# Patient Record
Sex: Female | Born: 1970 | Race: Black or African American | Hispanic: No | Marital: Single | Smoking: Never smoker
Health system: Southern US, Community
[De-identification: ages and names within clinical notes are randomized; demographics above are authoritative.]

## PROBLEM LIST (undated history)

## (undated) DIAGNOSIS — E559 Vitamin D deficiency, unspecified: Secondary | ICD-10-CM

## (undated) DIAGNOSIS — R0600 Dyspnea, unspecified: Secondary | ICD-10-CM

## (undated) DIAGNOSIS — M1990 Unspecified osteoarthritis, unspecified site: Secondary | ICD-10-CM

## (undated) DIAGNOSIS — R7303 Prediabetes: Secondary | ICD-10-CM

## (undated) DIAGNOSIS — M2550 Pain in unspecified joint: Secondary | ICD-10-CM

## (undated) DIAGNOSIS — E538 Deficiency of other specified B group vitamins: Secondary | ICD-10-CM

## (undated) DIAGNOSIS — M797 Fibromyalgia: Secondary | ICD-10-CM

## (undated) DIAGNOSIS — T7840XA Allergy, unspecified, initial encounter: Secondary | ICD-10-CM

## (undated) DIAGNOSIS — E669 Obesity, unspecified: Secondary | ICD-10-CM

## (undated) DIAGNOSIS — D509 Iron deficiency anemia, unspecified: Secondary | ICD-10-CM

## (undated) DIAGNOSIS — G5710 Meralgia paresthetica, unspecified lower limb: Secondary | ICD-10-CM

## (undated) DIAGNOSIS — D259 Leiomyoma of uterus, unspecified: Secondary | ICD-10-CM

## (undated) DIAGNOSIS — D649 Anemia, unspecified: Secondary | ICD-10-CM

## (undated) DIAGNOSIS — M549 Dorsalgia, unspecified: Secondary | ICD-10-CM

## (undated) DIAGNOSIS — F419 Anxiety disorder, unspecified: Secondary | ICD-10-CM

## (undated) DIAGNOSIS — K529 Noninfective gastroenteritis and colitis, unspecified: Secondary | ICD-10-CM

## (undated) DIAGNOSIS — K259 Gastric ulcer, unspecified as acute or chronic, without hemorrhage or perforation: Secondary | ICD-10-CM

## (undated) DIAGNOSIS — R110 Nausea: Secondary | ICD-10-CM

## (undated) DIAGNOSIS — E739 Lactose intolerance, unspecified: Secondary | ICD-10-CM

## (undated) DIAGNOSIS — K429 Umbilical hernia without obstruction or gangrene: Secondary | ICD-10-CM

## (undated) DIAGNOSIS — I10 Essential (primary) hypertension: Secondary | ICD-10-CM

## (undated) HISTORY — DX: Gastric ulcer, unspecified as acute or chronic, without hemorrhage or perforation: K25.9

## (undated) HISTORY — DX: Deficiency of other specified B group vitamins: E53.8

## (undated) HISTORY — DX: Leiomyoma of uterus, unspecified: D25.9

## (undated) HISTORY — DX: Umbilical hernia without obstruction or gangrene: K42.9

## (undated) HISTORY — DX: Obesity, unspecified: E66.9

## (undated) HISTORY — DX: Vitamin D deficiency, unspecified: E55.9

## (undated) HISTORY — DX: Lactose intolerance, unspecified: E73.9

## (undated) HISTORY — DX: Anxiety disorder, unspecified: F41.9

## (undated) HISTORY — DX: Essential (primary) hypertension: I10

## (undated) HISTORY — DX: Pain in unspecified joint: M25.50

## (undated) HISTORY — DX: Iron deficiency anemia, unspecified: D50.9

## (undated) HISTORY — DX: Noninfective gastroenteritis and colitis, unspecified: K52.9

## (undated) HISTORY — DX: Fibromyalgia: M79.7

## (undated) HISTORY — DX: Anemia, unspecified: D64.9

## (undated) HISTORY — DX: Nausea: R11.0

## (undated) HISTORY — PX: ESOPHAGOGASTRODUODENOSCOPY ENDOSCOPY: SHX5814

## (undated) HISTORY — DX: Allergy, unspecified, initial encounter: T78.40XA

## (undated) HISTORY — DX: Dorsalgia, unspecified: M54.9

## (undated) HISTORY — PX: BREAST REDUCTION SURGERY: SHX8

## (undated) HISTORY — DX: Meralgia paresthetica, unspecified lower limb: G57.10

## (undated) HISTORY — PX: COLONOSCOPY: SHX174

---

## 2002-08-04 HISTORY — PX: MYOMECTOMY: SHX85

## 2005-11-15 ENCOUNTER — Emergency Department (HOSPITAL_COMMUNITY): Admission: EM | Admit: 2005-11-15 | Discharge: 2005-11-15 | Payer: Self-pay | Admitting: Family Medicine

## 2006-01-13 ENCOUNTER — Observation Stay (HOSPITAL_COMMUNITY): Admission: EM | Admit: 2006-01-13 | Discharge: 2006-01-14 | Payer: Self-pay | Admitting: Emergency Medicine

## 2006-02-02 ENCOUNTER — Encounter: Payer: Self-pay | Admitting: Internal Medicine

## 2006-04-17 ENCOUNTER — Ambulatory Visit: Payer: Self-pay | Admitting: Internal Medicine

## 2006-05-29 ENCOUNTER — Ambulatory Visit: Payer: Self-pay | Admitting: Internal Medicine

## 2006-07-15 ENCOUNTER — Ambulatory Visit: Payer: Self-pay | Admitting: Internal Medicine

## 2006-08-05 ENCOUNTER — Ambulatory Visit: Payer: Self-pay | Admitting: Internal Medicine

## 2006-09-16 ENCOUNTER — Ambulatory Visit: Payer: Self-pay | Admitting: Internal Medicine

## 2006-11-03 ENCOUNTER — Ambulatory Visit: Payer: Self-pay | Admitting: Internal Medicine

## 2006-11-03 LAB — CONVERTED CEMR LAB
BUN: 4 mg/dL — ABNORMAL LOW (ref 6–23)
Basophils Relative: 0.8 % (ref 0.0–1.0)
Chloride: 105 meq/L (ref 96–112)
Eosinophils Relative: 2.7 % (ref 0.0–5.0)
GFR calc Af Amer: 180 mL/min
HCT: 20.6 % — CL (ref 36.0–46.0)
MCHC: 31.5 g/dL (ref 30.0–36.0)
MCV: 64.4 fL — ABNORMAL LOW (ref 78.0–100.0)
Monocytes Relative: 9.5 % (ref 3.0–11.0)
Neutrophils Relative %: 56.6 % (ref 43.0–77.0)
Platelets: 288 10*3/uL (ref 150–400)
RBC: 3.2 M/uL — ABNORMAL LOW (ref 3.87–5.11)
RDW: 19.5 % — ABNORMAL HIGH (ref 11.5–14.6)
Saturation Ratios: 2.5 % — ABNORMAL LOW (ref 20.0–50.0)
Transferrin: 319.8 mg/dL (ref >=212.0)

## 2006-11-06 ENCOUNTER — Observation Stay (HOSPITAL_COMMUNITY): Admission: EM | Admit: 2006-11-06 | Discharge: 2006-11-07 | Payer: Self-pay | Admitting: Internal Medicine

## 2006-11-20 ENCOUNTER — Ambulatory Visit: Payer: Self-pay | Admitting: Internal Medicine

## 2006-11-20 LAB — CONVERTED CEMR LAB
Eosinophils Absolute: 0.1 10*3/uL (ref 0.0–0.6)
HCT: 31.3 % — ABNORMAL LOW (ref 36.0–46.0)
Hemoglobin: 10 g/dL — ABNORMAL LOW (ref 12.0–15.0)
Iron: 31 ug/dL — ABNORMAL LOW (ref 42–145)
Lymphocytes Relative: 29.7 % (ref 12.0–46.0)
Monocytes Relative: 7.5 % (ref 3.0–11.0)
Neutro Abs: 3.5 10*3/uL (ref 1.4–7.7)
Platelets: 226 10*3/uL (ref 150–400)
RBC: 4.55 M/uL (ref 3.87–5.11)
RDW: 26.7 % — ABNORMAL HIGH (ref 11.5–14.6)
Vit D, 1,25-Dihydroxy: 13 — ABNORMAL LOW (ref 20–57)

## 2006-12-18 ENCOUNTER — Ambulatory Visit: Payer: Self-pay | Admitting: Internal Medicine

## 2006-12-18 LAB — CONVERTED CEMR LAB
Basophils Absolute: 0.1 10*3/uL (ref 0.0–0.1)
Basophils Relative: 0.9 % (ref 0.0–1.0)
Crystals: NEGATIVE
Eosinophils Absolute: 0.2 10*3/uL (ref 0.0–0.6)
Eosinophils Relative: 2.6 % (ref 0.0–5.0)
Hemoglobin: 9.6 g/dL — ABNORMAL LOW (ref 12.0–15.0)
Iron: 47 ug/dL (ref 42–145)
Ketones, ur: 40 mg/dL — AB
Lymphocytes Relative: 22 % (ref 12.0–46.0)
MCHC: 32 g/dL (ref 30.0–36.0)
MCV: 68.4 fL — ABNORMAL LOW (ref 78.0–100.0)
Monocytes Relative: 7.2 % (ref 3.0–11.0)
Neutro Abs: 4.3 10*3/uL (ref 1.4–7.7)
Platelets: 160 10*3/uL (ref 150–400)
RDW: 27.6 % — ABNORMAL HIGH (ref 11.5–14.6)
Transferrin: 305.3 mg/dL (ref >=212.0)
WBC: 6.6 10*3/uL (ref 4.5–10.5)
pH: 6.5 (ref 5.0–8.0)

## 2007-01-19 ENCOUNTER — Ambulatory Visit: Payer: Self-pay | Admitting: Internal Medicine

## 2007-01-19 LAB — CONVERTED CEMR LAB
BUN: 7 mg/dL (ref 6–23)
Basophils Absolute: 0 10*3/uL (ref 0.0–0.1)
Basophils Relative: 0.5 % (ref 0.0–1.0)
Calcium: 8.9 mg/dL (ref 8.4–10.5)
Chloride: 106 meq/L (ref 96–112)
Creatinine, Ser: 0.5 mg/dL (ref 0.4–1.2)
Eosinophils Relative: 1.3 % (ref 0.0–5.0)
GFR calc Af Amer: 180 mL/min
Hemoglobin: 6.9 g/dL — CL (ref 12.0–15.0)
Iron: 23 ug/dL — ABNORMAL LOW (ref 42–145)
MCV: 68.4 fL — ABNORMAL LOW (ref 78.0–100.0)
Monocytes Absolute: 0.6 10*3/uL (ref 0.2–0.7)
Monocytes Relative: 8.9 % (ref 3.0–11.0)
Neutro Abs: 4.1 10*3/uL (ref 1.4–7.7)
Neutrophils Relative %: 65.2 % (ref 43.0–77.0)
RBC: 3.12 M/uL — ABNORMAL LOW (ref 3.87–5.11)
Transferrin: 298.2 mg/dL (ref >=212.0)
Vitamin B-12: 466 pg/mL (ref 211–911)
WBC: 6.4 10*3/uL (ref 4.5–10.5)

## 2007-01-22 ENCOUNTER — Ambulatory Visit: Payer: Self-pay | Admitting: Internal Medicine

## 2007-01-22 ENCOUNTER — Observation Stay (HOSPITAL_COMMUNITY): Admission: EM | Admit: 2007-01-22 | Discharge: 2007-01-23 | Payer: Self-pay | Admitting: Internal Medicine

## 2007-01-28 ENCOUNTER — Ambulatory Visit: Payer: Self-pay | Admitting: Internal Medicine

## 2007-03-10 ENCOUNTER — Emergency Department (HOSPITAL_COMMUNITY): Admission: EM | Admit: 2007-03-10 | Discharge: 2007-03-11 | Payer: Self-pay | Admitting: Emergency Medicine

## 2007-03-13 ENCOUNTER — Encounter: Payer: Self-pay | Admitting: Internal Medicine

## 2007-03-13 DIAGNOSIS — D649 Anemia, unspecified: Secondary | ICD-10-CM | POA: Insufficient documentation

## 2007-03-13 DIAGNOSIS — D259 Leiomyoma of uterus, unspecified: Secondary | ICD-10-CM | POA: Insufficient documentation

## 2007-03-15 ENCOUNTER — Ambulatory Visit: Payer: Self-pay | Admitting: Internal Medicine

## 2007-03-15 LAB — CONVERTED CEMR LAB
MCHC: 32.9 g/dL (ref 30.0–36.0)
Monocytes Relative: 8.9 % (ref 3.0–11.0)
Prothrombin Time: 11.8 s (ref 10.0–14.0)
WBC: 6.6 10*3/uL (ref 4.5–10.5)

## 2007-03-29 ENCOUNTER — Ambulatory Visit: Payer: Self-pay | Admitting: Internal Medicine

## 2007-03-29 LAB — CONVERTED CEMR LAB
Basophils Absolute: 0 10*3/uL (ref 0.0–0.1)
Eosinophils Absolute: 0.1 10*3/uL (ref 0.0–0.6)
Hemoglobin: 9.9 g/dL — ABNORMAL LOW (ref 12.0–15.0)
Iron: 36 ug/dL — ABNORMAL LOW (ref 42–145)
MCHC: 32.6 g/dL (ref 30.0–36.0)
Monocytes Absolute: 0.3 10*3/uL (ref 0.2–0.7)
Monocytes Relative: 8.1 % (ref 3.0–11.0)
RBC: 4.15 M/uL (ref 3.87–5.11)
RDW: 19.4 % — ABNORMAL HIGH (ref 11.5–14.6)
Vitamin B-12: 414 pg/mL (ref 211–911)
WBC: 4.3 10*3/uL — ABNORMAL LOW (ref 4.5–10.5)

## 2007-06-11 ENCOUNTER — Ambulatory Visit: Payer: Self-pay | Admitting: Internal Medicine

## 2007-06-11 DIAGNOSIS — D509 Iron deficiency anemia, unspecified: Secondary | ICD-10-CM | POA: Insufficient documentation

## 2007-06-11 DIAGNOSIS — E538 Deficiency of other specified B group vitamins: Secondary | ICD-10-CM | POA: Insufficient documentation

## 2007-06-11 LAB — CONVERTED CEMR LAB
BUN: 4 mg/dL — ABNORMAL LOW (ref 6–23)
Basophils Relative: 1.2 % — ABNORMAL HIGH (ref 0.0–1.0)
Chloride: 106 meq/L (ref 96–112)
Creatinine, Ser: 0.5 mg/dL (ref 0.4–1.2)
Eosinophils Relative: 4 % (ref 0.0–5.0)
GFR calc Af Amer: 179 mL/min
Glucose, Bld: 79 mg/dL (ref 70–99)
HCT: 32 % — ABNORMAL LOW (ref 36.0–46.0)
Lymphocytes Relative: 40.1 % (ref 12.0–46.0)
MCHC: 31.7 g/dL (ref 30.0–36.0)
MCV: 70.2 fL — ABNORMAL LOW (ref 78.0–100.0)
Monocytes Absolute: 0.6 10*3/uL (ref 0.2–0.7)
Monocytes Relative: 10.7 % (ref 3.0–11.0)
RBC: 4.59 M/uL (ref 3.87–5.11)
RDW: 19.5 % — ABNORMAL HIGH (ref 11.5–14.6)
WBC: 5.2 10*3/uL (ref 4.5–10.5)

## 2007-06-15 ENCOUNTER — Telehealth: Payer: Self-pay | Admitting: Internal Medicine

## 2007-06-30 ENCOUNTER — Ambulatory Visit: Payer: Self-pay | Admitting: Internal Medicine

## 2007-06-30 DIAGNOSIS — E559 Vitamin D deficiency, unspecified: Secondary | ICD-10-CM | POA: Insufficient documentation

## 2007-06-30 DIAGNOSIS — R110 Nausea: Secondary | ICD-10-CM | POA: Insufficient documentation

## 2007-07-02 LAB — CONVERTED CEMR LAB
ALT: 13 units/L (ref 0–35)
Basophils Relative: 0.4 % (ref 0.0–1.0)
Eosinophils Relative: 2.2 % (ref 0.0–5.0)
HCT: 33.7 % — ABNORMAL LOW (ref 36.0–46.0)
Hemoglobin: 11.1 g/dL — ABNORMAL LOW (ref 12.0–15.0)
MCHC: 32.9 g/dL (ref 30.0–36.0)
Monocytes Relative: 8.2 % (ref 3.0–11.0)
Platelets: 177 10*3/uL (ref 150–400)
Total Bilirubin: 0.4 mg/dL (ref 0.3–1.2)
hCG, Beta Chain, Quant, S: <0.5 milliintl units/mL

## 2007-07-06 ENCOUNTER — Ambulatory Visit: Payer: Self-pay | Admitting: Pulmonary Disease

## 2007-07-07 ENCOUNTER — Encounter: Payer: Self-pay | Admitting: *Deleted

## 2007-07-12 ENCOUNTER — Ambulatory Visit: Payer: Self-pay | Admitting: Internal Medicine

## 2007-07-12 DIAGNOSIS — R0602 Shortness of breath: Secondary | ICD-10-CM | POA: Insufficient documentation

## 2007-07-12 DIAGNOSIS — R0600 Dyspnea, unspecified: Secondary | ICD-10-CM | POA: Insufficient documentation

## 2007-07-13 ENCOUNTER — Observation Stay (HOSPITAL_COMMUNITY): Admission: EM | Admit: 2007-07-13 | Discharge: 2007-07-14 | Payer: Self-pay | Admitting: Internal Medicine

## 2007-07-13 ENCOUNTER — Telehealth: Payer: Self-pay | Admitting: Internal Medicine

## 2007-07-13 ENCOUNTER — Encounter: Payer: Self-pay | Admitting: Internal Medicine

## 2007-07-13 LAB — CONVERTED CEMR LAB
Basophils Absolute: 0 10*3/uL (ref 0.0–0.1)
Eosinophils Absolute: 0.2 10*3/uL (ref 0.0–0.6)
Eosinophils Relative: 2.7 % (ref 0.0–5.0)
Hemoglobin: 6.9 g/dL — CL (ref 12.0–15.0)
Lymphocytes Relative: 35.7 % (ref 12.0–46.0)
MCHC: 31.9 g/dL (ref 30.0–36.0)
Neutro Abs: 3.3 10*3/uL (ref 1.4–7.7)
Platelets: 234 10*3/uL (ref 150–400)
RBC: 3.12 M/uL — ABNORMAL LOW (ref 3.87–5.11)
RDW: 22.5 % — ABNORMAL HIGH (ref 11.5–14.6)
WBC: 6.5 10*3/uL (ref 4.5–10.5)

## 2007-07-14 ENCOUNTER — Ambulatory Visit: Payer: Self-pay | Admitting: Internal Medicine

## 2007-07-15 ENCOUNTER — Encounter: Payer: Self-pay | Admitting: Internal Medicine

## 2007-07-15 ENCOUNTER — Telehealth (INDEPENDENT_AMBULATORY_CARE_PROVIDER_SITE_OTHER): Payer: Self-pay | Admitting: *Deleted

## 2007-07-15 ENCOUNTER — Telehealth: Payer: Self-pay | Admitting: Internal Medicine

## 2007-07-21 ENCOUNTER — Encounter: Payer: Self-pay | Admitting: Internal Medicine

## 2007-08-05 HISTORY — PX: OTHER SURGICAL HISTORY: SHX169

## 2007-09-19 ENCOUNTER — Encounter: Payer: Self-pay | Admitting: Internal Medicine

## 2007-11-01 ENCOUNTER — Ambulatory Visit: Payer: Self-pay | Admitting: Internal Medicine

## 2007-11-03 LAB — CONVERTED CEMR LAB
Basophils Relative: 1.3 % — ABNORMAL HIGH (ref 0.0–1.0)
CO2: 30 meq/L (ref 19–32)
Calcium: 9.1 mg/dL (ref 8.4–10.5)
Chloride: 105 meq/L (ref 96–112)
Creatinine, Ser: 0.5 mg/dL (ref 0.4–1.2)
GFR calc non Af Amer: 148 mL/min
HCT: 33.9 % — ABNORMAL LOW (ref 36.0–46.0)
Iron: 189 ug/dL — ABNORMAL HIGH (ref 42–145)
MCHC: 32 g/dL (ref 30.0–36.0)
Monocytes Relative: 7.1 % (ref 3.0–12.0)
Neutrophils Relative %: 66.3 % (ref 43.0–77.0)
Platelets: 221 10*3/uL (ref 150–400)
Potassium: 3.6 meq/L (ref 3.5–5.1)
RBC: 4.54 M/uL (ref 3.87–5.11)
Sodium: 140 meq/L (ref 135–145)
TSH: 0.12 microintl units/mL — ABNORMAL LOW (ref 0.35–5.50)
Vitamin B-12: 431 pg/mL (ref 211–911)

## 2007-11-04 ENCOUNTER — Encounter: Payer: Self-pay | Admitting: Internal Medicine

## 2007-11-04 LAB — CONVERTED CEMR LAB: Vit D, 1,25-Dihydroxy: 6 — ABNORMAL LOW (ref 30–89)

## 2007-12-03 ENCOUNTER — Encounter: Payer: Self-pay | Admitting: Internal Medicine

## 2007-12-15 ENCOUNTER — Ambulatory Visit: Payer: Self-pay | Admitting: Internal Medicine

## 2007-12-15 LAB — CONVERTED CEMR LAB
Basophils Relative: 0.6 % (ref 0.0–1.0)
Hemoglobin: 12.1 g/dL (ref 12.0–15.0)
Lymphocytes Relative: 36.7 % (ref 12.0–46.0)
MCHC: 32.6 g/dL (ref 30.0–36.0)
MCV: 74.8 fL — ABNORMAL LOW (ref 78.0–100.0)
Monocytes Absolute: 0.3 10*3/uL (ref 0.1–1.0)
Monocytes Relative: 5.7 % (ref 3.0–12.0)
Neutrophils Relative %: 52.2 % (ref 43.0–77.0)
Platelets: 181 10*3/uL (ref 150–400)
RBC: 4.96 M/uL (ref 3.87–5.11)
RDW: 20.2 % — ABNORMAL HIGH (ref 11.5–14.6)
TSH: 0.54 microintl units/mL (ref 0.35–5.50)
WBC: 5.8 10*3/uL (ref 4.5–10.5)

## 2007-12-17 ENCOUNTER — Ambulatory Visit: Payer: Self-pay | Admitting: Internal Medicine

## 2007-12-17 DIAGNOSIS — J309 Allergic rhinitis, unspecified: Secondary | ICD-10-CM | POA: Insufficient documentation

## 2007-12-17 DIAGNOSIS — I10 Essential (primary) hypertension: Secondary | ICD-10-CM | POA: Insufficient documentation

## 2008-01-14 ENCOUNTER — Ambulatory Visit: Payer: Self-pay | Admitting: Internal Medicine

## 2008-01-14 DIAGNOSIS — R209 Unspecified disturbances of skin sensation: Secondary | ICD-10-CM | POA: Insufficient documentation

## 2008-01-14 DIAGNOSIS — M79609 Pain in unspecified limb: Secondary | ICD-10-CM | POA: Insufficient documentation

## 2008-01-28 ENCOUNTER — Ambulatory Visit: Payer: Self-pay

## 2008-04-17 ENCOUNTER — Ambulatory Visit: Payer: Self-pay | Admitting: Internal Medicine

## 2008-04-17 DIAGNOSIS — L980 Pyogenic granuloma: Secondary | ICD-10-CM | POA: Insufficient documentation

## 2008-04-28 ENCOUNTER — Encounter: Payer: Self-pay | Admitting: Internal Medicine

## 2008-04-28 ENCOUNTER — Ambulatory Visit: Payer: Self-pay | Admitting: Internal Medicine

## 2008-06-05 ENCOUNTER — Ambulatory Visit: Payer: Self-pay | Admitting: Internal Medicine

## 2008-06-05 DIAGNOSIS — J209 Acute bronchitis, unspecified: Secondary | ICD-10-CM

## 2008-06-05 DIAGNOSIS — J45909 Unspecified asthma, uncomplicated: Secondary | ICD-10-CM | POA: Insufficient documentation

## 2008-06-23 ENCOUNTER — Ambulatory Visit: Payer: Self-pay | Admitting: Internal Medicine

## 2008-06-23 LAB — CONVERTED CEMR LAB
CO2: 29 meq/L (ref 19–32)
Calcium: 9.2 mg/dL (ref 8.4–10.5)
Glucose, Bld: 98 mg/dL (ref 70–99)
Monocytes Relative: 7.3 % (ref 3.0–12.0)
Neutrophils Relative %: 54.7 % (ref 43.0–77.0)
Platelets: 151 10*3/uL (ref 150–400)
RBC: 4.6 M/uL (ref 3.87–5.11)
RDW: 14.2 % (ref 11.5–14.6)
Saturation Ratios: 10.6 % — ABNORMAL LOW (ref 20.0–50.0)
TSH: 0.47 microintl units/mL (ref 0.35–5.50)
Transferrin: 256.6 mg/dL (ref >=212.0)

## 2008-08-04 DIAGNOSIS — A6000 Herpesviral infection of urogenital system, unspecified: Secondary | ICD-10-CM | POA: Insufficient documentation

## 2008-11-10 ENCOUNTER — Ambulatory Visit: Payer: Self-pay | Admitting: Internal Medicine

## 2008-11-13 LAB — CONVERTED CEMR LAB
ALT: 12 units/L (ref 0–35)
AST: 16 units/L (ref 0–37)
Albumin: 3.6 g/dL (ref 3.5–5.2)
Bilirubin, Direct: 0.1 mg/dL (ref 0.0–0.3)
Calcium: 8.9 mg/dL (ref 8.4–10.5)
Chloride: 106 meq/L (ref 96–112)
Creatinine, Ser: 0.5 mg/dL (ref 0.4–1.2)
Eosinophils Relative: 4.1 % (ref 0.0–5.0)
Folate: 10.2 ng/mL
GFR calc non Af Amer: 176.87 mL/min (ref >60)
Ketones, ur: NEGATIVE mg/dL
Lymphocytes Relative: 34.7 % (ref 12.0–46.0)
Neutrophils Relative %: 51.7 % (ref 43.0–77.0)
RBC: 4.38 M/uL (ref 3.87–5.11)
RDW: 14.5 % (ref 11.5–14.6)
Sodium: 142 meq/L (ref 135–145)
Specific Gravity, Urine: 1.02 (ref 1.000–1.030)
TSH: 0.52 microintl units/mL (ref 0.35–5.50)
Total Bilirubin: 0.6 mg/dL (ref 0.3–1.2)
Total Protein, Urine: NEGATIVE mg/dL
Urine Glucose: NEGATIVE mg/dL
WBC: 4.9 10*3/uL (ref 4.5–10.5)

## 2008-12-29 ENCOUNTER — Ambulatory Visit: Payer: Self-pay | Admitting: Internal Medicine

## 2009-04-06 ENCOUNTER — Ambulatory Visit: Payer: Self-pay | Admitting: Internal Medicine

## 2009-04-06 DIAGNOSIS — L258 Unspecified contact dermatitis due to other agents: Secondary | ICD-10-CM | POA: Insufficient documentation

## 2009-06-15 ENCOUNTER — Ambulatory Visit: Payer: Self-pay | Admitting: Internal Medicine

## 2009-06-15 DIAGNOSIS — N3000 Acute cystitis without hematuria: Secondary | ICD-10-CM | POA: Insufficient documentation

## 2009-06-18 ENCOUNTER — Telehealth: Payer: Self-pay | Admitting: Internal Medicine

## 2009-06-18 LAB — CONVERTED CEMR LAB
Ketones, ur: NEGATIVE mg/dL
Nitrite: NEGATIVE
Urine Glucose: NEGATIVE mg/dL
Urobilinogen, UA: 0.2 (ref 0.0–1.0)
pH: 6 (ref 5.0–8.0)

## 2009-10-05 ENCOUNTER — Ambulatory Visit: Payer: Self-pay | Admitting: Internal Medicine

## 2009-10-05 DIAGNOSIS — L659 Nonscarring hair loss, unspecified: Secondary | ICD-10-CM | POA: Insufficient documentation

## 2009-10-08 ENCOUNTER — Telehealth: Payer: Self-pay | Admitting: Internal Medicine

## 2009-10-08 LAB — CONVERTED CEMR LAB
ALT: 13 units/L (ref 0–35)
AST: 21 units/L (ref 0–37)
Alkaline Phosphatase: 48 units/L (ref 39–117)
Basophils Relative: 0.7 % (ref 0.0–3.0)
Bilirubin, Direct: 0.1 mg/dL (ref 0.0–0.3)
CO2: 31 meq/L (ref 19–32)
Calcium: 9.3 mg/dL (ref 8.4–10.5)
Chloride: 105 meq/L (ref 96–112)
Eosinophils Relative: 3 % (ref 0.0–5.0)
Folate: 16.1 ng/mL
Glucose, Bld: 114 mg/dL — ABNORMAL HIGH (ref 70–99)
Hemoglobin: 12.9 g/dL (ref 12.0–15.0)
Leukocytes, UA: NEGATIVE
Lymphs Abs: 1.5 10*3/uL (ref 0.7–4.0)
MCHC: 31.8 g/dL (ref 30.0–36.0)
Neutro Abs: 3 10*3/uL (ref 1.4–7.7)
Neutrophils Relative %: 58.8 % (ref 43.0–77.0)
Nitrite: NEGATIVE
Platelets: 108 10*3/uL — ABNORMAL LOW (ref 150.0–400.0)
RBC: 4.81 M/uL (ref 3.87–5.11)
Sodium: 141 meq/L (ref 135–145)
Specific Gravity, Urine: >=1.03 (ref 1.000–1.030)
Total Bilirubin: 0.3 mg/dL (ref 0.3–1.2)
Total Protein: 7.1 g/dL (ref 6.0–8.3)
WBC: 4.8 10*3/uL (ref 4.5–10.5)
pH: 6 (ref 5.0–8.0)

## 2009-10-09 ENCOUNTER — Encounter: Payer: Self-pay | Admitting: Internal Medicine

## 2009-10-11 ENCOUNTER — Encounter: Payer: Self-pay | Admitting: Internal Medicine

## 2009-10-11 ENCOUNTER — Telehealth: Payer: Self-pay | Admitting: Internal Medicine

## 2009-10-14 ENCOUNTER — Encounter: Payer: Self-pay | Admitting: Internal Medicine

## 2009-10-29 ENCOUNTER — Encounter: Payer: Self-pay | Admitting: Internal Medicine

## 2009-12-02 ENCOUNTER — Encounter: Payer: Self-pay | Admitting: Internal Medicine

## 2009-12-02 LAB — CONVERTED CEMR LAB: Pap Smear: NORMAL

## 2009-12-18 ENCOUNTER — Ambulatory Visit: Payer: Self-pay | Admitting: Internal Medicine

## 2009-12-18 DIAGNOSIS — G5710 Meralgia paresthetica, unspecified lower limb: Secondary | ICD-10-CM | POA: Insufficient documentation

## 2010-01-02 ENCOUNTER — Telehealth: Payer: Self-pay | Admitting: Internal Medicine

## 2010-01-02 LAB — HM MAMMOGRAPHY: HM Mammogram: NORMAL

## 2010-01-18 ENCOUNTER — Ambulatory Visit: Payer: Self-pay | Admitting: Internal Medicine

## 2010-01-18 DIAGNOSIS — M545 Low back pain, unspecified: Secondary | ICD-10-CM | POA: Insufficient documentation

## 2010-01-18 DIAGNOSIS — M2550 Pain in unspecified joint: Secondary | ICD-10-CM | POA: Insufficient documentation

## 2010-01-18 LAB — CONVERTED CEMR LAB
Ketones, ur: NEGATIVE mg/dL
Nitrite: NEGATIVE
Specific Gravity, Urine: 1.025 (ref 1.000–1.030)
Total Protein, Urine: NEGATIVE mg/dL
Urine Glucose: NEGATIVE mg/dL

## 2010-02-12 ENCOUNTER — Telehealth: Payer: Self-pay | Admitting: Internal Medicine

## 2010-03-04 ENCOUNTER — Ambulatory Visit: Payer: Self-pay | Admitting: Internal Medicine

## 2010-03-04 DIAGNOSIS — R7309 Other abnormal glucose: Secondary | ICD-10-CM | POA: Insufficient documentation

## 2010-03-04 LAB — CONVERTED CEMR LAB
ALT: 12 units/L (ref 0–35)
ANA Titer 1: NEGATIVE
AST: 18 units/L (ref 0–37)
Alkaline Phosphatase: 34 units/L — ABNORMAL LOW (ref 39–117)
BUN: 15 mg/dL (ref 6–23)
Basophils Absolute: 0 10*3/uL (ref 0.0–0.1)
Bilirubin Urine: NEGATIVE
Bilirubin, Direct: 0.1 mg/dL (ref 0.0–0.3)
Calcium: 9.4 mg/dL (ref 8.4–10.5)
Chloride: 103 meq/L (ref 96–112)
Eosinophils Absolute: 0.1 10*3/uL (ref 0.0–0.7)
Eosinophils Relative: 1.4 % (ref 0.0–5.0)
Glucose, Bld: 99 mg/dL (ref 70–99)
Hemoglobin, Urine: NEGATIVE
Hemoglobin: 13.1 g/dL (ref 12.0–15.0)
Ketones, ur: NEGATIVE mg/dL
Leukocytes, UA: NEGATIVE
Lymphocytes Relative: 26 % (ref 12.0–46.0)
Monocytes Absolute: 0.3 10*3/uL (ref 0.1–1.0)
Platelets: 143 10*3/uL — ABNORMAL LOW (ref 150.0–400.0)
Potassium: 4 meq/L (ref 3.5–5.1)
RBC: 4.71 M/uL (ref 3.87–5.11)
RDW: 14.6 % (ref 11.5–14.6)
Rhuematoid fact SerPl-aCnc: <20 intl units/mL (ref 0–20)
Sed Rate: 71 mm/hr — ABNORMAL HIGH (ref 0–22)
Sodium: 141 meq/L (ref 135–145)
Specific Gravity, Urine: 1.02 (ref 1.000–1.030)
Total Bilirubin: 0.6 mg/dL (ref 0.3–1.2)
Total Protein, Urine: NEGATIVE mg/dL
Total Protein: 7.5 g/dL (ref 6.0–8.3)

## 2010-03-07 ENCOUNTER — Ambulatory Visit: Payer: Self-pay | Admitting: Internal Medicine

## 2010-03-07 ENCOUNTER — Ambulatory Visit (HOSPITAL_COMMUNITY): Admission: RE | Admit: 2010-03-07 | Discharge: 2010-03-07 | Payer: Self-pay | Admitting: Internal Medicine

## 2010-03-18 ENCOUNTER — Ambulatory Visit: Payer: Self-pay | Admitting: Internal Medicine

## 2010-04-01 ENCOUNTER — Encounter: Payer: Self-pay | Admitting: Internal Medicine

## 2010-09-03 NOTE — Progress Notes (Signed)
Summary: REQ FOR RX  Phone Note Call from Patient Call back at [facility] Phone [phone]   Summary of Call: Pt continues to c/o leg pain, achy & burning, especially at night. She is req rx to [facility] to [facility].  Initial call taken by: Lamar Sprinkles, CMA,  January 02, 2010 10:00 AM  Follow-up for Phone Call        OK Gabapentin to try - warn pls - can make you sleepy Follow-up by: Tresa Garter MD,  January 02, 2010 5:51 PM  Additional Follow-up for Phone Call Additional follow up Details #1::        Left vm on hm # Additional Follow-up by: Lamar Sprinkles, CMA,  January 02, 2010 6:06 PM    New/Updated Medications: GABAPENTIN 100 MG CAPS (GABAPENTIN) 1-2 by mouth at bedtime as needed numbness Prescriptions: GABAPENTIN 100 MG CAPS (GABAPENTIN) 1-2 by mouth at bedtime as needed numbness  #60 x 2   Entered and Authorized by:   Tresa Garter MD   Signed by:   Lamar Sprinkles, CMA on 01/02/2010   Method used:   Electronically to        [facility] # [phone]* (retail)       [address] [place], [place]  [address]       Ph: [phone]       Fax: [phone]   RxID:   [id number]

## 2010-09-03 NOTE — Letter (Signed)
Summary: Generic Letter  [facility]  [address] [place], [place] [address]   Phone: [phone]  Fax: [phone]    03/18/2010  RE: [name] [address] [place], [place]  [address]  Ms. Nims can go back to work on 8/16. She needs to work 6 hr shift x 2 weeks then full time. She is not able to work overtime due to medical reasons.  Thank you!           Sincerely,   Jacinta Shoe MD

## 2010-09-03 NOTE — Progress Notes (Signed)
Summary: nuvigil denied  Phone Note From Pharmacy   Summary of Call: [name] has been denied, the patient does not have diagnosis of Narcolepsy, obstructive sleep apnea, nor shift work sleep disorder. If we would like to submit an appeal it must be done in writing. Please advise. Initial call taken by: Lucious Groves,  October 11, 2009 2:37 PM  Follow-up for Phone Call        Done. Thx Follow-up by: Tresa Garter MD,  October 14, 2009 10:42 PM  Additional Follow-up for Phone Call Additional follow up Details #1::        mailed. Additional Follow-up by: Lucious Groves,  October 15, 2009 4:48 PM     Appended Document: nuvigil denied Now approved until 2012.

## 2010-09-03 NOTE — Medication Information (Signed)
Summary: Nuvigil DENIED/[facility]  Nuvigil DENIED/[facility]   Imported By: Sherian Rein 10/15/2009 09:30:32  _____________________________________________________________________  External Attachment:    Type:   Image     Comment:   External Document

## 2010-09-03 NOTE — Assessment & Plan Note (Signed)
Summary: FRONT OF BOTH LEGS NUMBNESS--STC   Vital Signs:  Patient profile:   40 year old female Height:      63 inches Weight:      209 pounds BMI:     37.16 O2 Sat:      98 % on Room air Temp:     97.6 degrees F oral Pulse rate:   86 / minute BP sitting:   120 / 64  (left arm) Cuff size:   regular  Vitals Entered By: Lucious Groves (Dec 18, 2009 4:13 PM)  O2 Flow:  Room air CC: C/O bilateral thigh numbness since Thursday./kb Is Patient Diabetic? No Pain Assessment Patient in pain? no        CC:  C/O bilateral thigh numbness since Thursday./kb.  History of Present Illness: C/o numbness B thighs x long time, much worse x 1 wk. She is very worried. No injuries. No other neurol symptoms    Current Medications (verified): 1)  Ferrex 150 150 Mg  Caps (Polysaccharide Iron Complex) .... Three Times A Day 2)  Vitamin B-12 1000 Mcg  Tabs (Cyanocobalamin) .Marland Kitchen.. 1 Qd 3)  Vitamin D3 1000 Unit  Tabs (Cholecalciferol) .Marland Kitchen.. 1 Qd 4)  Vitamin C 200mg  .... Two Times A Day 5)  Folic Acid 400 Mcs Daily 6)  Loratadine 10 Mg  Tabs (Loratadine) .... Once Daily As Needed Allergies 7)  Triamcinolone Acetonide 0.5 % Crea (Triamcinolone Acetonide) .... Apply Qid-Bid To Affected Area 8)  Lotrisone 1-0.05 % Crea (Clotrimazole-Betamethasone) .... Use Bid 9)  Tribenzor 40-10-25 Mg Tabs (Olmesartan-Amlodipine-Hctz) .Marland Kitchen.. 1 By Mouth Once Daily For Blood Pressure 10)  Nuvigil 150 Mg Tabs (Armodafinil) .Marland Kitchen.. 1 By Mouth Qam or 1 H Prior To Shift 11)  Prenatal Plus/iron 27-1 Mg Tabs (Prenatal Vit-Fe Fumarate-Fa) .Marland Kitchen.. 1 By Mouth Qd  Allergies (verified): No Known Drug Allergies  Past History:  Past Surgical History: Last updated: 11/01/2007 myomectomy Fibrioidectomy 2009  Family History: Last updated: 06/30/2007 Family History Hypertension  Social History: Last updated: 10/05/2009 Occupation: office; shift work Single Never Smoked  Past Medical History: ANEMIA-IRON DEFICIENCY  (ICD-280.9) FIBROIDS, UTERUS (ICD-218.9) VITAMIN D DEFICIENCY (ICD-268.9) NAUSEA ALONE (ICD-787.02) UNSPECIFIED IRON DEFICIENCY ANEMIA (ICD-280.9) B12 DEFICIENCY (ICD-266.2) ANEMIA-NOS (ICD-285.9) FIBROIDS, UTERUS (ICD-218.9) Allergic rhinitis Hypertension Obesity Asthma as a child M. paresthetica  Review of Systems  The patient denies fever, weight loss, weight gain, chest pain, prolonged cough, abdominal pain, muscle weakness, difficulty walking, and depression.    Physical Exam  General:  overweight-appearing.   Eyes:  No corneal or conjunctival inflammation noted. EOMI. Perrla. Nose:  External nasal examination shows no deformity or inflammation. Nasal mucosa are pink and moist without lesions or exudates. Mouth:  Oral mucosa and oropharynx without lesions or exudates.  Teeth in good repair. Lungs:  Normal respiratory effort, chest expands symmetrically. Lungs are clear to auscultation, no crackles or wheezes. Heart:  Normal rate and regular rhythm. S1 and S2 normal without gallop, murmur, click, rub or other extra sounds. Abdomen:  Bowel sounds positive,abdomen soft and non-tender without masses, organomegaly or hernias noted. Msk:  No deformity or scoliosis noted of thoracic or lumbar spine.   Neurologic:  Large B thighs anterolat. oval shaped areas w/decreased sensationcranial nerves II-XII intact, gait normal, DTRs symmetrical and normal, and Romberg negative.   Skin:  Intact without suspicious lesions or rashes Cervical Nodes:  No lymphadenopathy noted Psych:  Cognition and judgment appear intact. Alert and cooperative with normal attention span and concentration. 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 Three times a day 2)  Vitamin B-12 1000 Mcg Tabs (Cyanocobalamin) .Marland Kitchen.. 1 qd 3)  Vitamin D3 1000 Unit Tabs (Cholecalciferol) .Marland Kitchen.. 1 qd 4)  Vitamin C 200mg   .... Two times a day 5)  Folic Acid 400 Mcs Daily  6)  Loratadine 10 Mg Tabs (Loratadine) .... Once daily as needed allergies 7)  Triamcinolone Acetonide 0.5 % Crea (Triamcinolone acetonide) .... Apply qid-bid to affected area 8)  Lotrisone 1-0.05 % Crea (Clotrimazole-betamethasone) .... Use bid 9)  Tribenzor 40-10-25 Mg Tabs (Olmesartan-amlodipine-hctz) .Marland Kitchen.. 1 by mouth once daily for blood pressure 10)  Nuvigil 150 Mg Tabs (Armodafinil) .Marland Kitchen.. 1 by mouth qam or 1 h prior to shift 11)  Prenatal Plus/iron 27-1 Mg Tabs (Prenatal vit-fe fumarate-fa) .Marland Kitchen.. 1 by mouth qd  Patient Instructions: 1)  Please schedule a follow-up appointment in 2 months. 2)  Call if you are not better in a reasonable amount of time or if worse

## 2010-09-03 NOTE — Letter (Signed)
Summary: Out of Work  [facility]  [address] [place], [place] [address]   Phone: [phone]  Fax: [phone]    March 04, 2010   Employee:  ANALYSSA DOWNS    To Whom It May Concern:   For Medical reasons, please excuse the above named employee from work for the following dates:  Start:   03/04/10  End:   03/18/10  If you need additional information, please feel free to contact our office.         Sincerely,    Tresa Garter MD

## 2010-09-03 NOTE — Progress Notes (Signed)
SummaryRonne Binning PA  Phone Note From Pharmacy   Caller: [facility] [phone] Call For: ID:  [id number]  Summary of Call: PA request--Nuvigil. Rep could not provide preferred alternatives, but states that he would fax form. Initial call taken by: Lucious Groves,  October 08, 2009 4:08 PM  Follow-up for Phone Call        form was signed and faxed. Follow-up by: Daphane Shepherd,  October 10, 2009 8:40 AM

## 2010-09-03 NOTE — Medication Information (Signed)
Summary: Prior Auth for Nuvigil/[employer]  Prior Auth for Nuvigil/[employer]   Imported By: Sherian Rein 10/11/2009 10:01:08  _____________________________________________________________________  External Attachment:    Type:   Image     Comment:   External Document

## 2010-09-03 NOTE — Assessment & Plan Note (Signed)
Summary: FATIGUE  STC   Vital Signs:  Patient profile:   40 year old female Weight:      212 pounds BMI:     37.69 Temp:     98.5 degrees F oral Pulse rate:   70 / minute BP sitting:   152 / 96  (left arm)  Vitals Entered By: Tora Perches (October 05, 2009 2:55 PM) CC: fatigue Is Patient Diabetic? No   CC:  fatigue.  History of Present Illness: The patient presents for a follow up of fatigue, anxiety, depression and anemia. C/o hair loss x 2-3 months   Preventive Screening-Counseling & Management  Alcohol-Tobacco     Smoking Status: never  Current Medications (verified): 1)  Ferrex 150 150 Mg  Caps (Polysaccharide Iron Complex) .... Three Times A Day 2)  Vitamin B-12 1000 Mcg  Tabs (Cyanocobalamin) .Marland Kitchen.. 1 Qd 3)  Vitamin D3 1000 Unit  Tabs (Cholecalciferol) .Marland Kitchen.. 1 Qd 4)  Vitamin C 200mg  .... Two Times A Day 5)  Folic Acid 400 Mcs Daily 6)  Loratadine 10 Mg  Tabs (Loratadine) .... Once Daily As Needed Allergies 7)  Fluticasone Propionate 50 Mcg/act  Susp (Fluticasone Propionate) .... 2 Sprays Each Nostril Once Daily 8)  Optivar 0.05 % Soln (Azelastine Hcl) .Marland Kitchen.. 1 Gtt Each Eye Two Times A Day Prn 9)  Voltaren 1 %  Gel (Diclofenac Sodium) .... Use Tid 10)  Labetalol Hcl 300 Mg  Tabs (Labetalol Hcl) .... Take 1 Tab By Mouth Tid 11)  Nifedipine 30 Mg Xr24h-Tab (Nifedipine) .Marland Kitchen.. 1 By Mouth Qd 12)  Triamcinolone Acetonide 0.5 % Crea (Triamcinolone Acetonide) .... Apply Qid-Bid To Affected Area 13)  Lotrisone 1-0.05 % Crea (Clotrimazole-Betamethasone) .... 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         Lost wt on diet  Physical Exam  General:  overweight-appearing.   Nose:  External nasal examination shows no deformity or inflammation. Nasal mucosa are pink and moist without lesions or exudates. Mouth:  Oral mucosa and oropharynx without lesions or exudates.  Teeth in good repair. Lungs:  Normal respiratory effort, chest expands symmetrically. Lungs are clear to auscultation, no crackles or wheezes. Heart:  Normal rate and regular rhythm. S1 and S2 normal without gallop, murmur, click, rub or other extra sounds. Abdomen:  Bowel sounds positive,abdomen soft and non-tender without masses, organomegaly or hernias noted. Msk:  No deformity or scoliosis noted of thoracic or lumbar spine.   Skin:  Intact without suspicious lesions or rashes Psych:  Cognition and judgment appear intact. Alert and cooperative with normal attention span and concentration. No apparent delusions, illusions, hallucinations   Impression & Recommendations:  Problem # 1:  ALOPECIA (ICD-704.00) poss due to BP meds Assessment New  Will change Rx  Orders: TLB-B12 + Folate Pnl ([address]_60[address]-U98/JXB) TLB-BMP (Basic Metabolic Panel-BMET) (80048-METABOL) TLB-Hepatic/Liver Function Pnl (80076-HEPATIC) TLB-Ferritin (82728-FER) TLB-CBC Platelet - w/Differential (85025-CBCD) TLB-TSH (Thyroid Stimulating Hormone) (84443-TSH) TLB-Udip ONLY (81003-UDIP)  Problem # 2:  HYPERTENSION (ICD-401.9) Assessment: Unchanged Go back on old meds if trying to get pregnant The following medications were removed from the medication list:    Labetalol Hcl 300 Mg Tabs (Labetalol  hcl) .Marland Kitchen... Take 1 tab by mouth tid    Nifedipine 30 Mg Xr24h-tab (Nifedipine) .Marland Kitchen... 1 by mouth qd Her updated medication list for this problem includes:    Tribenzor 40-10-25 Mg Tabs (Olmesartan-amlodipine-hctz) .Marland Kitchen... 1 by mouth once daily for blood pressure  Orders: TLB-B12 + Folate Pnl (82746_82607-B12/FOL) TLB-BMP (Basic Metabolic Panel-BMET) (80048-METABOL) TLB-Hepatic/Liver Function Pnl (80076-HEPATIC) TLB-Ferritin (82728-FER) TLB-CBC Platelet - w/Differential (85025-CBCD) TLB-TSH (Thyroid Stimulating Hormone) (84443-TSH) TLB-Udip ONLY (81003-UDIP)  Problem # 3:  FATIGUE (ICD-780.79) - shift work Assessment: Deteriorated  Nuvigyl to try  Orders: TLB-B12 + Folate Pnl (82746_82607-B12/FOL) TLB-BMP (Basic Metabolic Panel-BMET) (80048-METABOL) TLB-Hepatic/Liver Function Pnl (80076-HEPATIC) TLB-Ferritin (82728-FER) TLB-CBC Platelet - w/Differential (85025-CBCD) TLB-TSH (Thyroid Stimulating Hormone) (84443-TSH) TLB-Udip ONLY (81003-UDIP)  Problem # 4:  ANEMIA-IRON DEFICIENCY (ICD-280.9) Assessment: Improved  Her updated medication list for this problem includes:    Ferrex 150 150 Mg Caps (Polysaccharide iron complex) .Marland Kitchen... Three times a day    Vitamin B-12 1000 Mcg Tabs (Cyanocobalamin) .Marland Kitchen... 1 qd  Complete Medication List: 1)  Ferrex 150 150 Mg Caps (Polysaccharide iron complex) .... Three times a day 2)  Vitamin B-12 1000 Mcg Tabs (Cyanocobalamin) .Marland Kitchen.. 1 qd 3)  Vitamin D3 1000 Unit Tabs (Cholecalciferol) .Marland Kitchen.. 1 qd 4)  Vitamin C 200mg   .... Two times a day 5)  Folic Acid 400 Mcs Daily  6)  Loratadine 10 Mg Tabs (Loratadine) .... Once daily as needed allergies 7)  Fluticasone Propionate 50 Mcg/act Susp (Fluticasone propionate) .... 2 sprays each nostril once daily 8)  Optivar 0.05 % Soln (Azelastine hcl) .Marland Kitchen.. 1 gtt each eye two times a day prn 9)  Voltaren 1 % Gel (Diclofenac sodium) .... Use tid 10)  Triamcinolone Acetonide 0.5 % Crea (Triamcinolone acetonide) .... Apply  qid-bid to affected area 11)  Lotrisone 1-0.05 % Crea (Clotrimazole-betamethasone) .... Use bid 12)  Tribenzor 40-10-25 Mg Tabs (Olmesartan-amlodipine-hctz) .Marland Kitchen.. 1 by mouth once daily for blood pressure 13)  Nuvigil 150 Mg Tabs (Armodafinil) .Marland Kitchen.. 1 by mouth qam or 1 h prior to shift 14)  Prenatal Plus/iron 27-1 Mg Tabs (Prenatal vit-fe fumarate-fa) .Marland Kitchen.. 1 by mouth qd  Patient Instructions: 1)  Please schedule a follow-up appointment in 3 months. Prescriptions: PRENATAL PLUS/IRON 27-1 MG TABS (PRENATAL VIT-FE FUMARATE-FA) 1 by mouth qd  #90 x 3   Entered and Authorized by:   Tresa Garter MD   Signed by:   Tresa Garter MD on 10/05/2009   Method used:   Print then Mail to Patient   RxID:   [id number] NUVIGIL 150 MG TABS (ARMODAFINIL) 1 by mouth qam or 1 h prior to shift  #30 x 6   Entered and Authorized by:   Tresa Garter MD   Signed by:   Tresa Garter MD on 10/05/2009   Method used:   Print then Mail to Patient   RxID:   [id number] TRIBENZOR 40-10-25 MG TABS (OLMESARTAN-AMLODIPINE-HCTZ) 1 by mouth once daily for blood pressure  #90 x 3   Entered and Authorized by:   Tresa Garter MD   Signed by:   Tresa Garter MD on 10/05/2009   Method used:   Print then Mail to Patient   RxID:   [id number]

## 2010-09-03 NOTE — Letter (Signed)
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 Please approve Nuvigil.           Sincerely,   Jacinta Shoe MD

## 2010-09-03 NOTE — Assessment & Plan Note (Signed)
Summary: 6 WK ROV /NWS   Vital Signs:  Patient profile:   40 year old female Height:      63 inches Weight:      201 pounds BMI:     35.73 O2 Sat:      99 % on Room air Temp:     98.0 degrees F oral Pulse rate:   79 / minute Pulse rhythm:   regular Resp:     16 per minute BP sitting:   108 / 80  (left arm) Cuff size:   regular  Vitals Entered By: Lanier Prude, CMA(AAMA) (March 04, 2010 11:13 AM)  O2 Flow:  Room air CC: 6 wk f/u  Is Patient Diabetic? No Comments c/o off/on body aches and chills    CC:  6 wk f/u .  History of Present Illness: C/o alternating rare good and mostly  bad days of severe pain in the body, stiffness in hands, LBP is 9/10 and is irradiating down to R leg; cont to have numbness in B thighs. She has been working 48h/wk and 60h/wk alternating  Current Medications (verified): 1)  Ferrex 150 150 Mg  Caps (Polysaccharide Iron Complex) .... Three Times A Day 2)  Vitamin B-12 1000 Mcg  Tabs (Cyanocobalamin) .Marland Kitchen.. 1 Qd 3)  Vitamin D3 1000 Unit  Tabs (Cholecalciferol) .Marland Kitchen.. 1 Qd 4)  Vitamin C 200mg  .... Two Times A Day 5)  Folic Acid 400 Mcs Daily 6)  Loratadine 10 Mg  Tabs (Loratadine) .... Once Daily As Needed Allergies 7)  Triamcinolone Acetonide 0.5 % Crea (Triamcinolone Acetonide) .... Apply Qid-Bid To Affected Area 8)  Lotrisone 1-0.05 % Crea (Clotrimazole-Betamethasone) .... Use Bid 9)  Tribenzor 40-10-25 Mg Tabs (Olmesartan-Amlodipine-Hctz) .Marland Kitchen.. 1 By Mouth Once Daily For Blood Pressure 10)  Nuvigil 150 Mg Tabs (Armodafinil) .Marland Kitchen.. 1 By Mouth Qam or 1 H Prior To Shift 11)  Prenatal Plus/iron 27-1 Mg Tabs (Prenatal Vit-Fe Fumarate-Fa) .Marland Kitchen.. 1 By Mouth Qd 12)  Naproxen 500 Mg Tabs (Naproxen) .Marland Kitchen.. 1 By Mouth Two Times A Day Pc For Pain/arthritis 13)  Hydrocodone-Acetaminophen 5-325 Mg Tabs (Hydrocodone-Acetaminophen) .Marland Kitchen.. 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  The patient denies anorexia, weight loss, weight gain, chest pain, dyspnea on exertion, and abdominal pain.         LBP Lost wt on diet  Physical Exam  General:  overweight-appearing.   Head:  Normocephalic and atraumatic without obvious abnormalities. No apparent alopecia or balding. Eyes:  No corneal or conjunctival inflammation noted. EOMI. Perrla. Funduscopic exam benign, without hemorrhages, exudates or papilledema. Vision grossly normal. Ears:  External ear exam shows no significant lesions or deformities.  Otoscopic examination reveals clear canals, tympanic membranes are intact bilaterally without bulging, retraction, inflammation or discharge. Hearing is grossly normal bilaterally. Nose:  External nasal examination shows no deformity or inflammation. Nasal mucosa are pink and moist without lesions or exudates.  Mouth:  Oral mucosa and oropharynx without lesions or exudates.  Teeth in good repair. Neck:  No deformities, masses, or tenderness noted. Lungs:  Normal respiratory effort, chest expands symmetrically. Lungs are clear to auscultation, no crackles or wheezes. Heart:  Normal rate and regular rhythm. S1 and S2 normal without gallop, murmur, click, rub or other extra sounds. Abdomen:  Bowel sounds positive,abdomen  soft and non-tender without masses, organomegaly or hernias noted. Msk:  No deformity or scoliosis noted of thoracic or lumbar spine.   Lumbar-sacral spine is tender to palpation over paraspinal muscles and painfull with the ROM B hips NT w/ROM [place]rait leg elev +/- B  Pulses:  R and L carotid,radial,femoral,dorsalis pedis and posterior tibial pulses are full and equal bilaterally Extremities:  No edema B Neurologic:  Large B thighs anterolat. oval shaped areas w/decreased sensationcranial nerves II-XII intact, gait normal, DTRs symmetrical and normal, and Romberg negative.   Skin:  Intact without suspicious lesions or rashes Psych:  Cognition and judgment appear intact. Alert and cooperative with normal attention span and concentration. No apparent delusions, illusions, hallucinations   Impression & Recommendations:  Problem # 1:  ARTHRALGIA (ICD-719.40) - possible CTD Assessment Deteriorated  Orders: EKG w/ Interpretation (93000) T-Vitamin D (25-Hydroxy) (54098-11914) T-Antinuclear Antib (ANA) [phone]) T- * Misc. Laboratory test [phone]) TLB-A1C / Hgb A1C (Glycohemoglobin) (83036-A1C) TLB-BMP (Basic Metabolic Panel-BMET) (80048-METABOL) TLB-CBC Platelet - w/Differential (85025-CBCD) TLB-Hepatic/Liver Function Pnl (80076-HEPATIC) TLB-IBC Pnl (Iron/FE;Transferrin) (83550-IBC) TLB-Sedimentation Rate (ESR) (85652-ESR) TLB-TSH (Thyroid Stimulating Hormone) (84443-TSH) TLB-Udip ONLY (81003-UDIP)  Problem # 2:  LOW BACK PAIN, ACUTE (ICD-724.2) Assessment: Deteriorated Off work 2 wks Her updated medication list for this problem includes:    Naproxen 500 Mg Tabs (Naproxen) .Marland Kitchen... 1 by mouth two times a day pc for pain/arthritis    Hydrocodone-acetaminophen 5-325 Mg Tabs (Hydrocodone-acetaminophen) .Marland Kitchen... 1-2 by mouth two times a day as needed pain  Orders: EKG w/ Interpretation (93000) T-Vitamin D (25-Hydroxy) (46962-95284) T-Antinuclear Antib (ANA) ([id number]) TLB-A1C / Hgb A1C  (Glycohemoglobin) (83036-A1C) TLB-BMP (Basic Metabolic Panel-BMET) (80048-METABOL) TLB-CBC Platelet - w/Differential (85025-CBCD) TLB-Hepatic/Liver Function Pnl (80076-HEPATIC) TLB-IBC Pnl (Iron/FE;Transferrin) (83550-IBC) TLB-Sedimentation Rate (ESR) (85652-ESR) TLB-TSH (Thyroid Stimulating Hormone) (84443-TSH) TLB-Udip ONLY (81003-UDIP) Radiology Referral (Radiology)  Problem # 3:  FATIGUE (ICD-780.79) related to #1 Assessment: Deteriorated  Orders: EKG w/ Interpretation (93000) T-Vitamin D (25-Hydroxy) (25366-44034) T-Antinuclear Antib (ANA) [phone]) T- * Misc. 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 1 by mouth once daily for blood pressure  Problem # 7:  VITAMIN D DEFICIENCY (ICD-268.9) Assessment: Comment Only  Complete Medication List: 1)  Ferrex 150 150 Mg Caps (Polysaccharide iron complex) .... Three times a day 2)  Vitamin B-12 1000 Mcg Tabs (Cyanocobalamin) .Marland Kitchen.. 1 qd 3)  Vitamin D3 1000 Unit Tabs (Cholecalciferol) .Marland Kitchen.. 1 qd 4)  Vitamin C 200mg   .... Two times a day 5)  Folic Acid 400 Mcs Daily  6)  Loratadine 10 Mg Tabs (Loratadine) .... Once daily as needed allergies 7)  Triamcinolone Acetonide 0.5 % Crea (Triamcinolone  acetonide) .... Apply qid-bid to affected area 8)   Lotrisone 1-0.05 % Crea (Clotrimazole-betamethasone) .... Use bid 9)  Tribenzor 40-10-25 Mg Tabs (Olmesartan-amlodipine-hctz) .Marland Kitchen.. 1 by mouth once daily for blood pressure 10)  Nuvigil 150 Mg Tabs (Armodafinil) .Marland Kitchen.. 1 by mouth qam or 1 h prior to shift 11)  Prenatal Plus/iron 27-1 Mg Tabs (Prenatal vit-fe fumarate-fa) .Marland Kitchen.. 1 by mouth qd 12)  Naproxen 500 Mg Tabs (Naproxen) .Marland Kitchen.. 1 by mouth two times a day pc for pain/arthritis 13)  Hydrocodone-acetaminophen 5-325 Mg Tabs (Hydrocodone-acetaminophen) .Marland Kitchen.. 1-2 by mouth two times a day as needed pain 14)  Prednisone 10 Mg Tabs (Prednisone) .... Take 40mg  qd for 3 days, then 20 mg qd for 3 days, then 10mg  qd for 6 days, then stop. take pc.  Patient Instructions: 1)  Please schedule a follow-up appointment in 2 weeks. 2)  Use stretching and balance exercises that I have provided (15 min. or longer every day) Prescriptions: PREDNISONE 10 MG TABS (PREDNISONE) Take 40mg  qd for 3 days, then 20 mg qd for 3 days, then 10mg  qd for 6 days, then stop. Take pc.  #24 x 1   Entered and Authorized by:   Tresa Garter MD   Signed by:   Tresa Garter MD on 03/04/2010   Method used:   Print then Give to Patient   RxID:   [id number] HYDROCODONE-ACETAMINOPHEN 5-325 MG TABS (HYDROCODONE-ACETAMINOPHEN) 1-2 by mouth two times a day as needed pain  #60 x 0   Entered and Authorized by:   Tresa Garter MD   Signed by:   Tresa Garter MD on 03/04/2010   Method used:   Print then Give to Patient   RxID:   [id number]

## 2010-09-03 NOTE — Medication Information (Signed)
Summary: Prior Auth for Nuvigil/[employer]  Prior Auth for Nuvigil/[employer]   Imported By: Sherian Rein 10/31/2009 11:46:44  _____________________________________________________________________  External Attachment:    Type:   Image     Comment:   External Document

## 2010-09-03 NOTE — Assessment & Plan Note (Signed)
Summary: 2 WK ROV /NWS  #   Vital Signs:  Patient profile:   40 year old female Height:      63 inches Weight:      207 pounds BMI:     36.80 O2 Sat:      97 % on Room air Temp:     98.7 degrees F oral Pulse rate:   85 / minute Pulse rhythm:   regular Resp:     16 per minute BP sitting:   112 / 80  (left arm) Cuff size:   regular  Vitals Entered By: Lanier Prude, CMA(AAMA) (March 18, 2010 10:08 AM)  O2 Flow:  Room air CC: 2 wk f/u Is Patient Diabetic? No   CC:  2 wk f/u.  History of Present Illness: The patient presents for a follow up of back pain, leg pain - feeling better. To discuss MRI report and labs.  Current Medications (verified): 1)  Ferrex 150 150 Mg  Caps (Polysaccharide Iron Complex) .... Three Times A Day 2)  Vitamin B-12 1000 Mcg  Tabs (Cyanocobalamin) .Marland Kitchen.. 1 Qd 3)  Vitamin D3 1000 Unit  Tabs (Cholecalciferol) .Marland Kitchen.. 1 Qd 4)  Vitamin C 200mg  .... Two Times A Day 5)  Folic Acid 400 Mcs Daily 6)  Loratadine 10 Mg  Tabs (Loratadine) .... Once Daily As Needed Allergies 7)  Triamcinolone Acetonide 0.5 % Crea (Triamcinolone Acetonide) .... Apply Qid-Bid To Affected Area 8)  Lotrisone 1-0.05 % Crea (Clotrimazole-Betamethasone) .... Use Bid 9)  Tribenzor 40-10-25 Mg Tabs (Olmesartan-Amlodipine-Hctz) .Marland Kitchen.. 1 By Mouth Once Daily For Blood Pressure 10)  Nuvigil 150 Mg Tabs (Armodafinil) .Marland Kitchen.. 1 By Mouth Qam or 1 H Prior To Shift 11)  Prenatal Plus/iron 27-1 Mg Tabs (Prenatal Vit-Fe Fumarate-Fa) .Marland Kitchen.. 1 By Mouth Qd 12)  Naproxen 500 Mg Tabs (Naproxen) .Marland Kitchen.. 1 By Mouth Two Times A Day Pc For Pain/arthritis 13)  Hydrocodone-Acetaminophen 5-325 Mg Tabs (Hydrocodone-Acetaminophen) .Marland Kitchen.. 1-2 By Mouth Two Times A Day As Needed Pain 14)  Prednisone 10 Mg Tabs (Prednisone) .... Take 40mg  Qd For 3 Days, Then 20 Mg Qd For 3 Days, Then 10mg  Qd For 6 Days, Then Stop. Take Pc.  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    Physical Exam  General:  overweight-appearing.   Head:  Normocephalic and atraumatic without obvious abnormalities. No apparent alopecia or balding. Eyes:  No corneal or conjunctival inflammation noted. EOMI. Perrla. Funduscopic exam benign, without hemorrhages, exudates or papilledema. Vision grossly normal. Ears:  External ear exam shows no significant lesions or deformities.  Otoscopic examination reveals clear canals, tympanic membranes are intact bilaterally without bulging, retraction, inflammation or discharge. Hearing is grossly normal bilaterally. Mouth:  Oral mucosa and oropharynx without lesions or exudates.  Teeth in good repair. Neck:  No deformities, masses, or tenderness noted. Lungs:  Normal respiratory effort, chest expands symmetrically. Lungs are clear to auscultation, no crackles or wheezes. Heart:  Normal rate and regular rhythm. S1 and S2 normal without gallop, murmur, click, rub or other extra sounds. Abdomen:  Bowel sounds positive,abdomen soft and non-tender without masses, organomegaly or hernias noted. Msk:  No deformity or scoliosis noted of thoracic or lumbar spine.   Lumbar-sacral spine is tender to palpation over paraspinal muscles and  painfull with the ROM B hips NT w/ROM [place] leg elev +/- B  B IT bands are tender Pulses:  R and L carotid,radial,femoral,dorsalis pedis and posterior tibial pulses are full and equal bilaterally Extremities:  No edema B Neurologic:  Large B thighs anterolat. oval shaped areas w/decreased sensationcranial nerves II-XII intact, gait normal, DTRs symmetrical and normal, and Romberg negative.   Skin:  Intact without suspicious lesions or rashes Psych:  Cognition and judgment appear intact. Alert and cooperative with normal attention span and concentration. No apparent delusions, illusions, hallucinations   Impression & Recommendations:  Problem # 1:  LOW BACK PAIN Assessment Improved Steroids helped MRI reviewed with pt   Orders: Rheumatology Referral (Rheumatology)  Problem # 2:  ARTHRALGIA (ICD-719.40)  She has elev ESR and (?) pos ANA. Will recheck labs.  Orders: Rheumatology Referral (Rheumatology) Dr Kellie Simmering to r/o CTD  Problem # 3:  LEG PAIN (ICD-729.5) B IT band and troch bursa pain See "Patient Instructions".   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 Three times a day 2)  Vitamin B-12 1000 Mcg Tabs (Cyanocobalamin) .Marland Kitchen.. 1 qd 3)  Vitamin D3 1000 Unit Tabs (Cholecalciferol) .Marland Kitchen.. 1 qd 4)  Vitamin C 200mg   .... Two times a day 5)  Folic Acid 400 Mcs Daily  6)  Loratadine 10 Mg Tabs (Loratadine) .... Once daily as needed allergies 7)  Triamcinolone Acetonide 0.5 % Crea (Triamcinolone acetonide) .... Apply qid-bid to affected area 8)  Lotrisone 1-0.05 % Crea (Clotrimazole-betamethasone) .... Use bid 9)  Tribenzor 40-10-25 Mg Tabs (Olmesartan-amlodipine-hctz) .Marland Kitchen.. 1 by mouth once daily for blood pressure 10)  Nuvigil 150 Mg Tabs (Armodafinil) .Marland Kitchen.. 1  by mouth qam or 1 h prior to shift 11)  Prenatal Plus/iron 27-1 Mg Tabs (Prenatal vit-fe fumarate-fa) .Marland Kitchen.. 1 by mouth qd 12)  Naproxen 500 Mg Tabs (Naproxen) .Marland Kitchen.. 1 by mouth two times a day pc for pain/arthritis 13)  Hydrocodone-acetaminophen 5-325 Mg Tabs (Hydrocodone-acetaminophen) .Marland Kitchen.. 1-2 by mouth two times a day as needed pain  Patient Instructions: 1)  Please schedule a follow-up appointment in 2 months. 2)  Go on Youtube (www.youtube.com) and look up  "IT band stretch" and a "glute stretch". See the anatomy and learn the symptoms.  Do the stretches - it may help!  3)  Use stretching and balance exercises that I have provided (15 min. or longer every day)

## 2010-09-03 NOTE — Progress Notes (Signed)
Summary: REQ FOR Rx  Phone Note Call from Patient Call back at [facility] Phone [phone]   Summary of Call: Pt c/o continued back pain and is getting little relief from tramadol and naproxyn. Pain is causing her to have trouble sleeping. She is req alternate rx for pain. Initial call taken by: Lamar Sprinkles, CMA,  February 12, 2010 5:34 PM  Follow-up for Phone Call        rec 6 day pred pak (10mg ) - may send e-rx if pt agrees - needs ROV with AVP next week if cont pain symptoms  Follow-up by: Newt Lukes MD,  February 15, 2010 8:29 AM  Additional Follow-up for Phone Call Additional follow up Details #1::        Noted. Agree. Thx Additional Follow-up by: Tresa Garter MD,  February 15, 2010 8:51 AM    Additional Follow-up for Phone Call Additional follow up Details #2::    left mess to call office back................Marland KitchenLamar Sprinkles, CMA  February 15, 2010 9:31 AM  left mess to call office back.....................Marland KitchenLamar Sprinkles, CMA  February 18, 2010 6:14 PM   Pt does not want steriods, she has concerns about side effects - per pt causes irritablilty & weight gain. Pt wants a pain med to "take the egde off" until she can get further workup on scheduled apt 03/04/10. Wants labs (RA, etc..) and further testing to find out cause. I tried to explain that the steriods would help w/inflammation which may be part of cause and therefore could reduce pain. She is going out of town Friday am - 3 hour flight and will be away x 8 days.............Marland KitchenLamar Sprinkles, CMA  February 19, 2010 9:15 AM    Additional Follow-up for Phone Call Additional follow up Details #3:: Details for Additional Follow-up Action Taken: Try Vicodin Keep return office visit   left mess to call office back, need to confirm pharmacy................Marland KitchenLamar Sprinkles, CMA  February 20, 2010 10:49 AM   Pt informed .............Marland KitchenLamar Sprinkles, CMA  February 20, 2010 10:57 AM   Additional Follow-up by: Tresa Garter MD,  February 20, 2010 7:59  AM  New/Updated Medications: HYDROCODONE-ACETAMINOPHEN 5-325 MG TABS (HYDROCODONE-ACETAMINOPHEN) 1-2 by mouth two times a day as needed pain Prescriptions: HYDROCODONE-ACETAMINOPHEN 5-325 MG TABS (HYDROCODONE-ACETAMINOPHEN) 1-2 by mouth two times a day as needed pain  #30 x 0   Entered by:   Lamar Sprinkles, CMA   Authorized by:   Tresa Garter MD   Signed by:   Lamar Sprinkles, CMA on 02/20/2010   Method used:   Telephoned to ...       [facility] [address] (retail)       [address] [place], [place]  [address]       Ph: [phone]       Fax: [phone]   RxID:   [id number]

## 2010-09-03 NOTE — Consult Note (Signed)
Summary: Stacey Drain MD  Stacey Drain MD   Imported By: Lennie Odor 04/19/2010 11:41:08  _____________________________________________________________________  External Attachment:    Type:   Image     Comment:   External Document

## 2010-09-03 NOTE — Assessment & Plan Note (Signed)
Summary: LELG PAIN--STC   Vital Signs:  Patient profile:   40 year old female Height:      63 inches Weight:      207.25 pounds BMI:     36.85 O2 Sat:      99 % on Room air Temp:     98.0 degrees F oral Pulse rate:   82 / minute BP sitting:   100 / 72  (left arm) Cuff size:   large  Vitals Entered By: Lucious Groves (January 18, 2010 11:15 AM)  O2 Flow:  Room air CC: C/O leg and back pain./kb Is Patient Diabetic? No Pain Assessment Patient in pain? yes     Location: legs/back Intensity: 7 Type: aching Onset of pain  couple of weeks Comments Patient notes that Gabapentin has not helped./kb   CC:  C/O leg and back pain./kb.  History of Present Illness: C/o LBP x 1 wk and leg pain from working too much 56 h/wk x 2 wks: a lot of pulling and lifting at work. LBP is 7/10. C/o fatigue.  Current Medications (verified): 1)  Ferrex 150 150 Mg  Caps (Polysaccharide Iron Complex) .... Three Times A Day 2)  Vitamin B-12 1000 Mcg  Tabs (Cyanocobalamin) .Marland Kitchen.. 1 Qd 3)  Vitamin D3 1000 Unit  Tabs (Cholecalciferol) .Marland Kitchen.. 1 Qd 4)  Vitamin C 200mg  .... Two Times A Day 5)  Folic Acid 400 Mcs Daily 6)  Loratadine 10 Mg  Tabs (Loratadine) .... Once Daily As Needed Allergies 7)  Triamcinolone Acetonide 0.5 % Crea (Triamcinolone Acetonide) .... Apply Qid-Bid To Affected Area 8)  Lotrisone 1-0.05 % Crea (Clotrimazole-Betamethasone) .... Use Bid 9)  Tribenzor 40-10-25 Mg Tabs (Olmesartan-Amlodipine-Hctz) .Marland Kitchen.. 1 By Mouth Once Daily For Blood Pressure 10)  Nuvigil 150 Mg Tabs (Armodafinil) .Marland Kitchen.. 1 By Mouth Qam or 1 H Prior To Shift 11)  Prenatal Plus/iron 27-1 Mg Tabs (Prenatal Vit-Fe Fumarate-Fa) .Marland Kitchen.. 1 By Mouth Qd 12)  Gabapentin 100 Mg Caps (Gabapentin) .Marland Kitchen.. 1-2 By Mouth At Bedtime As Needed Numbness  Allergies (verified): No Known Drug Allergies  Past History:  Past Medical History: Last updated: 12/18/2009 ANEMIA-IRON DEFICIENCY (ICD-280.9) FIBROIDS, UTERUS (ICD-218.9) VITAMIN D DEFICIENCY  (ICD-268.9) NAUSEA ALONE (ICD-787.02) UNSPECIFIED IRON DEFICIENCY ANEMIA (ICD-280.9) B12 DEFICIENCY (ICD-266.2) ANEMIA-NOS (ICD-285.9) FIBROIDS, UTERUS (ICD-218.9) Allergic rhinitis Hypertension Obesity Asthma as a child M. 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    Physical Exam  General:  overweight-appearing.   Nose:  External nasal examination shows no deformity or inflammation. Nasal mucosa are pink and moist without lesions or exudates. Mouth:  Oral mucosa and oropharynx without lesions or exudates.  Teeth in good repair. Lungs:  Normal respiratory effort, chest expands symmetrically. Lungs are clear to auscultation, no crackles or wheezes. Heart:  Normal rate and regular rhythm. S1 and S2 normal without gallop, murmur, click, rub or other extra sounds. Abdomen:  Bowel sounds positive,abdomen soft and non-tender without masses, organomegaly or hernias noted. Msk:  No deformity or scoliosis noted of thoracic or lumbar spine.   Lumbar-sacral spine is tender to palpation over paraspinal muscles and painfull with the ROM  Neurologic:  Large B thighs anterolat. oval shaped areas w/decreased sensationcranial nerves II-XII intact, gait normal, DTRs symmetrical and normal, and Romberg negative.   Skin:  Intact without suspicious lesions or rashes Psych:  Cognition and judgment appear intact. Alert and cooperative with  normal attention span and concentration. No apparent delusions, illusions, hallucinations   Impression & Recommendations:  Problem # 1:  LOW BACK PAIN, ACUTE (ICD-724.2) MSK strain Assessment New  Orders: TLB-Udip ONLY (81003-UDIP)  Her updated medication list for this problem includes:    Naproxen 500 Mg Tabs (Naproxen) .Marland Kitchen... 1 by mouth two times a day pc for pain/arthritis    Tramadol Hcl 50 Mg Tabs  (Tramadol hcl) .Marland Kitchen... 1-2 tabs by mouth two times a day as needed pain  Problem # 2:  MERALGIA PARESTHETICA (ICD-355.1) Assessment: Improved  Problem # 3:  FATIGUE (ICD-780.79) Assessment: Deteriorated  Problem # 4:  ARTHRALGIA (ICD-719.40) due to working too much Assessment: Deteriorated  Complete Medication List: 1)  Ferrex 150 150 Mg Caps (Polysaccharide iron complex) .... Three times a day 2)  Vitamin B-12 1000 Mcg Tabs (Cyanocobalamin) .Marland Kitchen.. 1 qd 3)  Vitamin D3 1000 Unit Tabs (Cholecalciferol) .Marland Kitchen.. 1 qd 4)  Vitamin C 200mg   .... Two times a day 5)  Folic Acid 400 Mcs Daily  6)  Loratadine 10 Mg Tabs (Loratadine) .... Once daily as needed allergies 7)  Triamcinolone Acetonide 0.5 % Crea (Triamcinolone acetonide) .... Apply qid-bid to affected area 8)  Lotrisone 1-0.05 % Crea (Clotrimazole-betamethasone) .... Use bid 9)  Tribenzor 40-10-25 Mg Tabs (Olmesartan-amlodipine-hctz) .Marland Kitchen.. 1 by mouth once daily for blood pressure 10)  Nuvigil 150 Mg Tabs (Armodafinil) .Marland Kitchen.. 1 by mouth qam or 1 h prior to shift 11)  Prenatal Plus/iron 27-1 Mg Tabs (Prenatal vit-fe fumarate-fa) .Marland Kitchen.. 1 by mouth qd 12)  Naproxen 500 Mg Tabs (Naproxen) .Marland Kitchen.. 1 by mouth two times a day pc for pain/arthritis 13)  Tramadol Hcl 50 Mg Tabs (Tramadol hcl) .Marland Kitchen.. 1-2 tabs by mouth two times a day as needed pain  Patient Instructions: 1)  Use stretching and balance exercises that I have provided (15 min. or longer every day) bPrescriptions: TRAMADOL HCL 50 MG TABS (TRAMADOL HCL) 1-2 tabs by mouth two times a day as needed pain  #60 x 3   Entered and Authorized by:   Tresa Garter MD   Signed by:   Lamar Sprinkles, CMA on 01/18/2010   Method used:   Electronically to        [facility] # [id number][facility] (retail)       [address] [place], [place]  [address]       Ph: [phone]       Fax: [phone]   RxID:   [id number] NAPROXEN 500 MG TABS (NAPROXEN) 1 by mouth two times a day pc for pain/arthritis   #60 x 2   Entered and Authorized by:   Tresa Garter MD   Signed by:   Lamar Sprinkles, CMA on 01/18/2010   Method used:   Electronically to        [facility] # [id number]* (retail)       [address] [place], [place]  [address]       Ph: [phone]       Fax: [phone]   RxID:   [id number]

## 2010-09-03 NOTE — Medication Information (Signed)
Summary: Nuvigil Approved/[facility]  Nuvigil Approved/[facility]   Imported By: Sherian Rein 11/01/2009 08:02:31  _____________________________________________________________________  External Attachment:    Type:   Image     Comment:   External Document

## 2010-09-23 ENCOUNTER — Telehealth: Payer: Self-pay | Admitting: Internal Medicine

## 2010-10-01 ENCOUNTER — Ambulatory Visit (INDEPENDENT_AMBULATORY_CARE_PROVIDER_SITE_OTHER): Payer: Managed Care, Other (non HMO) | Admitting: Internal Medicine

## 2010-10-01 ENCOUNTER — Other Ambulatory Visit: Payer: Self-pay | Admitting: Internal Medicine

## 2010-10-01 ENCOUNTER — Encounter: Payer: Self-pay | Admitting: Internal Medicine

## 2010-10-01 ENCOUNTER — Other Ambulatory Visit: Payer: Managed Care, Other (non HMO)

## 2010-10-01 DIAGNOSIS — R000 Tachycardia, unspecified: Secondary | ICD-10-CM

## 2010-10-01 DIAGNOSIS — E538 Deficiency of other specified B group vitamins: Secondary | ICD-10-CM

## 2010-10-01 DIAGNOSIS — M2550 Pain in unspecified joint: Secondary | ICD-10-CM

## 2010-10-01 DIAGNOSIS — R7309 Other abnormal glucose: Secondary | ICD-10-CM

## 2010-10-01 DIAGNOSIS — D649 Anemia, unspecified: Secondary | ICD-10-CM

## 2010-10-01 LAB — TSH: TSH: 0.87 u[IU]/mL (ref 0.35–5.50)

## 2010-10-01 LAB — URINALYSIS
Leukocytes, UA: NEGATIVE
Nitrite: NEGATIVE
Specific Gravity, Urine: 1.01 (ref 1.000–1.030)
Total Protein, Urine: NEGATIVE
Urine Glucose: NEGATIVE
pH: 6 (ref 5.0–8.0)

## 2010-10-01 LAB — CBC WITH DIFFERENTIAL/PLATELET
Basophils Absolute: 0 10*3/uL (ref 0.0–0.1)
Basophils Relative: 0.3 % (ref 0.0–3.0)
Eosinophils Absolute: 0.1 10*3/uL (ref 0.0–0.7)
HCT: 35.6 % — ABNORMAL LOW (ref 36.0–46.0)
Lymphocytes Relative: 24.2 % (ref 12.0–46.0)
MCHC: 33.2 g/dL (ref 30.0–36.0)
MCV: 83.7 fl (ref 78.0–100.0)
Monocytes Absolute: 0.4 10*3/uL (ref 0.1–1.0)
Neutro Abs: 3.9 10*3/uL (ref 1.4–7.7)
RBC: 4.26 Mil/uL (ref 3.87–5.11)
RDW: 15.1 % — ABNORMAL HIGH (ref 11.5–14.6)
WBC: 5.8 10*3/uL (ref 4.5–10.5)

## 2010-10-01 LAB — BASIC METABOLIC PANEL
Calcium: 9.6 mg/dL (ref 8.4–10.5)
Glucose, Bld: 82 mg/dL (ref 70–99)
Potassium: 3.9 mEq/L (ref 3.5–5.1)
Sodium: 137 mEq/L (ref 135–145)

## 2010-10-01 LAB — HEPATIC FUNCTION PANEL
AST: 20 U/L (ref 0–37)
Total Protein: 7.2 g/dL (ref 6.0–8.3)

## 2010-10-01 NOTE — Progress Notes (Signed)
Summary: Elevated HR?   Phone Note Call from Patient   Summary of Call: Pt c/o rapid hr of 94 and fatigue since friday. She has an apt next tuesday which was first avail. Wants a call back from MD or MD's advisement on what he suggests.  Initial call taken by: Lamar Sprinkles, CMA,  September 23, 2010 11:57 AM  Follow-up for Phone Call        PER MD - office visit w/any avail md if sick otherwise rest. Spoke w/pt. She has just come off of night shift and is normally fatigued.Currently also taking CNA classes - Worried about pulse of 94. No pain, sob, palpatations etc. May have drank more coffee than normal that day.  Advised her to rest and continue to monitor how she feels closely - Go to ER w/severe symptoms. BP today 106/68 p84. She will call with any further concerns or questions.  Follow-up by: Lamar Sprinkles, CMA,  September 23, 2010 1:42 PM  Additional Follow-up for Phone Call Additional follow up Details #1::        ok Thank you!  Additional Follow-up by: Tresa Garter MD,  September 23, 2010 9:32 PM

## 2010-10-15 NOTE — Assessment & Plan Note (Signed)
Summary: FAST PULSE FOR SEVERAL DAYS/ 94 BEATS/ REFUSED APPT WITH ANOT...   Vital Signs:  Patient profile:   40 year old female Height:      63 inches Weight:      208 pounds O2 Sat:      98 % on Room air Temp:     97.8 degrees F oral Pulse rate:   75 / minute Resp:     16 per minute BP sitting:   120 / 78  (left arm) Cuff size:   regular  Vitals Entered By: Burnard Leigh CMA(AAMA) (October 01, 2010 8:22 AM)  O2 Flow:  Room air CC: Pt c/o of abnormally high pulse rate that has been sporadic x1 week/sls,cma Is Patient Diabetic? No Comments Pt states she needs Rx refills: Tribenzor, Nuvigil, Prenatal lus/Iron Pt would like to know if we can authorize Rx refills for meds prescribed by Dr. Kellie Simmering: Cyclobenzaprine & Alprazolam   CC:  Pt c/o of abnormally high pulse rate that has been sporadic x1 week/sls and cma.  History of Present Illness: C/o HR 90s off and on x 1 wk  F/u LBP and FMS, OA - Dr Kellie Simmering gave her Flexeril and Alprazolam  to help her  rest better...it helped.  Current Medications (verified): 1)  Ferrex 150 150 Mg  Caps (Polysaccharide Iron Complex) .... Three Times A Day 2)  Vitamin B-12 1000 Mcg  Tabs (Cyanocobalamin) .Marland Kitchen.. 1 Qd 3)  Vitamin D3 1000 Unit  Tabs (Cholecalciferol) .Marland Kitchen.. 1 Qd 4)  Vitamin C 200mg  .... Two Times A Day 5)  Folic Acid 400 Mcs Daily 6)  Loratadine 10 Mg  Tabs (Loratadine) .... Once Daily As Needed Allergies 7)  Triamcinolone Acetonide 0.5 % Crea (Triamcinolone Acetonide) .... Apply Qid-Bid To Affected Area 8)  Lotrisone 1-0.05 % Crea (Clotrimazole-Betamethasone) .... Use Bid 9)  Tribenzor 40-10-25 Mg Tabs (Olmesartan-Amlodipine-Hctz) .Marland Kitchen.. 1 By Mouth Once Daily For Blood Pressure 10)  Nuvigil 150 Mg Tabs (Armodafinil) .Marland Kitchen.. 1 By Mouth Qam or 1 H Prior To Shift 11)  Prenatal Plus/iron 27-1 Mg Tabs (Prenatal Vit-Fe Fumarate-Fa) .Marland Kitchen.. 1 By Mouth Qd 12)  Naproxen 500 Mg Tabs (Naproxen) .Marland Kitchen.. 1 By Mouth Two Times A Day Pc For Pain/arthritis 13)   Hydrocodone-Acetaminophen 5-325 Mg Tabs (Hydrocodone-Acetaminophen) .Marland Kitchen.. 1-2 By Mouth Two Times A Day As Needed Pain 14)  Cyclobenzaprine Hcl 10 Mg Tabs (Cyclobenzaprine Hcl) .... Take 1-2 By Mouth At Bedtime 15)  Alprazolam 0.5 Mg Tabs (Alprazolam) .... Take 1 By Mouth Two Times A Day As Needed  Allergies (verified): No Known Drug Allergies  Past History:  Past Medical History: Last updated: 12/18/2009 ANEMIA-IRON DEFICIENCY (ICD-280.9) FIBROIDS, UTERUS (ICD-218.9) VITAMIN D DEFICIENCY (ICD-268.9) NAUSEA ALONE (ICD-787.02) UNSPECIFIED IRON DEFICIENCY ANEMIA (ICD-280.9) B12 DEFICIENCY (ICD-266.2) ANEMIA-NOS (ICD-285.9) FIBROIDS, UTERUS (ICD-218.9) Allergic rhinitis Hypertension Obesity Asthma as a child M. 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         No LOC  Physical Exam  General:  overweight-appearing.   Head:  Normocephalic and atraumatic without obvious abnormalities. No apparent alopecia or balding. Nose:  External nasal examination shows no deformity or inflammation. Nasal mucosa are pink and moist without lesions or exudates. Mouth:  Oral mucosa and oropharynx without lesions or exudates.  Teeth in good repair. Neck:  No deformities, masses, or tenderness noted. Lungs:  Normal respiratory effort, chest expands symmetrically. Lungs are clear to auscultation, no crackles or wheezes. Heart:  Normal rate and regular rhythm. S1 and S2 normal without gallop, murmur, click, rub or other extra sounds. Abdomen:  Bowel sounds positive,abdomen soft and non-tender without masses, organomegaly or hernias noted. Msk:  No deformity or scoliosis noted of thoracic or lumbar spine.   Lumbar-sacral spine is tender to palpation over paraspinal muscles and painfull  with the ROM B hips NT w/ROM [place] leg elev +/- B  B IT bands are tender Neurologic:  Large B thighs anterolat. oval shaped areas w/decreased sensationcranial nerves II-XII intact, gait normal, DTRs symmetrical and normal, and Romberg negative.   Skin:  Intact without suspicious lesions or rashes Psych:  Cognition and judgment appear intact. Alert and cooperative with normal attention span and concentration. No apparent delusions, illusions, hallucinations   Impression & Recommendations:  Problem # 1:  TACHYCARDIA (ICD-785.0) poss SVT vs S tachy Assessment New Avoid caffeine Orders: EKG w/ Interpretation (93000) NSR  HR 75 TLB-B12, Serum-Total ONLY (63875-I43) TLB-BMP (Basic Metabolic Panel-BMET) (80048-METABOL) TLB-CBC Platelet - w/Differential (85025-CBCD) TLB-Hepatic/Liver Function Pnl (80076-HEPATIC) TLB-TSH (Thyroid Stimulating Hormone) (84443-TSH) TLB-Udip ONLY (81003-UDIP) TLB-Sedimentation Rate (ESR) (85652-ESR)  Problem # 2:  ARTHRALGIA (ICD-719.40) ?FMS Assessment: Improved  Alpraz and Flexeril helped  Orders: TLB-B12, Serum-Total ONLY (32951-O84) TLB-BMP (Basic Metabolic Panel-BMET) (80048-METABOL) TLB-CBC Platelet - w/Differential (85025-CBCD) TLB-Hepatic/Liver Function Pnl (80076-HEPATIC) TLB-TSH (Thyroid Stimulating Hormone) (84443-TSH) TLB-Udip ONLY (81003-UDIP) TLB-Sedimentation Rate (ESR) (85652-ESR)  Problem # 3:  HYPERGLYCEMIA (ICD-790.29) Assessment: Comment Only  Orders: TLB-B12, Serum-Total ONLY (16606-T01) TLB-BMP (Basic Metabolic Panel-BMET) (80048-METABOL) TLB-CBC Platelet - w/Differential (85025-CBCD) TLB-Hepatic/Liver Function Pnl (80076-HEPATIC) TLB-TSH (Thyroid Stimulating Hormone) (84443-TSH) TLB-Udip ONLY (81003-UDIP) TLB-Sedimentation Rate (ESR) (85652-ESR)  Problem # 4:  ANEMIA-IRON DEFICIENCY (ICD-280.9) Assessment: Comment Only  Her updated medication list for this problem includes:    Ferrex 150 150 Mg Caps (Polysaccharide  iron complex) .Marland Kitchen... Three times a day    Vitamin B-12 1000 Mcg Tabs (Cyanocobalamin) .Marland Kitchen... 1 qd  Complete Medication List: 1)  Ferrex 150 150 Mg Caps (Polysaccharide iron complex) .... Three times a day 2)  Vitamin B-12 1000 Mcg Tabs (Cyanocobalamin) .Marland Kitchen.. 1 qd 3)  Vitamin D3 1000 Unit Tabs (Cholecalciferol) .Marland Kitchen.. 1 qd 4)  Vitamin C 200mg   .... Two times a day 5)  Folic Acid 400 Mcs Daily  6)  Loratadine 10 Mg Tabs (Loratadine) .... Once daily as needed allergies 7)  Triamcinolone Acetonide 0.5 % Crea (Triamcinolone acetonide) .... Apply qid-bid to affected area 8)  Lotrisone 1-0.05 % Crea (Clotrimazole-betamethasone) .... Use bid 9)  Tribenzor 40-10-25 Mg Tabs (Olmesartan-amlodipine-hctz) .Marland Kitchen.. 1 by mouth once daily for blood pressure 10)  Nuvigil 150 Mg Tabs (Armodafinil) .Marland Kitchen.. 1 by mouth qam or 1 h prior to shift 11)  Prenatal Plus/iron 27-1 Mg Tabs (Prenatal vit-fe fumarate-fa) .Marland Kitchen.. 1 by mouth qd 12)  Naproxen 500 Mg Tabs (Naproxen) .Marland Kitchen.. 1 by mouth two times a day pc for pain/arthritis 13)  Hydrocodone-acetaminophen 5-325 Mg Tabs (Hydrocodone-acetaminophen) .Marland Kitchen.. 1-2 by mouth two times a day as needed pain 14)  Cyclobenzaprine Hcl 10 Mg Tabs (Cyclobenzaprine hcl) .... Take 1-2 by mouth at bedtime 15)  Alprazolam 0.5 Mg Tabs (Alprazolam) .... Take 1 by mouth two times a day as needed  Patient Instructions: 1)  Please schedule a follow-up appointment in 3 months. 2)  Call if you are not better in a reasonable amount of time or if worse. Go to ER if feeling really bad!  3)  Cut back on caffeine Prescriptions: PRENATAL PLUS/IRON 27-1 MG TABS (PRENATAL VIT-FE FUMARATE-FA) 1 by mouth qd  #90 x 3   Entered and Authorized by:   Tresa Garter MD   Signed by:   Tresa Garter MD on 10/01/2010   Method used:   Print then Give to Patient   RxID:   [id number] NUVIGIL 150 MG TABS (ARMODAFINIL) 1 by mouth qam or 1 h prior to shift  #30 x 11   Entered and Authorized by:   Tresa Garter MD   Signed by:   Tresa Garter MD on 10/01/2010   Method used:   Print then Give to Patient   RxID:   [id number] YIRSWNIOE 40-10-25 MG TABS (OLMESARTAN-AMLODIPINE-HCTZ) 1 by mouth once daily for blood pressure  #30 x 11   Entered and Authorized by:   Tresa Garter MD   Signed by:   Tresa Garter MD on 10/01/2010   Method used:   Print then Give to Patient   RxID:   [id number] CYCLOBENZAPRINE HCL 10 MG TABS (CYCLOBENZAPRINE HCL) take 1-2 by mouth at bedtime  #60 x 6   Entered and Authorized by:   Tresa Garter MD   Signed by:   Tresa Garter MD on 10/01/2010   Method used:   Print then Give to Patient   RxID:   [id number] ALPRAZOLAM 0.5 MG TABS (ALPRAZOLAM) take 1 by mouth two times a day as needed  #60 x 6   Entered and Authorized by:   Tresa Garter MD   Signed by:   Tresa Garter MD on 10/01/2010   Method used:   Print then Give to Patient   RxID:   [id number]    Orders Added: 1)  EKG w/ Interpretation [93000] 2)  Est. Patient Level IV [44315] 3)  TLB-B12, Serum-Total ONLY [82607-B12] 4)  TLB-BMP (Basic Metabolic Panel-BMET) [80048-METABOL] 5)  TLB-CBC Platelet - w/Differential [85025-CBCD] 6)  TLB-Hepatic/Liver Function Pnl [80076-HEPATIC] 7)  TLB-TSH (Thyroid Stimulating Hormone) [84443-TSH] 8)  TLB-Udip ONLY [81003-UDIP] 9)  TLB-Sedimentation Rate (ESR) [85652-ESR]     Preventive Care Screening  Mammogram:    Date:  01/02/2010    Results:  normal   Pap Smear:    Date:  12/02/2009    Results:  normal

## 2010-12-17 NOTE — Assessment & Plan Note (Signed)
[facility]                             PULMONARY OFFICE NOTE   [name], [name]                     MRN:          [id number]  DATE:07/06/2007                            DOB:          [birth year]    HISTORY OF PRESENT ILLNESS:  Patient is a very pleasant 40 year old  African-American female patient of Dr. Loren Racer who has a known  history of hypertension, anemia, that presents today complaining of a  three day history of nasal congestion, postnasal drip, and hoarseness.  Patient also complains that over the last few days, she has had some mid  posterior back pain that is worsened with arm movement and bending.  Patient denies any chest pain, palpitations, purulent sputum, fever,  orthopnea, PND, or leg swelling.  Patient does have difficulties with  uterine fibroids with excessive menstrual bleeding.  She is currently on  medications for that.  She has been having difficulty with breakthrough  bleeding and intermittent abdominal cramping.   PAST MEDICAL HISTORY:  Reviewed.   CURRENT MEDICATIONS:  Reviewed.   PHYSICAL EXAMINATION:  Patient is a pleasant female in no acute  distress.  She is afebrile.  Blood pressure recheck is 150/90.  HEENT:  Nasal mucosa is with some mild erythema.  Nontender sinuses.  Posterior pharynx is clear.  TM's are normal.  NECK:  Supple without cervical adenopathy.  No JVD.  LUNG SOUNDS:  Clear.  CARDIAC:  Regular rate.  ABDOMEN:  Morbidly obese and soft.  Patient does have some generalized  tenderness.  No guarding or rebound is noted.  EXTREMITIES:  Warm without any calf tenderness, clubbing, cyanosis or  edema.  Along the mid posterior subscapular region is some tenderness.  No ecchymosis, deformity, or erythema is noted.  Normal upper extremity  range of motion and normal cervical range of motion.   IMPRESSION/PLAN:  1. Acute rhinitis flare:  Patient is to begin Mucinex twice daily,      saline and Afrin  nasal spray x5 days, Xyzal 5 mg at bedtime x5      days.  Patient may begin Nasonex 2 puffs twice daily.  Patient is      to return back with Dr. Posey Rea as scheduled or sooner if needed.  2. Upper back strain:  Patient is recommended to use Motrin 400-600 mg      twice daily with food x5 days.  Warm heat p.r.n.  Patient is to      follow back up if no improvement or worsens.  3. Dysfunctional menstrual bleeding secondary to fibroids.  Patient is      to continue to follow up with her gynecologist.  4. Hypertension:  Patient's blood pressure is slightly elevated today.      Patient is to continue      with her present regimen and follow back up with Dr. Posey Rea as      scheduled in the next four weeks.      Rubye Oaks, NP  Electronically Signed      Charlaine Dalton. Sherene Sires, MD, [facility]  Electronically Signed   TP/MedQ  DD: 07/06/2007  DT: 07/06/2007  Job #: [id number]

## 2010-12-17 NOTE — H&P (Signed)
[name], [name]            ACCOUNT NO.:  [id number]   MEDICAL RECORD NO.:  [id number]          PATIENT TYPE:  INP   LOCATION:  1336                         FACILITY[facility]  [facility]   PHYSICIAN:  Georgina Quint. Plotnikov, MDDATE OF BIRTH:  1971-02-17   DATE OF ADMISSION:  01/22/2007  DATE OF DISCHARGE:                              HISTORY & PHYSICAL   CHIEF COMPLAINT:  Weakness, severe.   HISTORY OF PRESENT ILLNESS:  The patient is a 40 year old female with  recurrent severe anemia due to recurrent heavy blood loss with menses.  Saw me a couple of days ago for heavy periods with weakness. The  hemoglobin came back at 6.9. She was advised blood transfusion. We are  admitting her today for blood transfusion and in fact, she is doing even  worse.   PAST MEDICAL HISTORY:  Uterine fibroids, history of heavy vaginal  bleeding, history of severe anemia with iron deficiency.  1. Vitamin D deficiency.  2. Vitamin B12 deficiency.   ALLERGIES:  NO KNOWN DRUG ALLERGIES.   SOCIAL HISTORY:  She has a boyfriend. No children.   FAMILY HISTORY:  Positive for hypertension.   REVIEW OF SYSTEMS:  Weakness and vertigo. No syncope. The rest is as  above.   PHYSICAL EXAMINATION:  VITAL SIGNS:  Blood pressure 143/81, pulse 76,  temperature 98.5.  GENERAL:  She appears pale.  HEENT:  Moist mucosa.  LUNGS:  Clear. No wheezes.  HEART:  S1 and S2.  ABDOMEN:  Soft, nontender.  EXTREMITIES:  Lower extremities without edema.  NEUROLOGIC:  Alert, oriented, and cooperative.   LABORATORY DATA:  Hemoglobin 6.9 2 days ago, as I recall. Otherwise,  unremarkable.   ASSESSMENT/PLAN:  1. Severe anemia. Will transfuse with 4 units of blood. Check CBC      following this infusion. Continue with iron at home. Can be      discharged tonight, if feeling well or wait until tomorrow. Will      pre-medicate with Benadryl and      Tylenol.  2. Weakness, vertigo, secondary to severe anemia. Plan as above.  3. Other  problems stable.      Georgina Quint. Plotnikov, MD  Electronically Signed     AVP/MEDQ  D:  01/22/2007  T:  01/23/2007  Job:  [id number]

## 2010-12-17 NOTE — Discharge Summary (Signed)
[name], [name]            ACCOUNT NO.:  [id number]   MEDICAL RECORD NO.:  [id number]          PATIENT TYPE:  OBV   LOCATION:  1336                         FACILITY:  [facility]   PHYSICIAN:  Georgina Quint. Plotnikov, MDDATE OF BIRTH:  09-24-1970   DATE OF ADMISSION:  01/22/2007  DATE OF DISCHARGE:  01/23/2007                               DISCHARGE SUMMARY   FINAL DIAGNOSES:  1. Severe anemia with hemoglobin 6.9.  2. Vaginal bleeding, recurrent.  3. Uterine fibrosis.  4. Vitamin D deficiency.  5. Vitamin B12 deficiency.   PROCEDURE:  Transfusion of 4 units red blood cells.   HISTORY:  The patient is a 40 year old patient who presents with  recurrent anemia.  For the details, please address my history and  physical.   HOSPITAL COURSE:  During the course of hospitalization, she was  transfused with 4 units of red blood cells.  She was premedicated with  Benadryl and Tylenol.  On the day of discharge, she was feeling better.  Temperature 98.3, blood pressure 149/87, respirations 20, heart rate 82.  Looks better.  HEENT moist.  Lungs clear.  Neck supple.  Heart regular.  Abdomen soft and nontender.  Lower extremities without edema.  She is  alert, oriented and cooperative.   FOLLOWUP PLANS:  1. Follow up with Dr. Cherly Hensen.  2. Follow up with Dr. Posey Rea with a CBC checked.   DISCHARGE MEDICATIONS:  Continue with previous.      Georgina Quint. Plotnikov, MD  Electronically Signed     AVP/MEDQ  D:  02/17/2007  T:  02/18/2007  Job:  [id number]   cc:   Maxie Better, M.D.  Fax: [phone]

## 2010-12-20 NOTE — Assessment & Plan Note (Signed)
[facility]                             PRIMARY CARE OFFICE NOTE   [name], [name]                     MRN:          [id number]  DATE:04/17/2006                            DOB:          12/22/1970    The patient is a 40 year old female for a new patient present to establish  primary.  She has a history of anemia and fibroid tumors.   PAST MEDICAL HISTORY:  1. Anemia.  2. Fibroid tumors status post myomectomy.   ALLERGIES:  None.   CURRENT MEDICATIONS:  1. Lo/Ovral 28 birth control pills.  2. __________ 60 mg 3 times a day.   SOCIAL HISTORY:  She is single.  No children.  Works swing shift now.  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   FAMILY HISTORY:  Father died in an accident.  Mother is living and healthy.   REVIEW OF SYSTEMS:  Has been feeling tired, sleeping poorly due to shift  work.  No chest pain or shortness of breath.  No bleeding lately.  The rest  is negative.   PHYSICAL:  Blood pressure 163/99, pulse 86, temperature 98.7, weight 210  pounds.  She is in no acute distress.  HEENT:  Moist mucosa.  No pallor.  NECK:  Supple.  No thyromegaly.  No bruit.  LUNGS:  Clear.  No wheeze or rubs.  CARDIAC:  S1, S2.  No murmur.  No gallop.  ABDOMEN:  Soft.  Nontender.  No organomegaly.  No mass.  Lower extremities  without edema.  She is alert and appropriate.  Denies being depressed.  SKIN:  Clear.   ASSESSMENT AND PLAN:  1. Anemia.  Will check CBC and iron.  Continue with iron pills.  2. Fatigue, likely due to the above and insomnia.  3. Fibroid tumor status post myomectomy.  Follow-up with Dr. Cherly Hensen.  4. Elevated blood pressure.  Will follow up in 2-3 months.  She was asked      to check her blood pressure at home and bring me records.                                   Georgina Quint. Plotnikov, MD   AVP/MedQ  DD:  04/21/2006  DT:  04/21/2006  Job #:  [id number]   cc:   Maxie Better, M.D.

## 2010-12-20 NOTE — H&P (Signed)
NAMENEREA, BORDENAVE            ACCOUNT NO.:  [id number]   MEDICAL RECORD NO.:  [id number]          PATIENT TYPE:  INP   LOCATION:  0104                         FACILITY:  [facility]   PHYSICIAN:  Sherin Quarry, MD      DATE OF BIRTH:  [birth year]   DATE OF ADMISSION:  01/12/2006  DATE OF DISCHARGE:                                HISTORY & PHYSICAL   [name] is a 40 year old lady who states that about a month ago she  presented to Dr. Mathews Robinsons office with complaints of weakness, dizziness and  palpitation.  Dr. Renae Gloss did laboratory studies which showed that her  hemoglobin was 7.2.  Further workup apparently showed that she had evidence  of iron deficiency anemia.  Dr. Renae Gloss placed her on Therax 150 1 tablet  daily and advised her to return in 1 month for follow-up hemoglobin.  Of  note is that the patient has been followed by Dr. Kendra Opitz who is a  gynecologist in [place], [place].  She has had a past history of iron  deficiency anemia attributed to menorrhagia about 4 years ago.  Dr. Kendra Opitz  today uterine myomectomy.  Dr. Kendra Opitz has also recommended that she be on  birth control pills to regulate her periods and she is taking Lo-Ogestrel 28  1 tablet daily.  Unfortunately, the patient has continued to experience  weakness, dizziness, palpitations and shortness of breath on walking.  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  She  presented to the [facility] and was  seen there by a physician probably Dr. Blossom Hoops.  Dr. Blossom Hoops did  laboratory studies and told her that her hemoglobin was 6.7.  We therefore  arranged for her to be transported to the [facility] Emergency Room to  receive blood transfusion.  I was asked to see the patient in the emergency  room by the emergency room nurse.  Mrs. Lightcap indicates that she  has had  no hematemesis or melena.  Her stools have been dark since she began taking  the iron.  She has been having some epigastric discomfort for which she  takes Tums.  She has not been taking unusual amount of Aleve or other  nonsteroidal anti-inflammatory drugs.  She is admitted at this time for  evaluation of severe anemia.   CURRENT MEDICATIONS:  The above-mentioned Therax 150 with 1 tablets daily.  She has no known drug allergies.   OPERATIONS:  The only operation she has had in the past was the uterine  myomectomy.   MEDICAL ILLNESSES:  Otherwise none.   FAMILY HISTORY:  She is an only child.  Her father died of complications of  hypertension.  Her mother is said to be in good health.   SOCIAL HISTORY:  As previously mentioned, she works doing loading at [employer].  She does not have any children.  She does not smoke.  She does  not abuse alcohol or drugs   REVIEW OF SYSTEMS:  HEAD:  She has a dull headache.  EARS/NOSE/THROAT:  Denies earache, sinus pain or sore throat.  CHEST:  Denies coughing or  wheezing.  As previously mentioned, she is somewhat short of breath with  exertion.  CARDIOVASCULAR:  She noticed a feeling of palpitations or  fluttering in her chest with exertion.  GI:  See above.  GU:  Denies dysuria  or urinary frequency.  GYN:  She says that her menstrual periods occur 28  days apart but are very heavy with passage of large clots.  The birth  control pills have caused her periods to become more regular but they are  still quite heavy.  NEURO:  No history of seizure or stroke.  ENDO:  Denies  excessive thirst, urinary frequency or nocturia.   PHYSICAL EXAM:  Markedly hampered because the patient was seen in the hall  of the emergency room on a stretcher.  Blood pressure was 170/80, pulse was  90, respirations 16, O2 saturation 98%. HEENT exam the thyroid felt somewhat  irregular but I could not feel any definite nodules.  Tympanic membranes  were  clear.  Nares were patent.  Pharynx without erythema or exudate.  Chest  was clear to auscultation.  Cardiovascular exam showed normal S1-S2 without  rubs, murmurs or gallops.  The abdomen was benign to palpation.  There was  no guarding or rebound.  Neurologic testing was within normal limits.  Examination of the extremities was within normal limits.   IMPRESSION:  1.  History of severe symptomatic iron deficiency anemia.  2.  Chronic gastroesophageal reflux by history.  3.  Chronic birth control pill usage.   PLAN:  1.  Will confirm the hemoglobin result.  2.  Will transfuse blood as this will provide symptomatic benefit.  3.  Will check iron TIBC, folate, B12, TSH.  4.  Get GYN evaluation.  The patient's gynecologist apparently is in      [place], [place].  It is a Dr. Kendra Opitz there but would appear that she      needs to have a gynecologist here in [place].  She will need close      follow-up of her blood count.           ______________________________  Sherin Quarry, MD     SY/MEDQ  D:  01/13/2006  T:  01/13/2006  Job:  [id number]   cc:   Leanne Chang, M.D.  Fax: [phone]

## 2011-01-06 ENCOUNTER — Encounter: Payer: Self-pay | Admitting: Internal Medicine

## 2011-01-07 ENCOUNTER — Encounter: Payer: Managed Care, Other (non HMO) | Admitting: Internal Medicine

## 2011-02-19 ENCOUNTER — Other Ambulatory Visit: Payer: Self-pay | Admitting: Obstetrics and Gynecology

## 2011-04-14 ENCOUNTER — Telehealth: Payer: Self-pay | Admitting: *Deleted

## 2011-04-14 NOTE — Telephone Encounter (Signed)
Pharm faxed RF request -  XANAX 0.5 mg bid prn # 60

## 2011-04-14 NOTE — Telephone Encounter (Signed)
Pt calling also, requesting Rf for alprazolam. Please advise

## 2011-04-15 MED ORDER — ALPRAZOLAM 0.5 MG PO TABS: 0.5000 mg | ORAL_TABLET | Freq: Two times a day (BID) | ORAL | Status: DC | PRN

## 2011-04-15 NOTE — Telephone Encounter (Signed)
OK to fill this prescription with additional refills x0 Thank you!  

## 2011-04-15 NOTE — Telephone Encounter (Signed)
done

## 2011-05-12 LAB — CBC
HCT: 35.7 — ABNORMAL LOW
Hemoglobin: 11.8 — ABNORMAL LOW
MCHC: 33.1
Platelets: 261
RDW: 24.5 — ABNORMAL HIGH

## 2011-05-12 LAB — CROSSMATCH: ABO/RH(D): AB POS

## 2011-05-19 LAB — URINALYSIS, ROUTINE W REFLEX MICROSCOPIC
Glucose, UA: NEGATIVE
Ketones, ur: NEGATIVE
Leukocytes, UA: NEGATIVE
pH: 6

## 2011-05-19 LAB — CBC
HCT: 36.3
Platelets: 162
RBC: 4.87
WBC: 9.4

## 2011-05-19 LAB — COMPREHENSIVE METABOLIC PANEL
AST: 31
Albumin: 3.4 — ABNORMAL LOW
Alkaline Phosphatase: 39
Chloride: 103
GFR calc Af Amer: >60
Potassium: 4.9
Total Bilirubin: 1

## 2011-05-19 LAB — DIFFERENTIAL
Basophils Absolute: 0.1
Basophils Relative: 1
Eosinophils Absolute: 0.1
Eosinophils Relative: 1
Monocytes Absolute: 0.6

## 2011-05-19 LAB — URINE MICROSCOPIC-ADD ON

## 2011-05-21 LAB — CROSSMATCH

## 2011-05-21 LAB — CBC
HCT: 36
Hemoglobin: 11.6 — ABNORMAL LOW
RDW: 23.6 — ABNORMAL HIGH

## 2011-05-29 ENCOUNTER — Telehealth: Payer: Self-pay | Admitting: *Deleted

## 2011-05-29 NOTE — Telephone Encounter (Signed)
Pt left VM - c/o back pain, wants OV. Called pt, no answer, left detailed Vm - Call office and schedule apt w/PLOT or any other avail MD. Make apt asap if symptoms are severe or not relieved by current treatment.

## 2011-09-02 ENCOUNTER — Encounter: Payer: Self-pay | Admitting: Internal Medicine

## 2011-09-02 ENCOUNTER — Other Ambulatory Visit (INDEPENDENT_AMBULATORY_CARE_PROVIDER_SITE_OTHER): Payer: Managed Care, Other (non HMO)

## 2011-09-02 ENCOUNTER — Ambulatory Visit (INDEPENDENT_AMBULATORY_CARE_PROVIDER_SITE_OTHER): Payer: Managed Care, Other (non HMO) | Admitting: Internal Medicine

## 2011-09-02 VITALS — BP 120/80 | HR 84 | Temp 97.5°F | Resp 16 | Wt 211.0 lb

## 2011-09-02 DIAGNOSIS — E559 Vitamin D deficiency, unspecified: Secondary | ICD-10-CM

## 2011-09-02 DIAGNOSIS — M2550 Pain in unspecified joint: Secondary | ICD-10-CM

## 2011-09-02 DIAGNOSIS — D509 Iron deficiency anemia, unspecified: Secondary | ICD-10-CM

## 2011-09-02 DIAGNOSIS — E538 Deficiency of other specified B group vitamins: Secondary | ICD-10-CM

## 2011-09-02 DIAGNOSIS — R5381 Other malaise: Secondary | ICD-10-CM

## 2011-09-02 DIAGNOSIS — D259 Leiomyoma of uterus, unspecified: Secondary | ICD-10-CM

## 2011-09-02 DIAGNOSIS — E785 Hyperlipidemia, unspecified: Secondary | ICD-10-CM

## 2011-09-02 LAB — HEPATIC FUNCTION PANEL
AST: 29 U/L (ref 0–37)
Albumin: 4 g/dL (ref 3.5–5.2)
Alkaline Phosphatase: 49 U/L (ref 39–117)
Total Protein: 7.7 g/dL (ref 6.0–8.3)

## 2011-09-02 LAB — CBC WITH DIFFERENTIAL/PLATELET
Basophils Relative: 0.5 % (ref 0.0–3.0)
Eosinophils Relative: 1.6 % (ref 0.0–5.0)
HCT: 37.9 % (ref 36.0–46.0)
Hemoglobin: 12.8 g/dL (ref 12.0–15.0)
Lymphs Abs: 1.4 10*3/uL (ref 0.7–4.0)
Monocytes Relative: 7.8 % (ref 3.0–12.0)
Neutro Abs: 2.7 10*3/uL (ref 1.4–7.7)
RDW: 14.5 % (ref 11.5–14.6)

## 2011-09-02 LAB — BASIC METABOLIC PANEL
CO2: 30 mEq/L (ref 19–32)
Chloride: 103 mEq/L (ref 96–112)
Glucose, Bld: 97 mg/dL (ref 70–99)
Potassium: 4 mEq/L (ref 3.5–5.1)
Sodium: 140 mEq/L (ref 135–145)

## 2011-09-02 LAB — TSH: TSH: 1.28 u[IU]/mL (ref 0.35–5.50)

## 2011-09-02 MED ORDER — LORATADINE 10 MG PO TABS: 10.0000 mg | ORAL_TABLET | Freq: Every day | ORAL | Status: DC

## 2011-09-02 MED ORDER — FOLIC ACID 400 MCG PO TABS: 400.0000 ug | ORAL_TABLET | Freq: Every day | ORAL | Status: DC

## 2011-09-02 MED ORDER — POLYSACCHARIDE IRON COMPLEX 150 MG PO CAPS: 150.0000 mg | ORAL_CAPSULE | Freq: Three times a day (TID) | ORAL | Status: DC

## 2011-09-02 MED ORDER — OLMESARTAN-AMLODIPINE-HCTZ 40-10-25 MG PO TABS: 1.0000 | ORAL_TABLET | Freq: Every day | ORAL | Status: DC

## 2011-09-02 NOTE — Progress Notes (Signed)
  Subjective:    Patient ID: [name], female    DOB: 11-19-1970, 41 y.o.   MRN: [id number]  HPI  The patient presents for a follow-up of  chronic hypertension, chronic dyslipidemia, type 2 pre-diabetes, vit def, anemia     Review of Systems  Constitutional: Positive for fatigue. Negative for chills, activity change, appetite change and unexpected weight change.  HENT: Negative for congestion, mouth sores and sinus pressure.   Eyes: Negative for visual disturbance.  Respiratory: Negative for cough and chest tightness.   Gastrointestinal: Negative for nausea and abdominal pain.  Genitourinary: Negative for frequency, difficulty urinating and vaginal pain.  Musculoskeletal: Positive for arthralgias. Negative for back pain and gait problem.  Skin: Negative for pallor and rash.  Neurological: Negative for dizziness, tremors, weakness, numbness and headaches.  Psychiatric/Behavioral: Negative for confusion and sleep disturbance.   Wt Readings from Last 3 Encounters:  09/02/11 211 lb (95.709 kg)  10/01/10 208 lb (94.348 kg)  03/18/10 207 lb (93.895 kg)        Objective:   Physical Exam  Constitutional: She appears well-developed. No distress.       Obese   HENT:  Head: Normocephalic.  Right Ear: External ear normal.  Left Ear: External ear normal.  Nose: Nose normal.  Mouth/Throat: Oropharynx is clear and moist.  Eyes: Conjunctivae are normal. Pupils are equal, round, and reactive to light. Right eye exhibits no discharge. Left eye exhibits no discharge.  Neck: Normal range of motion. Neck supple. No JVD present. No tracheal deviation present. Thyromegaly present.  Cardiovascular: Normal rate, regular rhythm and normal heart sounds.   Pulmonary/Chest: No stridor. No respiratory distress. She has no wheezes.  Abdominal: Soft. Bowel sounds are normal. She exhibits no distension and no mass. There is no tenderness. There is no rebound and no guarding.  Musculoskeletal: She  exhibits no edema and no tenderness.  Lymphadenopathy:    She has no cervical adenopathy.  Neurological: She displays normal reflexes. No cranial nerve deficit. She exhibits normal muscle tone. Coordination normal.  Skin: No rash noted. No erythema.  Psychiatric: She has a normal mood and affect. Her behavior is normal. Judgment and thought content normal.          Assessment & Plan:

## 2011-09-02 NOTE — Assessment & Plan Note (Signed)
Per GYN 

## 2011-09-02 NOTE — Assessment & Plan Note (Signed)
Watching labs 

## 2011-09-02 NOTE — Assessment & Plan Note (Signed)
Check Vit D 

## 2011-09-02 NOTE — Assessment & Plan Note (Signed)
Get labs

## 2011-09-02 NOTE — Assessment & Plan Note (Signed)
Continue with current prescription therapy as reflected on the Med list.  

## 2011-09-02 NOTE — Assessment & Plan Note (Signed)
Check labs 

## 2011-09-04 ENCOUNTER — Telehealth: Payer: Self-pay | Admitting: *Deleted

## 2011-09-04 NOTE — Telephone Encounter (Signed)
Message copied by Merrilyn Puma on Thu Sep 04, 2011  8:08 AM ------      Message from: Janeal Holmes      Created: Wed Sep 03, 2011  5:16 PM       Misty Stanley, please, inform patient that all labs are OK      Thank you!

## 2011-09-04 NOTE — Telephone Encounter (Signed)
Left detailed mess informing pt of below.  

## 2011-09-04 NOTE — Telephone Encounter (Signed)
Left mess for patient to call back.  

## 2011-09-15 ENCOUNTER — Telehealth: Payer: Self-pay | Admitting: *Deleted

## 2011-09-15 NOTE — Telephone Encounter (Signed)
Rf req for Nuvigil 150 mg 1 1 po qam 1 hour prior to shift. Ok to Rf?

## 2011-09-15 NOTE — Telephone Encounter (Signed)
OK to fill this prescription with additional refills x5 Thank you!  

## 2011-09-16 MED ORDER — ARMODAFINIL 150 MG PO TABS: 150.0000 mg | ORAL_TABLET | Freq: Every day | ORAL | Status: DC

## 2011-10-31 ENCOUNTER — Other Ambulatory Visit: Payer: Self-pay | Admitting: Internal Medicine

## 2011-11-24 ENCOUNTER — Other Ambulatory Visit: Payer: Self-pay | Admitting: Internal Medicine

## 2011-11-27 ENCOUNTER — Encounter: Payer: Self-pay | Admitting: Internal Medicine

## 2011-11-27 ENCOUNTER — Ambulatory Visit (INDEPENDENT_AMBULATORY_CARE_PROVIDER_SITE_OTHER): Payer: Managed Care, Other (non HMO) | Admitting: Internal Medicine

## 2011-11-27 VITALS — BP 112/80 | HR 80 | Temp 97.4°F | Resp 16 | Wt 205.0 lb

## 2011-11-27 DIAGNOSIS — E538 Deficiency of other specified B group vitamins: Secondary | ICD-10-CM

## 2011-11-27 DIAGNOSIS — R5383 Other fatigue: Secondary | ICD-10-CM

## 2011-11-27 DIAGNOSIS — I10 Essential (primary) hypertension: Secondary | ICD-10-CM

## 2011-11-27 DIAGNOSIS — J309 Allergic rhinitis, unspecified: Secondary | ICD-10-CM

## 2011-11-27 DIAGNOSIS — E559 Vitamin D deficiency, unspecified: Secondary | ICD-10-CM

## 2011-11-27 DIAGNOSIS — Z23 Encounter for immunization: Secondary | ICD-10-CM

## 2011-11-27 DIAGNOSIS — R5381 Other malaise: Secondary | ICD-10-CM

## 2011-11-27 NOTE — Assessment & Plan Note (Signed)
Continue with current prescription therapy as reflected on the Med list.  

## 2011-11-27 NOTE — Progress Notes (Signed)
Patient ID: [name], female   DOB: Dec 26, 1970, 41 y.o.   MRN: [id number]  Subjective:    Patient ID: [name], female    DOB: 07-Jun-1971, 41 y.o.   MRN: [id number]  HPI  The patient presents for a follow-up of  chronic hypertension, chronic dyslipidemia, type 2 pre-diabetes, vit def, anemia   Wt Readings from Last 3 Encounters:  11/27/11 205 lb (92.987 kg)  09/02/11 211 lb (95.709 kg)  10/01/10 208 lb (94.348 kg)     Review of Systems  Constitutional: Positive for fatigue. Negative for chills, activity change, appetite change and unexpected weight change.  HENT: Negative for congestion, mouth sores and sinus pressure.   Eyes: Negative for visual disturbance.  Respiratory: Negative for cough and chest tightness.   Gastrointestinal: Negative for nausea and abdominal pain.  Genitourinary: Negative for frequency, difficulty urinating and vaginal pain.  Musculoskeletal: Positive for arthralgias. Negative for back pain and gait problem.  Skin: Negative for pallor and rash.  Neurological: Negative for dizziness, tremors, weakness, numbness and headaches.  Psychiatric/Behavioral: Negative for confusion and sleep disturbance.   Wt Readings from Last 3 Encounters:  11/27/11 205 lb (92.987 kg)  09/02/11 211 lb (95.709 kg)  10/01/10 208 lb (94.348 kg)        Objective:   Physical Exam  Constitutional: She appears well-developed. No distress.       Obese   HENT:  Head: Normocephalic.  Right Ear: External ear normal.  Left Ear: External ear normal.  Nose: Nose normal.  Mouth/Throat: Oropharynx is clear and moist.  Eyes: Conjunctivae are normal. Pupils are equal, round, and reactive to light. Right eye exhibits no discharge. Left eye exhibits no discharge.  Neck: Normal range of motion. Neck supple. No JVD present. No tracheal deviation present. Thyromegaly present.  Cardiovascular: Normal rate, regular rhythm and normal heart sounds.   Pulmonary/Chest: No stridor. No  respiratory distress. She has no wheezes.  Abdominal: Soft. Bowel sounds are normal. She exhibits no distension and no mass. There is no tenderness. There is no rebound and no guarding.  Musculoskeletal: She exhibits no edema and no tenderness.  Lymphadenopathy:    She has no cervical adenopathy.  Neurological: She displays normal reflexes. No cranial nerve deficit. She exhibits normal muscle tone. Coordination normal.  Skin: No rash noted. No erythema.  Psychiatric: She has a normal mood and affect. Her behavior is normal. Judgment and thought content normal.   Lab Results  Component Value Date   WBC 4.5 09/02/2011   HGB 12.8 09/02/2011   HCT 37.9 09/02/2011   PLT 133.0* 09/02/2011   GLUCOSE 97 09/02/2011   ALT 23 09/02/2011   AST 29 09/02/2011   NA 140 09/02/2011   K 4.0 09/02/2011   CL 103 09/02/2011   CREATININE 0.6 09/02/2011   BUN 13 09/02/2011   CO2 30 09/02/2011   TSH 1.28 09/02/2011   INR 0.9 RATIO 03/15/2007   HGBA1C 5.6 03/04/2010          Assessment & Plan:

## 2011-11-27 NOTE — Patient Instructions (Signed)
BP Readings from Last 3 Encounters:  11/27/11 112/80  09/02/11 120/80  10/01/10 120/78   Wt Readings from Last 3 Encounters:  11/27/11 205 lb (92.987 kg)  09/02/11 211 lb (95.709 kg)  10/01/10 208 lb (94.348 kg)

## 2011-11-27 NOTE — Assessment & Plan Note (Signed)
Chronic. 

## 2011-11-27 NOTE — Assessment & Plan Note (Signed)
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

## 2011-12-19 ENCOUNTER — Telehealth: Payer: Self-pay | Admitting: Internal Medicine

## 2011-12-19 MED ORDER — MONTELUKAST SODIUM 10 MG PO TABS: 10.0000 mg | ORAL_TABLET | Freq: Every day | ORAL | Status: DC

## 2011-12-19 NOTE — Telephone Encounter (Signed)
Add singulair 1 a day  Thx

## 2011-12-19 NOTE — Telephone Encounter (Signed)
The pt called and is hoping to get something stronger for her allergies called in.  She has been taking Claritin, but not getting relief.  Her callback number is [phone]

## 2011-12-19 NOTE — Telephone Encounter (Signed)
Pt informed

## 2012-03-01 ENCOUNTER — Encounter: Payer: Self-pay | Admitting: Internal Medicine

## 2012-03-01 ENCOUNTER — Ambulatory Visit (INDEPENDENT_AMBULATORY_CARE_PROVIDER_SITE_OTHER): Payer: Managed Care, Other (non HMO) | Admitting: Internal Medicine

## 2012-03-01 VITALS — BP 112/84 | HR 88 | Temp 98.2°F | Resp 16 | Wt 211.0 lb

## 2012-03-01 DIAGNOSIS — I10 Essential (primary) hypertension: Secondary | ICD-10-CM

## 2012-03-01 DIAGNOSIS — E538 Deficiency of other specified B group vitamins: Secondary | ICD-10-CM

## 2012-03-01 DIAGNOSIS — E559 Vitamin D deficiency, unspecified: Secondary | ICD-10-CM

## 2012-03-01 DIAGNOSIS — J40 Bronchitis, not specified as acute or chronic: Secondary | ICD-10-CM

## 2012-03-01 DIAGNOSIS — J209 Acute bronchitis, unspecified: Secondary | ICD-10-CM

## 2012-03-01 MED ORDER — PROMETHAZINE-CODEINE 6.25-10 MG/5ML PO SYRP: 5.0000 mL | ORAL_SOLUTION | ORAL | Status: AC | PRN

## 2012-03-01 MED ORDER — DOXYCYCLINE HYCLATE 100 MG PO TABS: 100.0000 mg | ORAL_TABLET | Freq: Two times a day (BID) | ORAL | Status: AC

## 2012-03-01 NOTE — Assessment & Plan Note (Signed)
Start abx Prom/cod

## 2012-03-01 NOTE — Assessment & Plan Note (Signed)
Continue with current prescription therapy as reflected on the Med list.  

## 2012-03-01 NOTE — Progress Notes (Signed)
Patient ID: [name], female   DOB: 07-Jan-1971, 41 y.o.   MRN: [id number] Patient ID: [name], female   DOB: 02-21-1971, 41 y.o.   MRN: [id number]  Subjective:    Patient ID: [name], female    DOB: 03-30-1971, 41 y.o.   MRN: [id number]  Cough This is a new problem. The current episode started in the past 7 days. The problem has been gradually worsening. The problem occurs constantly. The cough is productive of brown sputum. Pertinent negatives include no chills, headaches or rash. Nothing aggravates the symptoms.    The patient presents for a follow-up of  chronic hypertension, chronic dyslipidemia, type 2 pre-diabetes, vit def, anemia   Wt Readings from Last 3 Encounters:  03/01/12 211 lb (95.709 kg)  11/27/11 205 lb (92.987 kg)  09/02/11 211 lb (95.709 kg)     Review of Systems  Constitutional: Positive for fatigue. Negative for chills, activity change, appetite change and unexpected weight change.  HENT: Negative for congestion, mouth sores and sinus pressure.   Eyes: Negative for visual disturbance.  Respiratory: Positive for cough. Negative for chest tightness.   Gastrointestinal: Negative for nausea and abdominal pain.  Genitourinary: Negative for frequency, difficulty urinating and vaginal pain.  Musculoskeletal: Positive for arthralgias. Negative for back pain and gait problem.  Skin: Negative for pallor and rash.  Neurological: Negative for dizziness, tremors, weakness, numbness and headaches.  Psychiatric/Behavioral: Negative for confusion and disturbed wake/sleep cycle.   Wt Readings from Last 3 Encounters:  03/01/12 211 lb (95.709 kg)  11/27/11 205 lb (92.987 kg)  09/02/11 211 lb (95.709 kg)        Objective:   Physical Exam  Constitutional: She appears well-developed. No distress.       Obese   HENT:  Head: Normocephalic.  Right Ear: External ear normal.  Left Ear: External ear normal.  Nose: Nose normal.  Mouth/Throat: Oropharynx is  clear and moist.  Eyes: Conjunctivae are normal. Pupils are equal, round, and reactive to light. Right eye exhibits no discharge. Left eye exhibits no discharge.  Neck: Normal range of motion. Neck supple. No JVD present. No tracheal deviation present. Thyromegaly present.  Cardiovascular: Normal rate, regular rhythm and normal heart sounds.   Pulmonary/Chest: No stridor. No respiratory distress. She has no wheezes.       B ronchi  Abdominal: Soft. Bowel sounds are normal. She exhibits no distension and no mass. There is no tenderness. There is no rebound and no guarding.  Musculoskeletal: She exhibits no edema and no tenderness.  Lymphadenopathy:    She has no cervical adenopathy.  Neurological: She displays normal reflexes. No cranial nerve deficit. She exhibits normal muscle tone. Coordination normal.  Skin: No rash noted. No erythema.  Psychiatric: She has a normal mood and affect. Her behavior is normal. Judgment and thought content normal.   Lab Results  Component Value Date   WBC 4.5 09/02/2011   HGB 12.8 09/02/2011   HCT 37.9 09/02/2011   PLT 133.0* 09/02/2011   GLUCOSE 97 09/02/2011   ALT 23 09/02/2011   AST 29 09/02/2011   NA 140 09/02/2011   K 4.0 09/02/2011   CL 103 09/02/2011   CREATININE 0.6 09/02/2011   BUN 13 09/02/2011   CO2 30 09/02/2011   TSH 1.28 09/02/2011   INR 0.9 RATIO 03/15/2007   HGBA1C 5.6 03/04/2010          Assessment & Plan:

## 2012-03-01 NOTE — Patient Instructions (Addendum)
Use over-the-counter  "cold" medicines  such as"Afrin" nasal spray for nasal congestion as directed instead. Use" Delsym" or" Robitussin" cough syrup varietis for cough.  You can use plain "Tylenol" or "Advil' for fever, chills and achyness.   

## 2012-03-08 ENCOUNTER — Other Ambulatory Visit: Payer: Self-pay | Admitting: *Deleted

## 2012-03-08 DIAGNOSIS — Z0000 Encounter for general adult medical examination without abnormal findings: Secondary | ICD-10-CM

## 2012-03-29 ENCOUNTER — Other Ambulatory Visit (INDEPENDENT_AMBULATORY_CARE_PROVIDER_SITE_OTHER): Payer: Managed Care, Other (non HMO)

## 2012-03-29 DIAGNOSIS — Z23 Encounter for immunization: Secondary | ICD-10-CM

## 2012-03-29 DIAGNOSIS — J309 Allergic rhinitis, unspecified: Secondary | ICD-10-CM

## 2012-03-29 DIAGNOSIS — R5383 Other fatigue: Secondary | ICD-10-CM

## 2012-03-29 DIAGNOSIS — R5381 Other malaise: Secondary | ICD-10-CM

## 2012-03-29 DIAGNOSIS — Z0000 Encounter for general adult medical examination without abnormal findings: Secondary | ICD-10-CM

## 2012-03-29 DIAGNOSIS — E538 Deficiency of other specified B group vitamins: Secondary | ICD-10-CM

## 2012-03-29 DIAGNOSIS — I10 Essential (primary) hypertension: Secondary | ICD-10-CM

## 2012-03-29 DIAGNOSIS — E559 Vitamin D deficiency, unspecified: Secondary | ICD-10-CM

## 2012-03-29 LAB — URINALYSIS, ROUTINE W REFLEX MICROSCOPIC
Ketones, ur: NEGATIVE
Specific Gravity, Urine: 1.025 (ref 1.000–1.030)
Total Protein, Urine: NEGATIVE
Urine Glucose: NEGATIVE
Urobilinogen, UA: 0.2 (ref 0.0–1.0)
pH: 6 (ref 5.0–8.0)

## 2012-03-29 LAB — IBC PANEL: Iron: 39 ug/dL — ABNORMAL LOW (ref 42–145)

## 2012-03-29 LAB — VITAMIN B12: Vitamin B-12: >1500 pg/mL — ABNORMAL HIGH (ref 211–911)

## 2012-03-29 LAB — TSH: TSH: 1.46 u[IU]/mL (ref 0.35–5.50)

## 2012-03-29 LAB — CBC WITH DIFFERENTIAL/PLATELET
Eosinophils Absolute: 0.1 10*3/uL (ref 0.0–0.7)
MCHC: 32.1 g/dL (ref 30.0–36.0)
MCV: 81.9 fl (ref 78.0–100.0)
Monocytes Absolute: 0.4 10*3/uL (ref 0.1–1.0)
Neutrophils Relative %: 48.7 % (ref 43.0–77.0)
Platelets: 132 10*3/uL — ABNORMAL LOW (ref 150.0–400.0)

## 2012-03-29 LAB — BASIC METABOLIC PANEL
GFR: 135.67 mL/min (ref >60.00)
Potassium: 3.7 mEq/L (ref 3.5–5.1)
Sodium: 139 mEq/L (ref 135–145)

## 2012-03-29 LAB — HEPATIC FUNCTION PANEL
Alkaline Phosphatase: 47 U/L (ref 39–117)
Bilirubin, Direct: 0.1 mg/dL (ref 0.0–0.3)
Total Bilirubin: 0.2 mg/dL — ABNORMAL LOW (ref 0.3–1.2)
Total Protein: 7.3 g/dL (ref 6.0–8.3)

## 2012-03-29 LAB — LIPID PANEL
HDL: 61.5 mg/dL (ref >39.00)
Triglycerides: 36 mg/dL (ref 0.0–149.0)

## 2012-03-31 ENCOUNTER — Ambulatory Visit (INDEPENDENT_AMBULATORY_CARE_PROVIDER_SITE_OTHER): Payer: Managed Care, Other (non HMO) | Admitting: Internal Medicine

## 2012-03-31 ENCOUNTER — Encounter: Payer: Self-pay | Admitting: Internal Medicine

## 2012-03-31 VITALS — BP 110/72 | HR 80 | Temp 98.3°F | Resp 16 | Ht 63.0 in | Wt 209.0 lb

## 2012-03-31 DIAGNOSIS — E538 Deficiency of other specified B group vitamins: Secondary | ICD-10-CM

## 2012-03-31 DIAGNOSIS — M2550 Pain in unspecified joint: Secondary | ICD-10-CM

## 2012-03-31 DIAGNOSIS — I10 Essential (primary) hypertension: Secondary | ICD-10-CM

## 2012-03-31 DIAGNOSIS — D649 Anemia, unspecified: Secondary | ICD-10-CM

## 2012-03-31 DIAGNOSIS — Z0000 Encounter for general adult medical examination without abnormal findings: Secondary | ICD-10-CM | POA: Insufficient documentation

## 2012-03-31 DIAGNOSIS — E559 Vitamin D deficiency, unspecified: Secondary | ICD-10-CM

## 2012-03-31 DIAGNOSIS — D259 Leiomyoma of uterus, unspecified: Secondary | ICD-10-CM

## 2012-03-31 MED ORDER — ARMODAFINIL 150 MG PO TABS: 150.0000 mg | ORAL_TABLET | Freq: Every day | ORAL | Status: DC

## 2012-03-31 MED ORDER — ALIGN 4 MG PO CAPS: 1.0000 | ORAL_CAPSULE | Freq: Every day | ORAL | Status: DC

## 2012-03-31 MED ORDER — POLYSACCHARIDE IRON COMPLEX 150 MG PO CAPS: 150.0000 mg | ORAL_CAPSULE | Freq: Two times a day (BID) | ORAL | Status: DC

## 2012-03-31 NOTE — Assessment & Plan Note (Signed)
Continue with current prescription therapy as reflected on the Med list.  

## 2012-03-31 NOTE — Assessment & Plan Note (Signed)
Overall better 

## 2012-03-31 NOTE — Assessment & Plan Note (Signed)
Restart Niferex

## 2012-03-31 NOTE — Assessment & Plan Note (Signed)
We discussed age appropriate health related issues, including available/recomended screening tests and vaccinations. We discussed a need for adhering to healthy diet and exercise. Labs/EKG were reviewed/ordered. All questions were answered. She had a PAP

## 2012-03-31 NOTE — Progress Notes (Signed)
  Subjective:    Patient ID: [name], female    DOB: 1970-08-17, 41 y.o.   MRN: [id number]  HPI The patient is here for a wellness exam. The patient has been doing well overall without major physical or psychological issues going on lately, except for the root canal - on abx now... The patient presents for a follow-up of  chronic hypertension, chronic dyslipidemia, type 2 pre-diabetes, vit def, anemia   Wt Readings from Last 3 Encounters:  03/31/12 209 lb (94.802 kg)  03/01/12 211 lb (95.709 kg)  11/27/11 205 lb (92.987 kg)   BP Readings from Last 3 Encounters:  03/31/12 110/72  03/01/12 112/84  11/27/11 112/80     Review of Systems  Constitutional: Positive for fatigue. Negative for chills, activity change, appetite change and unexpected weight change.  HENT: Negative for congestion, mouth sores and sinus pressure.   Eyes: Negative for visual disturbance.  Respiratory: Negative for cough and chest tightness.   Gastrointestinal: Negative for nausea and abdominal pain.  Genitourinary: Negative for frequency, difficulty urinating and vaginal pain.  Musculoskeletal: Positive for arthralgias. Negative for back pain and gait problem.  Skin: Negative for pallor and rash.  Neurological: Negative for dizziness, tremors, weakness, numbness and headaches.  Psychiatric/Behavioral: Negative for confusion and disturbed wake/sleep cycle.         Objective:   Physical Exam  Constitutional: She appears well-developed. No distress.       Obese   HENT:  Head: Normocephalic.  Right Ear: External ear normal.  Left Ear: External ear normal.  Nose: Nose normal.  Mouth/Throat: Oropharynx is clear and moist.  Eyes: Conjunctivae are normal. Pupils are equal, round, and reactive to light. Right eye exhibits no discharge. Left eye exhibits no discharge.  Neck: Normal range of motion. Neck supple. No JVD present. No tracheal deviation present. Thyromegaly present.  Cardiovascular: Normal  rate, regular rhythm and normal heart sounds.   Pulmonary/Chest: No stridor. No respiratory distress. She has no wheezes.  Abdominal: Soft. Bowel sounds are normal. She exhibits no distension and no mass. There is no tenderness. There is no rebound and no guarding.  Musculoskeletal: She exhibits no edema and no tenderness.  Lymphadenopathy:    She has no cervical adenopathy.  Neurological: She displays normal reflexes. No cranial nerve deficit. She exhibits normal muscle tone. Coordination normal.  Skin: No rash noted. No erythema.  Psychiatric: She has a normal mood and affect. Her behavior is normal. Judgment and thought content normal.   Lab Results  Component Value Date   WBC 4.2* 03/29/2012   HGB 11.8* 03/29/2012   HCT 36.6 03/29/2012   PLT 132.0* 03/29/2012   GLUCOSE 89 03/29/2012   CHOL 164 03/29/2012   TRIG 36.0 03/29/2012   HDL 61.50 03/29/2012   LDLCALC 95 03/29/2012   ALT 21 03/29/2012   AST 26 03/29/2012   NA 139 03/29/2012   K 3.7 03/29/2012   CL 102 03/29/2012   CREATININE 0.6 03/29/2012   BUN 12 03/29/2012   CO2 27 03/29/2012   TSH 1.46 03/29/2012   INR 0.9 RATIO 03/15/2007   HGBA1C 5.6 03/29/2012          Assessment & Plan:

## 2012-03-31 NOTE — Assessment & Plan Note (Signed)
Discussed.

## 2012-03-31 NOTE — Assessment & Plan Note (Signed)
Continue with current prescription therapy as reflected on the Med list. Take B12 qod

## 2012-06-06 ENCOUNTER — Other Ambulatory Visit: Payer: Self-pay | Admitting: Internal Medicine

## 2012-06-07 ENCOUNTER — Other Ambulatory Visit: Payer: Self-pay | Admitting: Internal Medicine

## 2012-06-07 MED ORDER — LORATADINE 10 MG PO TABS: 10.0000 mg | ORAL_TABLET | Freq: Every day | ORAL | Status: DC

## 2012-07-06 ENCOUNTER — Other Ambulatory Visit (INDEPENDENT_AMBULATORY_CARE_PROVIDER_SITE_OTHER): Payer: Managed Care, Other (non HMO)

## 2012-07-06 DIAGNOSIS — E785 Hyperlipidemia, unspecified: Secondary | ICD-10-CM

## 2012-07-06 DIAGNOSIS — D649 Anemia, unspecified: Secondary | ICD-10-CM

## 2012-07-06 LAB — CBC WITH DIFFERENTIAL/PLATELET
Eosinophils Absolute: 0 10*3/uL (ref 0.0–0.7)
Eosinophils Relative: 0.4 % (ref 0.0–5.0)
Lymphocytes Relative: 21.4 % (ref 12.0–46.0)
MCHC: 32.4 g/dL (ref 30.0–36.0)
MCV: 82.1 fl (ref 78.0–100.0)
Monocytes Absolute: 0.4 10*3/uL (ref 0.1–1.0)
Neutrophils Relative %: 71.7 % (ref 43.0–77.0)
Platelets: 159 10*3/uL (ref 150.0–400.0)
WBC: 6.4 10*3/uL (ref 4.5–10.5)

## 2012-07-06 LAB — LIPID PANEL
HDL: 72.5 mg/dL (ref >39.00)
Total CHOL/HDL Ratio: 2

## 2012-07-06 LAB — IBC PANEL
Iron: 77 ug/dL (ref 42–145)
Transferrin: 277.6 mg/dL (ref 212.0–360.0)

## 2012-07-08 ENCOUNTER — Encounter: Payer: Self-pay | Admitting: Internal Medicine

## 2012-07-08 ENCOUNTER — Telehealth: Payer: Self-pay | Admitting: Internal Medicine

## 2012-07-08 ENCOUNTER — Ambulatory Visit (INDEPENDENT_AMBULATORY_CARE_PROVIDER_SITE_OTHER): Payer: Managed Care, Other (non HMO) | Admitting: Internal Medicine

## 2012-07-08 VITALS — BP 104/72 | HR 76 | Temp 98.0°F | Resp 16 | Wt 217.0 lb

## 2012-07-08 DIAGNOSIS — I10 Essential (primary) hypertension: Secondary | ICD-10-CM

## 2012-07-08 DIAGNOSIS — R7309 Other abnormal glucose: Secondary | ICD-10-CM

## 2012-07-08 DIAGNOSIS — E538 Deficiency of other specified B group vitamins: Secondary | ICD-10-CM

## 2012-07-08 DIAGNOSIS — R5383 Other fatigue: Secondary | ICD-10-CM | POA: Insufficient documentation

## 2012-07-08 DIAGNOSIS — R5381 Other malaise: Secondary | ICD-10-CM

## 2012-07-08 DIAGNOSIS — M2550 Pain in unspecified joint: Secondary | ICD-10-CM

## 2012-07-08 MED ORDER — VITAMIN B-12 1000 MCG PO TABS: 1000.0000 ug | ORAL_TABLET | ORAL | Status: DC

## 2012-07-08 NOTE — Telephone Encounter (Signed)
Patient Information:  Caller Name: [name]  Phone: [phone]  Patient: [name], [name]  Gender: Female  DOB: Mar 26, 1971  Age: 41 Years  PCP: Plotnikov, Alex (Adults only)  Pregnant: No   Symptoms  Reason For Call & Symptoms: Patient states she was in the office today 07/08/12 with Dr.Plotnikov for her physical.  She states her stomach felt funny in office. After leaving had pain in Left upper abdomen, Emesis x1 . Loose stool x1, chills and body aches.  Reviewed Health History In EMR: Yes  Reviewed Medications In EMR: Yes  Reviewed Allergies In EMR: Yes  Reviewed Surgeries / Procedures: No  Date of Onset of Symptoms: 07/08/2012  Treatments Tried: Drinking Gatoraide , crackers. emetrol  Treatments Tried Worked: No OB:  LMP: 06/21/2012  Guideline(s) Used:  Vomiting  Disposition Per Guideline:   Home Care  Reason For Disposition Reached:   Vomiting with diarrhea  Advice Given:  Reassurance:  Vomiting can be caused by many types of illnesses. It can be caused by a stomach flu virus. It can be caused by eating or drinking something that disagreed with your stomach.  Adults with vomiting need to stay hydrated. This is the most important thing. If you don't drink and replace lost fluids, you may get dehydrated.  You can treat vomiting, even if there is mild dehydration, at home.  Here is some care advice that should help.  Clear Liquids:  Sip water or a rehydration drink (e.g., Gatorade or Powerade).  Other options: 1/2 strength flat lemon-lime soda or ginger ale.  After 4 hours without vomiting, increase the amount.  For Non-stop Vomiting, Try Sleeping:  Try to go to sleep (Reason: sleep often empties the stomach and relieves the need to vomit).  When you awaken, resume drinking liquids. Water works best initially.  Expected Course:  Vomiting from viral gastritis usually stops in 12 to 48 hours.  If diarrhea is present, it usually lasts for several days.  People with mild  dehydration can usually treat themselves at home, by drinking more liquids.  People with moderate to severe dehydration may need medical care. signs of this include very dry mouth, dizziness, weakness, and decreased urination.  Call Back If:  Vomiting lasts for more than 2 days (48 hours)  Signs of dehydration occur  You become worse.  Office Follow Up:  Does the office need to follow up with this patient?: No  Instructions For The Office: N/A

## 2012-07-08 NOTE — Assessment & Plan Note (Signed)
Wt Readings from Last 3 Encounters:  07/08/12 217 lb (98.431 kg)  03/31/12 209 lb (94.802 kg)  03/01/12 211 lb (95.709 kg)

## 2012-07-08 NOTE — Assessment & Plan Note (Addendum)
Restart B12 - she stopped

## 2012-07-08 NOTE — Assessment & Plan Note (Signed)
Continue with current prescription therapy as reflected on the Med list.  

## 2012-07-08 NOTE — Assessment & Plan Note (Signed)
Watching  

## 2012-07-08 NOTE — Progress Notes (Signed)
   Subjective:    Patient ID: [name], female    DOB: [birth year], 41 y.o.   MRN: [id number]  HPI   The patient presents for a follow-up of  chronic hypertension, FMS, chronic dyslipidemia, type 2 pre-diabetes, vit def, anemia   Wt Readings from Last 3 Encounters:  07/08/12 217 lb (98.431 kg)  03/31/12 209 lb (94.802 kg)  03/01/12 211 lb (95.709 kg)   BP Readings from Last 3 Encounters:  07/08/12 104/72  03/31/12 110/72  03/01/12 112/84     Review of Systems  Constitutional: Positive for fatigue. Negative for chills, activity change, appetite change and unexpected weight change.  HENT: Negative for congestion, mouth sores and sinus pressure.   Eyes: Negative for visual disturbance.  Respiratory: Negative for cough and chest tightness.   Gastrointestinal: Negative for nausea and abdominal pain.  Genitourinary: Negative for frequency, difficulty urinating and vaginal pain.  Musculoskeletal: Positive for arthralgias. Negative for back pain and gait problem.  Skin: Negative for pallor and rash.  Neurological: Negative for dizziness, tremors, weakness, numbness and headaches.  Psychiatric/Behavioral: Negative for confusion and sleep disturbance.         Objective:   Physical Exam  Constitutional: She appears well-developed. No distress.       Obese   HENT:  Head: Normocephalic.  Right Ear: External ear normal.  Left Ear: External ear normal.  Nose: Nose normal.  Mouth/Throat: Oropharynx is clear and moist.  Eyes: Conjunctivae normal are normal. Pupils are equal, round, and reactive to light. Right eye exhibits no discharge. Left eye exhibits no discharge.  Neck: Normal range of motion. Neck supple. No JVD present. No tracheal deviation present. Thyromegaly present.  Cardiovascular: Normal rate, regular rhythm and normal heart sounds.   Pulmonary/Chest: No stridor. No respiratory distress. She has no wheezes.  Abdominal: Soft. Bowel sounds are normal. She exhibits  no distension and no mass. There is no tenderness. There is no rebound and no guarding.  Musculoskeletal: She exhibits no edema and no tenderness.  Lymphadenopathy:    She has no cervical adenopathy.  Neurological: She displays normal reflexes. No cranial nerve deficit. She exhibits normal muscle tone. Coordination normal.  Skin: No rash noted. No erythema.  Psychiatric: She has a normal mood and affect. Her behavior is normal. Judgment and thought content normal.   Lab Results  Component Value Date   WBC 6.4 07/06/2012   HGB 12.3 07/06/2012   HCT 38.0 07/06/2012   PLT 159.0 07/06/2012   GLUCOSE 89 03/29/2012   CHOL 180 07/06/2012   TRIG 76.0 07/06/2012   HDL 72.50 07/06/2012   LDLCALC 92 07/06/2012   ALT 21 03/29/2012   AST 26 03/29/2012   NA 139 03/29/2012   K 3.7 03/29/2012   CL 102 03/29/2012   CREATININE 0.6 03/29/2012   BUN 12 03/29/2012   CO2 27 03/29/2012   TSH 1.46 03/29/2012   INR 0.9 RATIO 03/15/2007   HGBA1C 5.6 03/29/2012          Assessment & Plan:

## 2012-07-08 NOTE — Assessment & Plan Note (Signed)
Home seep study was suggested

## 2012-07-09 NOTE — Telephone Encounter (Signed)
Noted Agree w/rec Thx

## 2012-07-18 ENCOUNTER — Other Ambulatory Visit: Payer: Self-pay | Admitting: Internal Medicine

## 2012-09-20 ENCOUNTER — Other Ambulatory Visit (INDEPENDENT_AMBULATORY_CARE_PROVIDER_SITE_OTHER): Payer: Managed Care, Other (non HMO)

## 2012-09-20 ENCOUNTER — Encounter: Payer: Self-pay | Admitting: Internal Medicine

## 2012-09-20 ENCOUNTER — Ambulatory Visit (INDEPENDENT_AMBULATORY_CARE_PROVIDER_SITE_OTHER): Payer: Managed Care, Other (non HMO) | Admitting: Internal Medicine

## 2012-09-20 VITALS — BP 110/76 | HR 72 | Temp 97.1°F | Resp 16 | Wt 215.0 lb

## 2012-09-20 DIAGNOSIS — R5381 Other malaise: Secondary | ICD-10-CM

## 2012-09-20 DIAGNOSIS — E559 Vitamin D deficiency, unspecified: Secondary | ICD-10-CM

## 2012-09-20 DIAGNOSIS — I10 Essential (primary) hypertension: Secondary | ICD-10-CM

## 2012-09-20 DIAGNOSIS — R5383 Other fatigue: Secondary | ICD-10-CM

## 2012-09-20 DIAGNOSIS — J309 Allergic rhinitis, unspecified: Secondary | ICD-10-CM

## 2012-09-20 DIAGNOSIS — E538 Deficiency of other specified B group vitamins: Secondary | ICD-10-CM

## 2012-09-20 DIAGNOSIS — D509 Iron deficiency anemia, unspecified: Secondary | ICD-10-CM

## 2012-09-20 LAB — BASIC METABOLIC PANEL
BUN: 12 mg/dL (ref 6–23)
Calcium: 9.1 mg/dL (ref 8.4–10.5)
Creatinine, Ser: 0.6 mg/dL (ref 0.4–1.2)
GFR: 132.88 mL/min (ref >60.00)

## 2012-09-20 LAB — CBC WITH DIFFERENTIAL/PLATELET
Basophils Absolute: 0 10*3/uL (ref 0.0–0.1)
HCT: 36.4 % (ref 36.0–46.0)
Lymphocytes Relative: 33.2 % (ref 12.0–46.0)
Lymphs Abs: 1.2 10*3/uL (ref 0.7–4.0)
Monocytes Relative: 8.6 % (ref 3.0–12.0)
Neutrophils Relative %: 54.5 % (ref 43.0–77.0)
Platelets: 138 10*3/uL — ABNORMAL LOW (ref 150.0–400.0)
RDW: 14.9 % — ABNORMAL HIGH (ref 11.5–14.6)

## 2012-09-20 LAB — HEPATIC FUNCTION PANEL
ALT: 21 U/L (ref 0–35)
AST: 26 U/L (ref 0–37)
Alkaline Phosphatase: 41 U/L (ref 39–117)
Bilirubin, Direct: 0.1 mg/dL (ref 0.0–0.3)
Total Protein: 7.1 g/dL (ref 6.0–8.3)

## 2012-09-20 LAB — HEMOGLOBIN A1C: Hgb A1c MFr Bld: 5.8 % (ref 4.6–6.5)

## 2012-09-20 MED ORDER — PHENTERMINE HCL 37.5 MG PO TABS: 37.5000 mg | ORAL_TABLET | Freq: Every day | ORAL | Status: DC

## 2012-09-20 NOTE — Progress Notes (Signed)
   Subjective:    HPI The patient has been doing well overall without major physical or psychological issues going on lately, except for fatigue - worse The patient presents for a follow-up of  chronic hypertension, chronic dyslipidemia, type 2 pre-diabetes, vit def, anemia. C/o obesity  Wt Readings from Last 3 Encounters:  09/20/12 215 lb (97.523 kg)  07/08/12 217 lb (98.431 kg)  03/31/12 209 lb (94.802 kg)   BP Readings from Last 3 Encounters:  09/20/12 110/76  07/08/12 104/72  03/31/12 110/72     Review of Systems  Constitutional: Positive for fatigue. Negative for chills, activity change, appetite change and unexpected weight change.  HENT: Negative for congestion, mouth sores and sinus pressure.   Eyes: Negative for visual disturbance.  Respiratory: Negative for cough and chest tightness.   Gastrointestinal: Negative for nausea and abdominal pain.  Genitourinary: Negative for frequency, difficulty urinating and vaginal pain.  Musculoskeletal: Positive for arthralgias. Negative for back pain and gait problem.  Skin: Negative for pallor and rash.  Neurological: Negative for dizziness, tremors, weakness, numbness and headaches.  Psychiatric/Behavioral: Negative for confusion and sleep disturbance.         Objective:   Physical Exam  Constitutional: She appears well-developed. No distress.  Obese   HENT:  Head: Normocephalic.  Right Ear: External ear normal.  Left Ear: External ear normal.  Nose: Nose normal.  Mouth/Throat: Oropharynx is clear and moist.  Eyes: Conjunctivae are normal. Pupils are equal, round, and reactive to light. Right eye exhibits no discharge. Left eye exhibits no discharge.  Neck: Normal range of motion. Neck supple. No JVD present. No tracheal deviation present. Thyromegaly present.  Cardiovascular: Normal rate, regular rhythm and normal heart sounds.   Pulmonary/Chest: No stridor. No respiratory distress. She has no wheezes.  Abdominal:  Soft. Bowel sounds are normal. She exhibits no distension and no mass. There is no tenderness. There is no rebound and no guarding.  Musculoskeletal: She exhibits no edema and no tenderness.  Lymphadenopathy:    She has no cervical adenopathy.  Neurological: She displays normal reflexes. No cranial nerve deficit. She exhibits normal muscle tone. Coordination normal.  Skin: No rash noted. No erythema.  Psychiatric: She has a normal mood and affect. Her behavior is normal. Judgment and thought content normal.   Lab Results  Component Value Date   WBC 6.4 07/06/2012   HGB 12.3 07/06/2012   HCT 38.0 07/06/2012   PLT 159.0 07/06/2012   GLUCOSE 89 03/29/2012   CHOL 180 07/06/2012   TRIG 76.0 07/06/2012   HDL 72.50 07/06/2012   LDLCALC 92 07/06/2012   ALT 21 03/29/2012   AST 26 03/29/2012   NA 139 03/29/2012   K 3.7 03/29/2012   CL 102 03/29/2012   CREATININE 0.6 03/29/2012   BUN 12 03/29/2012   CO2 27 03/29/2012   TSH 1.46 03/29/2012   INR 0.9 RATIO 03/15/2007   HGBA1C 5.6 03/29/2012          Assessment & Plan:

## 2012-09-20 NOTE — Assessment & Plan Note (Signed)
Labs

## 2012-09-20 NOTE — Assessment & Plan Note (Signed)
CBC

## 2012-09-20 NOTE — Assessment & Plan Note (Signed)
Labs Multifactorial (shift work etc) 2/14 - worse Sleeping 5 h a day. 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 He had a nl sleep test a while ago

## 2012-09-20 NOTE — Assessment & Plan Note (Signed)
Continue with current prescription therapy as reflected on the Med list.  

## 2012-09-20 NOTE — Assessment & Plan Note (Addendum)
Wt Readings from Last 3 Encounters:  09/20/12 215 lb (97.523 kg)  07/08/12 217 lb (98.431 kg)  03/31/12 209 lb (94.802 kg)   Will try Adipex per pt's request

## 2012-10-16 ENCOUNTER — Other Ambulatory Visit: Payer: Self-pay | Admitting: Internal Medicine

## 2012-10-20 ENCOUNTER — Telehealth: Payer: Self-pay | Admitting: Internal Medicine

## 2012-10-20 NOTE — Telephone Encounter (Signed)
Patient Information:  Caller Name: Monette  Phone: [phone]  Patient: [name], [name]  Gender: Female  DOB: [birth year]  Age: 42 Years  PCP: Plotnikov, Alex (Adults only)  Pregnant: No  Office Follow Up:  Does the office need to follow up with this patient?: No  Instructions For The Office: N/A   Symptoms  Reason For Call & Symptoms: Pt states she has fever and body aches.  Reviewed Health History In EMR: Yes  Reviewed Medications In EMR: Yes  Reviewed Allergies In EMR: Yes  Reviewed Surgeries / Procedures: Yes  Date of Onset of Symptoms: 10/19/2012  Treatments Tried: Advil  Treatments Tried Worked: Yes  Any Fever: Yes  Fever Taken: Oral  Fever Time Of Reading: 00:00:00  Fever Last Reading: 101.5 OB / GYN:  LMP: 10/12/2012  Guideline(s) Used:  Sinus Pain and Congestion  Disposition Per Guideline:   See Today in Office  Reason For Disposition Reached:   Sinus pain (not just congestion) and fever  Advice Given:  Reassurance:   Sinus congestion is a normal part of a cold.  Usually home treatment with nasal washes can prevent an actual bacterial sinus infection.  Here is some care advice that should help.  Pain and Fever Medicines:  For pain or fever relief, take either acetaminophen or ibuprofen.  Treat fevers above 101 F (38.3 C). The goal of fever therapy is to bring the fever down to a comfortable level. Remember that fever medicine usually lowers fever 2 degrees F (1 - 1 1/2 degrees C).  Ibuprofen (e.g., Motrin, Advil):  Take 400 mg (two 200 mg pills) by mouth every 6 hours.  Hydration:  Drink plenty of liquids (6-8 glasses of water daily). If the air in your home is dry, use a cool mist humidifier  Call Back If:   Severe pain lasts longer than 2 hours after pain medicine  You become worse.  Patient Will Follow Care Advice:  YES  Appointment Scheduled:  10/21/2012 16:00:00 Appointment Scheduled Provider:  Sonda Primes (Adults only)

## 2012-10-21 ENCOUNTER — Encounter (HOSPITAL_COMMUNITY): Payer: Self-pay

## 2012-10-21 ENCOUNTER — Encounter: Payer: Self-pay | Admitting: Internal Medicine

## 2012-10-21 ENCOUNTER — Inpatient Hospital Stay (HOSPITAL_COMMUNITY)
Admission: EM | Admit: 2012-10-21 | Discharge: 2012-10-23 | DRG: 392 | Disposition: A | Discharge status: Home / Self Care | Payer: Managed Care, Other (non HMO) | Attending: Family Medicine | Admitting: Family Medicine

## 2012-10-21 ENCOUNTER — Emergency Department (HOSPITAL_COMMUNITY): Payer: Managed Care, Other (non HMO)

## 2012-10-21 ENCOUNTER — Ambulatory Visit (INDEPENDENT_AMBULATORY_CARE_PROVIDER_SITE_OTHER): Payer: Managed Care, Other (non HMO) | Admitting: Internal Medicine

## 2012-10-21 VITALS — BP 102/60 | HR 105 | Temp 100.6°F | Wt 213.0 lb

## 2012-10-21 DIAGNOSIS — D259 Leiomyoma of uterus, unspecified: Secondary | ICD-10-CM

## 2012-10-21 DIAGNOSIS — R1031 Right lower quadrant pain: Secondary | ICD-10-CM | POA: Insufficient documentation

## 2012-10-21 DIAGNOSIS — R0602 Shortness of breath: Secondary | ICD-10-CM

## 2012-10-21 DIAGNOSIS — R7309 Other abnormal glucose: Secondary | ICD-10-CM

## 2012-10-21 DIAGNOSIS — E278 Other specified disorders of adrenal gland: Secondary | ICD-10-CM | POA: Diagnosis present

## 2012-10-21 DIAGNOSIS — R110 Nausea: Secondary | ICD-10-CM

## 2012-10-21 DIAGNOSIS — J309 Allergic rhinitis, unspecified: Secondary | ICD-10-CM

## 2012-10-21 DIAGNOSIS — D509 Iron deficiency anemia, unspecified: Secondary | ICD-10-CM

## 2012-10-21 DIAGNOSIS — N3000 Acute cystitis without hematuria: Secondary | ICD-10-CM

## 2012-10-21 DIAGNOSIS — N83201 Unspecified ovarian cyst, right side: Secondary | ICD-10-CM | POA: Diagnosis present

## 2012-10-21 DIAGNOSIS — R000 Tachycardia, unspecified: Secondary | ICD-10-CM

## 2012-10-21 DIAGNOSIS — K529 Noninfective gastroenteritis and colitis, unspecified: Secondary | ICD-10-CM | POA: Insufficient documentation

## 2012-10-21 DIAGNOSIS — E876 Hypokalemia: Secondary | ICD-10-CM

## 2012-10-21 DIAGNOSIS — E538 Deficiency of other specified B group vitamins: Secondary | ICD-10-CM

## 2012-10-21 DIAGNOSIS — J209 Acute bronchitis, unspecified: Secondary | ICD-10-CM

## 2012-10-21 DIAGNOSIS — M545 Low back pain, unspecified: Secondary | ICD-10-CM

## 2012-10-21 DIAGNOSIS — K5289 Other specified noninfective gastroenteritis and colitis: Principal | ICD-10-CM

## 2012-10-21 DIAGNOSIS — I10 Essential (primary) hypertension: Secondary | ICD-10-CM

## 2012-10-21 DIAGNOSIS — R509 Fever, unspecified: Secondary | ICD-10-CM

## 2012-10-21 DIAGNOSIS — M2550 Pain in unspecified joint: Secondary | ICD-10-CM

## 2012-10-21 DIAGNOSIS — E279 Disorder of adrenal gland, unspecified: Secondary | ICD-10-CM

## 2012-10-21 DIAGNOSIS — R209 Unspecified disturbances of skin sensation: Secondary | ICD-10-CM

## 2012-10-21 DIAGNOSIS — R5383 Other fatigue: Secondary | ICD-10-CM

## 2012-10-21 DIAGNOSIS — M79609 Pain in unspecified limb: Secondary | ICD-10-CM

## 2012-10-21 DIAGNOSIS — L258 Unspecified contact dermatitis due to other agents: Secondary | ICD-10-CM

## 2012-10-21 DIAGNOSIS — N83209 Unspecified ovarian cyst, unspecified side: Secondary | ICD-10-CM | POA: Diagnosis present

## 2012-10-21 DIAGNOSIS — L980 Pyogenic granuloma: Secondary | ICD-10-CM

## 2012-10-21 DIAGNOSIS — E669 Obesity, unspecified: Secondary | ICD-10-CM | POA: Diagnosis present

## 2012-10-21 DIAGNOSIS — D649 Anemia, unspecified: Secondary | ICD-10-CM

## 2012-10-21 DIAGNOSIS — J45909 Unspecified asthma, uncomplicated: Secondary | ICD-10-CM | POA: Diagnosis present

## 2012-10-21 DIAGNOSIS — E559 Vitamin D deficiency, unspecified: Secondary | ICD-10-CM

## 2012-10-21 DIAGNOSIS — Z79899 Other long term (current) drug therapy: Secondary | ICD-10-CM

## 2012-10-21 DIAGNOSIS — Z0000 Encounter for general adult medical examination without abnormal findings: Secondary | ICD-10-CM

## 2012-10-21 DIAGNOSIS — L659 Nonscarring hair loss, unspecified: Secondary | ICD-10-CM

## 2012-10-21 LAB — CBC WITH DIFFERENTIAL/PLATELET
Basophils Absolute: 0 10*3/uL (ref 0.0–0.1)
Basophils Relative: 0 % (ref 0–1)
Eosinophils Relative: 0 % (ref 0–5)
HCT: 34.4 % — ABNORMAL LOW (ref 36.0–46.0)
Hemoglobin: 11.3 g/dL — ABNORMAL LOW (ref 12.0–15.0)
Lymphocytes Relative: 14 % (ref 12–46)
MCHC: 32.8 g/dL (ref 30.0–36.0)
MCV: 80.6 fL (ref 78.0–100.0)
Monocytes Absolute: 0.6 10*3/uL (ref 0.1–1.0)
Monocytes Relative: 11 % (ref 3–12)
Neutro Abs: 4 10*3/uL (ref 1.7–7.7)
RDW: 14 % (ref 11.5–15.5)

## 2012-10-21 LAB — URINALYSIS, ROUTINE W REFLEX MICROSCOPIC
Bilirubin Urine: NEGATIVE
Ketones, ur: NEGATIVE mg/dL
Nitrite: NEGATIVE
Specific Gravity, Urine: 1.022 (ref 1.005–1.030)
pH: 6.5 (ref 5.0–8.0)

## 2012-10-21 LAB — POCT I-STAT, CHEM 8
Calcium, Ion: 1.09 mmol/L — ABNORMAL LOW (ref 1.12–1.23)
Chloride: 99 mEq/L (ref 96–112)
Creatinine, Ser: 1 mg/dL (ref 0.50–1.10)
Glucose, Bld: 104 mg/dL — ABNORMAL HIGH (ref 70–99)
Potassium: 3 mEq/L — ABNORMAL LOW (ref 3.5–5.1)

## 2012-10-21 LAB — HEPATIC FUNCTION PANEL
ALT: 14 U/L (ref 0–35)
AST: 22 U/L (ref 0–37)
Albumin: 2.9 g/dL — ABNORMAL LOW (ref 3.5–5.2)
Total Protein: 7.4 g/dL (ref 6.0–8.3)

## 2012-10-21 LAB — URINE MICROSCOPIC-ADD ON

## 2012-10-21 MED ORDER — KETOROLAC TROMETHAMINE 60 MG/2ML IM SOLN
60.0000 mg | Freq: Once | INTRAMUSCULAR | Status: AC
  Administered 2012-10-21: 60 mg via INTRAMUSCULAR

## 2012-10-21 MED ORDER — ONDANSETRON HCL 4 MG/2ML IJ SOLN
4.0000 mg | Freq: Once | INTRAMUSCULAR | Status: AC
  Administered 2012-10-21: 4 mg via INTRAVENOUS
  Filled 2012-10-21: qty 2

## 2012-10-21 MED ORDER — ACETAMINOPHEN 325 MG PO TABS
650.0000 mg | ORAL_TABLET | Freq: Once | ORAL | Status: AC
Start: 2012-10-21 — End: 2012-10-21
  Administered 2012-10-21: 650 mg via ORAL
  Filled 2012-10-21: qty 2

## 2012-10-21 MED ORDER — MORPHINE SULFATE 4 MG/ML IJ SOLN
4.0000 mg | INTRAMUSCULAR | Status: DC | PRN
  Administered 2012-10-21: 4 mg via INTRAVENOUS
  Filled 2012-10-21 (×2): qty 1

## 2012-10-21 MED ORDER — IOHEXOL 300 MG/ML  SOLN
100.0000 mL | Freq: Once | INTRAMUSCULAR | Status: AC | PRN
  Administered 2012-10-21: 100 mL via INTRAVENOUS

## 2012-10-21 MED ORDER — POTASSIUM CHLORIDE CRYS ER 20 MEQ PO TBCR
40.0000 meq | EXTENDED_RELEASE_TABLET | Freq: Once | ORAL | Status: AC
  Administered 2012-10-21: 40 meq via ORAL
  Filled 2012-10-21: qty 2

## 2012-10-21 MED ORDER — IOHEXOL 300 MG/ML  SOLN
50.0000 mL | Freq: Once | INTRAMUSCULAR | Status: AC | PRN
  Administered 2012-10-21: 50 mL via ORAL

## 2012-10-21 MED ORDER — METRONIDAZOLE 500 MG PO TABS
500.0000 mg | ORAL_TABLET | Freq: Once | ORAL | Status: AC
  Administered 2012-10-21: 500 mg via ORAL
  Filled 2012-10-21: qty 1

## 2012-10-21 MED ORDER — CIPROFLOXACIN IN D5W 400 MG/200ML IV SOLN
400.0000 mg | Freq: Two times a day (BID) | INTRAVENOUS | Status: DC
  Administered 2012-10-21: 400 mg via INTRAVENOUS
  Filled 2012-10-21 (×2): qty 200

## 2012-10-21 NOTE — Progress Notes (Signed)
Subjective:    Fever  This is a new problem. The current episode started yesterday. The problem occurs constantly. The problem has been gradually worsening. The maximum temperature noted was 100 to 100.9 F. Associated symptoms include abdominal pain and diarrhea (on Mon-Tue). Pertinent negatives include no congestion, coughing, headaches, nausea, rash or vomiting. Associated symptoms comments: Gas d/c is foul smelling x 2 d. She has tried acetaminophen for the symptoms. The treatment provided no relief.  Abdominal Pain This is a new problem. The current episode started yesterday. The onset quality is gradual. The problem occurs constantly. The problem has been gradually worsening. The pain is located in the RLQ, periumbilical region and suprapubic region. The pain is at a severity of 8/10. The pain is severe. The quality of the pain is colicky and burning. The abdominal pain does not radiate. Associated symptoms include anorexia, arthralgias, diarrhea (on Mon-Tue), a fever and myalgias. Pertinent negatives include no frequency, headaches, hematochezia, hematuria, melena, nausea or vomiting. The pain is aggravated by certain positions. The pain is relieved by nothing. She has tried acetaminophen for the symptoms. The treatment provided no relief. There is no history of ulcerative colitis.   The patient has been doing well overall without major physical or psychological issues going on lately, except for fatigue - worse The patient presents for a follow-up of  chronic hypertension, chronic dyslipidemia, type 2 pre-diabetes, vit def, anemia. C/o obesity  Wt Readings from Last 3 Encounters:  10/21/12 213 lb (96.616 kg)  09/20/12 215 lb (97.523 kg)  07/08/12 217 lb (98.431 kg)   BP Readings from Last 3 Encounters:  10/21/12 102/60  09/20/12 110/76  07/08/12 104/72     Review of Systems  Constitutional: Positive for fever and fatigue. Negative for chills, activity change, appetite change and  unexpected weight change.  HENT: Negative for congestion, mouth sores and sinus pressure.   Eyes: Negative for visual disturbance.  Respiratory: Negative for cough and chest tightness.   Gastrointestinal: Positive for abdominal pain, diarrhea (on Mon-Tue) and anorexia. Negative for nausea, vomiting, melena and hematochezia.  Genitourinary: Negative for frequency, hematuria, difficulty urinating and vaginal pain.  Musculoskeletal: Positive for myalgias and arthralgias. Negative for back pain and gait problem.  Skin: Negative for pallor and rash.  Neurological: Negative for dizziness, tremors, weakness, numbness and headaches.  Psychiatric/Behavioral: Negative for confusion and sleep disturbance.         Objective:   Physical Exam  Constitutional: She appears well-developed. No distress.  Obese Looks tired   HENT:  Head: Normocephalic.  Right Ear: External ear normal.  Left Ear: External ear normal.  Nose: Nose normal.  Mouth/Throat: Oropharynx is clear and moist.  Coated tongue  Eyes: Conjunctivae are normal. Pupils are equal, round, and reactive to light. Right eye exhibits no discharge. Left eye exhibits no discharge.  Neck: Normal range of motion. Neck supple. No JVD present. No tracheal deviation present. Thyromegaly present.  Cardiovascular: Normal rate, regular rhythm and normal heart sounds.   tachy  Pulmonary/Chest: No stridor. No respiratory distress. She has no wheezes.  Abdominal: Soft. Bowel sounds are normal. She exhibits no distension and no mass. There is tenderness (RLQ). There is rebound and guarding.  Musculoskeletal: She exhibits no edema and no tenderness.  Lymphadenopathy:    She has no cervical adenopathy.  Neurological: She displays normal reflexes. No cranial nerve deficit. She exhibits normal muscle tone. Coordination normal.  Skin: No rash noted. No erythema.  Psychiatric: She has a normal  mood and affect. Her behavior is normal. Judgment and thought  content normal.   Lab Results  Component Value Date   WBC 3.6* 09/20/2012   HGB 12.1 09/20/2012   HCT 36.4 09/20/2012   PLT 138.0* 09/20/2012   GLUCOSE 94 09/20/2012   CHOL 180 07/06/2012   TRIG 76.0 07/06/2012   HDL 72.50 07/06/2012   LDLCALC 92 07/06/2012   ALT 21 09/20/2012   AST 26 09/20/2012   NA 139 09/20/2012   K 3.8 09/20/2012   CL 103 09/20/2012   CREATININE 0.6 09/20/2012   BUN 12 09/20/2012   CO2 29 09/20/2012   TSH 0.67 09/20/2012   INR 0.9 RATIO 03/15/2007   HGBA1C 5.8 09/20/2012          Assessment & Plan:

## 2012-10-21 NOTE — Assessment & Plan Note (Signed)
3/14 R/o appendicitis vs other To [facility] ER --- discussed

## 2012-10-21 NOTE — H&P (Signed)
[facility] History and Physical  Deshonda Cryderman QMV:[id number] DOB: 08-24-1970 DOA: 10/21/2012  Referring physician: Dr. Lynelle Doctor PCP: Sonda Primes, MD  Specialists:   Chief Complaint: Right-sided abdominal pain  HPI: [name] is a 42 y.o. female with past medical history as listed below who presents with c/o of right-sided abdominal pain x2 days and fevers. She states that she also had diarrhea for 2 days-3-4 episodes a day but now resolved. She denies nausea ,vomiting. She denies dysuria, cough melena no hematochezia. Patient denies any recent antibiotic use. She was seen in the ED and a CT scan of her abdomen and pelvis revealed extensive edema and inflammation around the right colon suggestive of of colitis. Also a possible right ovarian cyst 3.4 cm- complex versus hemorrhagic was noted, and right adrenal nodules with largest 2.7 cm.   Review of Systems: The patient denies anorexia, fever, weight loss,, vision loss, decreased hearing, hoarseness, chest pain, syncope, dyspnea on exertion, peripheral edema, balance deficits, hemoptysis, melena, hematochezia, severe indigestion/heartburn, hematuria, incontinence, genital sores, muscle weakness, suspicious skin lesions, transient blindness, difficulty walking, depression, unusual weight change, abnormal bleeding,   Past Medical History  Diagnosis Date  . Anemia     iron deficiency  . Leiomyoma of uterus, unspecified   . Unspecified vitamin D deficiency   . Nausea alone   . Iron deficiency anemia, unspecified   . Other B-complex deficiencies   . Anemia, unspecified   . Leiomyoma of uterus, unspecified   . Allergy     rhinitis  . Hypertension   . Obesity   . Asthma     asthma  . Meralgia paresthetica    Past Surgical History  Procedure Laterality Date  . Myomectomy    . Fibriodidectomy  2009   Social History:  reports that she has never smoked. She does not have any smokeless tobacco history on file. She reports  that she does not drink alcohol or use illicit drugs.  where does patient live--home, Can patient participate in ADLs-yes  No Known Allergies  Family History  Problem Relation Age of Onset  . Hypertension Other     Prior to Admission medications   Medication Sig Start Date End Date Taking? Authorizing Provider  ALPRAZolam Prudy Feeler) 0.5 MG tablet Take 0.5 mg by mouth at bedtime. 04/15/11  Yes Georgina Quint Plotnikov, MD  Ascorbic Acid (VITAMIN C) 100 MG tablet Take 200 mg by mouth 2 (two) times daily.     Yes Historical Provider, MD  Cholecalciferol (EQL VITAMIN D3) 1000 UNITS tablet Take 1,000 Units by mouth daily.     Yes Historical Provider, MD  cyclobenzaprine (FLEXERIL) 10 MG tablet Take 10-20 mg by mouth at bedtime. Take 1-2 qhs   Yes Historical Provider, MD  ferrous sulfate 325 (65 FE) MG tablet Take 325 mg by mouth daily with breakfast.   Yes Historical Provider, MD  folic acid (FOLVITE) 400 MCG tablet Take 1 tablet (400 mcg total) by mouth daily. 09/02/11  Yes Georgina Quint Plotnikov, MD  naproxen (NAPROSYN) 500 MG tablet Take 500 mg by mouth 2 (two) times daily with a meal.     Yes Historical Provider, MD  Olmesartan-Amlodipine-HCTZ 40-10-25 MG TABS Take 1 tablet by mouth daily.   Yes Historical Provider, MD  phentermine (ADIPEX-P) 37.5 MG tablet Take 1 tablet (37.5 mg total) by mouth daily before breakfast. 09/20/12  Yes Tresa Garter, MD  Prenatal MV-Min-Fe Fum-FA-DHA (PRENATAL 1 PO) Take 1 capsule by mouth daily.    Yes Historical  Provider, MD  vitamin B-12 (CYANOCOBALAMIN) 1000 MCG tablet Take 1 tablet (1,000 mcg total) by mouth every other day. 07/08/12  Yes Tresa Garter, MD   Physical Exam: Filed Vitals:   10/21/12 1742 10/21/12 2022  BP: 114/58 106/64  Pulse: 93   Temp: 101.6 F (38.7 C)   TempSrc: Oral   Resp: 20 18  SpO2: 95%     Constitutional: Vital signs reviewed.  Patient is a well-developed and well-nourished  in no acute distress and cooperative with exam.  Alert and oriented x3.  Head: Normocephalic and atraumatic Ear: TM normal bilaterally Mouth: no erythema or exudates, MMM Eyes: PERRL, EOMI, conjunctivae normal, No scleral icterus.  Neck: Supple, Trachea midline normal ROM, No JVD, mass, thyromegaly, or carotid bruit present.  Cardiovascular: RRR, S1 normal, S2 normal, no MRG, pulses symmetric and intact bilaterally Pulmonary/Chest: CTAB, no wheezes, rales, or rhonchi Abdominal: Soft. Non-tender, right mid abdominal tenderness present, no rebound non-distended, bowel sounds are normal, no masses, organomegaly, or guarding present.  GU: no CVA tenderness Extremities: No cyanosis and no edema  Neurological: A&O x3, Strength is normal and symmetric bilaterally, cranial nerve II-XII are grossly intact, no focal motor deficit, sensory intact to light touch bilaterally.  Skin: Warm, dry and intact. No rash.  Psychiatric: Normal mood and affect. speech and behavior is normal. Judgment and thought content normal. Cognition and memory are normal.     Labs on Admission:  Basic Metabolic Panel:  Recent Labs Lab 10/21/12 1806  NA 137  K 3.0*  CL 99  GLUCOSE 104*  BUN 6  CREATININE 1.00   Liver Function Tests:  Recent Labs Lab 10/21/12 2000  AST 22  ALT 14  ALKPHOS 51  BILITOT 0.2*  PROT 7.4  ALBUMIN 2.9*    Recent Labs Lab 10/21/12 2000  LIPASE 44   No results found for this basename: AMMONIA,  in the last 168 hours CBC:  Recent Labs Lab 10/21/12 1756 10/21/12 1806  WBC 5.4  --   NEUTROABS 4.0  --   HGB 11.3* 12.2  HCT 34.4* 36.0  MCV 80.6  --   PLT 147*  --    Cardiac Enzymes: No results found for this basename: CKTOTAL, CKMB, CKMBINDEX, TROPONINI,  in the last 168 hours  BNP (last 3 results) No results found for this basename: PROBNP,  in the last 8760 hours CBG: No results found for this basename: GLUCAP,  in the last 168 hours  Radiological Exams on Admission: Ct Abdomen Pelvis W Contrast  10/21/2012   *RADIOLOGY REPORT*  Clinical Data: Abdominal pain and rule out appendicitis.  CT ABDOMEN AND PELVIS WITH CONTRAST  Technique:  Multidetector CT imaging of the abdomen and pelvis was performed following the standard protocol during bolus administration of intravenous contrast.  Contrast: OMNIPAQUE IOHEXOL 300 MG/ML  SOLN, 50mL OMNIPAQUE IOHEXOL 300 MG/ML  SOLN  Comparison: None.  Findings: The lung bases are clear bilaterally.  No evidence for free intraperitoneal air.  Normal appearance of the liver, portal venous system and gallbladder.  Normal appearance of the spleen, pancreas and left adrenal gland. There are two nodules involving the right adrenal gland and the largest measures 2.7 cm.  These nodules are nonspecific on this postcontrast examination.  Small low density area in the left kidney is suggestive for a cyst.  Otherwise, normal appearance of both kidneys.  There is a large amount of edema around the hepatic flexure of the colon.  There is concern for diffuse  wall thickening of the right colon.  There are prominent lymph nodes in the right ileocolic mesentery.  The appendix has a normal appearance.  No gross abnormality to the terminal ileum.  There appears to be wall thickening of the right and mid transverse colon.  The uterus is nodular and heterogeneous.  Calcification within the uterus.  Findings are suggestive for fibroids.  There is a slightly dense structure near the right ovary that roughly measures 3.4 cm. This could represent a complex or hemorrhagic cyst. There may be a small amount of fluid in the right lower quadrant near small bowel loops.  The patient has an umbilical hernia containing loops of small bowel.  No evidence to suggest a small bowel obstruction.  No acute bony abnormality.  IMPRESSION: There is extensive edema and inflammation around the right colon. Findings are suggestive for colitis.  There are also prominent lymph nodes in the right ileocecal mesentery.  Recommend  follow-up CT or colonoscopy to ensure resolution.  The right ovary and adnexal structures are poorly characterized but cannot exclude a 3.4 cm complex cyst or hemorrhagic right cyst. Consider a follow-up pelvic ultrasound in 6-8 weeks.  Nodular appearance of the uterus is suggestive for fibroids.  Umbilical hernia containing small bowel.  No evidence for a small bowel obstruction.  Right adrenal nodules, the largest measures 2.7 cm.  These nodules are indeterminate on this postcontrast study.  This could be further characterized with a noncontrast abdominal CT or MRI.   Original Report Authenticated By: Richarda Overlie, M.D.    Assessment/Plan Principal Problem:   Colitis, acute -As discussed above, will obtain stool studies -Started on empiric antibiotics with Cipro and Flagyl -Pain management/supportive care -Follow up CT or colonoscopy recommended to ensure resolution Active Problems:   HYPERTENSION -Continue outpatient medications   Hypokalemia -Secondary to GI losses, replace potassium   Right ovarian cyst -Follow up outpatient with GYN for followup ultrasound in 6-8 weeks is recommended   Adrenal nodules, largest 2.7 cm-indeterminate Follow up eval with noncontrast abdominal CT or MRI recommended     Code Status: full Family Communication: mother at bedsisde Disposition Plan: admit to med floor  Time spent: >25mins  Kela Millin [facility] Pager [phone]  If 7PM-7AM, please contact night-coverage [email or web address] Password [email or web address] 10/21/2012, 11:01 PM

## 2012-10-21 NOTE — ED Provider Notes (Signed)
History     CSN: [id number]  Arrival date & time 10/21/12  1632   First MD Initiated Contact with Patient 10/21/12 1941      Chief Complaint  Patient presents with  . Abdominal Pain    RLQ pain r/t appendicitis     Patient is a 42 y.o. female presenting with abdominal pain.  Abdominal Pain Pain location:  Epigastric Pain quality: sharp   Pain radiates to:  RLQ Pain severity:  Moderate Duration:  3 days Progression:  Waxing and waning Associated symptoms: anorexia, diarrhea, fever and nausea   Associated symptoms: no dysuria and no vomiting   Pt has noticed foul smelling flatus.  The pain at times is intense and then will decrease but not resolve completely. Pt saw her PCP today and was sent to the ED to rule out appendicitis.  During the exam when he pressed on the lower abdomen it increased the pain.  Past Medical History  Diagnosis Date  . Anemia     iron deficiency  . Leiomyoma of uterus, unspecified   . Unspecified vitamin D deficiency   . Nausea alone   . Iron deficiency anemia, unspecified   . Other B-complex deficiencies   . Anemia, unspecified   . Leiomyoma of uterus, unspecified   . Allergy     rhinitis  . Hypertension   . Obesity   . Asthma     asthma  . Meralgia paresthetica     Past Surgical History  Procedure Laterality Date  . Myomectomy    . Fibriodidectomy  2009    Family History  Problem Relation Age of Onset  . Hypertension Other     History  Substance Use Topics  . Smoking status: Never Smoker   . Smokeless tobacco: Not on file  . Alcohol Use: No    OB History   Grav Para Term Preterm Abortions TAB SAB Ect Mult Living                  Review of Systems  Constitutional: Positive for fever.  Gastrointestinal: Positive for nausea, abdominal pain, diarrhea and anorexia. Negative for vomiting.  Genitourinary: Negative for dysuria.  All other systems reviewed and are negative.    Allergies  Review of patient's allergies  indicates no known allergies.  Home Medications   Current Outpatient Rx  Name  Route  Sig  Dispense  Refill  . ALPRAZolam (XANAX) 0.5 MG tablet   Oral   Take 0.5 mg by mouth at bedtime.         . Ascorbic Acid (VITAMIN C) 100 MG tablet   Oral   Take 200 mg by mouth 2 (two) times daily.           . Cholecalciferol (EQL VITAMIN D3) 1000 UNITS tablet   Oral   Take 1,000 Units by mouth daily.           . cyclobenzaprine (FLEXERIL) 10 MG tablet   Oral   Take 10-20 mg by mouth at bedtime. Take 1-2 qhs         . ferrous sulfate 325 (65 FE) MG tablet   Oral   Take 325 mg by mouth daily with breakfast.         . folic acid (FOLVITE) 400 MCG tablet   Oral   Take 1 tablet (400 mcg total) by mouth daily.   90 tablet   3   . naproxen (NAPROSYN) 500 MG tablet   Oral   Take  500 mg by mouth 2 (two) times daily with a meal.           . Prenatal MV-Min-Fe Fum-FA-DHA (PRENATAL 1 PO)   Oral   Take 1 capsule by mouth daily.          . vitamin B-12 (CYANOCOBALAMIN) 1000 MCG tablet   Oral   Take 1 tablet (1,000 mcg total) by mouth every other day.   100 tablet   3   . ciprofloxacin (CIPRO) 500 MG tablet   Oral   Take 1 tablet (500 mg total) by mouth 2 (two) times daily.   20 tablet   0   . metroNIDAZOLE (FLAGYL) 500 MG tablet   Oral   Take 1 tablet (500 mg total) by mouth 3 (three) times daily.   30 tablet   0   . ondansetron (ZOFRAN ODT) 4 MG disintegrating tablet   Oral   Take 1 tablet (4 mg total) by mouth every 8 (eight) hours as needed for nausea.   20 tablet   0   . oxyCODONE (OXY IR/ROXICODONE) 5 MG immediate release tablet   Oral   Take 1 tablet (5 mg total) by mouth every 6 (six) hours as needed.   15 tablet   0     BP 98/63  Pulse 74  Temp(Src) 97.2 F (36.2 C) (Oral)  Resp 16  Ht 5\' 3"  (1.6 m)  Wt 212 lb 15.4 oz (96.6 kg)  BMI 37.73 kg/m2  SpO2 100%  LMP 10/12/2012  Physical Exam  Nursing note and vitals reviewed. Constitutional:  She appears well-developed and well-nourished. No distress.  HENT:  Head: Normocephalic and atraumatic.  Right Ear: External ear normal.  Left Ear: External ear normal.  Eyes: Conjunctivae are normal. Right eye exhibits no discharge. Left eye exhibits no discharge. No scleral icterus.  Neck: Neck supple. No tracheal deviation present.  Cardiovascular: Normal rate, regular rhythm and intact distal pulses.   Pulmonary/Chest: Effort normal and breath sounds normal. No stridor. No respiratory distress. She has no wheezes. She has no rales.  Abdominal: Soft. Bowel sounds are normal. She exhibits no distension and no mass. There is tenderness in the right lower quadrant. There is guarding. There is no rigidity and no rebound. No hernia.    Musculoskeletal: She exhibits no edema and no tenderness.  Neurological: She is alert. She has normal strength. No sensory deficit. Cranial nerve deficit:  no gross defecits noted. She exhibits normal muscle tone. She displays no seizure activity. Coordination normal.  Skin: Skin is warm and dry. No rash noted.  Psychiatric: She has a normal mood and affect.    ED Course  Procedures (including critical care time)  Labs Reviewed  CBC WITH DIFFERENTIAL - Abnormal; Notable for the following:    Hemoglobin 11.3 (*)    HCT 34.4 (*)    Platelets 147 (*)    All other components within normal limits  URINALYSIS, ROUTINE W REFLEX MICROSCOPIC - Abnormal; Notable for the following:    APPearance CLOUDY (*)    Hgb urine dipstick LARGE (*)    Protein, ur 30 (*)    All other components within normal limits  URINE MICROSCOPIC-ADD ON - Abnormal; Notable for the following:    Squamous Epithelial / LPF FEW (*)    Bacteria, UA FEW (*)    All other components within normal limits  HEPATIC FUNCTION PANEL - Abnormal; Notable for the following:    Albumin 2.9 (*)    Total Bilirubin  0.2 (*)    Indirect Bilirubin 0.1 (*)    All other components within normal limits    BASIC METABOLIC PANEL - Abnormal; Notable for the following:    Potassium 2.8 (*)    Glucose, Bld 108 (*)    GFR calc non Af Amer 87 (*)    All other components within normal limits  CBC - Abnormal; Notable for the following:    RBC 3.79 (*)    Hemoglobin 10.1 (*)    HCT 30.4 (*)    Platelets 119 (*)    All other components within normal limits  POCT I-STAT, CHEM 8 - Abnormal; Notable for the following:    Potassium 3.0 (*)    Glucose, Bld 104 (*)    Calcium, Ion 1.09 (*)    All other components within normal limits  CLOSTRIDIUM DIFFICILE BY PCR  STOOL CULTURE  LIPASE, BLOOD  MAGNESIUM  BASIC METABOLIC PANEL   Procedure changed from CT Abd Limited W/O Cm       Final result by Rad Results In Interface (10/22/12 15:46:06)    Narrative:   *RADIOLOGY REPORT*  Clinical Data: Adrenal gland abnormality.  CT ABDOMEN WITHOUT CONTRAST  Technique: Multidetector CT imaging of the abdomen was performed following the standard protocol without IV contrast.  Comparison: 10/21/2012  Findings: Dependent subsegmental atelectasis observed in the lower lobes.  Anterior right adrenal nodule 2.3 x 1.3 cm, internal density 27 HU. Posterior adrenal nodule 1.3 x 1.2 cm, internal density 23 HU.  When I calculate the relative washout ratio is of these lesions from yesterday's study, the more anterior lesion has a washout ratio 49% (favoring adenoma) and the posterior lesion has a washout ratio of 30% (indeterminate).  No left renal lesion. Wall thickening and pericolic stranding observed in the right colon and to a lesser extent in the transverse colon. Normal appendix.  IMPRESSION:  1. Both adrenal nodules are indeterminate by today's noncontrast CT scan. Based on the washout ratio as on yesterday's exam, the anterior nodule is most consistent with an adenoma, whereas the posterior nodule remains indeterminate for lipid poor adenoma or another type of mass. Adrenal protocol MRI  with without contrast could be utilized for further workup. Or, these could be observed. 2. Prominent proximal colitis.   Original Report Authenticated By: Gaylyn Rong, M.D.      1. Colitis   2. Hypertension   3. Colitis, acute       MDM  CT scan showed acute colitis.  Pt non toxic but still having pain.  Likely infectious etiology.  Admitted for IV abx, pain management, further treatment.        [name], MD 10/25/12 [phone]

## 2012-10-21 NOTE — Assessment & Plan Note (Addendum)
To [facility] ER now - r/o appendicitis vs other Toradol 60 mg IM was given

## 2012-10-21 NOTE — ED Notes (Signed)
Attempted to call floor for report, receiving Nurse on med pass, will call back in few minutes, message left to Unit Sec.

## 2012-10-21 NOTE — ED Notes (Signed)
Patient transported to CT 

## 2012-10-21 NOTE — ED Notes (Signed)
Pt sent here from PCP to r/o appendicitis , pt c/o RLQ pain with fever and strong odor to gas release.

## 2012-10-22 ENCOUNTER — Ambulatory Visit (HOSPITAL_COMMUNITY): Payer: Managed Care, Other (non HMO)

## 2012-10-22 DIAGNOSIS — R1031 Right lower quadrant pain: Secondary | ICD-10-CM

## 2012-10-22 LAB — CBC
MCV: 80.2 fL (ref 78.0–100.0)
Platelets: 119 10*3/uL — ABNORMAL LOW (ref 150–400)
RBC: 3.79 MIL/uL — ABNORMAL LOW (ref 3.87–5.11)
WBC: 4.5 10*3/uL (ref 4.0–10.5)

## 2012-10-22 LAB — BASIC METABOLIC PANEL
CO2: 30 mEq/L (ref 19–32)
Calcium: 8.6 mg/dL (ref 8.4–10.5)
GFR calc non Af Amer: 87 mL/min — ABNORMAL LOW (ref >90)
Potassium: 2.8 mEq/L — ABNORMAL LOW (ref 3.5–5.1)
Sodium: 135 mEq/L (ref 135–145)

## 2012-10-22 LAB — CLOSTRIDIUM DIFFICILE BY PCR: Toxigenic C. Difficile by PCR: NEGATIVE

## 2012-10-22 MED ORDER — OLMESARTAN-AMLODIPINE-HCTZ 40-10-25 MG PO TABS: 1.0000 | ORAL_TABLET | Freq: Every day | ORAL | Status: DC

## 2012-10-22 MED ORDER — HYDROCHLOROTHIAZIDE 25 MG PO TABS
25.0000 mg | ORAL_TABLET | Freq: Every day | ORAL | Status: DC
  Administered 2012-10-22: 25 mg via ORAL
  Filled 2012-10-22: qty 1

## 2012-10-22 MED ORDER — PANTOPRAZOLE SODIUM 40 MG IV SOLR
40.0000 mg | Freq: Every day | INTRAVENOUS | Status: DC
  Administered 2012-10-22 (×2): 40 mg via INTRAVENOUS
  Filled 2012-10-22 (×3): qty 40

## 2012-10-22 MED ORDER — FERROUS SULFATE 325 (65 FE) MG PO TABS
325.0000 mg | ORAL_TABLET | Freq: Every day | ORAL | Status: DC
  Administered 2012-10-22 – 2012-10-23 (×2): 325 mg via ORAL
  Filled 2012-10-22 (×3): qty 1

## 2012-10-22 MED ORDER — POTASSIUM CHLORIDE 10 MEQ/100ML IV SOLN
10.0000 meq | INTRAVENOUS | Status: DC
  Administered 2012-10-22: 10 meq via INTRAVENOUS
  Filled 2012-10-22 (×4): qty 100

## 2012-10-22 MED ORDER — AMLODIPINE BESYLATE 10 MG PO TABS
10.0000 mg | ORAL_TABLET | Freq: Every day | ORAL | Status: DC
  Administered 2012-10-22: 10 mg via ORAL
  Filled 2012-10-22: qty 1

## 2012-10-22 MED ORDER — ACETAMINOPHEN 325 MG PO TABS: 650.0000 mg | ORAL_TABLET | Freq: Four times a day (QID) | ORAL | Status: DC | PRN

## 2012-10-22 MED ORDER — VITAMIN B-12 1000 MCG PO TABS
1000.0000 ug | ORAL_TABLET | ORAL | Status: DC
  Administered 2012-10-22: 1000 ug via ORAL
  Filled 2012-10-22: qty 1

## 2012-10-22 MED ORDER — MORPHINE SULFATE 2 MG/ML IJ SOLN
1.0000 mg | INTRAMUSCULAR | Status: DC | PRN
  Administered 2012-10-22 (×2): 1 mg via INTRAVENOUS
  Filled 2012-10-22 (×2): qty 1

## 2012-10-22 MED ORDER — AMLODIPINE BESYLATE 5 MG PO TABS
5.0000 mg | ORAL_TABLET | Freq: Every day | ORAL | Status: DC
  Administered 2012-10-23: 5 mg via ORAL
  Filled 2012-10-22: qty 1

## 2012-10-22 MED ORDER — ENOXAPARIN SODIUM 40 MG/0.4ML ~~LOC~~ SOLN
40.0000 mg | SUBCUTANEOUS | Status: DC
  Administered 2012-10-22: 40 mg via SUBCUTANEOUS
  Filled 2012-10-22 (×2): qty 0.4

## 2012-10-22 MED ORDER — SODIUM CHLORIDE 0.9 % IV SOLN
INTRAVENOUS | Status: DC
  Administered 2012-10-22 – 2012-10-23 (×2): via INTRAVENOUS

## 2012-10-22 MED ORDER — METRONIDAZOLE IN NACL 5-0.79 MG/ML-% IV SOLN
500.0000 mg | Freq: Three times a day (TID) | INTRAVENOUS | Status: DC
  Administered 2012-10-22 – 2012-10-23 (×4): 500 mg via INTRAVENOUS
  Filled 2012-10-22 (×6): qty 100

## 2012-10-22 MED ORDER — ONDANSETRON HCL 4 MG/2ML IJ SOLN: 4.0000 mg | Freq: Four times a day (QID) | INTRAMUSCULAR | Status: DC | PRN

## 2012-10-22 MED ORDER — ONDANSETRON HCL 4 MG PO TABS: 4.0000 mg | ORAL_TABLET | Freq: Four times a day (QID) | ORAL | Status: DC | PRN

## 2012-10-22 MED ORDER — ALPRAZOLAM 0.5 MG PO TABS
0.5000 mg | ORAL_TABLET | Freq: Every day | ORAL | Status: DC
  Administered 2012-10-22 (×2): 0.5 mg via ORAL
  Filled 2012-10-22 (×2): qty 1

## 2012-10-22 MED ORDER — CYCLOBENZAPRINE HCL 10 MG PO TABS
10.0000 mg | ORAL_TABLET | Freq: Every day | ORAL | Status: DC
  Administered 2012-10-22 (×2): 10 mg via ORAL
  Filled 2012-10-22 (×3): qty 2

## 2012-10-22 MED ORDER — ACETAMINOPHEN 650 MG RE SUPP: 650.0000 mg | Freq: Four times a day (QID) | RECTAL | Status: DC | PRN

## 2012-10-22 MED ORDER — VITAMIN D 1000 UNITS PO TABS
1000.0000 [IU] | ORAL_TABLET | Freq: Every day | ORAL | Status: DC
  Administered 2012-10-22 – 2012-10-23 (×2): 1000 [IU] via ORAL
  Filled 2012-10-22 (×2): qty 1

## 2012-10-22 MED ORDER — POTASSIUM CHLORIDE 10 MEQ/100ML IV SOLN: 10.0000 meq | INTRAVENOUS | Status: DC

## 2012-10-22 MED ORDER — FOLIC ACID 1 MG PO TABS
1000.0000 ug | ORAL_TABLET | Freq: Every day | ORAL | Status: DC
  Administered 2012-10-22 – 2012-10-23 (×2): 1 mg via ORAL
  Filled 2012-10-22 (×2): qty 1

## 2012-10-22 MED ORDER — POTASSIUM CHLORIDE CRYS ER 20 MEQ PO TBCR
40.0000 meq | EXTENDED_RELEASE_TABLET | Freq: Two times a day (BID) | ORAL | Status: DC
  Filled 2012-10-22: qty 2

## 2012-10-22 MED ORDER — POTASSIUM CHLORIDE 10 MEQ/100ML IV SOLN
10.0000 meq | INTRAVENOUS | Status: AC
  Administered 2012-10-22 (×3): 10 meq via INTRAVENOUS

## 2012-10-22 MED ORDER — POTASSIUM CHLORIDE CRYS ER 20 MEQ PO TBCR
40.0000 meq | EXTENDED_RELEASE_TABLET | ORAL | Status: DC
  Administered 2012-10-22 (×2): 40 meq via ORAL
  Filled 2012-10-22 (×3): qty 2

## 2012-10-22 MED ORDER — OXYCODONE HCL 5 MG PO TABS
5.0000 mg | ORAL_TABLET | Freq: Four times a day (QID) | ORAL | Status: DC | PRN
  Administered 2012-10-22 (×3): 5 mg via ORAL
  Filled 2012-10-22 (×3): qty 1

## 2012-10-22 MED ORDER — IOHEXOL 300 MG/ML  SOLN
25.0000 mL | INTRAMUSCULAR | Status: AC
  Administered 2012-10-22 (×2): 25 mL via ORAL

## 2012-10-22 MED ORDER — CIPROFLOXACIN IN D5W 400 MG/200ML IV SOLN
400.0000 mg | Freq: Two times a day (BID) | INTRAVENOUS | Status: DC
  Administered 2012-10-22 – 2012-10-23 (×3): 400 mg via INTRAVENOUS
  Filled 2012-10-22 (×4): qty 200

## 2012-10-22 MED ORDER — IRBESARTAN 300 MG PO TABS
300.0000 mg | ORAL_TABLET | Freq: Every day | ORAL | Status: DC
Start: 2012-10-22 — End: 2012-10-22
  Administered 2012-10-22: 300 mg via ORAL
  Filled 2012-10-22: qty 1

## 2012-10-22 NOTE — Progress Notes (Signed)
[facility] PROGRESS NOTE  [name] WUJ:[id number] DOB: [birth year] DOA: 10/21/2012 PCP: Sonda Primes, MD  Assessment/Plan: Colitis, acute  -As discussed above, will obtain stool studies  -Will continue Cipro and Flagyl  -Pain management/supportive care  -Follow up CT or colonoscopy recommended to ensure resolution   Active Problems:   HYPERTENSION  -Given soft blood pressures will decrease amlodipine dose - hold ARB and HCTZ  Hypokalemia  -Secondary to GI losses - Patient has received several runs of potassium IV as well as oral replacement with kdur 40 meq. - Magnesium within normal limits at 1.9 - Recheck level next am.  Right ovarian cyst  -Follow up outpatient with GYN for followup ultrasound in 6-8 weeks is recommended by radiologist   Adrenal nodules, largest 2.7 cm-indeterminate  - Order placed for non contrast CT to better characterized as recommended by radiologist.  Code Status: full Family Communication: no family at bedside  Disposition Plan: pending improvement in condition and further work up   Consultants:  none  Procedures:  Ct of abdomen  Antibiotics:  Cipro and Metro  HPI/Subjective: No new complaints.  Patient is inquiring about her adrenal gland nodule found in CT scan.  No acute issues overnight and patient reports improvement in pain. Also denies any diarrhea  Objective: Filed Vitals:   10/21/12 2022 10/22/12 0105 10/22/12 0638 10/22/12 1006  BP: 106/64 96/56 93/56  103/60  Pulse:  88 85   Temp:  98.2 F (36.8 C) 98.1 F (36.7 C)   TempSrc:  Oral Oral   Resp: 18 20 20    Height:  5\' 3"  (1.6 m)    Weight:  96.6 kg (212 lb 15.4 oz)    SpO2:  100% 98%     Intake/Output Summary (Last 24 hours) at 10/22/12 1227 Last data filed at 10/22/12 0900  Gross per 24 hour  Intake 1191.67 ml  Output      0 ml  Net 1191.67 ml   Filed Weights   10/22/12 0105  Weight: 96.6 kg (212 lb 15.4 oz)    Exam:   General:  Pt in  NAD, Alert and Awake  Cardiovascular: RRR, No MRG  Respiratory: CTA BL, no wheezes  Abdomen: RLQ tenderness on palpation, no rebound tenderness, no guarding  Musculoskeletal: no cyanosis or clubbing.   Data Reviewed: Basic Metabolic Panel:  Recent Labs Lab 10/21/12 1806 10/22/12 0403  NA 137 135  K 3.0* 2.8*  CL 99 98  CO2  --  30  GLUCOSE 104* 108*  BUN 6 9  CREATININE 1.00 0.82  CALCIUM  --  8.6  MG  --  1.9   Liver Function Tests:  Recent Labs Lab 10/21/12 2000  AST 22  ALT 14  ALKPHOS 51  BILITOT 0.2*  PROT 7.4  ALBUMIN 2.9*    Recent Labs Lab 10/21/12 2000  LIPASE 44   No results found for this basename: AMMONIA,  in the last 168 hours CBC:  Recent Labs Lab 10/21/12 1756 10/21/12 1806 10/22/12 0403  WBC 5.4  --  4.5  NEUTROABS 4.0  --   --   HGB 11.3* 12.2 10.1*  HCT 34.4* 36.0 30.4*  MCV 80.6  --  80.2  PLT 147*  --  119*   Cardiac Enzymes: No results found for this basename: CKTOTAL, CKMB, CKMBINDEX, TROPONINI,  in the last 168 hours BNP (last 3 results) No results found for this basename: PROBNP,  in the last 8760 hours CBG: No results found for this basename:  GLUCAP,  in the last 168 hours  No results found for this or any previous visit (from the past 240 hour(s)).   Studies: Ct Abdomen Pelvis W Contrast  10/21/2012  *RADIOLOGY REPORT*  Clinical Data: Abdominal pain and rule out appendicitis.  CT ABDOMEN AND PELVIS WITH CONTRAST  Technique:  Multidetector CT imaging of the abdomen and pelvis was performed following the standard protocol during bolus administration of intravenous contrast.  Contrast: OMNIPAQUE IOHEXOL 300 MG/ML  SOLN, 50mL OMNIPAQUE IOHEXOL 300 MG/ML  SOLN  Comparison: None.  Findings: The lung bases are clear bilaterally.  No evidence for free intraperitoneal air.  Normal appearance of the liver, portal venous system and gallbladder.  Normal appearance of the spleen, pancreas and left adrenal gland. There are two  nodules involving the right adrenal gland and the largest measures 2.7 cm.  These nodules are nonspecific on this postcontrast examination.  Small low density area in the left kidney is suggestive for a cyst.  Otherwise, normal appearance of both kidneys.  There is a large amount of edema around the hepatic flexure of the colon.  There is concern for diffuse wall thickening of the right colon.  There are prominent lymph nodes in the right ileocolic mesentery.  The appendix has a normal appearance.  No gross abnormality to the terminal ileum.  There appears to be wall thickening of the right and mid transverse colon.  The uterus is nodular and heterogeneous.  Calcification within the uterus.  Findings are suggestive for fibroids.  There is a slightly dense structure near the right ovary that roughly measures 3.4 cm. This could represent a complex or hemorrhagic cyst. There may be a small amount of fluid in the right lower quadrant near small bowel loops.  The patient has an umbilical hernia containing loops of small bowel.  No evidence to suggest a small bowel obstruction.  No acute bony abnormality.  IMPRESSION: There is extensive edema and inflammation around the right colon. Findings are suggestive for colitis.  There are also prominent lymph nodes in the right ileocecal mesentery.  Recommend follow-up CT or colonoscopy to ensure resolution.  The right ovary and adnexal structures are poorly characterized but cannot exclude a 3.4 cm complex cyst or hemorrhagic right cyst. Consider a follow-up pelvic ultrasound in 6-8 weeks.  Nodular appearance of the uterus is suggestive for fibroids.  Umbilical hernia containing small bowel.  No evidence for a small bowel obstruction.  Right adrenal nodules, the largest measures 2.7 cm.  These nodules are indeterminate on this postcontrast study.  This could be further characterized with a noncontrast abdominal CT or MRI.   Original Report Authenticated By: Richarda Overlie, M.D.      Scheduled Meds: . ALPRAZolam  0.5 mg Oral QHS  . amLODipine  10 mg Oral Daily  . cholecalciferol  1,000 Units Oral Daily  . ciprofloxacin  400 mg Intravenous BID  . cyclobenzaprine  10-20 mg Oral QHS  . enoxaparin (LOVENOX) injection  40 mg Subcutaneous Q24H  . ferrous sulfate  325 mg Oral Q breakfast  . folic acid  1,000 mcg Oral Daily  . hydrochlorothiazide  25 mg Oral Daily  . irbesartan  300 mg Oral Daily  . metronidazole  500 mg Intravenous Q8H  . pantoprazole (PROTONIX) IV  40 mg Intravenous QHS  . potassium chloride  10 mEq Intravenous Q2H  . potassium chloride  40 mEq Oral BID  . vitamin B-12  1,000 mcg Oral QODAY   Continuous Infusions: .  sodium chloride 100 mL/hr at 10/22/12 0135    Principal Problem:   Colitis, acute Active Problems:   HYPERTENSION   Hypokalemia   Right ovarian cyst   Adrenal nodule    Time spent: > 35 minutes    Penny Pia  [facility] Pager [phone]. If 7PM-7AM, please contact night-coverage at [email or web address], password [facility] 10/22/2012, 12:27 PM  LOS: 1 day

## 2012-10-22 NOTE — Progress Notes (Signed)
CRITICAL VALUE ALERT  Critical value received:  K - 2.8  Date of notification: 10/22/2012  Time of notification:  0525 (not notified... Seen on review of AM labs)  Critical value read back: N/A  Nurse who received alert:    MD notified (1st page):  L. [place], PA  Time of first page:  0526  MD notified (2nd page):  Time of second page:  Responding MD:  Wyn Forster, PA  Time MD responded:  0529  4 runs of IV KCl ordered.

## 2012-10-22 NOTE — Progress Notes (Signed)
   CARE MANAGEMENT NOTE 10/22/2012  Patient:  [name], [name]   Account Number:  [id number]  Date Initiated:  10/22/2012  Documentation initiated by:  Jiles Crocker  Subjective/Objective Assessment:   ADMITTED WITH COLITIS     Action/Plan:   PCP: Sonda Primes, MD  LIVES AT HOME WITH FAMILY MEMBERS; CM WILL CONTINUE TO FOLLOW FOR DCP   Anticipated DC Date:  10/27/2012   Anticipated DC Plan:  HOME/SELF CARE     Status of service:  In process, will continue to follow Medicare Important Message given?  NA - LOS <3 / Initial given by admissions (If response is "NO", the following Medicare IM given date fields will be blank)  Per UR Regulation:  Reviewed for med. necessity/level of care/duration of stay Comments:  10/22/2012- B America Sandall RN,BSN,MHA

## 2012-10-23 LAB — BASIC METABOLIC PANEL
BUN: 9 mg/dL (ref 6–23)
Calcium: 8.6 mg/dL (ref 8.4–10.5)
GFR calc non Af Amer: >90 mL/min (ref >90)
Glucose, Bld: 97 mg/dL (ref 70–99)

## 2012-10-23 MED ORDER — CIPROFLOXACIN HCL 500 MG PO TABS: 500.0000 mg | ORAL_TABLET | Freq: Two times a day (BID) | ORAL | Status: DC

## 2012-10-23 MED ORDER — ONDANSETRON 4 MG PO TBDP: 4.0000 mg | ORAL_TABLET | Freq: Three times a day (TID) | ORAL | Status: DC | PRN

## 2012-10-23 MED ORDER — METRONIDAZOLE 500 MG PO TABS: 500.0000 mg | ORAL_TABLET | Freq: Three times a day (TID) | ORAL | Status: DC

## 2012-10-23 MED ORDER — OXYCODONE HCL 5 MG PO TABS: 5.0000 mg | ORAL_TABLET | Freq: Four times a day (QID) | ORAL | Status: DC | PRN

## 2012-10-23 NOTE — Discharge Summary (Signed)
Physician Discharge Summary  [name] NWG:[id number] DOB: 1970/11/27 DOA: 10/21/2012  PCP: Sonda Primes, MD  Admit date: 10/21/2012 Discharge date: 10/23/2012  Time spent: > 40 minutes  Recommendations for Outpatient Follow-up:  1. Please make sure to follow up on Adrenal protocol MRI results. I have provided patient script to be able to obtain imaging study. 2. F/u on clinical progress regarding colitis of which patient will obtain antiemetics and antibiotics 3. Also patient need GYN follow up for ultrasound evaluation of ovarian cyst in 6-8 weeks.  Discharge Diagnoses:  Principal Problem:   Colitis, acute Active Problems:   HYPERTENSION   Hypokalemia   Right ovarian cyst   Adrenal nodule   Discharge Condition: stable  Diet recommendation: low sodium/bland diet  Filed Weights   10/22/12 0105  Weight: 96.6 kg (212 lb 15.4 oz)    History of present illness:  42 y/o presenting with abdominal discomfort to ED.  Found to have colitis on CT scan.    - Please refer to EMR and original H and P for more details on 10/21/12  Hospital Course:  Colitis, acute  -Order for stool studies placed but patient without BM while in house. -Will continue Cipro and Flagyl for 10 more days as out pateint. -Pain management/supportive care  -Consider follow up CT or colonoscopy recommended to ensure resolution  Active Problems:  HYPERTENSION  -Given soft blood pressures most likely due to poor oral solute intake will hold blood pressure medications on discharge.  Hypokalemia  -Secondary to GI losses  - Patient has received several runs of potassium IV as well as oral replacement with kdur 40 meq.  - Magnesium within normal limits at 1.9  - Resolved after replacement with last potassium 3.6  Right ovarian cyst  -Follow up outpatient with GYN for followup ultrasound in 6-8 weeks is recommended by radiologist   Adrenal nodules, largest 2.7 cm-indeterminate  - Patient had two CT  imaging studies.  Please refer to study impressions below.  - Patient will require adrenal protocol MRI per radiologist recommendations for further evaluation. I have discussed with patient and she verbalizes understanding and agreement.  Script provided so patient can get MRI studies as outpatient.    Procedures:  CT scans  Consultations:  none  Discharge Exam: Filed Vitals:   10/22/12 1006 10/22/12 1355 10/22/12 2146 10/23/12 0633  BP: 103/60 97/60 94/65  98/63  Pulse:  86 86 74  Temp:  97.8 F (36.6 C) 97.8 F (36.6 C) 97.2 F (36.2 C)  TempSrc:  Oral Oral Oral  Resp:  18 18 16   Height:      Weight:      SpO2:  100% 100% 100%    General: Pt in NAD, Alert and Awake Cardiovascular: RRR, No MRG Respiratory: CTA BL, no wheezes Abdomen: ND, + bowel sounds, soft, no guarding or rebound tenderness.  Discharge Instructions  Discharge Orders   Future Appointments Provider Department Dept Phone   11/10/2012 8:00 AM Tresa Garter, MD [facility] [phone]   11/18/2012 7:45 AM Tresa Garter, MD [facility] [phone]   Future Orders Complete By Expires     Call MD for:  difficulty breathing, headache or visual disturbances  As directed     Call MD for:  persistant nausea and vomiting  As directed     Call MD for:  severe uncontrolled pain  As directed     Call MD for:  temperature >100.4  As directed  Diet - low sodium heart healthy  As directed     Discharge instructions  As directed     Comments:      Please be sure to follow up with your PCP in 1-2 weeks or sooner should any new concerns arise.  Also I recommend that you take antibiotics as indicated.    Increase activity slowly  As directed         Medication List    STOP taking these medications       Olmesartan-Amlodipine-HCTZ 40-10-25 MG Tabs     phentermine 37.5 MG tablet  Commonly known as:  ADIPEX-P      TAKE these medications        ALPRAZolam 0.5 MG tablet  Commonly known as:  XANAX  Take 0.5 mg by mouth at bedtime.     ciprofloxacin 500 MG tablet  Commonly known as:  CIPRO  Take 1 tablet (500 mg total) by mouth 2 (two) times daily.     cyclobenzaprine 10 MG tablet  Commonly known as:  FLEXERIL  Take 10-20 mg by mouth at bedtime. Take 1-2 qhs     EQL VITAMIN D3 1000 UNITS tablet  Generic drug:  Cholecalciferol  Take 1,000 Units by mouth daily.     ferrous sulfate 325 (65 FE) MG tablet  Take 325 mg by mouth daily with breakfast.     folic acid 400 MCG tablet  Commonly known as:  FOLVITE  Take 1 tablet (400 mcg total) by mouth daily.     metroNIDAZOLE 500 MG tablet  Commonly known as:  FLAGYL  Take 1 tablet (500 mg total) by mouth 3 (three) times daily.     naproxen 500 MG tablet  Commonly known as:  NAPROSYN  Take 500 mg by mouth 2 (two) times daily with a meal.     ondansetron 4 MG disintegrating tablet  Commonly known as:  ZOFRAN ODT  Take 1 tablet (4 mg total) by mouth every 8 (eight) hours as needed for nausea.     oxyCODONE 5 MG immediate release tablet  Commonly known as:  Oxy IR/ROXICODONE  Take 1 tablet (5 mg total) by mouth every 6 (six) hours as needed.     PRENATAL 1 PO  Take 1 capsule by mouth daily.     vitamin B-12 1000 MCG tablet  Commonly known as:  CYANOCOBALAMIN  Take 1 tablet (1,000 mcg total) by mouth every other day.     vitamin C 100 MG tablet  Take 200 mg by mouth 2 (two) times daily.          The results of significant diagnostics from this hospitalization (including imaging, microbiology, ancillary and laboratory) are listed below for reference.    Significant Diagnostic Studies: Ct Abdomen Wo Contrast  Oct 30, 2012  *RADIOLOGY REPORT*  Clinical Data: Adrenal gland abnormality.  CT ABDOMEN WITHOUT CONTRAST  Technique:  Multidetector CT imaging of the abdomen was performed following the standard protocol without IV contrast.  Comparison: 10/21/2012  Findings:  Dependent subsegmental atelectasis observed in the lower lobes.  Anterior right adrenal nodule 2.3 x 1.3 cm, internal density 27 HU. Posterior adrenal nodule 1.3 x 1.2 cm, internal density 23 HU.  When I calculate the relative washout ratio is of these lesions from yesterday's study, the more anterior lesion has a washout ratio 49% (favoring adenoma) and the posterior lesion has a washout ratio of 30% (indeterminate).  No left renal lesion.  Wall thickening and pericolic stranding observed in the right  colon and to a lesser extent in the transverse colon.  Normal appendix.  IMPRESSION:  1.  Both adrenal nodules are indeterminate by today's noncontrast CT scan.  Based on the washout ratio as on yesterday's exam, the anterior nodule is most consistent with an adenoma, whereas the posterior nodule remains indeterminate for lipid poor adenoma or another type of mass.  Adrenal protocol MRI with without contrast could be utilized for further workup.  Or, these could be observed. 2.  Prominent proximal colitis.   Original Report Authenticated By: Gaylyn Rong, M.D.    Ct Abdomen Pelvis W Contrast  10/21/2012  *RADIOLOGY REPORT*  Clinical Data: Abdominal pain and rule out appendicitis.  CT ABDOMEN AND PELVIS WITH CONTRAST  Technique:  Multidetector CT imaging of the abdomen and pelvis was performed following the standard protocol during bolus administration of intravenous contrast.  Contrast: OMNIPAQUE IOHEXOL 300 MG/ML  SOLN, 50mL OMNIPAQUE IOHEXOL 300 MG/ML  SOLN  Comparison: None.  Findings: The lung bases are clear bilaterally.  No evidence for free intraperitoneal air.  Normal appearance of the liver, portal venous system and gallbladder.  Normal appearance of the spleen, pancreas and left adrenal gland. There are two nodules involving the right adrenal gland and the largest measures 2.7 cm.  These nodules are nonspecific on this postcontrast examination.  Small low density area in the left kidney is  suggestive for a cyst.  Otherwise, normal appearance of both kidneys.  There is a large amount of edema around the hepatic flexure of the colon.  There is concern for diffuse wall thickening of the right colon.  There are prominent lymph nodes in the right ileocolic mesentery.  The appendix has a normal appearance.  No gross abnormality to the terminal ileum.  There appears to be wall thickening of the right and mid transverse colon.  The uterus is nodular and heterogeneous.  Calcification within the uterus.  Findings are suggestive for fibroids.  There is a slightly dense structure near the right ovary that roughly measures 3.4 cm. This could represent a complex or hemorrhagic cyst. There may be a small amount of fluid in the right lower quadrant near small bowel loops.  The patient has an umbilical hernia containing loops of small bowel.  No evidence to suggest a small bowel obstruction.  No acute bony abnormality.  IMPRESSION: There is extensive edema and inflammation around the right colon. Findings are suggestive for colitis.  There are also prominent lymph nodes in the right ileocecal mesentery.  Recommend follow-up CT or colonoscopy to ensure resolution.  The right ovary and adnexal structures are poorly characterized but cannot exclude a 3.4 cm complex cyst or hemorrhagic right cyst. Consider a follow-up pelvic ultrasound in 6-8 weeks.  Nodular appearance of the uterus is suggestive for fibroids.  Umbilical hernia containing small bowel.  No evidence for a small bowel obstruction.  Right adrenal nodules, the largest measures 2.7 cm.  These nodules are indeterminate on this postcontrast study.  This could be further characterized with a noncontrast abdominal CT or MRI.   Original Report Authenticated By: Richarda Overlie, M.D.     Microbiology: Recent Results (from the past 240 hour(s))  CLOSTRIDIUM DIFFICILE BY PCR     Status: None   Collection Time    10/22/12  1:30 PM      Result Value Range Status   C  difficile by pcr NEGATIVE  NEGATIVE Final     Labs: Basic Metabolic Panel:  Recent Labs Lab 10/21/12  1806 10/22/12 0403 10/23/12 0452  NA 137 135 137  K 3.0* 2.8* 3.6  CL 99 98 103  CO2  --  30 26  GLUCOSE 104* 108* 97  BUN 6 9 9   CREATININE 1.00 0.82 0.56  CALCIUM  --  8.6 8.6  MG  --  1.9  --    Liver Function Tests:  Recent Labs Lab 10/21/12 2000  AST 22  ALT 14  ALKPHOS 51  BILITOT 0.2*  PROT 7.4  ALBUMIN 2.9*    Recent Labs Lab 10/21/12 2000  LIPASE 44   No results found for this basename: AMMONIA,  in the last 168 hours CBC:  Recent Labs Lab 10/21/12 1756 10/21/12 1806 10/22/12 0403  WBC 5.4  --  4.5  NEUTROABS 4.0  --   --   HGB 11.3* 12.2 10.1*  HCT 34.4* 36.0 30.4*  MCV 80.6  --  80.2  PLT 147*  --  119*   Cardiac Enzymes: No results found for this basename: CKTOTAL, CKMB, CKMBINDEX, TROPONINI,  in the last 168 hours BNP: BNP (last 3 results) No results found for this basename: PROBNP,  in the last 8760 hours CBG: No results found for this basename: GLUCAP,  in the last 168 hours     Signed:  Penny Pia  [facility] 10/23/2012, 11:51 AM

## 2012-10-23 NOTE — Progress Notes (Signed)
DC to home. To car by wc. No change from AM assessment.  Oletta Cohn [place] RN 10/23/2012

## 2012-10-26 LAB — STOOL CULTURE

## 2012-11-03 ENCOUNTER — Ambulatory Visit (INDEPENDENT_AMBULATORY_CARE_PROVIDER_SITE_OTHER): Payer: Managed Care, Other (non HMO) | Admitting: Internal Medicine

## 2012-11-03 ENCOUNTER — Other Ambulatory Visit (INDEPENDENT_AMBULATORY_CARE_PROVIDER_SITE_OTHER): Payer: Managed Care, Other (non HMO)

## 2012-11-03 ENCOUNTER — Encounter: Payer: Self-pay | Admitting: Internal Medicine

## 2012-11-03 VITALS — BP 110/80 | HR 80 | Temp 98.0°F | Resp 16 | Wt 207.0 lb

## 2012-11-03 DIAGNOSIS — N83209 Unspecified ovarian cyst, unspecified side: Secondary | ICD-10-CM

## 2012-11-03 DIAGNOSIS — I10 Essential (primary) hypertension: Secondary | ICD-10-CM

## 2012-11-03 DIAGNOSIS — E278 Other specified disorders of adrenal gland: Secondary | ICD-10-CM

## 2012-11-03 DIAGNOSIS — E538 Deficiency of other specified B group vitamins: Secondary | ICD-10-CM

## 2012-11-03 DIAGNOSIS — E876 Hypokalemia: Secondary | ICD-10-CM

## 2012-11-03 DIAGNOSIS — K529 Noninfective gastroenteritis and colitis, unspecified: Secondary | ICD-10-CM | POA: Insufficient documentation

## 2012-11-03 DIAGNOSIS — N83201 Unspecified ovarian cyst, right side: Secondary | ICD-10-CM

## 2012-11-03 DIAGNOSIS — K5289 Other specified noninfective gastroenteritis and colitis: Secondary | ICD-10-CM

## 2012-11-03 DIAGNOSIS — R1031 Right lower quadrant pain: Secondary | ICD-10-CM

## 2012-11-03 LAB — BASIC METABOLIC PANEL
CO2: 29 mEq/L (ref 19–32)
GFR: 135.28 mL/min (ref >60.00)
Glucose, Bld: 121 mg/dL — ABNORMAL HIGH (ref 70–99)
Potassium: 3.6 mEq/L (ref 3.5–5.1)
Sodium: 136 mEq/L (ref 135–145)

## 2012-11-03 LAB — CBC WITH DIFFERENTIAL/PLATELET
Basophils Relative: 0.4 % (ref 0.0–3.0)
Eosinophils Absolute: 0 10*3/uL (ref 0.0–0.7)
Eosinophils Relative: 0.8 % (ref 0.0–5.0)
Hemoglobin: 12.3 g/dL (ref 12.0–15.0)
Lymphocytes Relative: 30.9 % (ref 12.0–46.0)
MCHC: 33.4 g/dL (ref 30.0–36.0)
Neutro Abs: 2.9 10*3/uL (ref 1.4–7.7)
RBC: 4.57 Mil/uL (ref 3.87–5.11)
WBC: 4.7 10*3/uL (ref 4.5–10.5)

## 2012-11-03 MED ORDER — DIPHENOXYLATE-ATROPINE 2.5-0.025 MG PO TABS: 1.0000 | ORAL_TABLET | Freq: Four times a day (QID) | ORAL | Status: DC | PRN

## 2012-11-03 NOTE — Assessment & Plan Note (Signed)
Continue with current prescription therapy as reflected on the Med list.  

## 2012-11-03 NOTE — Progress Notes (Signed)
Subjective:    HPI  She recalls - her mother was sick too at the same time - they both ate at [employer] on Sunday; she sent pasta back - it tasted bad. They both had gas and diarrhea... Post-hosp f/u:   42 y/o presenting with abdominal discomfort to ED. Found to have colitis on CT scan.  - Please refer to EMR and original H and P for more details on 10/21/12  Hospital Course:  Colitis, acute  -Order for stool studies placed but patient without BM while in house.  -Will continue Cipro and Flagyl for 10 more days as out pateint.  -Pain management/supportive care  -Consider follow up CT or colonoscopy recommended to ensure resolution  Active Problems:  HYPERTENSION  -Given soft blood pressures most likely due to poor oral solute intake will hold blood pressure medications on discharge.  Hypokalemia  -Secondary to GI losses  - Patient has received several runs of potassium IV as well as oral replacement with kdur 40 meq.  - Magnesium within normal limits at 1.9  - Resolved after replacement with last potassium 3.6  Right ovarian cyst  -Follow up outpatient with GYN for followup ultrasound in 6-8 weeks is recommended by radiologist  Adrenal nodules, largest 2.7 cm-indeterminate  - Patient had two CT imaging studies. Please refer to study impressions below.  - Patient will require adrenal protocol MRI per radiologist recommendations for further evaluation. I have discussed with patient and she verbalizes understanding and agreement. Script provided so patient can get MRI studies as outpatient.  Procedures:  CT scans Consultations:  none  Doing a little better, tired, no appetite C/o loose stools  Wt Readings from Last 3 Encounters:  11/03/12 207 lb (93.895 kg)  10/22/12 212 lb 15.4 oz (96.6 kg)  10/21/12 213 lb (96.616 kg)   BP Readings from Last 3 Encounters:  11/03/12 110/80  10/23/12 98/63  10/21/12 102/60     Review of Systems  Constitutional: Positive for fatigue.  Negative for chills, activity change, appetite change and unexpected weight change.  HENT: Negative for mouth sores and sinus pressure.   Eyes: Negative for visual disturbance.  Respiratory: Negative for chest tightness.   Genitourinary: Negative for difficulty urinating and vaginal pain.  Musculoskeletal: Negative for back pain and gait problem.  Skin: Negative for pallor.  Neurological: Negative for dizziness, tremors, weakness and numbness.  Psychiatric/Behavioral: Negative for confusion and sleep disturbance.         Objective:   Physical Exam  Constitutional: She appears well-developed. No distress.  Obese Looks tired   HENT:  Head: Normocephalic.  Right Ear: External ear normal.  Left Ear: External ear normal.  Nose: Nose normal.  Mouth/Throat: Oropharynx is clear and moist.  Coated tongue  Eyes: Conjunctivae are normal. Pupils are equal, round, and reactive to light. Right eye exhibits no discharge. Left eye exhibits no discharge.  Neck: Normal range of motion. Neck supple. No JVD present. No tracheal deviation present. Thyromegaly present.  Cardiovascular: Normal rate, regular rhythm and normal heart sounds.   tachy  Pulmonary/Chest: No stridor. No respiratory distress. She has no wheezes.  Abdominal: Soft. Bowel sounds are normal. She exhibits no distension and no mass. There is tenderness (RLQ). There is rebound and guarding.  Musculoskeletal: She exhibits no edema and no tenderness.  Lymphadenopathy:    She has no cervical adenopathy.  Neurological: She displays normal reflexes. No cranial nerve deficit. She exhibits normal muscle tone. Coordination normal.  Skin: No rash  noted. No erythema.  Psychiatric: She has a normal mood and affect. Her behavior is normal. Judgment and thought content normal.   Lab Results  Component Value Date   WBC 4.5 10/22/2012   HGB 10.1* 10/22/2012   HCT 30.4* 10/22/2012   PLT 119* 10/22/2012   GLUCOSE 97 10/23/2012   CHOL 180  07/06/2012   TRIG 76.0 07/06/2012   HDL 72.50 07/06/2012   LDLCALC 92 07/06/2012   ALT 14 10/21/2012   AST 22 10/21/2012   NA 137 10/23/2012   K 3.6 10/23/2012   CL 103 10/23/2012   CREATININE 0.56 10/23/2012   BUN 9 10/23/2012   CO2 26 10/23/2012   TSH 0.67 09/20/2012   INR 0.9 RATIO 03/15/2007   HGBA1C 5.8 09/20/2012    Hosp notes/tests reviewed A complex case      Assessment & Plan:

## 2012-11-03 NOTE — Assessment & Plan Note (Addendum)
3/14 s/p hosp stay and Rx w/Cipro/Flagyl. She recalls - her mother was sick too at the same time - they both ate at [employer] on Sunday; she sent pasta back - it tasted bad. They both had gas and diarrhea... CT 10/22/11 -- IMPRESSION:  There is extensive edema and inflammation around the right colon.  Findings are suggestive for colitis. There are also prominent  lymph nodes in the right ileocecal mesentery. Recommend follow-up  CT or colonoscopy to ensure resolution.  The right ovary and adnexal structures are poorly characterized but  cannot exclude a 3.4 cm complex cyst or hemorrhagic right cyst.  Consider a follow-up pelvic ultrasound in 6-8 weeks.  Nodular appearance of the uterus is suggestive for fibroids.  Umbilical hernia containing small bowel. No evidence for a small  bowel obstruction.  Right adrenal nodules, the largest measures 2.7 cm. These nodules  are indeterminate on this postcontrast study. This could be  further characterized with a noncontrast abdominal CT or MRI.   GI consult

## 2012-11-03 NOTE — Assessment & Plan Note (Signed)
Treated

## 2012-11-03 NOTE — Assessment & Plan Note (Signed)
GYN appt/US pending

## 2012-11-03 NOTE — Assessment & Plan Note (Signed)
3/14 s/p hosp stay and Rx w/Cipro/Flagyl CT 10/22/11 -- IMPRESSION:  There is extensive edema and inflammation around the right colon.  Findings are suggestive for colitis. There are also prominent  lymph nodes in the right ileocecal mesentery. Recommend follow-up  CT or colonoscopy to ensure resolution.  The right ovary and adnexal structures are poorly characterized but  cannot exclude a 3.4 cm complex cyst or hemorrhagic right cyst.  Consider a follow-up pelvic ultrasound in 6-8 weeks.  Nodular appearance of the uterus is suggestive for fibroids.  Umbilical hernia containing small bowel. No evidence for a small  bowel obstruction.  Right adrenal nodules, the largest measures 2.7 cm. These nodules  are indeterminate on this postcontrast study. This could be  further characterized with a noncontrast abdominal CT or MRI.   May need an MRI

## 2012-11-03 NOTE — Assessment & Plan Note (Signed)
Colitis related A little better

## 2012-11-05 ENCOUNTER — Ambulatory Visit (INDEPENDENT_AMBULATORY_CARE_PROVIDER_SITE_OTHER): Payer: Managed Care, Other (non HMO) | Admitting: Internal Medicine

## 2012-11-05 ENCOUNTER — Encounter: Payer: Self-pay | Admitting: Internal Medicine

## 2012-11-05 ENCOUNTER — Encounter: Payer: Self-pay | Admitting: *Deleted

## 2012-11-05 VITALS — BP 106/70 | HR 100 | Ht 63.0 in | Wt 205.8 lb

## 2012-11-05 DIAGNOSIS — K5289 Other specified noninfective gastroenteritis and colitis: Secondary | ICD-10-CM

## 2012-11-05 DIAGNOSIS — R933 Abnormal findings on diagnostic imaging of other parts of digestive tract: Secondary | ICD-10-CM

## 2012-11-05 MED ORDER — OMEPRAZOLE MAGNESIUM 20 MG PO TBEC: 20.0000 mg | DELAYED_RELEASE_TABLET | Freq: Every day | ORAL | Status: DC

## 2012-11-05 NOTE — Patient Instructions (Addendum)
You have been scheduled for a colonoscopy with propofol. Please follow written instructions given to you at your visit today.  Please pick up your prep kit at the pharmacy within the next 1-3 days. If you use inhalers (even only as needed), please bring them with you on the day of your procedure.  We have given you samples of the following medication to take: Prilosec 20 mg OTC-Take 1 tablet daily x 10 days.  Low-Fiber/Residue Diet Fiber is found in fruits, vegetables, and grains. A low-fiber diet restricts fibrous foods that are not digested in the small intestine. A diet containing about 10 grams of fiber is considered low fiber.  PURPOSE  To prevent blockage of a partially obstructed or narrowed gastrointestinal tract.  To reduce fecal weight and volume.  To slow the movement of feces. WHEN IS THIS DIET USED?  It may be used during the acute phase of Crohn disease, ulcerative colitis, regional enteritis, or diverticulitis.  It may be used if your intestinal or esophageal tubes are narrowing (stenosis).  It may be used as a transitional diet following surgery, injury (trauma), or illness. CHOOSING FOODS Check labels, especially on foods from the starch list. Often times, dietary fiber content is listed on the nutrition facts panel. Please ask your Registered Dietitian if you have questions about specific foods that are related to your condition, especially if the food is not listed on this handout. Breads and Starches  Allowed: White, [place], and pita breads, plain rolls, buns, or sweet rolls, doughnuts, waffles, pancakes, bagels. Plain muffins, biscuits, matzoth. Soda, saltine, graham crackers. Pretzels, rusks, melba toast, zwieback. Cooked cereals: cornmeal, farina, or cream cereals. Dry cereals: refined corn, wheat, rice, and oat cereals (check label). Potatoes prepared any way without skins, refined macaroni, spaghetti, noodles, refined rice.  Avoid: Whole-wheat bread, rolls, and  crackers. Multigrains, rye, bran seeds, nuts, or coconut. Cereals containing whole grains, multigrains, bran, coconut, nuts, raisins. Cooked or dry oatmeal. Coarse wheat cereals, granola. Cereals advertised as "high fiber." Potato skins. Whole-grain pasta, wild or brown rice. Popcorn. Vegetables  Allowed: Strained tomato and vegetable juices. Fresh lettuce, cucumber, spinach. Well-cooked or canned: asparagus, bean sprouts, broccoli, cut green beans, cauliflower, pumpkin, beets, mushrooms, olives, yellow squash, tomato, tomato sauce, zucchini, turnips.Keep servings limited to  cup.  Avoid: Fresh, cooked, or canned: artichokes, baked beans, beet greens, Brussels sprouts, corn, kale, legumes, peas, sweet potatoes. Avoid large servings of any vegetables. Fruit  Allowed: All fruit juices except prune juice. Cooked or canned fruits without skin and seeds: apricots, applesauce, cantaloupe, cherries, grapefruit, grapes, kiwi, mandarin oranges, peaches, pears, fruit cocktail, pineapple, plums, watermelon. Fresh without skin: banana, grapes, cantaloupe, avocado, cherries, pineapple, kiwi, nectarines, peaches, blueberries. Keep servings limited to  cup or 1 piece.  Avoid: Fresh: apples with or without skin, apricots, mangoes, pears, raspberries, strawberries. Prune juice and juices with pulp, stewed or dried prunes. Dried fruits, raisins, dates. Avoid large servings of all fresh fruits. Meat and Protein Substitutes  Allowed: Ground or well-cooked tender beef, ham, veal, lamb, pork, poultry. Eggs, plain cheese. Fish, oysters, shrimp, lobster, other seafood. Liver, organ meats. Smooth nut butters.  Avoid: Tough, fibrous meats with gristle. Chunky nut butter.Cheese with seeds, nuts, or other foods not allowed. Nuts, seeds, legumes, dried peas, beans, lentils. Dairy  Allowed: All milk products except those not allowed.  Avoid: Yogurt or cheese that contains nuts, seeds, or added fruit. Soups and  Combination Foods  Allowed: Bouillon, broth, or cream soups made from allowed foods.  Any strained soup. Casseroles or mixed dishes made with allowed foods.  Avoid: Soups made from vegetables that are not allowed or that contain other foods not allowed. Desserts and Sweets  Allowed:Plain cakes and cookies, pie made with allowed fruit, pudding, custard, cream pie. Gelatin, fruit, ice, sherbet, frozen ice pops. Ice cream, ice milk without nuts. Plain hard candy, honey, jelly, molasses, syrup, sugar, chocolate syrup, gumdrops, marshmallows.  Avoid: Desserts, cookies, or candies that contain nuts, peanut butter, dried fruits. Jams, preserves with seeds, marmalade. Fats and Oils  Allowed:Margarine, butter, cream, mayonnaise, salad oils, plain salad dressings made from allowed foods.  Avoid: Seeds, nuts, olives. Beverages  Allowed: All, except those listed to avoid.  Avoid: Fruit juices with high pulp, prune juice. Condiments  Allowed:Ketchup, mustard, horseradish, vinegar, cream sauce, cheese sauce, cocoa powder. Spices in moderation: allspice, basil, bay leaves, celery powder or leaves, cinnamon, cumin powder, curry powder, ginger, mace, marjoram, onion or garlic powder, oregano, paprika, parsley flakes, ground pepper, rosemary, sage, savory, tarragon, thyme, turmeric.  Avoid: Coconut, pickles. SAMPLE MENU Breakfast   cup orange juice.  1 boiled egg.  1 slice white toast.  Margarine.   cup cornflakes.  1 cup milk.  Beverage. Lunch   cup chicken noodle soup.  2 to 3 oz sliced roast beef.  2 slices white bread.  Mayonnaise.   cup tomato juice.  1 small banana.  Beverage. Dinner  3 oz baked chicken.   cup scalloped potatoes.   cup cooked beets.  White dinner roll.  Margarine.   cup canned peaches.  Beverage. Document Released: 01/10/2002 Document Revised: 10/13/2011 Document Reviewed: 08/07/2011 ExitCare Patient Information 2013 [place],  [place]. ____________________________________________________________________________________  CC: Dr Sonda Primes

## 2012-11-05 NOTE — Progress Notes (Signed)
[name] [birth year] MRN [id number]  History of Present Illness:  This is a 42 year old African American female who is post recent hospitalization for acute diarrheal illness which is associated with abdominal pain in the right lower quadrant and an abnormal CT scan consistent with acute colitis involving the right colon and transverse colon. Patient was admitted from March 20-October 23, 2012 and was treated with intravenous Flagyl and Cipro. She had marked improvement and her stool studies were negative. She has been slowly recovering but is still having diarrhea. The fever has subsided. Initially, her white blood cell count was 3000 but has come up to normal. Her liver function test are normal. Her platelet count dropped to 130,000 but is back to normal. She has known fibroids in her uterus anda structure  in the right ovary which raises a question of hemorrhagic cyst. She was taking phentermine at the time of the illness which raises a question of ischemic colitis. Her mother had similar symptoms but not as severe at the same time. She has lost about 7 pounds since the onset of the illness. She is still only on full liquids.   Past Medical History  Diagnosis Date  . Anemia     iron deficiency  . Leiomyoma of uterus, unspecified   . Unspecified vitamin D deficiency   . Nausea alone   . Iron deficiency anemia, unspecified   . Other B-complex deficiencies   . Anemia, unspecified   . Leiomyoma of uterus, unspecified   . Allergy     rhinitis  . Hypertension   . Obesity   . Asthma     asthma  . Meralgia paresthetica   . Umbilical hernia   . Colitis    Past Surgical History  Procedure Laterality Date  . Myomectomy    . Fibriodidectomy  2009    reports that she has never smoked. She has never used smokeless tobacco. She reports that she does not drink alcohol or use illicit drugs. family history includes Hypertension in her other. No Known Allergies      Review of Systems:  Positive for burning in her throat and bad taste in her mouth full fever. Negative for rectal bleeding  The remainder of the 10 point ROS is negative except as outlined in H&P   Physical Exam: General appearance  Well developed, in no distress. Eyes- non icteric. HEENT nontraumatic, normocephalic. Mouth no lesions, tongue papillated, no cheilosis. Neck supple without adenopathy, thyroid not enlarged, no carotid bruits, no JVD. Lungs Clear to auscultation bilaterally. Cor normal S1, normal S2, regular rhythm, no murmur,  quiet precordium. Abdomen: Soft and nontender. Quiet bowel sounds. No distention. No tympany. Right lower quadrant exam is unremarkable. Rectal: Deferred. Extremities no pedal edema. Skin no lesions. Neurological alert and oriented x 3. Psychological normal mood and affect.  Assessment and Plan:  Problem #5 42 year old female who developed acute colitis, likely infectious. She is slowly recovering following completion of her antibiotic therapy with Cipro and Flagyl. She is still having diarrhea but overall is feeling better. Another possibility is that she had ischemic colitis since she was on phentermine at the time. Stool studies were negative. I doubt that we are dealing with inflammatory bowel disease but that needs to be ruled out as well. Since she is doing better, we will continue to follow her and will schedule her for a colonoscopy 4 weeks from now to assess healing of her colon as well as to rule out chronic colitis. I have given  her samples of a probiotic as well as of Prilosec 20 mg daily. We have instructed her in a low residue diet. She is to continue on Lomotil and hold off on phentermine.   11/05/2012 [name]

## 2012-11-08 ENCOUNTER — Telehealth: Payer: Self-pay | Admitting: Internal Medicine

## 2012-11-08 MED ORDER — PEG-KCL-NACL-NASULF-NA ASC-C 100 G PO SOLR: ORAL | Status: DC

## 2012-11-08 NOTE — Telephone Encounter (Signed)
Drink plenty of fluids, Gatorade, and eat bananas to increase K+ intake.

## 2012-11-08 NOTE — Telephone Encounter (Signed)
Spoke with patient and she states she is having cramping in her thighs. She will call her PCP for this.

## 2012-11-08 NOTE — Telephone Encounter (Signed)
Rx sent to pharmacy. Left a message for patient to call me. 

## 2012-11-09 NOTE — Telephone Encounter (Signed)
Left a message for patient to call me. 

## 2012-11-09 NOTE — Telephone Encounter (Signed)
Patient given Dr. Brodie's recommendation. 

## 2012-11-10 ENCOUNTER — Ambulatory Visit: Payer: Managed Care, Other (non HMO) | Admitting: Internal Medicine

## 2012-11-18 ENCOUNTER — Ambulatory Visit: Payer: Managed Care, Other (non HMO) | Admitting: Internal Medicine

## 2012-11-22 ENCOUNTER — Ambulatory Visit (INDEPENDENT_AMBULATORY_CARE_PROVIDER_SITE_OTHER): Payer: Managed Care, Other (non HMO) | Admitting: Internal Medicine

## 2012-11-22 ENCOUNTER — Encounter: Payer: Self-pay | Admitting: Internal Medicine

## 2012-11-22 VITALS — BP 110/70 | HR 76 | Temp 97.9°F | Resp 16 | Wt 207.0 lb

## 2012-11-22 DIAGNOSIS — L0203 Carbuncle of face: Secondary | ICD-10-CM

## 2012-11-22 DIAGNOSIS — L0202 Furuncle of face: Secondary | ICD-10-CM

## 2012-11-22 MED ORDER — ACYCLOVIR 400 MG PO TABS: 400.0000 mg | ORAL_TABLET | Freq: Three times a day (TID) | ORAL | Status: DC

## 2012-11-22 MED ORDER — DOXYCYCLINE HYCLATE 100 MG PO TABS: 100.0000 mg | ORAL_TABLET | Freq: Two times a day (BID) | ORAL | Status: DC

## 2012-11-22 NOTE — Patient Instructions (Signed)
See Misty Stanley in 2 wks for MMR, varicella and Hep B #3 shots

## 2012-11-22 NOTE — Assessment & Plan Note (Signed)
4/14 R chin Empiric acyclovir and Doxy

## 2012-11-22 NOTE — Progress Notes (Signed)
    Subjective:    HPI  C/o a sore on the R chin since Thursday - painful blister; her mother squeezed   it - worse now, scabed, tender She requested MMR, varicella and Hep B #3 for school  42 y/o presenting with  colitis on CT scan f/u. Doing better, tired, better appetite   Wt Readings from Last 3 Encounters:  11/22/12 207 lb (93.895 kg)  11/05/12 205 lb 12.8 oz (93.35 kg)  11/03/12 207 lb (93.895 kg)   BP Readings from Last 3 Encounters:  11/22/12 110/70  11/05/12 106/70  11/03/12 110/80     Review of Systems  Constitutional: Positive for fatigue. Negative for chills, activity change, appetite change and unexpected weight change.  HENT: Negative for mouth sores and sinus pressure.   Eyes: Negative for visual disturbance.  Respiratory: Negative for chest tightness.   Genitourinary: Negative for difficulty urinating and vaginal pain.  Musculoskeletal: Negative for back pain and gait problem.  Skin: Negative for pallor.  Neurological: Negative for dizziness, tremors, weakness and numbness.  Psychiatric/Behavioral: Negative for confusion and sleep disturbance.         Objective:   Physical Exam  Constitutional: She appears well-developed. No distress.  Obese Looks tired   HENT:  Head: Normocephalic.  Right Ear: External ear normal.  Left Ear: External ear normal.  Nose: Nose normal.  Mouth/Throat: Oropharynx is clear and moist.  Coated tongue  Eyes: Conjunctivae are normal. Pupils are equal, round, and reactive to light. Right eye exhibits no discharge. Left eye exhibits no discharge.  Neck: Normal range of motion. Neck supple. No JVD present. No tracheal deviation present. Thyromegaly present.  Cardiovascular: Normal rate, regular rhythm and normal heart sounds.   tachy  Pulmonary/Chest: No stridor. No respiratory distress. She has no wheezes.  Abdominal: Soft. Bowel sounds are normal. She exhibits no distension and no mass. There is tenderness (RLQ). There  is no rebound and no guarding.  Musculoskeletal: She exhibits no edema and no tenderness.  Lymphadenopathy:    She has no cervical adenopathy.  Neurological: She displays normal reflexes. No cranial nerve deficit. She exhibits normal muscle tone. Coordination normal.  Skin: No rash noted. No erythema.  6 mm scabbed tender lesion w/1 cm area of erythema around  Psychiatric: She has a normal mood and affect. Her behavior is normal. Judgment and thought content normal.   Lab Results  Component Value Date   WBC 4.7 11/03/2012   HGB 12.3 11/03/2012   HCT 36.8 11/03/2012   PLT 229.0 11/03/2012   GLUCOSE 121* 11/03/2012   CHOL 180 07/06/2012   TRIG 76.0 07/06/2012   HDL 72.50 07/06/2012   LDLCALC 92 07/06/2012   ALT 14 10/21/2012   AST 22 10/21/2012   NA 136 11/03/2012   K 3.6 11/03/2012   CL 98 11/03/2012   CREATININE 0.6 11/03/2012   BUN 12 11/03/2012   CO2 29 11/03/2012   TSH 0.67 09/20/2012   INR 0.9 RATIO 03/15/2007   HGBA1C 5.8 09/20/2012         Assessment & Plan:

## 2012-11-25 ENCOUNTER — Other Ambulatory Visit: Payer: Self-pay | Admitting: Internal Medicine

## 2012-11-26 ENCOUNTER — Telehealth: Payer: Self-pay | Admitting: Internal Medicine

## 2012-11-26 DIAGNOSIS — R21 Rash and other nonspecific skin eruption: Secondary | ICD-10-CM

## 2012-11-26 NOTE — Telephone Encounter (Signed)
Will do. Mychart message: Dear [name],  Sorry that you are not better! If the spot on your face is worse and it looks and feels like a boil now, you need to be seen immediately - ER or urgent care. If it can wait - you can be seen tomorrow am at [place] office by one of our doctors. Please continue same meds. Feel better!  AP  P.S.: I'll be out of town all week    Thx

## 2012-11-26 NOTE — Telephone Encounter (Signed)
Face is worse.  It is redder than it was. She wants to be referred to a dermatologist.

## 2012-11-29 NOTE — Telephone Encounter (Signed)
Pt is a little better.  Will call back if needs to come before her dermatology appt.

## 2012-12-08 ENCOUNTER — Encounter: Payer: Self-pay | Admitting: Internal Medicine

## 2012-12-08 ENCOUNTER — Ambulatory Visit (AMBULATORY_SURGERY_CENTER): Payer: Managed Care, Other (non HMO) | Admitting: Internal Medicine

## 2012-12-08 VITALS — BP 130/79 | HR 67 | Temp 98.2°F | Resp 18 | Ht 63.0 in | Wt 205.0 lb

## 2012-12-08 DIAGNOSIS — K529 Noninfective gastroenteritis and colitis, unspecified: Secondary | ICD-10-CM

## 2012-12-08 DIAGNOSIS — R933 Abnormal findings on diagnostic imaging of other parts of digestive tract: Secondary | ICD-10-CM

## 2012-12-08 DIAGNOSIS — K5289 Other specified noninfective gastroenteritis and colitis: Secondary | ICD-10-CM

## 2012-12-08 MED ORDER — SODIUM CHLORIDE 0.9 % IV SOLN: 500.0000 mL | INTRAVENOUS | Status: DC

## 2012-12-08 NOTE — Progress Notes (Signed)
Patient did not experience any of the following events: a burn prior to discharge; a fall within the facility; wrong site/side/patient/procedure/implant event; or a hospital transfer or hospital admission upon discharge from the facility. (G8907) Patient did not have preoperative order for IV antibiotic SSI prophylaxis. (G8918)  

## 2012-12-08 NOTE — Op Note (Signed)
[facility] [address] [place], [address]   COLONOSCOPY PROCEDURE REPORT  PATIENT: Vanessa, Williams  MR#: [id number] BIRTHDATE: 1971/02/03 , 41  yrs. old GENDER: Female ENDOSCOPIST: Hart Carwin, MD REFERRED BY:  Janeal Holmes, M.D. PROCEDURE DATE:  12/08/2012 PROCEDURE:   Colonoscopy, diagnostic ASA CLASS:   Class II INDICATIONS:she was hospitalized in March 2014 with acute colitis , CT scan confirmed wall thickening right colon, slow improvement, finifhed antibiotics, currently doing well. MEDICATIONS: MAC sedation, administered by CRNA and propofol (Diprivan) 300mg  IV  DESCRIPTION OF PROCEDURE:   After the risks and benefits and of the procedure were explained, informed consent was obtained.  A digital rectal exam revealed no abnormalities of the rectum.    The LB [id number]-[id number]  endoscope was introduced through the anus and advanced to the cecum, which was identified by both the appendix and ileocecal valve .  The quality of the prep was good, using MoviPrep .  The instrument was then slowly withdrawn as the colon was fully examined.     COLON FINDINGS: A normal appearing cecum, ileocecal valve, and appendiceal orifice were identified.  The ascending, hepatic flexure, transverse, splenic flexure, descending, sigmoid colon and rectum appeared unremarkable.  No polyps or cancers were seen. Retroflexed views revealed no abnormalities.     The scope was then withdrawn from the patient and the procedure completed.  COMPLICATIONS: There were no complications. ENDOSCOPIC IMPRESSION: Normal colon , complete resolution  of recent colitis, no evidence of Crohn's disease  RECOMMENDATIONS: High fiber diet   REPEAT EXAM: In 10 year(s)  for Colonoscopy.  cc:  _______________________________ eSignedHart Carwin, MD 12/08/2012 2:24 PM

## 2012-12-08 NOTE — Patient Instructions (Addendum)

## 2012-12-09 ENCOUNTER — Telehealth: Payer: Self-pay

## 2012-12-09 NOTE — Telephone Encounter (Signed)
Left message on answering machine. 

## 2012-12-15 ENCOUNTER — Telehealth: Payer: Self-pay | Admitting: Internal Medicine

## 2012-12-15 NOTE — Telephone Encounter (Signed)
Rec'd from [facility] forward 3 pages to Dr.Plotnikov

## 2012-12-16 ENCOUNTER — Ambulatory Visit (INDEPENDENT_AMBULATORY_CARE_PROVIDER_SITE_OTHER): Payer: Managed Care, Other (non HMO) | Admitting: Internal Medicine

## 2012-12-16 ENCOUNTER — Encounter: Payer: Self-pay | Admitting: Internal Medicine

## 2012-12-16 ENCOUNTER — Other Ambulatory Visit (INDEPENDENT_AMBULATORY_CARE_PROVIDER_SITE_OTHER): Payer: Managed Care, Other (non HMO)

## 2012-12-16 VITALS — BP 118/80 | HR 80 | Temp 98.0°F | Resp 16 | Wt 212.0 lb

## 2012-12-16 DIAGNOSIS — D649 Anemia, unspecified: Secondary | ICD-10-CM

## 2012-12-16 DIAGNOSIS — R5381 Other malaise: Secondary | ICD-10-CM

## 2012-12-16 DIAGNOSIS — D509 Iron deficiency anemia, unspecified: Secondary | ICD-10-CM

## 2012-12-16 DIAGNOSIS — R5383 Other fatigue: Secondary | ICD-10-CM

## 2012-12-16 DIAGNOSIS — I10 Essential (primary) hypertension: Secondary | ICD-10-CM

## 2012-12-16 DIAGNOSIS — J309 Allergic rhinitis, unspecified: Secondary | ICD-10-CM

## 2012-12-16 DIAGNOSIS — Z23 Encounter for immunization: Secondary | ICD-10-CM

## 2012-12-16 DIAGNOSIS — E538 Deficiency of other specified B group vitamins: Secondary | ICD-10-CM

## 2012-12-16 DIAGNOSIS — E559 Vitamin D deficiency, unspecified: Secondary | ICD-10-CM

## 2012-12-16 LAB — IBC PANEL
Saturation Ratios: 20.9 % (ref 20.0–50.0)
Transferrin: 279.8 mg/dL (ref 212.0–360.0)

## 2012-12-16 NOTE — Assessment & Plan Note (Addendum)
5/14 Needs vaccines for school: Hep B #3, MMR Varicella titer ordered Form filled out

## 2012-12-16 NOTE — Progress Notes (Signed)
  Subjective:    Patient ID: [name], female    DOB: 18-Jun-1971, 42 y.o.   MRN: [id number]  HPI  She is here to have additional vaccines and to get a varicella titer. Chin abscess has resolved  Review of Systems     Objective:   Physical Exam  Constitutional: She appears well-developed. No distress.  obese  Cardiovascular: Normal rate and normal heart sounds.   No murmur heard. Pulmonary/Chest: She has no wheezes.  Abdominal: There is no tenderness.  Skin: No rash noted.          Assessment & Plan:

## 2012-12-16 NOTE — Assessment & Plan Note (Signed)
Better  

## 2012-12-17 LAB — VARICELLA ZOSTER ANTIBODY, IGG: Varicella IgG: 1794 Index — ABNORMAL HIGH (ref <135.00)

## 2012-12-21 ENCOUNTER — Ambulatory Visit (INDEPENDENT_AMBULATORY_CARE_PROVIDER_SITE_OTHER): Payer: Managed Care, Other (non HMO) | Admitting: *Deleted

## 2012-12-21 DIAGNOSIS — Z111 Encounter for screening for respiratory tuberculosis: Secondary | ICD-10-CM

## 2012-12-22 NOTE — Progress Notes (Signed)
This encounter was created in error - please disregard.

## 2012-12-24 LAB — TB SKIN TEST
Induration: 0 mm
TB Skin Test: NEGATIVE

## 2012-12-28 ENCOUNTER — Ambulatory Visit (INDEPENDENT_AMBULATORY_CARE_PROVIDER_SITE_OTHER): Payer: Managed Care, Other (non HMO) | Admitting: *Deleted

## 2012-12-28 DIAGNOSIS — Z111 Encounter for screening for respiratory tuberculosis: Secondary | ICD-10-CM

## 2013-02-08 ENCOUNTER — Other Ambulatory Visit: Payer: Self-pay | Admitting: Internal Medicine

## 2013-03-15 ENCOUNTER — Other Ambulatory Visit: Payer: Self-pay | Admitting: *Deleted

## 2013-03-15 NOTE — Telephone Encounter (Signed)
Ok Thx 

## 2013-03-15 NOTE — Telephone Encounter (Signed)
Pt called requesting Nuvigil refill.  Rx is not on current medication list.  Please advise.

## 2013-03-16 MED ORDER — ARMODAFINIL 150 MG PO TABS: 150.0000 mg | ORAL_TABLET | Freq: Every day | ORAL | Status: DC

## 2013-03-17 ENCOUNTER — Other Ambulatory Visit: Payer: Self-pay | Admitting: *Deleted

## 2013-03-17 NOTE — Telephone Encounter (Signed)
Called pharmacy to verify was Nuvigil called in on 03/15/13. Per Jazmine med was call in, but pt is needing a PA. She states info was fax to md but haven't heard back on PA. As kJazmine to refax & we will proceed with PA...lmb

## 2013-03-17 NOTE — Telephone Encounter (Signed)
Received fax pt needing PA on her Nuvigil. Notified insurance fax over PA form has been completed and fax back. Waiting on approval status...Raechel Chute

## 2013-03-18 NOTE — Telephone Encounter (Signed)
Received PA back med has been approve starting 03/17/13-03/17/14. Notified [facility] spoke with Heidi gave approval status...Raechel Chute

## 2013-04-25 ENCOUNTER — Other Ambulatory Visit (HOSPITAL_COMMUNITY): Payer: Self-pay | Admitting: Internal Medicine

## 2013-05-01 ENCOUNTER — Other Ambulatory Visit: Payer: Self-pay | Admitting: Internal Medicine

## 2013-06-07 ENCOUNTER — Other Ambulatory Visit: Payer: Self-pay | Admitting: Internal Medicine

## 2013-06-09 ENCOUNTER — Other Ambulatory Visit: Payer: Self-pay

## 2013-08-02 ENCOUNTER — Other Ambulatory Visit: Payer: Self-pay | Admitting: Internal Medicine

## 2013-08-10 ENCOUNTER — Other Ambulatory Visit: Payer: Self-pay | Admitting: Obstetrics and Gynecology

## 2013-08-24 ENCOUNTER — Encounter: Payer: Self-pay | Admitting: Internal Medicine

## 2013-08-24 ENCOUNTER — Ambulatory Visit (INDEPENDENT_AMBULATORY_CARE_PROVIDER_SITE_OTHER): Payer: 59 | Admitting: Internal Medicine

## 2013-08-24 VITALS — BP 120/80 | HR 80 | Temp 98.3°F | Resp 16 | Wt 211.0 lb

## 2013-08-24 DIAGNOSIS — J019 Acute sinusitis, unspecified: Secondary | ICD-10-CM | POA: Insufficient documentation

## 2013-08-24 MED ORDER — AZITHROMYCIN 250 MG PO TABS: ORAL_TABLET | ORAL | Status: DC

## 2013-08-24 NOTE — Progress Notes (Signed)
   Subjective:    Patient ID: [name], female    DOB: Dec 19, 1970, 43 y.o.   MRN: [id number]  Sinusitis This is a new problem. The current episode started in the past 7 days. The problem has been gradually worsening since onset. There has been no fever. The pain is moderate. Associated symptoms include chills, congestion and headaches. Pertinent negatives include no sore throat. Past treatments include acetaminophen and oral decongestants. The treatment provided no relief.      Review of Systems  Constitutional: Positive for chills.  HENT: Positive for congestion. Negative for sore throat.   Neurological: Positive for headaches.       Objective:   Physical Exam  Constitutional: She appears well-developed. No distress.  HENT:  Head: Normocephalic.  Right Ear: External ear normal.  Left Ear: External ear normal.  Mouth/Throat: No oropharyngeal exudate.  eryth nasal mucosa  Eyes: Conjunctivae are normal. Pupils are equal, round, and reactive to light. Right eye exhibits no discharge. Left eye exhibits no discharge.  Neck: Normal range of motion. Neck supple. No JVD present. No tracheal deviation present. No thyromegaly present.  Cardiovascular: Normal rate, regular rhythm and normal heart sounds.   Pulmonary/Chest: No stridor. No respiratory distress. She has no wheezes.  Abdominal: Soft. Bowel sounds are normal. She exhibits no distension and no mass. There is no tenderness. There is no rebound and no guarding.  Musculoskeletal: She exhibits no edema and no tenderness.  Lymphadenopathy:    She has no cervical adenopathy.  Neurological: She displays normal reflexes. No cranial nerve deficit. She exhibits normal muscle tone. Coordination normal.  Skin: No rash noted. No erythema.  Psychiatric: She has a normal mood and affect. Her behavior is normal. Judgment and thought content normal.          Assessment & Plan:

## 2013-08-24 NOTE — Patient Instructions (Signed)
Use over-the-counter  "cold" medicines  such as  "Afrin" nasal spray for nasal congestion as directed instead. Use" Delsym" or" Robitussin" cough syrup varietis for cough.  You can use plain "Tylenol" or "Advil" for fever, chills and achyness.  Please, make an appointment if you are not better or if you're worse.  

## 2013-08-24 NOTE — Progress Notes (Signed)
Pre visit review using our clinic review tool, if applicable. No additional management support is needed unless otherwise documented below in the visit note. 

## 2013-08-24 NOTE — Assessment & Plan Note (Signed)
Zpac 

## 2013-10-19 ENCOUNTER — Ambulatory Visit (INDEPENDENT_AMBULATORY_CARE_PROVIDER_SITE_OTHER): Payer: 59 | Admitting: Internal Medicine

## 2013-10-19 ENCOUNTER — Encounter: Payer: Self-pay | Admitting: Internal Medicine

## 2013-10-19 VITALS — BP 120/88 | HR 84 | Temp 98.1°F | Resp 16 | Wt 214.0 lb

## 2013-10-19 DIAGNOSIS — M545 Low back pain, unspecified: Secondary | ICD-10-CM

## 2013-10-19 DIAGNOSIS — I10 Essential (primary) hypertension: Secondary | ICD-10-CM

## 2013-10-19 DIAGNOSIS — E538 Deficiency of other specified B group vitamins: Secondary | ICD-10-CM

## 2013-10-19 MED ORDER — NAPROXEN 500 MG PO TABS: 500.0000 mg | ORAL_TABLET | Freq: Two times a day (BID) | ORAL | Status: DC

## 2013-10-19 MED ORDER — OXYCODONE HCL 5 MG PO TABS: 2.5000 mg | ORAL_TABLET | Freq: Four times a day (QID) | ORAL | Status: DC | PRN

## 2013-10-19 NOTE — Assessment & Plan Note (Signed)
3/15 L sciatica vs piriformis

## 2013-10-19 NOTE — Progress Notes (Signed)
Pre visit review using our clinic review tool, if applicable. No additional management support is needed unless otherwise documented below in the visit note. 

## 2013-10-19 NOTE — Assessment & Plan Note (Signed)
Continue with current prescription therapy as reflected on the Med list.  

## 2013-10-20 ENCOUNTER — Telehealth: Payer: Self-pay | Admitting: Internal Medicine

## 2013-10-20 NOTE — Telephone Encounter (Signed)
Relevant patient education assigned to patient using Emmi. ° °

## 2013-12-27 ENCOUNTER — Other Ambulatory Visit: Payer: Self-pay | Admitting: Internal Medicine

## 2014-02-11 IMAGING — CT CT ABDOMEN W/O CM
2 of 4 series · 17 of 46 positions shown, 19 images · non-contrast
Comparison: 10/21/2012

CLINICAL DATA: Adrenal gland abnormality.

CT ABDOMEN WITHOUT CONTRAST
TECHNIQUE: Multidetector CT imaging of the abdomen was performed
following the standard protocol without IV contrast.

[Series 2: adrenals · axial · 0.77mm/px · z∈[-300,-39]mm · 14 of 99 slices shown, 16 images]
[im 6/99  soft-tissue]
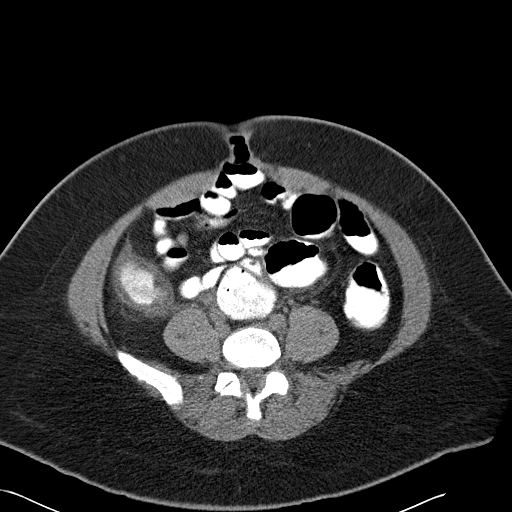
[im 6/99  bone]
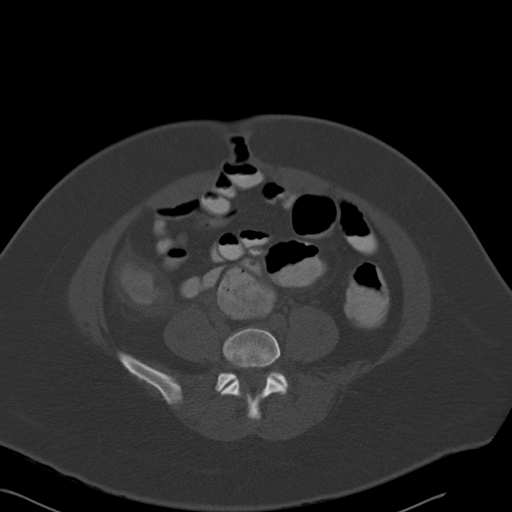
[im 11/99  soft-tissue]
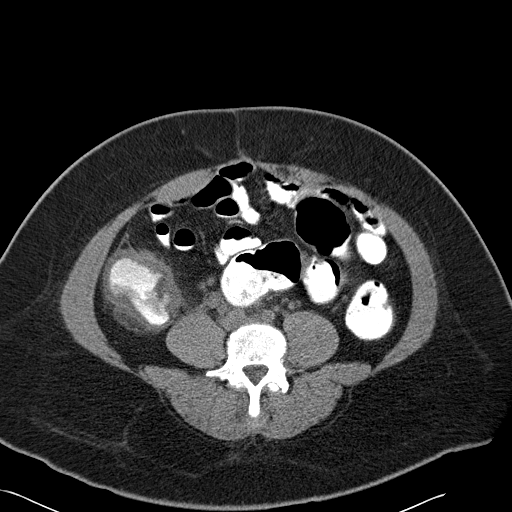
[im 21/99  soft-tissue]
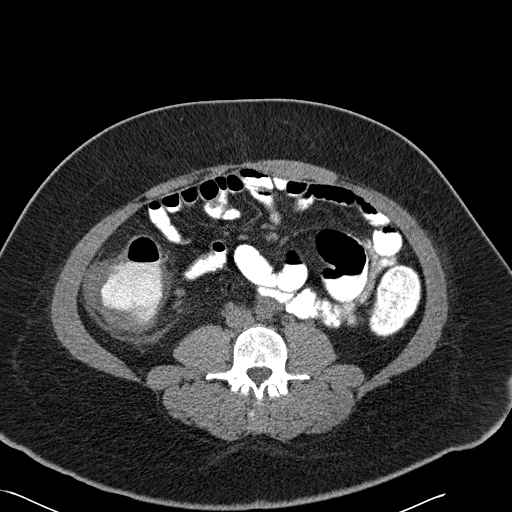
[im 26/99  soft-tissue]
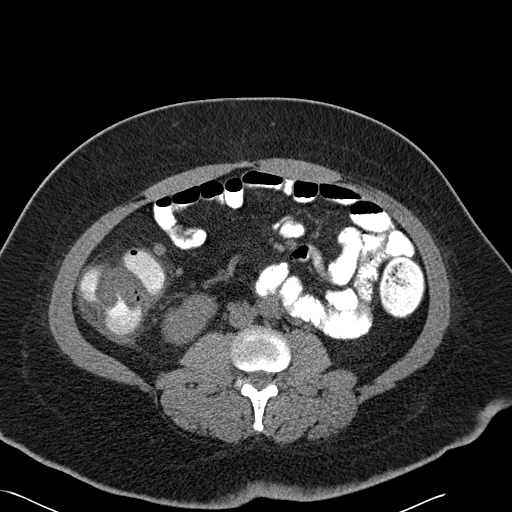
[im 31/99  soft-tissue]
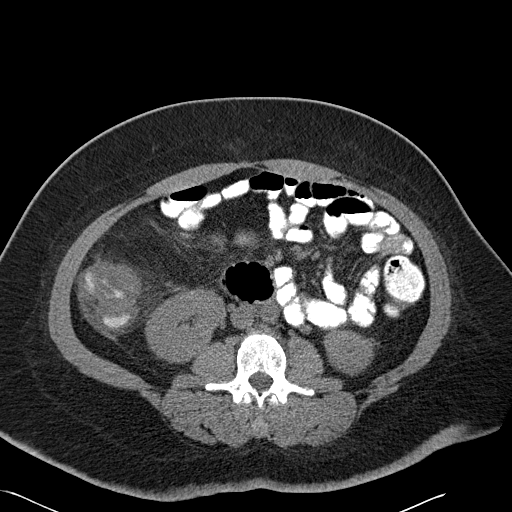
[im 42/99  soft-tissue]
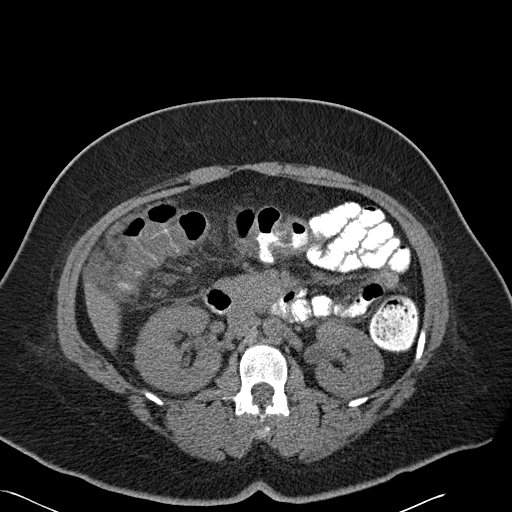
[im 47/99  soft-tissue]
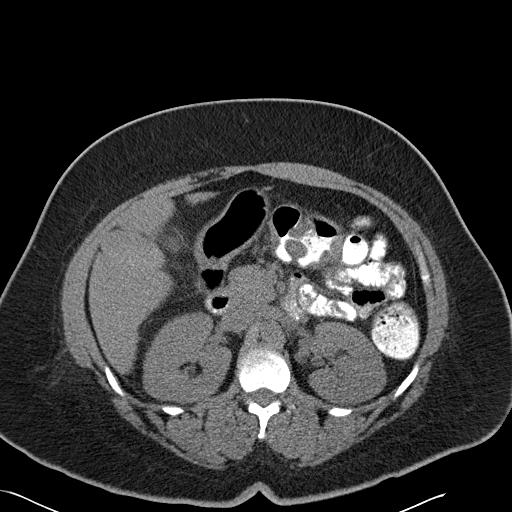
[im 52/99  soft-tissue]
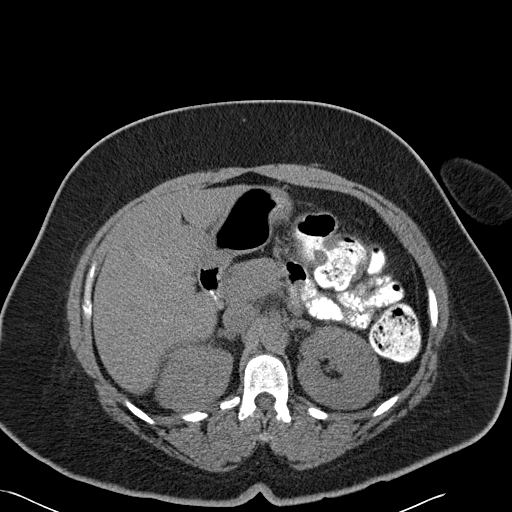
[im 57/99  soft-tissue]
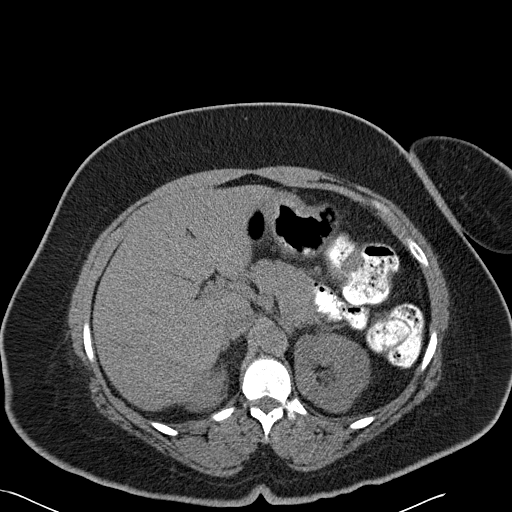
[im 57/99  bone]
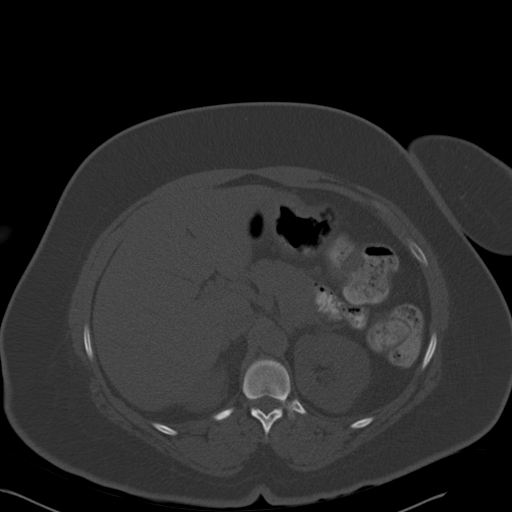
[im 68/99  soft-tissue]
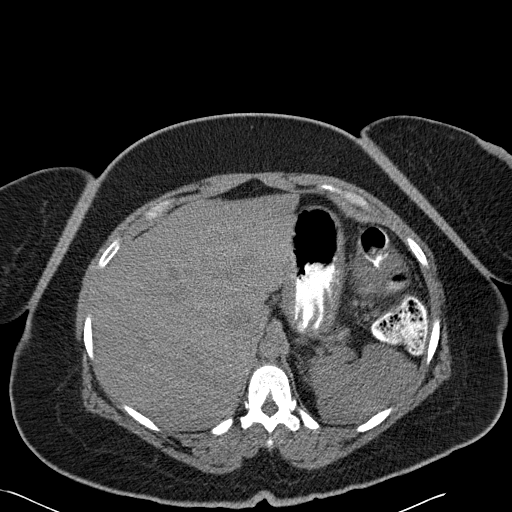
[im 73/99  soft-tissue]
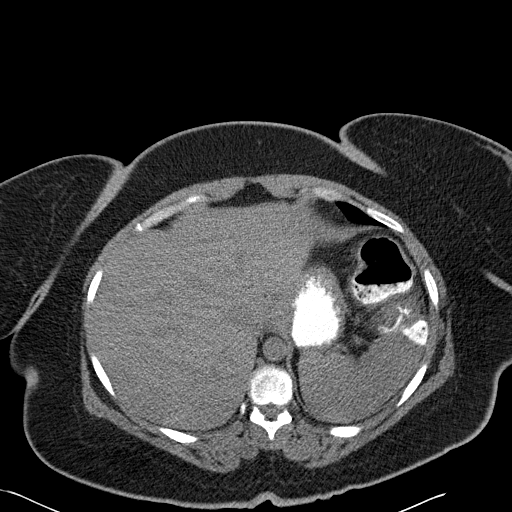
[im 78/99  soft-tissue]
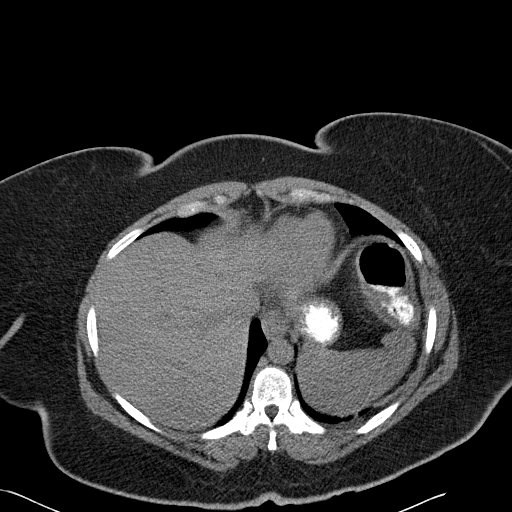
[im 88/99  soft-tissue]
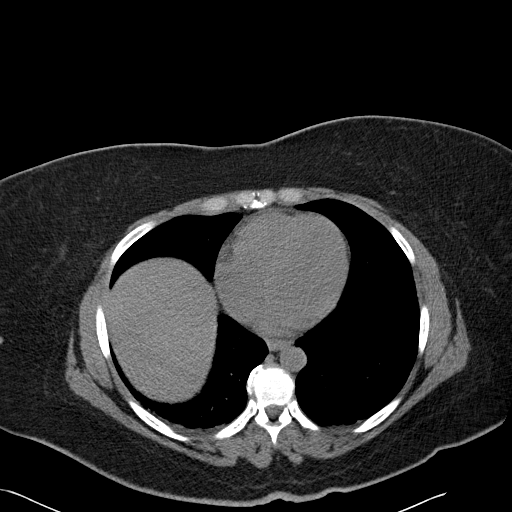
[im 93/99  soft-tissue]
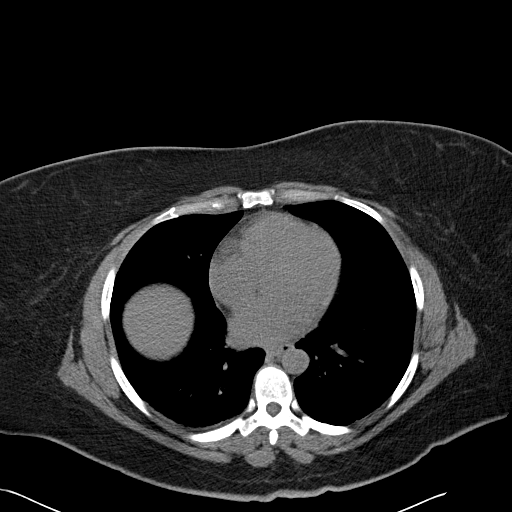

[Series 603: cor · coronal · 0.77mm/px · 3 of 147 slices shown]
[im 49/147  soft-tissue]
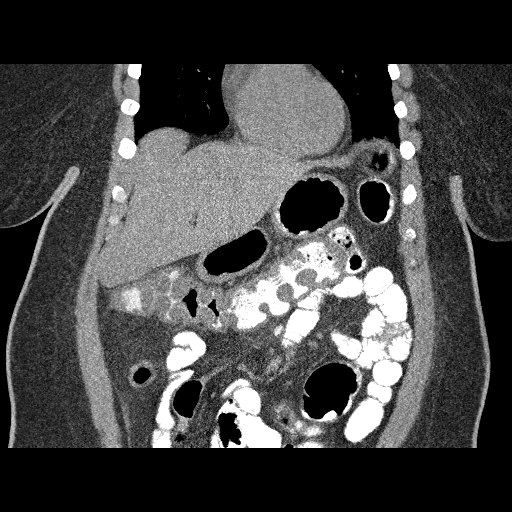
[im 65/147  soft-tissue]
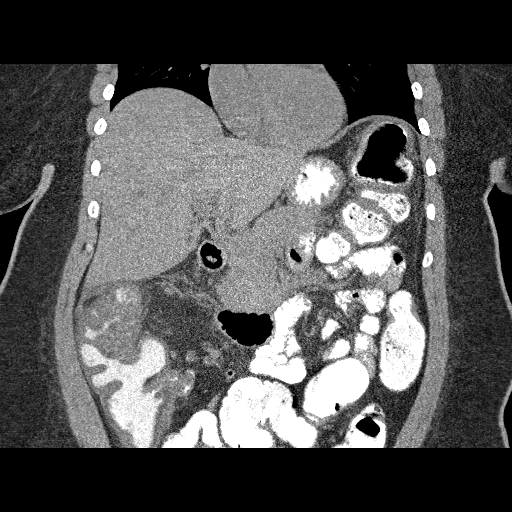
[im 82/147  soft-tissue]
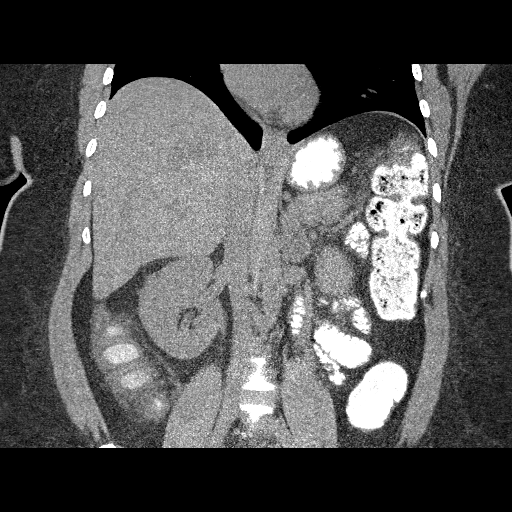

[17 of 46 positions shown; findings below may reference images not displayed]

FINDINGS: Dependent subsegmental atelectasis observed in the lower
lobes.

Anterior right adrenal nodule 2.3 x 1.3 cm, internal density 27 HU.
Posterior adrenal nodule 1.3 x 1.2 cm, internal density 23 HU.

When I calculate the relative washout ratio is of these lesions
from yesterday's study, the more anterior lesion has a washout
ratio 49% (favoring adenoma) and the posterior lesion has a washout
ratio of 30% (indeterminate).

No left renal lesion.  Wall thickening and pericolic stranding
observed in the right colon and to a lesser extent in the
transverse colon.  Normal appendix.
IMPRESSION: 1.  Both adrenal nodules are indeterminate by today's noncontrast
CT scan.  Based on the washout ratio as on yesterday's exam, the
anterior nodule is most consistent with an adenoma, whereas the
posterior nodule remains indeterminate for lipid poor adenoma or
another type of mass.  Adrenal protocol MRI with without contrast
could be utilized for further workup.  Or, these could be observed.
2.  Prominent proximal colitis.

## 2014-02-23 ENCOUNTER — Telehealth: Payer: Self-pay | Admitting: Internal Medicine

## 2014-02-23 NOTE — Telephone Encounter (Signed)
I'm overbooked already Pls see Dr Linna Darner or Dr Jenny Reichmann if available Thx

## 2014-02-23 NOTE — Telephone Encounter (Signed)
Patient is having trouble with her anxiety and is requesting to see Dr. Alain Marion, refuses to see another physician, pt informed he would be on vacation next week and asked for call back from assistant

## 2014-02-23 NOTE — Telephone Encounter (Signed)
Dr. Alain Marion do you want to work pt in tomorrow. pls advise...[name]

## 2014-02-24 NOTE — Telephone Encounter (Signed)
Called pt again she stated that she had made appt to discuss sxs with her gynecologist. She didn't want to see anyone else...[name]

## 2014-02-24 NOTE — Telephone Encounter (Signed)
Called pt no answer LMOM RTC.../lmb 

## 2014-03-17 ENCOUNTER — Encounter: Payer: Self-pay | Admitting: Internal Medicine

## 2014-03-17 ENCOUNTER — Ambulatory Visit (INDEPENDENT_AMBULATORY_CARE_PROVIDER_SITE_OTHER): Payer: 59 | Admitting: Internal Medicine

## 2014-03-17 VITALS — BP 110/68 | HR 68 | Temp 98.4°F | Resp 16 | Wt 201.0 lb

## 2014-03-17 DIAGNOSIS — R5383 Other fatigue: Secondary | ICD-10-CM

## 2014-03-17 DIAGNOSIS — F4321 Adjustment disorder with depressed mood: Secondary | ICD-10-CM | POA: Insufficient documentation

## 2014-03-17 DIAGNOSIS — E538 Deficiency of other specified B group vitamins: Secondary | ICD-10-CM

## 2014-03-17 DIAGNOSIS — R5381 Other malaise: Secondary | ICD-10-CM

## 2014-03-17 MED ORDER — BUPROPION HCL ER (SR) 100 MG PO TB12: 100.0000 mg | ORAL_TABLET | Freq: Two times a day (BID) | ORAL | Status: DC

## 2014-03-17 NOTE — Progress Notes (Signed)
Pre visit review using our clinic review tool, if applicable. No additional management support is needed unless otherwise documented below in the visit note. 

## 2014-03-17 NOTE — Progress Notes (Signed)
    Subjective:    HPI  C/o poor focus at school, anxiety, PMS, stress     Wt Readings from Last 3 Encounters:  03/17/14 201 lb (91.173 kg)  10/19/13 214 lb (97.07 kg)  08/24/13 211 lb (95.709 kg)   BP Readings from Last 3 Encounters:  03/17/14 110/68  10/19/13 120/88  08/24/13 120/80     Review of Systems  Constitutional: Positive for fatigue. Negative for chills, activity change, appetite change and unexpected weight change.  HENT: Negative for mouth sores and sinus pressure.   Eyes: Negative for visual disturbance.  Respiratory: Negative for chest tightness.   Genitourinary: Negative for difficulty urinating and vaginal pain.  Musculoskeletal: Negative for back pain and gait problem.  Skin: Negative for pallor.  Neurological: Negative for dizziness, tremors, weakness and numbness.  Psychiatric/Behavioral: Negative for confusion and sleep disturbance.         Objective:   Physical Exam  Constitutional: She appears well-developed. No distress.  Obese Looks tired   HENT:  Head: Normocephalic.  Right Ear: External ear normal.  Left Ear: External ear normal.  Nose: Nose normal.  Mouth/Throat: Oropharynx is clear and moist.  Coated tongue  Eyes: Conjunctivae are normal. Pupils are equal, round, and reactive to light. Right eye exhibits no discharge. Left eye exhibits no discharge.  Neck: Normal range of motion. Neck supple. No JVD present. No tracheal deviation present. Thyromegaly present.  Cardiovascular: Normal rate, regular rhythm and normal heart sounds.   tachy  Pulmonary/Chest: No stridor. No respiratory distress. She has no wheezes.  Abdominal: Soft. Bowel sounds are normal. She exhibits no distension and no mass. There is tenderness (RLQ). There is no rebound and no guarding.  Musculoskeletal: She exhibits no edema and no tenderness.  Lymphadenopathy:    She has no cervical adenopathy.  Neurological: She displays normal reflexes. No cranial nerve  deficit. She exhibits normal muscle tone. Coordination normal.  Skin: No rash noted. No erythema.  Psychiatric: She has a normal mood and affect. Her behavior is normal. Judgment and thought content normal.   Lab Results  Component Value Date   WBC 4.7 11/03/2012   HGB 12.3 11/03/2012   HCT 36.8 11/03/2012   PLT 229.0 11/03/2012   GLUCOSE 121* 11/03/2012   CHOL 180 07/06/2012   TRIG 76.0 07/06/2012   HDL 72.50 07/06/2012   LDLCALC 92 07/06/2012   ALT 14 10/21/2012   AST 22 10/21/2012   NA 136 11/03/2012   K 3.6 11/03/2012   CL 98 11/03/2012   CREATININE 0.6 11/03/2012   BUN 12 11/03/2012   CO2 29 11/03/2012   TSH 0.67 09/20/2012   INR 0.9 RATIO 03/15/2007   HGBA1C 5.8 09/20/2012         Assessment & Plan:

## 2014-03-17 NOTE — Assessment & Plan Note (Signed)
wellbutrin SR 100 mg bid

## 2014-03-19 ENCOUNTER — Encounter: Payer: Self-pay | Admitting: Internal Medicine

## 2014-03-19 NOTE — Assessment & Plan Note (Signed)
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 Discussed

## 2014-03-19 NOTE — Assessment & Plan Note (Signed)
Continue with current prescription therapy as reflected on the Med list.  

## 2014-03-21 ENCOUNTER — Telehealth: Payer: Self-pay | Admitting: *Deleted

## 2014-03-21 NOTE — Telephone Encounter (Signed)
Left msg on triage twice requesting status on PA for the wellbutrin that was rx last week. Called pt back no answer LMOM will forward msg to stacy to give her a call back concerning PA...Johny Chess

## 2014-03-21 NOTE — Telephone Encounter (Signed)
pls address Thx

## 2014-03-22 ENCOUNTER — Encounter: Payer: Self-pay | Admitting: Internal Medicine

## 2014-03-23 NOTE — Telephone Encounter (Signed)
Pt and pharmacy informed of PA approval.

## 2014-03-23 NOTE — Telephone Encounter (Signed)
Bupropion SR 100 mg PA is approved x 12 months.

## 2014-03-24 NOTE — Telephone Encounter (Signed)
Forwarding msg to Stacy pls call pt and let her know status on her PA...lmb

## 2014-05-22 ENCOUNTER — Other Ambulatory Visit (HOSPITAL_COMMUNITY): Payer: Self-pay | Admitting: Internal Medicine

## 2014-07-11 ENCOUNTER — Other Ambulatory Visit: Payer: Self-pay | Admitting: Internal Medicine

## 2014-08-07 ENCOUNTER — Other Ambulatory Visit: Payer: Self-pay | Admitting: Internal Medicine

## 2014-08-11 ENCOUNTER — Ambulatory Visit (INDEPENDENT_AMBULATORY_CARE_PROVIDER_SITE_OTHER): Payer: 59 | Admitting: Internal Medicine

## 2014-08-11 ENCOUNTER — Encounter: Payer: Self-pay | Admitting: Internal Medicine

## 2014-08-11 VITALS — BP 130/88 | HR 96 | Temp 98.0°F | Resp 16 | Ht 63.0 in | Wt 214.0 lb

## 2014-08-11 DIAGNOSIS — I809 Phlebitis and thrombophlebitis of unspecified site: Secondary | ICD-10-CM

## 2014-08-11 DIAGNOSIS — M79641 Pain in right hand: Secondary | ICD-10-CM

## 2014-08-11 NOTE — Progress Notes (Signed)
Pre visit review using our clinic review tool, if applicable. No additional management support is needed unless otherwise documented below in the visit note. 

## 2014-08-11 NOTE — Progress Notes (Signed)
    Subjective:    HPI  C/o R hand painful spot x 1 wk     Wt Readings from Last 3 Encounters:  08/11/14 214 lb (97.07 kg)  03/17/14 201 lb (91.173 kg)  10/19/13 214 lb (97.07 kg)   BP Readings from Last 3 Encounters:  08/11/14 130/88  03/17/14 110/68  10/19/13 120/88     Review of Systems  Constitutional: Positive for fatigue. Negative for chills, activity change, appetite change and unexpected weight change.  HENT: Negative for mouth sores and sinus pressure.   Eyes: Negative for visual disturbance.  Respiratory: Negative for chest tightness.   Genitourinary: Negative for difficulty urinating and vaginal pain.  Musculoskeletal: Negative for back pain and gait problem.  Skin: Negative for pallor.  Neurological: Negative for dizziness, tremors, weakness and numbness.  Psychiatric/Behavioral: Negative for confusion and sleep disturbance.         Objective:   Physical Exam   R palm 4x6 mm spot is tender   Procedure Note :    Procedure :   Point of care (POC) sonography examination   Indication:   Equipment used: Sonosite M-Turbo with HFL38x/13-6 MHz transducer linear probe. The images were stored in the unit and later transferred in storage.  The patient was placed in a decubitus position.  This study revealed a hypoechoic a segment of a non-compressible superficial vein, short   Impression: superficial phlebitis, dorsal R palm short segment    Lab Results  Component Value Date   WBC 4.7 11/03/2012   HGB 12.3 11/03/2012   HCT 36.8 11/03/2012   PLT 229.0 11/03/2012   GLUCOSE 121* 11/03/2012   CHOL 180 07/06/2012   TRIG 76.0 07/06/2012   HDL 72.50 07/06/2012   LDLCALC 92 07/06/2012   ALT 14 10/21/2012   AST 22 10/21/2012   NA 136 11/03/2012   K 3.6 11/03/2012   CL 98 11/03/2012   CREATININE 0.6 11/03/2012   BUN 12 11/03/2012   CO2 29 11/03/2012   TSH 0.67 09/20/2012   INR 0.9 RATIO 03/15/2007   HGBA1C 5.8 09/20/2012          Assessment & Plan:

## 2014-08-11 NOTE — Assessment & Plan Note (Signed)
[place] Naproxen Warm compress

## 2014-08-11 NOTE — Patient Instructions (Signed)
Warm compress Aspirin 325 mg  3 times a day x1 week

## 2014-08-16 ENCOUNTER — Other Ambulatory Visit: Payer: Self-pay | Admitting: Obstetrics and Gynecology

## 2014-08-16 LAB — HM PAP SMEAR

## 2014-08-16 LAB — HM MAMMOGRAPHY: HM MAMMO: NORMAL

## 2014-08-17 LAB — CYTOLOGY - PAP

## 2014-08-30 ENCOUNTER — Encounter: Payer: Self-pay | Admitting: Internal Medicine

## 2014-10-10 ENCOUNTER — Ambulatory Visit (INDEPENDENT_AMBULATORY_CARE_PROVIDER_SITE_OTHER): Payer: 59 | Admitting: Internal Medicine

## 2014-10-10 ENCOUNTER — Encounter: Payer: Self-pay | Admitting: Internal Medicine

## 2014-10-10 ENCOUNTER — Other Ambulatory Visit (INDEPENDENT_AMBULATORY_CARE_PROVIDER_SITE_OTHER): Payer: 59

## 2014-10-10 VITALS — BP 110/80 | HR 86 | Temp 98.6°F | Wt 218.8 lb

## 2014-10-10 DIAGNOSIS — R5382 Chronic fatigue, unspecified: Secondary | ICD-10-CM

## 2014-10-10 DIAGNOSIS — I809 Phlebitis and thrombophlebitis of unspecified site: Secondary | ICD-10-CM

## 2014-10-10 DIAGNOSIS — I10 Essential (primary) hypertension: Secondary | ICD-10-CM

## 2014-10-10 LAB — CBC WITH DIFFERENTIAL/PLATELET
BASOS PCT: 0.4 % (ref 0.0–3.0)
Basophils Absolute: 0 10*3/uL (ref 0.0–0.1)
Eosinophils Absolute: 0.1 10*3/uL (ref 0.0–0.7)
Eosinophils Relative: 2.3 % (ref 0.0–5.0)
HEMATOCRIT: 36.9 % (ref 36.0–46.0)
HEMOGLOBIN: 12.5 g/dL (ref 12.0–15.0)
Lymphocytes Relative: 30.6 % (ref 12.0–46.0)
Lymphs Abs: 1.8 10*3/uL (ref 0.7–4.0)
MCHC: 33.9 g/dL (ref 30.0–36.0)
MCV: 79.5 fl (ref 78.0–100.0)
Monocytes Absolute: 0.4 10*3/uL (ref 0.1–1.0)
Monocytes Relative: 6.7 % (ref 3.0–12.0)
NEUTROS PCT: 60 % (ref 43.0–77.0)
Neutro Abs: 3.5 10*3/uL (ref 1.4–7.7)
Platelets: 186 10*3/uL (ref 150.0–400.0)
RBC: 4.65 Mil/uL (ref 3.87–5.11)
RDW: 15.1 % (ref 11.5–15.5)
WBC: 5.8 10*3/uL (ref 4.0–10.5)

## 2014-10-10 LAB — URINALYSIS
BILIRUBIN URINE: NEGATIVE
Hgb urine dipstick: NEGATIVE
Ketones, ur: 15 — AB
Leukocytes, UA: NEGATIVE
NITRITE: NEGATIVE
SPECIFIC GRAVITY, URINE: 1.025 (ref 1.000–1.030)
Total Protein, Urine: NEGATIVE
UROBILINOGEN UA: 0.2 (ref 0.0–1.0)
Urine Glucose: NEGATIVE
pH: 6 (ref 5.0–8.0)

## 2014-10-10 LAB — HEPATIC FUNCTION PANEL
ALT: 21 U/L (ref 0–35)
AST: 25 U/L (ref 0–37)
Albumin: 4.3 g/dL (ref 3.5–5.2)
Alkaline Phosphatase: 54 U/L (ref 39–117)
BILIRUBIN DIRECT: 0.1 mg/dL (ref 0.0–0.3)
TOTAL PROTEIN: 7.8 g/dL (ref 6.0–8.3)
Total Bilirubin: 0.3 mg/dL (ref 0.2–1.2)

## 2014-10-10 LAB — BASIC METABOLIC PANEL
BUN: 14 mg/dL (ref 6–23)
CO2: 33 mEq/L — ABNORMAL HIGH (ref 19–32)
Calcium: 10 mg/dL (ref 8.4–10.5)
Chloride: 98 mEq/L (ref 96–112)
Creatinine, Ser: 0.69 mg/dL (ref 0.40–1.20)
GFR: 118.5 mL/min (ref >60.00)
GLUCOSE: 91 mg/dL (ref 70–99)
Potassium: 3.7 mEq/L (ref 3.5–5.1)
Sodium: 136 mEq/L (ref 135–145)

## 2014-10-10 LAB — IBC PANEL
Iron: 65 ug/dL (ref 42–145)
Saturation Ratios: 16.1 % — ABNORMAL LOW (ref 20.0–50.0)
Transferrin: 288 mg/dL (ref 212.0–360.0)

## 2014-10-10 LAB — HEMOGLOBIN A1C: Hgb A1c MFr Bld: 5.7 % (ref 4.6–6.5)

## 2014-10-10 LAB — TSH: TSH: 0.51 u[IU]/mL (ref 0.35–4.50)

## 2014-10-10 MED ORDER — PNV PRENATAL PLUS MULTIVITAMIN 27-1 MG PO TABS: 1.0000 | ORAL_TABLET | Freq: Every day | ORAL | Status: DC

## 2014-10-10 MED ORDER — NALTREXONE-BUPROPION HCL ER 8-90 MG PO TB12: ORAL_TABLET | ORAL | Status: DC

## 2014-10-10 NOTE — Progress Notes (Signed)
Pre visit review using our clinic review tool, if applicable. No additional management support is needed unless otherwise documented below in the visit note. 

## 2014-10-10 NOTE — Assessment & Plan Note (Signed)
On Tribenzor 

## 2014-10-10 NOTE — Progress Notes (Signed)
   Subjective:    Patient ID: [name], female    DOB: Aug 17, 1970, 44 y.o.   MRN: [id number]  HPI  C/o fatigue and wt gain  The patient presents for a follow-up of  chronic hypertension, chronic anemia, elevated glucose  Review of Systems  Constitutional: Positive for fatigue and unexpected weight change. Negative for chills, activity change and appetite change.  HENT: Negative for congestion, mouth sores and sinus pressure.   Eyes: Negative for visual disturbance.  Respiratory: Negative for cough and chest tightness.   Gastrointestinal: Negative for nausea, vomiting and abdominal pain.  Endocrine: Negative for polyphagia and polyuria.  Genitourinary: Positive for menstrual problem. Negative for urgency, frequency, difficulty urinating and vaginal pain.  Musculoskeletal: Negative for back pain and gait problem.  Skin: Negative for pallor and rash.  Neurological: Negative for dizziness, tremors, syncope, weakness, numbness and headaches.  Psychiatric/Behavioral: Negative for suicidal ideas, confusion, sleep disturbance and dysphoric mood. The patient is not nervous/anxious.        Objective:   Physical Exam  Constitutional: She appears well-developed. No distress.  HENT:  Head: Normocephalic.  Right Ear: External ear normal.  Left Ear: External ear normal.  Nose: Nose normal.  Mouth/Throat: Oropharynx is clear and moist.  Eyes: Conjunctivae are normal. Pupils are equal, round, and reactive to light. Right eye exhibits no discharge. Left eye exhibits no discharge.  Neck: Normal range of motion. Neck supple. No JVD present. No tracheal deviation present. No thyromegaly present.  Cardiovascular: Normal rate, regular rhythm and normal heart sounds.   Pulmonary/Chest: No stridor. No respiratory distress. She has no wheezes.  Abdominal: Soft. Bowel sounds are normal. She exhibits no distension and no mass. There is no tenderness. There is no rebound and no guarding.    Musculoskeletal: She exhibits no edema or tenderness.  Lymphadenopathy:    She has no cervical adenopathy.  Neurological: She displays normal reflexes. No cranial nerve deficit. She exhibits normal muscle tone. Coordination normal.  Skin: No rash noted. No erythema.  Psychiatric: She has a normal mood and affect. Her behavior is normal. Judgment and thought content normal.  Obese        Assessment & Plan:

## 2014-10-10 NOTE — Assessment & Plan Note (Signed)
Resolved

## 2014-10-10 NOTE — Patient Instructions (Signed)
Low carb diet  [email or web address]

## 2014-10-10 NOTE — Assessment & Plan Note (Signed)
Refractory   3/16 start Contrave. Potential benefits of a long term Contrave  use as well as potential risks  and complications were explained to the patient and were aknowledged. Low carb diet

## 2014-10-10 NOTE — Assessment & Plan Note (Signed)
Labs Loose wt

## 2014-10-11 LAB — VITAMIN D 25 HYDROXY (VIT D DEFICIENCY, FRACTURES): VITD: 24.55 ng/mL — ABNORMAL LOW (ref 30.00–100.00)

## 2014-10-11 LAB — VITAMIN B12: VITAMIN B 12: 1129 pg/mL — AB (ref 211–911)

## 2014-10-11 MED ORDER — ERGOCALCIFEROL 1.25 MG (50000 UT) PO CAPS: 50000.0000 [IU] | ORAL_CAPSULE | ORAL | Status: DC

## 2014-11-03 ENCOUNTER — Telehealth: Payer: Self-pay | Admitting: *Deleted

## 2014-11-03 ENCOUNTER — Other Ambulatory Visit: Payer: Self-pay | Admitting: Internal Medicine

## 2014-11-03 NOTE — Telephone Encounter (Signed)
Left msg on triage requesting md to rx something her her allergies. MD is out of office pls advise....Johny Chess

## 2014-11-06 NOTE — Telephone Encounter (Signed)
Use Claritin and Flonase; both are OTC Thx

## 2014-11-07 ENCOUNTER — Telehealth: Payer: Self-pay

## 2014-11-07 NOTE — Telephone Encounter (Signed)
Patient advised about meds to take for allergies per dr Alain Marion

## 2014-11-07 NOTE — Telephone Encounter (Signed)
Called pt no answer LMOM with md response.../lmb 

## 2014-11-08 ENCOUNTER — Other Ambulatory Visit: Payer: Self-pay | Admitting: Internal Medicine

## 2014-12-12 ENCOUNTER — Telehealth: Payer: Self-pay | Admitting: Internal Medicine

## 2014-12-12 NOTE — Telephone Encounter (Signed)
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

## 2014-12-12 NOTE — Telephone Encounter (Signed)
Pt called and had some question about some shots she had gotten. She would not give me any other info then that.  She just wanted to talk with you.     Best number [phone]

## 2014-12-13 ENCOUNTER — Other Ambulatory Visit: Payer: Self-pay | Admitting: *Deleted

## 2014-12-13 DIAGNOSIS — Z789 Other specified health status: Secondary | ICD-10-CM

## 2014-12-13 NOTE — Telephone Encounter (Signed)
Back called in to give info to someone.    Can you call her back when you get a chance [phone]

## 2014-12-18 ENCOUNTER — Other Ambulatory Visit: Payer: 59

## 2014-12-18 ENCOUNTER — Ambulatory Visit (INDEPENDENT_AMBULATORY_CARE_PROVIDER_SITE_OTHER): Payer: 59 | Admitting: *Deleted

## 2014-12-18 DIAGNOSIS — Z23 Encounter for immunization: Secondary | ICD-10-CM | POA: Diagnosis not present

## 2014-12-18 DIAGNOSIS — Z789 Other specified health status: Secondary | ICD-10-CM

## 2014-12-19 LAB — MEASLES/MUMPS/RUBELLA IMMUNITY
Mumps IgG: 67.6 AU/mL — ABNORMAL HIGH (ref <9.00)
RUBELLA: 3.66 {index} — AB (ref <0.90)
Rubeola IgG: 255 AU/mL — ABNORMAL HIGH (ref <25.00)

## 2014-12-20 LAB — TB SKIN TEST
INDURATION: 0 mm
TB SKIN TEST: NEGATIVE

## 2014-12-27 ENCOUNTER — Encounter: Payer: Self-pay | Admitting: Internal Medicine

## 2014-12-27 ENCOUNTER — Ambulatory Visit (INDEPENDENT_AMBULATORY_CARE_PROVIDER_SITE_OTHER): Payer: 59 | Admitting: Internal Medicine

## 2014-12-27 VITALS — BP 130/90 | HR 95 | Ht 63.0 in | Wt 222.0 lb

## 2014-12-27 DIAGNOSIS — Z0000 Encounter for general adult medical examination without abnormal findings: Secondary | ICD-10-CM

## 2014-12-27 NOTE — Progress Notes (Signed)
   Subjective:    HPI  The patient is here for a wellness exam for school. The patient has been doing well overall without major physical or psychological issues going on lately. The patient presents for a follow-up of  chronic hypertension, chronic anemia, elevated glucose  Wt Readings from Last 3 Encounters:  12/27/14 222 lb (100.699 kg)  10/10/14 218 lb 12 oz (99.224 kg)  08/11/14 214 lb (97.07 kg)   BP Readings from Last 3 Encounters:  12/27/14 130/90  10/10/14 110/80  08/11/14 130/88     Review of Systems  Constitutional: Positive for fatigue and unexpected weight change. Negative for chills, activity change and appetite change.  HENT: Negative for congestion, mouth sores and sinus pressure.   Eyes: Negative for visual disturbance.  Respiratory: Negative for cough and chest tightness.   Gastrointestinal: Negative for nausea, vomiting and abdominal pain.  Endocrine: Negative for polyphagia and polyuria.  Genitourinary: Positive for menstrual problem. Negative for urgency, frequency, difficulty urinating and vaginal pain.  Musculoskeletal: Negative for back pain and gait problem.  Skin: Negative for pallor and rash.  Neurological: Negative for dizziness, tremors, syncope, weakness, numbness and headaches.  Psychiatric/Behavioral: Negative for suicidal ideas, confusion, sleep disturbance and dysphoric mood. The patient is not nervous/anxious.        Objective:   Physical Exam  Constitutional: She appears well-developed. No distress.  HENT:  Head: Normocephalic.  Right Ear: External ear normal.  Left Ear: External ear normal.  Nose: Nose normal.  Mouth/Throat: Oropharynx is clear and moist.  Eyes: Conjunctivae are normal. Pupils are equal, round, and reactive to light. Right eye exhibits no discharge. Left eye exhibits no discharge.  Neck: Normal range of motion. Neck supple. No JVD present. No tracheal deviation present. No thyromegaly present.  Cardiovascular: Normal  rate, regular rhythm and normal heart sounds.   Pulmonary/Chest: No stridor. No respiratory distress. She has no wheezes.  Abdominal: Soft. Bowel sounds are normal. She exhibits no distension and no mass. There is no tenderness. There is no rebound and no guarding.  Musculoskeletal: She exhibits no edema or tenderness.  Lymphadenopathy:    She has no cervical adenopathy.  Neurological: She displays normal reflexes. No cranial nerve deficit. She exhibits normal muscle tone. Coordination normal.  Skin: No rash noted. No erythema.  Psychiatric: She has a normal mood and affect. Her behavior is normal. Judgment and thought content normal.  Obese  Lab Results  Component Value Date   WBC 5.8 10/10/2014   HGB 12.5 10/10/2014   HCT 36.9 10/10/2014   PLT 186.0 10/10/2014   GLUCOSE 91 10/10/2014   CHOL 180 07/06/2012   TRIG 76.0 07/06/2012   HDL 72.50 07/06/2012   LDLCALC 92 07/06/2012   ALT 21 10/10/2014   AST 25 10/10/2014   NA 136 10/10/2014   K 3.7 10/10/2014   CL 98 10/10/2014   CREATININE 0.69 10/10/2014   BUN 14 10/10/2014   CO2 33* 10/10/2014   TSH 0.51 10/10/2014   INR 0.9 RATIO 03/15/2007   HGBA1C 5.7 10/10/2014         Assessment & Plan:

## 2014-12-27 NOTE — Assessment & Plan Note (Signed)
We discussed age appropriate health related issues, including available/recomended screening tests and vaccinations. We discussed a need for adhering to healthy diet and exercise. Labs/EKG were reviewed/ordered. All questions were answered. For filled out

## 2014-12-27 NOTE — Progress Notes (Signed)
Pre visit review using our clinic review tool, if applicable. No additional management support is needed unless otherwise documented below in the visit note. 

## 2015-01-09 ENCOUNTER — Other Ambulatory Visit: Payer: Self-pay | Admitting: *Deleted

## 2015-01-09 MED ORDER — OLMESARTAN-AMLODIPINE-HCTZ 40-10-25 MG PO TABS: 1.0000 | ORAL_TABLET | Freq: Every day | ORAL | Status: DC

## 2015-02-06 ENCOUNTER — Ambulatory Visit (INDEPENDENT_AMBULATORY_CARE_PROVIDER_SITE_OTHER): Payer: 59

## 2015-02-06 DIAGNOSIS — Z111 Encounter for screening for respiratory tuberculosis: Secondary | ICD-10-CM

## 2015-02-08 ENCOUNTER — Ambulatory Visit: Payer: 59

## 2015-02-09 ENCOUNTER — Ambulatory Visit: Payer: 59

## 2015-02-09 LAB — TB SKIN TEST
Induration: 0 mm
TB Skin Test: NEGATIVE

## 2015-03-15 DIAGNOSIS — E669 Obesity, unspecified: Secondary | ICD-10-CM | POA: Insufficient documentation

## 2015-04-11 ENCOUNTER — Telehealth: Payer: Self-pay | Admitting: *Deleted

## 2015-04-11 NOTE — Telephone Encounter (Signed)
Rf req for Nuvigil 150 mg 1 po qd. Ok to Rf?

## 2015-04-13 MED ORDER — ARMODAFINIL 150 MG PO TABS: 150.0000 mg | ORAL_TABLET | Freq: Every day | ORAL | Status: DC

## 2015-04-13 NOTE — Telephone Encounter (Signed)
OK to fill this prescription with additional refills x2 Thank you!  

## 2015-04-13 NOTE — Telephone Encounter (Signed)
Done. See meds.  

## 2015-05-15 ENCOUNTER — Ambulatory Visit (INDEPENDENT_AMBULATORY_CARE_PROVIDER_SITE_OTHER): Payer: 59

## 2015-05-15 ENCOUNTER — Other Ambulatory Visit: Payer: Self-pay | Admitting: Internal Medicine

## 2015-05-15 DIAGNOSIS — Z23 Encounter for immunization: Secondary | ICD-10-CM

## 2015-05-17 ENCOUNTER — Ambulatory Visit: Payer: 59

## 2015-06-07 ENCOUNTER — Telehealth: Payer: Self-pay

## 2015-06-07 NOTE — Telephone Encounter (Signed)
Faxed pharmacy requesting rx bin, pcn and grp number to initiate PA.

## 2015-06-08 ENCOUNTER — Other Ambulatory Visit: Payer: Self-pay | Admitting: Internal Medicine

## 2015-06-08 ENCOUNTER — Telehealth: Payer: Self-pay

## 2015-06-08 NOTE — Telephone Encounter (Signed)
Ins will not cover singulair. Alternative?

## 2015-06-10 NOTE — Telephone Encounter (Signed)
Is Accolate covered? Thx

## 2015-06-11 MED ORDER — ZAFIRLUKAST 20 MG PO TABS: 20.0000 mg | ORAL_TABLET | Freq: Two times a day (BID) | ORAL | Status: DC

## 2015-06-11 NOTE — Telephone Encounter (Signed)
Accolate is 20 mg bid Thx

## 2015-06-11 NOTE — Telephone Encounter (Signed)
We can send it. I can complete a Pa if denial comes back. I can order. How many Mg? And how often?

## 2015-06-21 ENCOUNTER — Telehealth: Payer: Self-pay | Admitting: Internal Medicine

## 2015-06-21 NOTE — Telephone Encounter (Signed)
Patient states that zafirlukast was sent to her pharmacy.  She states that this med is not her med.  She believes it was sent in by mistake.  Can you please follow up with in regards.

## 2015-06-22 NOTE — Telephone Encounter (Signed)
This medication was sent in to replace the montelukast that her insurance denied after the PA was submitted.   Tried to call pt back. vm was full and not able to leave a message.   The zafirlukast is not a mistake.

## 2015-07-05 NOTE — Telephone Encounter (Signed)
Pt has called back (she cleared her vm) and she states that the zafirlukast is over $70. Is there something else that she can take. She seems confused as to why the insurance has denied her singulair.

## 2015-07-06 MED ORDER — CETIRIZINE HCL 10 MG PO TABS: 10.0000 mg | ORAL_TABLET | Freq: Every day | ORAL | Status: DC

## 2015-07-06 NOTE — Addendum Note (Signed)
Addended by: Cassandria Anger on: 07/06/2015 05:17 PM   Modules accepted: Orders, Medications

## 2015-07-06 NOTE — Telephone Encounter (Signed)
Is there another option for pt?

## 2015-07-06 NOTE — Telephone Encounter (Signed)
Try Zyrtec 10 mg/d Thx

## 2015-07-09 MED ORDER — CETIRIZINE HCL 10 MG PO TABS: 10.0000 mg | ORAL_TABLET | Freq: Every day | ORAL | Status: DC

## 2015-07-09 NOTE — Addendum Note (Signed)
Addended by: Aviva Signs M on: 07/09/2015 03:15 PM   Modules accepted: Orders

## 2015-07-09 NOTE — Telephone Encounter (Signed)
erx resent after confirming with pt that alternative was okay to try.   Pt will call back if there are any additional questions.

## 2015-07-24 ENCOUNTER — Other Ambulatory Visit: Payer: Self-pay

## 2015-07-24 MED ORDER — FERROUS SULFATE 325 (65 FE) MG PO TABS: 325.0000 mg | ORAL_TABLET | Freq: Two times a day (BID) | ORAL | Status: DC

## 2015-09-11 ENCOUNTER — Encounter: Payer: Self-pay | Admitting: Internal Medicine

## 2015-09-11 ENCOUNTER — Ambulatory Visit (INDEPENDENT_AMBULATORY_CARE_PROVIDER_SITE_OTHER): Payer: 59 | Admitting: Internal Medicine

## 2015-09-11 ENCOUNTER — Other Ambulatory Visit: Payer: Self-pay | Admitting: Obstetrics and Gynecology

## 2015-09-11 VITALS — BP 90/68 | HR 81 | Temp 98.0°F | Wt 212.0 lb

## 2015-09-11 DIAGNOSIS — J069 Acute upper respiratory infection, unspecified: Secondary | ICD-10-CM | POA: Diagnosis not present

## 2015-09-11 MED ORDER — AZITHROMYCIN 250 MG PO TABS
ORAL_TABLET | ORAL | Status: DC
Start: 2015-09-11 — End: 2016-01-01

## 2015-09-11 NOTE — Progress Notes (Signed)
Pre visit review using our clinic review tool, if applicable. No additional management support is needed unless otherwise documented below in the visit note. 

## 2015-09-11 NOTE — Progress Notes (Signed)
Subjective:  Patient ID: [name], female    DOB: 15-Mar-1971  Age: 45 y.o. MRN: [id number]  CC: No chief complaint on file.   HPI [name] presents for harshness, head congesion, earache - can't breath at night x 1 week  Outpatient Prescriptions Prior to Visit  Medication Sig Dispense Refill  . acyclovir (ZOVIRAX) 400 MG tablet Take 1 tablet (400 mg total) by mouth 3 (three) times daily. 21 tablet 0  . ALPRAZolam (XANAX) 0.5 MG tablet Take 0.5 mg by mouth at bedtime.    . Armodafinil (NUVIGIL) 150 MG tablet Take 1 tablet (150 mg total) by mouth daily. 30 tablet 2  . Ascorbic Acid (VITAMIN C) 100 MG tablet Take 200 mg by mouth 2 (two) times daily.      . cetirizine (ZYRTEC) 10 MG tablet Take 1 tablet (10 mg total) by mouth daily. 90 tablet 3  . Cholecalciferol (EQL VITAMIN D3) 1000 UNITS tablet Take 1,000 Units by mouth daily.      . cyclobenzaprine (FLEXERIL) 10 MG tablet Take 10-20 mg by mouth at bedtime. Take 1-2 qhs    . ergocalciferol (VITAMIN D2) 50000 UNITS capsule Take 1 capsule (50,000 Units total) by mouth once a week. 6 capsule 0  . ferrous sulfate 325 (65 FE) MG tablet Take 1 tablet (325 mg total) by mouth 2 (two) times daily. 446 tablet 3  . folic acid (FOLVITE) 286 MCG tablet Take 1 tablet (400 mcg total) by mouth daily. 90 tablet 3  . Naltrexone-Bupropion HCl ER (CONTRAVE) 8-90 MG TB12 2 tabs po bid 120 tablet 3  . naproxen (NAPROSYN) 500 MG tablet Take 1 tablet (500 mg total) by mouth 2 (two) times daily with a meal. Use prn 60 tablet 3  . Olmesartan-Amlodipine-HCTZ (TRIBENZOR) 40-10-25 MG TABS Take 1 tablet by mouth daily. 30 tablet 11  . omeprazole (PRILOSEC OTC) 20 MG tablet Take 1 tablet (20 mg total) by mouth daily. 10 tablet 0  . ondansetron (ZOFRAN ODT) 4 MG disintegrating tablet Take 1 tablet (4 mg total) by mouth every 8 (eight) hours as needed for nausea. 20 tablet 0  . peg 3350 powder (MOVIPREP) 100 G SOLR Take as directed 1 kit 0  . Prenatal  Vit-Fe Fumarate-FA (PNV PRENATAL PLUS MULTIVITAMIN) 27-1 MG TABS Take 1 tablet by mouth daily. 90 tablet 3  . vitamin B-12 (CYANOCOBALAMIN) 1000 MCG tablet Take 1 tablet (1,000 mcg total) by mouth every other day. 100 tablet 3  . zafirlukast (ACCOLATE) 20 MG tablet Take 1 tablet (20 mg total) by mouth 2 (two) times daily before a meal. 180 tablet 3   No facility-administered medications prior to visit.    ROS Review of Systems  Constitutional: Negative for chills, activity change, appetite change, fatigue and unexpected weight change.  HENT: Positive for congestion, postnasal drip, sinus pressure and trouble swallowing. Negative for mouth sores.   Eyes: Negative for visual disturbance.  Respiratory: Negative for cough and chest tightness.   Gastrointestinal: Negative for nausea and abdominal pain.  Genitourinary: Negative for frequency, difficulty urinating and vaginal pain.  Musculoskeletal: Negative for back pain and gait problem.  Skin: Negative for pallor and rash.  Neurological: Negative for dizziness, tremors, weakness, numbness and headaches.  Psychiatric/Behavioral: Negative for suicidal ideas, confusion and sleep disturbance.    Objective:  BP 90/68 mmHg  Pulse 81  Temp(Src) 98 F (36.7 C) (Oral)  Wt 212 lb (96.163 kg)  SpO2 99%  BP Readings from Last 3 Encounters:  09/11/15  90/68  12/27/14 130/90  10/10/14 110/80    Wt Readings from Last 3 Encounters:  09/11/15 212 lb (96.163 kg)  12/27/14 222 lb (100.699 kg)  10/10/14 218 lb 12 oz (99.224 kg)    Physical Exam  Constitutional: She appears well-developed. No distress.  HENT:  Head: Normocephalic.  Right Ear: External ear normal.  Left Ear: External ear normal.  Mouth/Throat: No oropharyngeal exudate.  Eyes: Conjunctivae are normal. Pupils are equal, round, and reactive to light. Right eye exhibits no discharge. Left eye exhibits no discharge.  Neck: Normal range of motion. Neck supple. No JVD present. No  tracheal deviation present. No thyromegaly present.  Cardiovascular: Normal rate, regular rhythm and normal heart sounds.   Pulmonary/Chest: No stridor. No respiratory distress. She has no wheezes.  Abdominal: Soft. Bowel sounds are normal. She exhibits no distension and no mass. There is no tenderness. There is no rebound and no guarding.  Musculoskeletal: She exhibits no edema or tenderness.  Lymphadenopathy:    She has no cervical adenopathy.  Neurological: She displays normal reflexes. No cranial nerve deficit. She exhibits normal muscle tone. Coordination normal.  Skin: No rash noted. No erythema.  Psychiatric: She has a normal mood and affect. Her behavior is normal. Judgment and thought content normal.  eryth nares eryth throat mucosa  Lab Results  Component Value Date   WBC 5.8 10/10/2014   HGB 12.5 10/10/2014   HCT 36.9 10/10/2014   PLT 186.0 10/10/2014   GLUCOSE 91 10/10/2014   CHOL 180 07/06/2012   TRIG 76.0 07/06/2012   HDL 72.50 07/06/2012   LDLCALC 92 07/06/2012   ALT 21 10/10/2014   AST 25 10/10/2014   NA 136 10/10/2014   K 3.7 10/10/2014   CL 98 10/10/2014   CREATININE 0.69 10/10/2014   BUN 14 10/10/2014   CO2 33* 10/10/2014   TSH 0.51 10/10/2014   INR 0.9 RATIO 03/15/2007   HGBA1C 5.7 10/10/2014    Ct Abdomen Wo Contrast  10/22/2012  *RADIOLOGY REPORT* Clinical Data: Adrenal gland abnormality. CT ABDOMEN WITHOUT CONTRAST Technique:  Multidetector CT imaging of the abdomen was performed following the standard protocol without IV contrast. Comparison: 10/21/2012 Findings: Dependent subsegmental atelectasis observed in the lower lobes. Anterior right adrenal nodule 2.3 x 1.3 cm, internal density 27 HU. Posterior adrenal nodule 1.3 x 1.2 cm, internal density 23 HU. When I calculate the relative washout ratio is of these lesions from yesterday's study, the more anterior lesion has a washout ratio 49% (favoring adenoma) and the posterior lesion has a washout ratio of  30% (indeterminate). No left renal lesion.  Wall thickening and pericolic stranding observed in the right colon and to a lesser extent in the transverse colon.  Normal appendix. IMPRESSION: 1.  Both adrenal nodules are indeterminate by today's noncontrast CT scan.  Based on the washout ratio as on yesterday's exam, the anterior nodule is most consistent with an adenoma, whereas the posterior nodule remains indeterminate for lipid poor adenoma or another type of mass.  Adrenal protocol MRI with without contrast could be utilized for further workup.  Or, these could be observed. 2.  Prominent proximal colitis. Original Report Authenticated By: Van Clines, M.D.   Ct Abdomen Pelvis W Contrast  10/21/2012  *RADIOLOGY REPORT* Clinical Data: Abdominal pain and rule out appendicitis. CT ABDOMEN AND PELVIS WITH CONTRAST Technique:  Multidetector CT imaging of the abdomen and pelvis was performed following the standard protocol during bolus administration of intravenous contrast. Contrast: 196m OMNIPAQUE IOHEXOL 300  MG/ML  SOLN, 53m OMNIPAQUE IOHEXOL 300 MG/ML  SOLN Comparison: None. Findings: The lung bases are clear bilaterally.  No evidence for free intraperitoneal air. Normal appearance of the liver, portal venous system and gallbladder.  Normal appearance of the spleen, pancreas and left adrenal gland. There are two nodules involving the right adrenal gland and the largest measures 2.7 cm.  These nodules are nonspecific on this postcontrast examination.  Small low density area in the left kidney is suggestive for a cyst.  Otherwise, normal appearance of both kidneys. There is a large amount of edema around the hepatic flexure of the colon.  There is concern for diffuse wall thickening of the right colon.  There are prominent lymph nodes in the right ileocolic mesentery.  The appendix has a normal appearance.  No gross abnormality to the terminal ileum.  There appears to be wall thickening of the right and mid  transverse colon. The uterus is nodular and heterogeneous.  Calcification within the uterus.  Findings are suggestive for fibroids.  There is a slightly dense structure near the right ovary that roughly measures 3.4 cm. This could represent a complex or hemorrhagic cyst. There may be a small amount of fluid in the right lower quadrant near small bowel loops. The patient has an umbilical hernia containing loops of small bowel.  No evidence to suggest a small bowel obstruction. No acute bony abnormality. IMPRESSION: There is extensive edema and inflammation around the right colon. Findings are suggestive for colitis.  There are also prominent lymph nodes in the right ileocecal mesentery.  Recommend follow-up CT or colonoscopy to ensure resolution. The right ovary and adnexal structures are poorly characterized but cannot exclude a 3.4 cm complex cyst or hemorrhagic right cyst. Consider a follow-up pelvic ultrasound in 6-8 weeks. Nodular appearance of the uterus is suggestive for fibroids. Umbilical hernia containing small bowel.  No evidence for a small bowel obstruction. Right adrenal nodules, the largest measures 2.7 cm.  These nodules are indeterminate on this postcontrast study.  This could be further characterized with a noncontrast abdominal CT or MRI. Original Report Authenticated By: AMarkus Daft M.D.    Assessment & Plan:   There are no diagnoses linked to this encounter. I am having Ms. Rossell maintain her cyclobenzaprine, vitamin C, Cholecalciferol, folic acid, vitamin BP-91 ALPRAZolam, ondansetron, omeprazole, peg 3350 powder, acyclovir, naproxen, PNV PRENATAL PLUS MULTIVITAMIN, Naltrexone-Bupropion HCl ER, ergocalciferol, Olmesartan-Amlodipine-HCTZ, Armodafinil, zafirlukast, cetirizine, ferrous sulfate, phentermine, topiramate, Clocortolone Pivalate Pump, and Azelaic Acid.  Meds ordered this encounter  Medications  . phentermine (ADIPEX-P) 37.5 MG tablet    Sig: Take 1 tablet by mouth daily.     Refill:  0  . topiramate (TOPAMAX) 50 MG tablet    Sig: Take 1 tablet by mouth daily.    Refill:  2  . Clocortolone Pivalate Pump 0.1 % CREA    Sig: as needed.  . Azelaic Acid (FINACEA) 15 % FOAM    Sig: as needed.     Follow-up: No Follow-up on file.  AWalker Kehr MD

## 2015-09-11 NOTE — Patient Instructions (Signed)
Use over-the-counter  "cold" medicines  such as  "Afrin" nasal spray for nasal congestion as directed instead. Use" Delsym" or" Robitussin" cough syrup varietis for cough.  You can use plain "Tylenol" or "Advil" for fever, chills and achyness. Use Halls or Ricola cough drops.   "Common cold" symptoms are usually triggered by a virus.  The antibiotics are usually not necessary. Please, make an appointment if you are not better or if you're worse.

## 2015-09-11 NOTE — Assessment & Plan Note (Signed)
Zpac if worse 

## 2015-09-12 LAB — CYTOLOGY - PAP

## 2016-01-01 ENCOUNTER — Encounter: Payer: Self-pay | Admitting: Allergy and Immunology

## 2016-01-01 ENCOUNTER — Ambulatory Visit (INDEPENDENT_AMBULATORY_CARE_PROVIDER_SITE_OTHER): Payer: 59 | Admitting: Allergy and Immunology

## 2016-01-01 VITALS — BP 128/88 | HR 72 | Temp 98.4°F | Resp 20 | Ht 62.0 in | Wt 212.0 lb

## 2016-01-01 DIAGNOSIS — H1010 Acute atopic conjunctivitis, unspecified eye: Secondary | ICD-10-CM | POA: Diagnosis not present

## 2016-01-01 DIAGNOSIS — G4730 Sleep apnea, unspecified: Secondary | ICD-10-CM

## 2016-01-01 DIAGNOSIS — J309 Allergic rhinitis, unspecified: Secondary | ICD-10-CM | POA: Diagnosis not present

## 2016-01-01 DIAGNOSIS — S134XXA Sprain of ligaments of cervical spine, initial encounter: Secondary | ICD-10-CM | POA: Diagnosis not present

## 2016-01-01 MED ORDER — BEPOTASTINE BESILATE 1.5 % OP SOLN: OPHTHALMIC | Status: DC
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
## 2016-01-01 NOTE — Patient Instructions (Addendum)
  1. Allergen avoidance measures  2. Treat and prevent inflammation:    A. OTC Rhinocort one spray each nostril one time per day. Coupon.  B. prednisone 10 mg one tablet once a day for 10 days only  3. If needed:   A. nasal saline  B. Claritin 10 mg or Zyrtec 10 mg one time per day  C. Bepreve drop each eye twice a day. Coupon  4. Immunotherapy?  5. Sleep study?  6. Have neck issue evaluated by Dr. Alain Marion  7. Return to clinic in 4 weeks

## 2016-01-02 ENCOUNTER — Encounter: Payer: Self-pay | Admitting: Internal Medicine

## 2016-01-02 ENCOUNTER — Ambulatory Visit (INDEPENDENT_AMBULATORY_CARE_PROVIDER_SITE_OTHER)
Admission: RE | Admit: 2016-01-02 | Discharge: 2016-01-02 | Disposition: A | Discharge status: Home / Self Care | Payer: 59 | Source: Ambulatory Visit | Attending: Internal Medicine | Admitting: Internal Medicine

## 2016-01-02 ENCOUNTER — Ambulatory Visit (INDEPENDENT_AMBULATORY_CARE_PROVIDER_SITE_OTHER): Payer: 59 | Admitting: Internal Medicine

## 2016-01-02 ENCOUNTER — Other Ambulatory Visit: Payer: Self-pay | Admitting: Internal Medicine

## 2016-01-02 VITALS — BP 90/58 | HR 90 | Wt 213.0 lb

## 2016-01-02 DIAGNOSIS — S161XXA Strain of muscle, fascia and tendon at neck level, initial encounter: Secondary | ICD-10-CM | POA: Diagnosis not present

## 2016-01-02 DIAGNOSIS — M542 Cervicalgia: Secondary | ICD-10-CM | POA: Insufficient documentation

## 2016-01-02 MED ORDER — TRAMADOL HCL 50 MG PO TABS: 50.0000 mg | ORAL_TABLET | Freq: Two times a day (BID) | ORAL | Status: DC | PRN

## 2016-01-02 NOTE — Progress Notes (Signed)
Subjective:  Patient ID: [name], female    DOB: 04/13/1971  Age: 45 y.o. MRN: [id number]  CC: Neck Pain and Fall   HPI Talan Cartwright presents for a fall between a bathtub and a toilet on Mon - no direct injury - jarred. No LOC. The neck pain has developed the next day - the pain is bad now. Flexeril did not help. She saw her allergist on Tue - Prednisone was given.  Outpatient Prescriptions Prior to Visit  Medication Sig Dispense Refill  . ALPRAZolam (XANAX) 0.5 MG tablet Take 0.5 mg by mouth at bedtime.    . Ascorbic Acid (VITAMIN C) 100 MG tablet Take 200 mg by mouth 2 (two) times daily.      . Azelaic Acid (FINACEA) 15 % FOAM as needed.    . Bepotastine Besilate (BEPREVE) 1.5 % SOLN USE 1 DROP IN EACH EYE TWICE DAILY AS NEEDED FOR ITCHY EYES 10 mL 5  . Cholecalciferol (EQL VITAMIN D3) 1000 UNITS tablet Take 1,000 Units by mouth daily.      . Clocortolone Pivalate Pump 0.1 % CREA as needed.    . cyclobenzaprine (FLEXERIL) 10 MG tablet Take 10-20 mg by mouth at bedtime. Take 1-2 qhs    . ferrous sulfate 325 (65 FE) MG tablet Take 1 tablet (325 mg total) by mouth 2 (two) times daily. 180 tablet 3  . loratadine (CLARITIN) 10 MG tablet Take 10 mg by mouth daily.    . montelukast (SINGULAIR) 10 MG tablet Take 10 mg by mouth at bedtime.    . naproxen (NAPROSYN) 500 MG tablet Take 1 tablet (500 mg total) by mouth 2 (two) times daily with a meal. Use prn 60 tablet 3  . Olmesartan-Amlodipine-HCTZ (TRIBENZOR) 40-10-25 MG TABS Take 1 tablet by mouth daily. 30 tablet 11  . Prenatal Vit-Fe Fumarate-FA (PNV PRENATAL PLUS MULTIVITAMIN) 27-1 MG TABS Take 1 tablet by mouth daily. 90 tablet 3  . SAXENDA 18 MG/3ML SOPN Inject 5 mLs into the skin daily.  1  . vitamin B-12 (CYANOCOBALAMIN) 1000 MCG tablet Take 1 tablet (1,000 mcg total) by mouth every other day. 100 tablet 3   No facility-administered medications prior to visit.    ROS Review of Systems  Constitutional: Negative for  chills, activity change, appetite change, fatigue and unexpected weight change.  HENT: Negative for congestion, mouth sores and sinus pressure.   Eyes: Negative for visual disturbance.  Respiratory: Negative for cough and chest tightness.   Gastrointestinal: Negative for nausea and abdominal pain.  Genitourinary: Negative for frequency, difficulty urinating and vaginal pain.  Musculoskeletal: Positive for neck pain and neck stiffness. Negative for back pain and gait problem.  Skin: Negative for pallor and rash.  Neurological: Negative for dizziness, tremors, weakness, numbness and headaches.  Psychiatric/Behavioral: Negative for confusion and sleep disturbance.    Objective:  BP 90/58 mmHg  Pulse 90  Wt 213 lb (96.616 kg)  SpO2 99%  BP Readings from Last 3 Encounters:  01/02/16 90/58  01/01/16 128/88  09/11/15 90/68    Wt Readings from Last 3 Encounters:  01/02/16 213 lb (96.616 kg)  01/01/16 212 lb (96.163 kg)  09/11/15 212 lb (96.163 kg)    Physical Exam  Constitutional: She appears well-developed. No distress.  HENT:  Head: Normocephalic.  Right Ear: External ear normal.  Left Ear: External ear normal.  Nose: Nose normal.  Mouth/Throat: Oropharynx is clear and moist.  Eyes: Conjunctivae are normal. Pupils are equal, round, and reactive to light.  Right eye exhibits no discharge. Left eye exhibits no discharge.  Neck: Normal range of motion. Neck supple. No JVD present. No tracheal deviation present. No thyromegaly present.  Cardiovascular: Normal rate, regular rhythm and normal heart sounds.   Pulmonary/Chest: No stridor. No respiratory distress. She has no wheezes.  Abdominal: Soft. Bowel sounds are normal. She exhibits no distension and no mass. There is no tenderness. There is no rebound and no guarding.  Musculoskeletal: She exhibits tenderness. She exhibits no edema.  Lymphadenopathy:    She has no cervical adenopathy.  Neurological: She displays normal reflexes.  No cranial nerve deficit. She exhibits normal muscle tone. Coordination normal.  Skin: No rash noted. No erythema.  Psychiatric: She has a normal mood and affect. Her behavior is normal. Judgment and thought content normal.  neck is stiff and tender w/restricted ROM  Lab Results  Component Value Date   WBC 5.8 10/10/2014   HGB 12.5 10/10/2014   HCT 36.9 10/10/2014   PLT 186.0 10/10/2014   GLUCOSE 91 10/10/2014   CHOL 180 07/06/2012   TRIG 76.0 07/06/2012   HDL 72.50 07/06/2012   LDLCALC 92 07/06/2012   ALT 21 10/10/2014   AST 25 10/10/2014   NA 136 10/10/2014   K 3.7 10/10/2014   CL 98 10/10/2014   CREATININE 0.69 10/10/2014   BUN 14 10/10/2014   CO2 33* 10/10/2014   TSH 0.51 10/10/2014   INR 0.9 RATIO 03/15/2007   HGBA1C 5.7 10/10/2014    Ct Abdomen Wo Contrast  10/22/2012  *RADIOLOGY REPORT* Clinical Data: Adrenal gland abnormality. CT ABDOMEN WITHOUT CONTRAST Technique:  Multidetector CT imaging of the abdomen was performed following the standard protocol without IV contrast. Comparison: 10/21/2012 Findings: Dependent subsegmental atelectasis observed in the lower lobes. Anterior right adrenal nodule 2.3 x 1.3 cm, internal density 27 HU. Posterior adrenal nodule 1.3 x 1.2 cm, internal density 23 HU. When I calculate the relative washout ratio is of these lesions from yesterday's study, the more anterior lesion has a washout ratio 49% (favoring adenoma) and the posterior lesion has a washout ratio of 30% (indeterminate). No left renal lesion.  Wall thickening and pericolic stranding observed in the right colon and to a lesser extent in the transverse colon.  Normal appendix. IMPRESSION: 1.  Both adrenal nodules are indeterminate by today's noncontrast CT scan.  Based on the washout ratio as on yesterday's exam, the anterior nodule is most consistent with an adenoma, whereas the posterior nodule remains indeterminate for lipid poor adenoma or another type of mass.  Adrenal protocol MRI  with without contrast could be utilized for further workup.  Or, these could be observed. 2.  Prominent proximal colitis. Original Report Authenticated By: Van Clines, M.D.   Ct Abdomen Pelvis W Contrast  10/21/2012  *RADIOLOGY REPORT* Clinical Data: Abdominal pain and rule out appendicitis. CT ABDOMEN AND PELVIS WITH CONTRAST Technique:  Multidetector CT imaging of the abdomen and pelvis was performed following the standard protocol during bolus administration of intravenous contrast. Contrast: 121mL OMNIPAQUE IOHEXOL 300 MG/ML  SOLN, 12mL OMNIPAQUE IOHEXOL 300 MG/ML  SOLN Comparison: None. Findings: The lung bases are clear bilaterally.  No evidence for free intraperitoneal air. Normal appearance of the liver, portal venous system and gallbladder.  Normal appearance of the spleen, pancreas and left adrenal gland. There are two nodules involving the right adrenal gland and the largest measures 2.7 cm.  These nodules are nonspecific on this postcontrast examination.  Small low density area in the left kidney  is suggestive for a cyst.  Otherwise, normal appearance of both kidneys. There is a large amount of edema around the hepatic flexure of the colon.  There is concern for diffuse wall thickening of the right colon.  There are prominent lymph nodes in the right ileocolic mesentery.  The appendix has a normal appearance.  No gross abnormality to the terminal ileum.  There appears to be wall thickening of the right and mid transverse colon. The uterus is nodular and heterogeneous.  Calcification within the uterus.  Findings are suggestive for fibroids.  There is a slightly dense structure near the right ovary that roughly measures 3.4 cm. This could represent a complex or hemorrhagic cyst. There may be a small amount of fluid in the right lower quadrant near small bowel loops. The patient has an umbilical hernia containing loops of small bowel.  No evidence to suggest a small bowel obstruction. No acute  bony abnormality. IMPRESSION: There is extensive edema and inflammation around the right colon. Findings are suggestive for colitis.  There are also prominent lymph nodes in the right ileocecal mesentery.  Recommend follow-up CT or colonoscopy to ensure resolution. The right ovary and adnexal structures are poorly characterized but cannot exclude a 3.4 cm complex cyst or hemorrhagic right cyst. Consider a follow-up pelvic ultrasound in 6-8 weeks. Nodular appearance of the uterus is suggestive for fibroids. Umbilical hernia containing small bowel.  No evidence for a small bowel obstruction. Right adrenal nodules, the largest measures 2.7 cm.  These nodules are indeterminate on this postcontrast study.  This could be further characterized with a noncontrast abdominal CT or MRI. Original Report Authenticated By: Markus Daft, M.D.    Assessment & Plan:   There are no diagnoses linked to this encounter. I am having Ms. Vercher maintain her cyclobenzaprine, vitamin C, Cholecalciferol, vitamin B-12, ALPRAZolam, naproxen, PNV PRENATAL PLUS MULTIVITAMIN, Olmesartan-Amlodipine-HCTZ, ferrous sulfate, Clocortolone Pivalate Pump, Azelaic Acid, SAXENDA, montelukast, loratadine, and Bepotastine Besilate.  No orders of the defined types were placed in this encounter.     Follow-up: No Follow-up on file.  Walker Kehr, MD

## 2016-01-02 NOTE — Progress Notes (Signed)
Pre visit review using our clinic review tool, if applicable. No additional management support is needed unless otherwise documented below in the visit note. 

## 2016-01-02 NOTE — Assessment & Plan Note (Addendum)
5/17 B -- [facility] X ray Neck brace - soft Tramadol prn  Potential benefits of a short term opioids use as well as potential risks (i.e. addiction risk, apnea etc) and complications (i.e. Somnolence, constipation and others) were explained to the patient and were aknowledged.

## 2016-01-03 ENCOUNTER — Telehealth: Payer: Self-pay | Admitting: Internal Medicine

## 2016-01-03 ENCOUNTER — Ambulatory Visit: Payer: 59 | Admitting: Internal Medicine

## 2016-01-03 NOTE — Telephone Encounter (Signed)
   Entered by Cassandria Anger, MD at 01/02/2016 9:26 PM    Dear Janett Billow,  Your cervical X ray shows mild arthritis. No bone fractures. Plan - as we discussed.  Sincerely,  Walker Kehr, MD

## 2016-01-03 NOTE — Telephone Encounter (Signed)
Patient called about xray tresults. Advised of dr plot's notes. She is asking why she still cannot turn her head. She is asking that you give her a call back

## 2016-01-03 NOTE — Telephone Encounter (Signed)
Left detailed mess informing pt of below.  

## 2016-01-29 ENCOUNTER — Encounter: Payer: Self-pay | Admitting: Allergy and Immunology

## 2016-01-29 ENCOUNTER — Ambulatory Visit (INDEPENDENT_AMBULATORY_CARE_PROVIDER_SITE_OTHER): Payer: 59 | Admitting: Allergy and Immunology

## 2016-01-29 VITALS — BP 118/74 | HR 80 | Resp 20

## 2016-01-29 DIAGNOSIS — J309 Allergic rhinitis, unspecified: Secondary | ICD-10-CM | POA: Diagnosis not present

## 2016-01-29 DIAGNOSIS — H1010 Acute atopic conjunctivitis, unspecified eye: Secondary | ICD-10-CM

## 2016-01-29 NOTE — Progress Notes (Signed)
Follow-up Note  Referring Provider: Cassandria Anger, MD Primary Provider: Walker Kehr, MD Date of Office Visit: 01/29/2016  Subjective:   [name] (DOB: 07-24-1971) is a 45 y.o. female who [facility] to [facility] 01[facility]2016 in re-evaluation of the following:  HPI: [name] returns to this clinic in reevaluation of her problems revolving around allergic disease. Utilizing medical therapy established 01/02/2016 she has had improvement regarding her eyes and nose. She has less nasal congestion and sneezing and her eyes are not as itchy. She has performed allergen avoidance measures to some degree against house dust mite and she is washing her dog weekly. However, she still does have issues with her nose and eyes and does not feel so she's 100% better regarding her atopic disease even in the face of utilizing medications.  When I saw her in his clinic in May there was the question of possible sleep apnea as she did have fractured sleep and she was somewhat tired during the daytime. She thinks that this is a little bit better. When she does get an opportunity to sleep she sleeping much better. She uses [name] prior to sleep. She still drinks about one cup of coffee per day. 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 Fortunately, next Week she'll only be working first and second shift and not third shift.    Medication List           ALPRAZolam 0.5 MG tablet  Commonly known as:  [name]  Take 0.5 mg by mouth at bedtime.     Bepotastine Besilate 1.5 % Soln  Commonly known as:  BEPREVE  USE 1 DROP IN EACH EYE TWICE DAILY AS NEEDED FOR ITCHY EYES     Clocortolone Pivalate Pump 0.1 % Crea  as needed.     cyclobenzaprine 10 MG tablet  Commonly known as:  FLEXERIL  Take 10-20 mg by mouth at bedtime. Take 1-2 qhs     EQL VITAMIN D3 1000 units tablet  Generic drug:  Cholecalciferol   Take 1,000 Units by mouth daily.     ferrous sulfate 325 (65 FE) MG tablet  Take 1 tablet (325 mg total) by mouth 2 (two) times daily.     FINACEA 15 % Foam  Generic drug:  Azelaic Acid  as needed.     loratadine 10 MG tablet  Commonly known as:  CLARITIN  Take 10 mg by mouth daily.     montelukast 10 MG tablet  Commonly known as:  SINGULAIR  Take 10 mg by mouth at bedtime.     naproxen 500 MG tablet  Commonly known as:  NAPROSYN  Take 1 tablet (500 mg total) by mouth 2 (two) times daily with a meal. Use prn     Olmesartan-Amlodipine-HCTZ 40-10-25 MG Tabs  Commonly known as:  TRIBENZOR  Take 1 tablet by mouth daily.     PNV PRENATAL PLUS MULTIVITAMIN 27-1 MG Tabs  Take 1 tablet by mouth daily.     SAXENDA 18 MG/3ML Sopn  Generic drug:  Liraglutide -Weight Management  Inject 5 mLs into the skin daily.     traMADol 50 MG tablet  Commonly known as:  ULTRAM  Take 1-2 tablets (50-100 mg total) by mouth 2 (two) times daily as needed for severe pain.     vitamin B-12 1000 MCG tablet  Commonly known as:  CYANOCOBALAMIN  Take 1  tablet (1,000 mcg total) by mouth every other day.     vitamin C 100 MG tablet  Take 200 mg by mouth 2 (two) times daily.        Past Medical History  Diagnosis Date  . Anemia     iron deficiency  . Leiomyoma of uterus, unspecified   . Unspecified vitamin D deficiency   . Nausea alone   . Iron deficiency anemia, unspecified   . Other B-complex deficiencies   . Anemia, unspecified   . Leiomyoma of uterus, unspecified   . Allergy     rhinitis  . Hypertension   . Obesity   . Asthma     asthma  . Meralgia paresthetica   . Umbilical hernia   . Colitis     Past Surgical History  Procedure Laterality Date  . Myomectomy      x 2  . Fibriodidectomy  2009    No Known Allergies  Review of systems negative except as noted in HPI / PMHx or noted below:  Review of Systems  Constitutional: Negative.   HENT: Negative.   Eyes:  Negative.   Respiratory: Negative.   Cardiovascular: Negative.   Gastrointestinal: Negative.   Genitourinary: Negative.   Musculoskeletal: Negative.   Skin: Negative.   Neurological: Negative.   Endo/Heme/Allergies: Negative.   Psychiatric/Behavioral: Negative.      Objective:   Filed Vitals:   01/29/16 1018  BP: 118/74  Pulse: 80  Resp: 20          Physical Exam  Constitutional: She is well-developed, well-nourished, and in no distress.  HENT:  Head: Normocephalic.  Right Ear: Tympanic membrane, external ear and ear canal normal.  Left Ear: Tympanic membrane, external ear and ear canal normal.  Nose: Nose normal. No mucosal edema or rhinorrhea.  Mouth/Throat: Uvula is midline, oropharynx is clear and moist and mucous membranes are normal. No oropharyngeal exudate.  Eyes: Conjunctivae are normal.  Neck: Trachea normal. No tracheal tenderness present. No tracheal deviation present. No thyromegaly present.  Cardiovascular: Normal rate, regular rhythm, S1 normal, S2 normal and normal heart sounds.   No murmur heard. Pulmonary/Chest: Breath sounds normal. No stridor. No respiratory distress. She has no wheezes. She has no rales.  Musculoskeletal: She exhibits no edema.  Lymphadenopathy:       Head (right side): No tonsillar adenopathy present.       Head (left side): No tonsillar adenopathy present.    She has no cervical adenopathy.  Neurological: She is alert. Gait normal.  Skin: No rash noted. She is not diaphoretic. No erythema. Nails show no clubbing.  Psychiatric: Mood and affect normal.    Diagnostics: None   Assessment and Plan:   1. Allergic rhinoconjunctivitis     1. Continue to perform Allergen avoidance measures  2. Continue to Treat and prevent inflammation:    A. OTC Rhinocort one spray each nostril one time per day. Coupon.  3. If needed:   A. nasal saline  B. Claritin 10 mg or Zyrtec 10 mg one time per day  C. Bepreve drop each eye twice a  day. Coupon  4. Consider immunotherapy if treatment ineffective   5. Consider sleep study if treatment ineffective   7. Return to clinic in 6 months or earlier if problem  8. Obtain fall flu vaccine   I think that [name] is doing somewhat better. This is probably a reflection of using medicines, performing allergen avoidance measures, and resolution of the springtime pollen season.  I did have a talk with her today about the role of immunotherapy and if she feels as though her treatment is ineffective she should consider starting this form of therapy. As well, her sleep is a little bit better at this point and we'll see how she does when she eliminate the use of third shift work starting next week. I told her to contact me should she have problems with fractured sleep or excessive daytime sleepiness as we may need to obtain a sleep study in investigation of possible sleep apnea. I'll see her back in this clinic in approximately 6 months or earlier if there is a problem.  Allena Katz, MD [place]

## 2016-01-29 NOTE — Patient Instructions (Signed)
  1. Continue to perform Allergen avoidance measures  2. Continue to Treat and prevent inflammation:    A. OTC Rhinocort one spray each nostril one time per day. Coupon.  3. If needed:   A. nasal saline  B. Claritin 10 mg or Zyrtec 10 mg one time per day  C. Bepreve drop each eye twice a day. Coupon  4. Consider immunotherapy if treatment ineffective   5. Consider sleep study if treatment ineffective   7. Return to clinic in 6 months or earlier if problem  8. Obtain fall flu vaccine

## 2016-02-08 ENCOUNTER — Other Ambulatory Visit: Payer: Self-pay | Admitting: Internal Medicine

## 2016-04-08 ENCOUNTER — Telehealth: Payer: Self-pay | Admitting: Allergy and Immunology

## 2016-04-08 NOTE — Telephone Encounter (Signed)
Left message that I would make a note that she is sending payment Friday & to make monthly pmts around the 8th of each mo

## 2016-04-08 NOTE — Telephone Encounter (Signed)
She called and said that she would make a payment this Friday and asks that we please not send it to collections. She would also like to speak with you about payment arrangements.

## 2016-04-21 ENCOUNTER — Other Ambulatory Visit (INDEPENDENT_AMBULATORY_CARE_PROVIDER_SITE_OTHER): Payer: 59

## 2016-04-21 ENCOUNTER — Encounter: Payer: Self-pay | Admitting: Internal Medicine

## 2016-04-21 ENCOUNTER — Ambulatory Visit (INDEPENDENT_AMBULATORY_CARE_PROVIDER_SITE_OTHER): Payer: 59 | Admitting: Internal Medicine

## 2016-04-21 DIAGNOSIS — R5382 Chronic fatigue, unspecified: Secondary | ICD-10-CM

## 2016-04-21 DIAGNOSIS — N921 Excessive and frequent menstruation with irregular cycle: Secondary | ICD-10-CM

## 2016-04-21 DIAGNOSIS — D509 Iron deficiency anemia, unspecified: Secondary | ICD-10-CM

## 2016-04-21 DIAGNOSIS — E559 Vitamin D deficiency, unspecified: Secondary | ICD-10-CM | POA: Diagnosis not present

## 2016-04-21 DIAGNOSIS — Z23 Encounter for immunization: Secondary | ICD-10-CM | POA: Diagnosis not present

## 2016-04-21 DIAGNOSIS — N920 Excessive and frequent menstruation with regular cycle: Secondary | ICD-10-CM | POA: Insufficient documentation

## 2016-04-21 LAB — CBC WITH DIFFERENTIAL/PLATELET
BASOS PCT: 0.3 % (ref 0.0–3.0)
Basophils Absolute: 0 10*3/uL (ref 0.0–0.1)
EOS ABS: 0.1 10*3/uL (ref 0.0–0.7)
Eosinophils Relative: 1.4 % (ref 0.0–5.0)
HCT: 36.5 % (ref 36.0–46.0)
HEMOGLOBIN: 12.3 g/dL (ref 12.0–15.0)
LYMPHS ABS: 1.5 10*3/uL (ref 0.7–4.0)
Lymphocytes Relative: 29.1 % (ref 12.0–46.0)
MCHC: 33.6 g/dL (ref 30.0–36.0)
MCV: 79.6 fl (ref 78.0–100.0)
MONO ABS: 0.4 10*3/uL (ref 0.1–1.0)
Monocytes Relative: 7 % (ref 3.0–12.0)
NEUTROS PCT: 62.2 % (ref 43.0–77.0)
Neutro Abs: 3.2 10*3/uL (ref 1.4–7.7)
PLATELETS: 174 10*3/uL (ref 150.0–400.0)
RBC: 4.59 Mil/uL (ref 3.87–5.11)
RDW: 15.9 % — AB (ref 11.5–15.5)
WBC: 5.1 10*3/uL (ref 4.0–10.5)

## 2016-04-21 LAB — LIPID PANEL
CHOLESTEROL: 180 mg/dL (ref 0–200)
HDL: 53.5 mg/dL (ref >39.00)
LDL CALC: 106 mg/dL — AB (ref 0–99)
NonHDL: 126.9
TRIGLYCERIDES: 104 mg/dL (ref 0.0–149.0)
Total CHOL/HDL Ratio: 3
VLDL: 20.8 mg/dL (ref 0.0–40.0)

## 2016-04-21 LAB — BASIC METABOLIC PANEL
BUN: 19 mg/dL (ref 6–23)
CHLORIDE: 103 meq/L (ref 96–112)
CO2: 28 mEq/L (ref 19–32)
CREATININE: 0.69 mg/dL (ref 0.40–1.20)
Calcium: 9.4 mg/dL (ref 8.4–10.5)
GFR: 117.69 mL/min (ref >60.00)
GLUCOSE: 91 mg/dL (ref 70–99)
Potassium: 3.5 mEq/L (ref 3.5–5.1)
Sodium: 139 mEq/L (ref 135–145)

## 2016-04-21 LAB — IBC PANEL
Iron: 42 ug/dL (ref 42–145)
SATURATION RATIOS: 11.3 % — AB (ref 20.0–50.0)
TRANSFERRIN: 265 mg/dL (ref 212.0–360.0)

## 2016-04-21 LAB — HEPATIC FUNCTION PANEL
ALBUMIN: 4.2 g/dL (ref 3.5–5.2)
ALT: 16 U/L (ref 0–35)
AST: 20 U/L (ref 0–37)
Alkaline Phosphatase: 46 U/L (ref 39–117)
BILIRUBIN TOTAL: 0.2 mg/dL (ref 0.2–1.2)
Bilirubin, Direct: 0 mg/dL (ref 0.0–0.3)
Total Protein: 7.6 g/dL (ref 6.0–8.3)

## 2016-04-21 LAB — HEMOGLOBIN A1C: HEMOGLOBIN A1C: 5.8 % (ref 4.6–6.5)

## 2016-04-21 LAB — SEDIMENTATION RATE: SED RATE: 35 mm/h — AB (ref 0–20)

## 2016-04-21 NOTE — Assessment & Plan Note (Addendum)
Labs w/FSH

## 2016-04-21 NOTE — Progress Notes (Signed)
Subjective:  Patient ID: [name], female    DOB: 03-Jul-1971  Age: 45 y.o. MRN: [id number]  CC: Fatigue and Menstrual Problem (changes in cycle and flow)   HPI [name] presents for a fatigue x 1 mo. LMP now. Periods have been heavy. C/o stress w/sick aunt in [place]. Lacking sleep - 5 h /night. Per mom: snoring  Outpatient Medications Prior to Visit  Medication Sig Dispense Refill  . ALPRAZolam (XANAX) 0.5 MG tablet Take 0.5 mg by mouth at bedtime.    . Ascorbic Acid (VITAMIN C) 100 MG tablet Take 200 mg by mouth 2 (two) times daily.      . Azelaic Acid (FINACEA) 15 % FOAM as needed.    . Bepotastine Besilate (BEPREVE) 1.5 % SOLN USE 1 DROP IN EACH EYE TWICE DAILY AS NEEDED FOR ITCHY EYES 10 mL 5  . Cholecalciferol (EQL VITAMIN D3) 1000 UNITS tablet Take 1,000 Units by mouth daily.      . Clocortolone Pivalate Pump 0.1 % CREA as needed.    . cyclobenzaprine (FLEXERIL) 10 MG tablet Take 10-20 mg by mouth at bedtime. Take 1-2 qhs    . ferrous sulfate 325 (65 FE) MG tablet Take 1 tablet (325 mg total) by mouth 2 (two) times daily. 180 tablet 3  . naproxen (NAPROSYN) 500 MG tablet Take 1 tablet (500 mg total) by mouth 2 (two) times daily with a meal. Use prn 60 tablet 3  . Olmesartan-Amlodipine-HCTZ 40-10-25 MG TABS TAKE 1 TABLET BY MOUTH DAILY. 30 tablet 5  . Prenatal Vit-Fe Fumarate-FA (PNV PRENATAL PLUS MULTIVITAMIN) 27-1 MG TABS Take 1 tablet by mouth daily. 90 tablet 3  . traMADol (ULTRAM) 50 MG tablet Take 1-2 tablets (50-100 mg total) by mouth 2 (two) times daily as needed for severe pain. 60 tablet 0  . vitamin B-12 (CYANOCOBALAMIN) 1000 MCG tablet Take 1 tablet (1,000 mcg total) by mouth every other day. 100 tablet 3  . loratadine (CLARITIN) 10 MG tablet Take 10 mg by mouth daily.    . montelukast (SINGULAIR) 10 MG tablet Take 10 mg by mouth at bedtime.    Marland Kitchen SAXENDA 18 MG/3ML SOPN Inject 5 mLs into the skin daily.  1   No facility-administered medications prior to visit.      ROS Review of Systems  Constitutional: Positive for fatigue. Negative for activity change, appetite change, chills and unexpected weight change.  HENT: Negative for congestion, mouth sores and sinus pressure.   Eyes: Negative for visual disturbance.  Respiratory: Negative for cough and chest tightness.   Gastrointestinal: Negative for abdominal pain and nausea.  Genitourinary: Negative for difficulty urinating, frequency and vaginal pain.  Musculoskeletal: Positive for arthralgias. Negative for back pain and gait problem.  Skin: Negative for pallor and rash.  Neurological: Negative for dizziness, tremors, weakness, numbness and headaches.  Psychiatric/Behavioral: Negative for confusion and sleep disturbance.    Objective:  BP 104/68   Pulse 76   Wt 213 lb (96.6 kg)   SpO2 99%   BMI 38.96 kg/m   BP Readings from Last 3 Encounters:  04/21/16 104/68  01/29/16 118/74  01/02/16 (!) 90/58    Wt Readings from Last 3 Encounters:  04/21/16 213 lb (96.6 kg)  01/02/16 213 lb (96.6 kg)  01/01/16 212 lb (96.2 kg)    Physical Exam  Constitutional: She appears well-developed. No distress.  HENT:  Head: Normocephalic.  Right Ear: External ear normal.  Left Ear: External ear normal.  Nose: Nose normal.  Mouth/Throat: Oropharynx is clear and moist.  Eyes: Conjunctivae are normal. Pupils are equal, round, and reactive to light. Right eye exhibits no discharge. Left eye exhibits no discharge.  Neck: Normal range of motion. Neck supple. No JVD present. No tracheal deviation present. No thyromegaly present.  Cardiovascular: Normal rate, regular rhythm and normal heart sounds.   Pulmonary/Chest: No stridor. No respiratory distress. She has no wheezes.  Abdominal: Soft. Bowel sounds are normal. She exhibits no distension and no mass. There is no tenderness. There is no rebound and no guarding.  Musculoskeletal: She exhibits no edema or tenderness.  Lymphadenopathy:    She has no  cervical adenopathy.  Neurological: She displays normal reflexes. No cranial nerve deficit. She exhibits normal muscle tone. Coordination normal.  Skin: No rash noted. No erythema.  Psychiatric: She has a normal mood and affect. Her behavior is normal. Judgment and thought content normal.  Obese  Lab Results  Component Value Date   WBC 5.8 10/10/2014   HGB 12.5 10/10/2014   HCT 36.9 10/10/2014   PLT 186.0 10/10/2014   GLUCOSE 91 10/10/2014   CHOL 180 07/06/2012   TRIG 76.0 07/06/2012   HDL 72.50 07/06/2012   LDLCALC 92 07/06/2012   ALT 21 10/10/2014   AST 25 10/10/2014   NA 136 10/10/2014   K 3.7 10/10/2014   CL 98 10/10/2014   CREATININE 0.69 10/10/2014   BUN 14 10/10/2014   CO2 33 (H) 10/10/2014   TSH 0.51 10/10/2014   INR 0.9 RATIO 03/15/2007   HGBA1C 5.7 10/10/2014    Dg Cervical Spine 2 Or 3 Views  Result Date: 01/02/2016 CLINICAL DATA:  Neck pain after fall 5 days ago. EXAM: CERVICAL SPINE - 2-3 VIEW COMPARISON:  None. FINDINGS: No fracture or spondylolisthesis is noted. Disc spaces are well-maintained. Mild anterior osteophyte formation is noted at C5-6. Posterior facet joints appear intact. IMPRESSION: Mild anterior osteophyte formation is noted at C5-6. No other abnormality seen in the cervical spine. Electronically Signed   By: Marijo Conception, M.D.   On: 01/02/2016 15:30    Assessment & Plan:   There are no diagnoses linked to this encounter. I am having [name] maintain her cyclobenzaprine, vitamin C, Cholecalciferol, vitamin B-12, ALPRAZolam, naproxen, PNV PRENATAL PLUS MULTIVITAMIN, ferrous sulfate, Clocortolone Pivalate Pump, Azelaic Acid, SAXENDA, montelukast, loratadine, Bepotastine Besilate, traMADol, Olmesartan-Amlodipine-HCTZ, QSYMIA, and cetirizine.  Meds ordered this encounter  Medications  . QSYMIA 7.5-46 MG CP24    Sig: Take 1 capsule by mouth daily.    Refill:  1  . cetirizine (ZYRTEC) 10 MG tablet    Sig: Take 10 mg by mouth daily.      Follow-up: No Follow-up on file.  Walker Kehr, MD

## 2016-04-21 NOTE — Progress Notes (Signed)
Pre visit review using our clinic review tool, if applicable. No additional management support is needed unless otherwise documented below in the visit note. 

## 2016-04-21 NOTE — Assessment & Plan Note (Signed)
Labs

## 2016-04-21 NOTE — Assessment & Plan Note (Signed)
Labs On B12 

## 2016-04-21 NOTE — Assessment & Plan Note (Addendum)
?  etiology:  Periods have been heavy. C/o stress w/sick aunt in [place]. Lacking sleep - 5 h /night. Per mom: snoring Get labs Get more sleep. R/o OSA need was discussed

## 2016-04-22 LAB — VITAMIN B12: Vitamin B-12: 1298 pg/mL — ABNORMAL HIGH (ref 211–911)

## 2016-04-22 LAB — TSH: TSH: 0.55 u[IU]/mL (ref 0.35–4.50)

## 2016-04-22 LAB — FOLLICLE STIMULATING HORMONE: FSH: 10 m[IU]/mL

## 2016-04-22 LAB — VITAMIN D 25 HYDROXY (VIT D DEFICIENCY, FRACTURES): VITD: 39 ng/mL (ref 30.00–100.00)

## 2016-06-30 ENCOUNTER — Ambulatory Visit: Payer: 59

## 2016-07-02 ENCOUNTER — Ambulatory Visit (INDEPENDENT_AMBULATORY_CARE_PROVIDER_SITE_OTHER): Payer: 59

## 2016-07-02 DIAGNOSIS — Z23 Encounter for immunization: Secondary | ICD-10-CM

## 2016-07-04 LAB — TB SKIN TEST
Induration: 0 mm
TB Skin Test: NEGATIVE

## 2016-08-19 DIAGNOSIS — Z0100 Encounter for examination of eyes and vision without abnormal findings: Secondary | ICD-10-CM | POA: Diagnosis not present

## 2016-08-25 ENCOUNTER — Other Ambulatory Visit: Payer: Self-pay | Admitting: Internal Medicine

## 2016-09-25 ENCOUNTER — Other Ambulatory Visit: Payer: Self-pay | Admitting: Obstetrics and Gynecology

## 2016-09-25 DIAGNOSIS — Z124 Encounter for screening for malignant neoplasm of cervix: Secondary | ICD-10-CM | POA: Diagnosis not present

## 2016-09-25 DIAGNOSIS — Z01419 Encounter for gynecological examination (general) (routine) without abnormal findings: Secondary | ICD-10-CM | POA: Diagnosis not present

## 2016-09-26 LAB — CYTOLOGY - PAP

## 2016-10-06 ENCOUNTER — Other Ambulatory Visit (INDEPENDENT_AMBULATORY_CARE_PROVIDER_SITE_OTHER): Payer: 59

## 2016-10-06 ENCOUNTER — Ambulatory Visit (INDEPENDENT_AMBULATORY_CARE_PROVIDER_SITE_OTHER): Payer: 59 | Admitting: Family

## 2016-10-06 ENCOUNTER — Encounter: Payer: Self-pay | Admitting: Family

## 2016-10-06 DIAGNOSIS — R109 Unspecified abdominal pain: Secondary | ICD-10-CM | POA: Insufficient documentation

## 2016-10-06 DIAGNOSIS — R1084 Generalized abdominal pain: Secondary | ICD-10-CM | POA: Diagnosis not present

## 2016-10-06 LAB — COMPREHENSIVE METABOLIC PANEL
ALBUMIN: 4.2 g/dL (ref 3.5–5.2)
ALK PHOS: 55 U/L (ref 39–117)
ALT: 14 U/L (ref 0–35)
AST: 17 U/L (ref 0–37)
BUN: 13 mg/dL (ref 6–23)
CHLORIDE: 103 meq/L (ref 96–112)
CO2: 24 mEq/L (ref 19–32)
CREATININE: 0.61 mg/dL (ref 0.40–1.20)
Calcium: 9.2 mg/dL (ref 8.4–10.5)
GFR: 135.41 mL/min (ref >60.00)
Glucose, Bld: 93 mg/dL (ref 70–99)
Potassium: 3.3 mEq/L — ABNORMAL LOW (ref 3.5–5.1)
SODIUM: 137 meq/L (ref 135–145)
TOTAL PROTEIN: 7.8 g/dL (ref 6.0–8.3)
Total Bilirubin: 0.3 mg/dL (ref 0.2–1.2)

## 2016-10-06 LAB — LIPASE: Lipase: 68 U/L — ABNORMAL HIGH (ref 11.0–59.0)

## 2016-10-06 LAB — AMYLASE: AMYLASE: 67 U/L (ref 27–131)

## 2016-10-06 NOTE — Progress Notes (Signed)
Subjective:    Patient ID: [name], female    DOB: 04/18/1971, 46 y.o.   MRN: [id number]  Chief Complaint  Patient presents with  . Stomach issues    has been having abdominal pain, feeling sick on stomach states she has been feeling this way for over a week but has been getting worse    HPI:  [name] is a 46 y.o. female who  has a past medical history of Allergy; Anemia; Anemia, unspecified; Asthma; Colitis; Hypertension; Iron deficiency anemia, unspecified; Leiomyoma of uterus, unspecified; Leiomyoma of uterus, unspecified; Meralgia paresthetica; Nausea alone; Obesity; Other B-complex deficiencies; Umbilical hernia; and Unspecified vitamin D deficiency. and presents today for an acute office visit.  This is a new problem. Associated symptoms of "feeling sick on her stomach" has been going on for about 1 week and has worsened since initial onset. Denies any fevers. Aggravated with eating. Described as achy that does improve further away from meals. Indicates that she has been constipated. Last bowel movement was within the last 24 hours and is generally goes daily. No nutritional changes. No fevers. Modifying factors include Pepto-Bismol which did not help very much. Describes a previous history of colitis. Does take Naproxen as needed for discomfort but not on a consistent or daily basis.   No Known Allergies    Outpatient Medications Prior to Visit  Medication Sig Dispense Refill  . ALPRAZolam (XANAX) 0.5 MG tablet Take 0.5 mg by mouth at bedtime.    . Ascorbic Acid (VITAMIN C) 100 MG tablet Take 200 mg by mouth 2 (two) times daily.      . Azelaic Acid (FINACEA) 15 % FOAM as needed.    . Bepotastine Besilate (BEPREVE) 1.5 % SOLN USE 1 DROP IN EACH EYE TWICE DAILY AS NEEDED FOR ITCHY EYES 10 mL 5  . cetirizine (ZYRTEC) 10 MG tablet Take 10 mg by mouth daily.    . Cholecalciferol (EQL VITAMIN D3) 1000 UNITS tablet Take 1,000 Units by mouth daily.      . Clocortolone  Pivalate Pump 0.1 % CREA as needed.    . cyclobenzaprine (FLEXERIL) 10 MG tablet Take 10-20 mg by mouth at bedtime. Take 1-2 qhs    . ferrous sulfate 325 (65 FE) MG tablet Take 1 tablet (325 mg total) by mouth 2 (two) times daily. 180 tablet 3  . loratadine (CLARITIN) 10 MG tablet Take 10 mg by mouth daily.    . montelukast (SINGULAIR) 10 MG tablet Take 10 mg by mouth at bedtime.    . naproxen (NAPROSYN) 500 MG tablet Take 1 tablet (500 mg total) by mouth 2 (two) times daily with a meal. Use prn 60 tablet 3  . Olmesartan-Amlodipine-HCTZ 40-10-25 MG TABS TAKE 1 TABLET BY MOUTH DAILY. 30 tablet 5  . Prenatal Vit-Fe Fumarate-FA (PNV PRENATAL PLUS MULTIVITAMIN) 27-1 MG TABS Take 1 tablet by mouth daily. 90 tablet 3  . QSYMIA 7.5-46 MG CP24 Take 1 capsule by mouth daily.  1  . traMADol (ULTRAM) 50 MG tablet Take 1-2 tablets (50-100 mg total) by mouth 2 (two) times daily as needed for severe pain. 60 tablet 0  . vitamin B-12 (CYANOCOBALAMIN) 1000 MCG tablet Take 1 tablet (1,000 mcg total) by mouth every other day. 100 tablet 3   No facility-administered medications prior to visit.       Past Surgical History:  Procedure Laterality Date  . fibriodidectomy  2009  . MYOMECTOMY     x 2  Past Medical History:  Diagnosis Date  . Allergy    rhinitis  . Anemia    iron deficiency  . Anemia, unspecified   . Asthma    asthma  . Colitis   . Hypertension   . Iron deficiency anemia, unspecified   . Leiomyoma of uterus, unspecified   . Leiomyoma of uterus, unspecified   . Meralgia paresthetica   . Nausea alone   . Obesity   . Other B-complex deficiencies   . Umbilical hernia   . Unspecified vitamin D deficiency     Review of Systems  Constitutional: Negative for chills and fever.  Gastrointestinal: Positive for abdominal distention and abdominal pain. Negative for anal bleeding, blood in stool, constipation, diarrhea, nausea, rectal pain and vomiting.      Objective:    BP  126/88 (BP Location: Left Arm, Patient Position: Sitting, Cuff Size: Large)   Pulse 80   Temp 98.5 F (36.9 C) (Oral)   Resp 16   Ht _0  (1.575 m)   Wt 213 lb 12.8 oz (97 kg)   SpO2 98%   BMI 39.10 kg/m  Nursing note and vital signs reviewed.  Physical Exam  Constitutional: She is oriented to person, place, and time. She appears well-developed and well-nourished. No distress.  Cardiovascular: Normal rate, regular rhythm, normal heart sounds and intact distal pulses.   Pulmonary/Chest: Effort normal and breath sounds normal.  Abdominal: Normal appearance and bowel sounds are normal. There is generalized tenderness. There is no rigidity, no rebound, no guarding, no CVA tenderness, no tenderness at McBurney's point and negative Murphy's sign.  Neurological: She is alert and oriented to person, place, and time.  Skin: Skin is warm and dry.  Psychiatric: She has a normal mood and affect. Her behavior is normal. Judgment and thought content normal.       Assessment & Plan:   Problem List Items Addressed This Visit      Other   Abdominal pain    Generalized abdominal pain with no obvious origin with concern for possible constipation or reflux. Recommend starting proton pump inhibitor and bowel program. Abdominal exam with no significant findings. Obtain CMET, amylase and lipase. Consider imaging if symptoms worsen or do not improve with conservative treatment and pending blood work.       Relevant Orders   Comp Met (CMET) (Completed)   Amylase (Completed)   Lipase (Completed)       I am having Ms. Tisdel maintain her cyclobenzaprine, vitamin C, Cholecalciferol, vitamin B-12, ALPRAZolam, naproxen, PNV PRENATAL PLUS MULTIVITAMIN, ferrous sulfate, Clocortolone Pivalate Pump, Azelaic Acid, montelukast, loratadine, Bepotastine Besilate, traMADol, QSYMIA, cetirizine, and Olmesartan-Amlodipine-HCTZ.   Follow-up: Return if symptoms worsen or fail to improve.  Mauricio Po,  FNP

## 2016-10-06 NOTE — Assessment & Plan Note (Signed)
Generalized abdominal pain with no obvious origin with concern for possible constipation or reflux. Recommend starting proton pump inhibitor and bowel program. Abdominal exam with no significant findings. Obtain CMET, amylase and lipase. Consider imaging if symptoms worsen or do not improve with conservative treatment and pending blood work.

## 2016-10-06 NOTE — Patient Instructions (Addendum)
Thank you for choosing [facility].  SUMMARY AND INSTRUCTIONS:  Recommend starting a proton pump inhibitor of your choice: omeprazole (Priolsec), Esomeprazole (Nexium), or Lansoprazole (Prevacid)  Recommend a bowel regimen to ensure constipation is not a contributing factor: Start with a stool softener such as Colace (docusate sodium) or magneisum citrate or Miralax.   Also consider a probiotic.  Keep your appointment with gastroenterology if you symptoms do not improve.   Labs:  Please stop by the lab on the lower level of the building for your blood work. Your results will be released to [facility] (or called to you) after review, usually within 72 hours after test completion. If any changes need to be made, you will be notified at that same time.  1.) The lab is open from 7:30am to 5:30 pm Monday-Friday 2.) No appointment is necessary 3.) Fasting (if needed) is 6-8 hours after food and drink; black coffee and water are okay   Follow up:  If your symptoms worsen or fail to improve, please contact our office for further instruction, or in case of emergency go directly to the emergency room at the closest medical facility.     Food Choices for Gastroesophageal Reflux Disease, Adult When you have gastroesophageal reflux disease (GERD), the foods you eat and your eating habits are very important. Choosing the right foods can help ease your discomfort. What guidelines do I need to follow?  Choose fruits, vegetables, whole grains, and low-fat dairy products.  Choose low-fat meat, fish, and poultry.  Limit fats such as oils, salad dressings, butter, nuts, and avocado.  Keep a food diary. This helps you identify foods that cause symptoms.  Avoid foods that cause symptoms. These may be different for everyone.  Eat small meals often instead of 3 large meals a day.  Eat your meals slowly, in a place where you are relaxed.  Limit fried foods.  Cook foods using methods other  than frying.  Avoid drinking alcohol.  Avoid drinking large amounts of liquids with your meals.  Avoid bending over or lying down until 2-3 hours after eating. What foods are not recommended? These are some foods and drinks that may make your symptoms worse: Vegetables  Tomatoes. Tomato juice. Tomato and spaghetti sauce. Chili peppers. Onion and garlic. Horseradish. Fruits  Oranges, grapefruit, and lemon (fruit and juice). Meats  High-fat meats, fish, and poultry. This includes hot dogs, ribs, ham, sausage, salami, and bacon. Dairy  Whole milk and chocolate milk. Sour cream. Cream. Butter. Ice cream. Cream cheese. Drinks  Coffee and tea. Bubbly (carbonated) drinks or energy drinks. Condiments  Hot sauce. Barbecue sauce. Sweets/Desserts  Chocolate and cocoa. Donuts. Peppermint and spearmint. Fats and Oils  High-fat foods. This includes [place] fries and potato chips. Other  Vinegar. Strong spices. This includes black pepper, white pepper, red pepper, cayenne, curry powder, cloves, ginger, and chili powder. The items listed above may not be a complete list of foods and drinks to avoid. Contact your dietitian for more information.  This information is not intended to replace advice given to you by your health care provider. Make sure you discuss any questions you have with your health care provider. Document Released: 01/20/2012 Document Revised: 12/27/2015 Document Reviewed: 05/25/2013 Elsevier Interactive Patient Education  2017 [employer].   Constipation, Adult Constipation is when a person has fewer bowel movements in a week than normal, has difficulty having a bowel movement, or has stools that are dry, hard, or larger than normal. Constipation may be  caused by an underlying condition. It may become worse with age if a person takes certain medicines and does not take in enough fluids. Follow these instructions at home: Eating and drinking    Eat foods that have a lot of  fiber, such as fresh fruits and vegetables, whole grains, and beans.  Limit foods that are high in fat, low in fiber, or overly processed, such as french fries, hamburgers, cookies, candies, and soda.  Drink enough fluid to keep your urine clear or pale yellow. General instructions   Exercise regularly or as told by your health care provider.  Go to the restroom when you have the urge to go. Do not hold it in.  Take over-the-counter and prescription medicines only as told by your health care provider. These include any fiber supplements.  Practice pelvic floor retraining exercises, such as deep breathing while relaxing the lower abdomen and pelvic floor relaxation during bowel movements.  Watch your condition for any changes.  Keep all follow-up visits as told by your health care provider. This is important. Contact a health care provider if:  You have pain that gets worse.  You have a fever.  You do not have a bowel movement after 4 days.  You vomit.  You are not hungry.  You lose weight.  You are bleeding from the anus.  You have thin, pencil-like stools. Get help right away if:  You have a fever and your symptoms suddenly get worse.  You leak stool or have blood in your stool.  Your abdomen is bloated.  You have severe pain in your abdomen.  You feel dizzy or you faint. This information is not intended to replace advice given to you by your health care provider. Make sure you discuss any questions you have with your health care provider. Document Released: 04/18/2004 Document Revised: 02/08/2016 Document Reviewed: 01/09/2016 Elsevier Interactive Patient Education  2017 [employer].

## 2016-10-07 ENCOUNTER — Encounter: Payer: Self-pay | Admitting: Family

## 2016-10-11 DIAGNOSIS — K30 Functional dyspepsia: Secondary | ICD-10-CM | POA: Diagnosis not present

## 2016-10-15 ENCOUNTER — Other Ambulatory Visit (INDEPENDENT_AMBULATORY_CARE_PROVIDER_SITE_OTHER): Payer: 59

## 2016-10-15 ENCOUNTER — Encounter: Payer: Self-pay | Admitting: Nurse Practitioner

## 2016-10-15 ENCOUNTER — Ambulatory Visit (INDEPENDENT_AMBULATORY_CARE_PROVIDER_SITE_OTHER): Payer: 59 | Admitting: Nurse Practitioner

## 2016-10-15 VITALS — BP 118/70 | HR 90 | Ht 62.0 in | Wt 214.0 lb

## 2016-10-15 DIAGNOSIS — R140 Abdominal distension (gaseous): Secondary | ICD-10-CM

## 2016-10-15 DIAGNOSIS — R438 Other disturbances of smell and taste: Secondary | ICD-10-CM

## 2016-10-15 DIAGNOSIS — R110 Nausea: Secondary | ICD-10-CM

## 2016-10-15 MED ORDER — ONDANSETRON HCL 4 MG PO TABS: 4.0000 mg | ORAL_TABLET | Freq: Three times a day (TID) | ORAL | 0 refills | Status: DC | PRN

## 2016-10-15 MED ORDER — PANTOPRAZOLE SODIUM 40 MG PO TBEC: 40.0000 mg | DELAYED_RELEASE_TABLET | Freq: Every day | ORAL | 0 refills | Status: DC

## 2016-10-15 NOTE — Patient Instructions (Addendum)
You have been scheduled for an endoscopy. Please follow written instructions given to you at your visit today. If you use inhalers (even only as needed), please bring them with you on the day of your procedure. Your physician has requested that you go to www.startemmi.com and enter the access code given to you at your visit today. This web site gives a general overview about your procedure. However, you should still follow specific instructions given to you by our office regarding your preparation for the procedure.  Continue Protonix daily.  Use Zofran as needed.  PLEASE HOLD THE OSYMIA BEFORE THE EGD.   If you are age 55 or older, your body mass index should be between 23-30. Your Body mass index is 39.14 kg/m. If this is out of the aforementioned range listed, please consider follow up with your Primary Care Provider.  If you are age 45 or younger, your body mass index should be between 19-25. Your Body mass index is 39.14 kg/m. If this is out of the aformentioned range listed, please consider follow up with your Primary Care Provider.   Thank you for choosing [facility]

## 2016-10-15 NOTE — Progress Notes (Addendum)
ADDENDUM:  Researched home med Qsymia for weight loss. Both constipation and dysgeusia listed as some of most common side effects. I called patient, we are cancelling EGD. She can stop PPI. She will discuss discontinuation of Qsymia with prescribing provider. Patient will call and let me know if symptoms resolve after stopping medication. If still having problems then we will continue GI workup.          HPI: Patient is a 46 year old female evaluated by Dr. Delfin Edis back in 2014 for colitis felt to be infectious. She did undergo colonoscopy which revealed complete resolution of the colitis. Patient is here today for evaluation of a bad taste in her mouth and "feeling sick on stomach". Symptoms started a couple of weeks ago with sharp pain in left mid abdomen. Pain lasted for a couple of days then subsided but she has been left with some queasiness and bad taste in mouth. Her bowel movements have been a little sluggish later but she is having them. No vomiting. She was seen at urgent care 10/06/16 for the problem, started on a PPI. Labs on that day revealed a potassium of 3.3, lipase of 68, labs otherwise unremarkable. She saw her dentist but no dental problems detected. She doesn't takes any herbs or vitamin. Takes Qsymia for weight loss   Past Medical History:  Diagnosis Date  . Allergy    rhinitis  . Anemia    iron deficiency  . Anemia, unspecified   . Asthma    asthma  . Colitis   . Hypertension   . Iron deficiency anemia, unspecified   . Leiomyoma of uterus, unspecified   . Leiomyoma of uterus, unspecified   . Meralgia paresthetica   . Nausea alone   . Obesity   . Other B-complex deficiencies   . Umbilical hernia   . Unspecified vitamin D deficiency     Past Surgical History:  Procedure Laterality Date  . fibriodidectomy  2009  . MYOMECTOMY     x 2   Family History  Problem Relation Age of Onset  . Hypertension Other    Social History  Substance Use Topics  .  Smoking status: Never Smoker  . Smokeless tobacco: Never Used  . Alcohol use No   Current Outpatient Prescriptions  Medication Sig Dispense Refill  . ALPRAZolam (XANAX) 0.5 MG tablet Take 0.5 mg by mouth at bedtime.    . Ascorbic Acid (VITAMIN C) 100 MG tablet Take 200 mg by mouth 2 (two) times daily.      . Azelaic Acid (FINACEA) 15 % FOAM as needed.    . Bepotastine Besilate (BEPREVE) 1.5 % SOLN USE 1 DROP IN EACH EYE TWICE DAILY AS NEEDED FOR ITCHY EYES 10 mL 5  . cetirizine (ZYRTEC) 10 MG tablet Take 10 mg by mouth daily.    . Cholecalciferol (EQL VITAMIN D3) 1000 UNITS tablet Take 1,000 Units by mouth daily.      . Clocortolone Pivalate Pump 0.1 % CREA as needed.    . cyclobenzaprine (FLEXERIL) 10 MG tablet Take 10-20 mg by mouth at bedtime. Take 1-2 qhs    . ferrous sulfate 325 (65 FE) MG tablet Take 1 tablet (325 mg total) by mouth 2 (two) times daily. 180 tablet 3  . naproxen (NAPROSYN) 500 MG tablet Take 1 tablet (500 mg total) by mouth 2 (two) times daily with a meal. Use prn 60 tablet 3  . Olmesartan-Amlodipine-HCTZ 40-10-25 MG TABS TAKE 1 TABLET BY MOUTH DAILY. 30 tablet 5  .  QSYMIA 7.5-46 MG CP24 Take 1 capsule by mouth daily.  1  . traMADol (ULTRAM) 50 MG tablet Take 1-2 tablets (50-100 mg total) by mouth 2 (two) times daily as needed for severe pain. 60 tablet 0  . montelukast (SINGULAIR) 10 MG tablet Take 10 mg by mouth at bedtime.     No current facility-administered medications for this visit.    No Known Allergies   Review of Systems: All systems reviewed and negative except where noted in HPI.   Physical Exam: BP 118/70   Pulse 90   Ht 5\' 2"  (1.575 m)   Wt 214 lb (97.1 kg)   SpO2 99%   BMI 39.14 kg/m  Constitutional:  Well-developed, black female in no acute distress. Psychiatric: Normal mood and affect. Behavior is normal. EENT: . Conjunctivae are normal. No scleral icterus. Neck supple.  Cardiovascular: Normal rate, regular rhythm.  Pulmonary/chest:  Effort normal and breath sounds normal. No wheezing, rales or rhonchi. Abdominal: Soft, nondistended, nontender. Bowel sounds active throughout. There are no masses palpable. No hepatomegaly. Extremities: no edema Lymphadenopathy: No cervical adenopathy noted. Neurological: Alert and oriented to person place and time. Skin: Skin is warm and dry. No rashes noted.   ASSESSMENT AND PLAN:  13. 46 year old female with bad metallic taste in mouth and some mild queasiness. [facility]. Renal function, liver studies okay. No GERD symptoms. Started on a PPI but hasn't noticed much improvement.  -Perplexing case. She appears very health. No dental issues or obvious GERD. She is distressed about this bad taste in her mouth. I am going to schedule her for an EGD given that she is also having some mild nausea. We both agree that waiting a couple of weeks is reasonable to see if problem resolves. In the meantime I plan to research her home medication, especially Qsymia. If symptoms resolve she will most likely cancer EGD.   2. Mild constipation. No blood. No anemia. Again I plan to closely research some of her medications.   Tye Savoy, NP  10/15/2016, 3:01 PM  Cc: Plotnikov, Evie Lacks, MD

## 2016-10-16 LAB — H. PYLORI ANTIBODY, IGG: H PYLORI IGG: NEGATIVE

## 2016-10-20 NOTE — Progress Notes (Signed)
Sent to Dr. Silverio Decamp, it appears this case was scheduled with her. Thanks

## 2016-10-23 ENCOUNTER — Ambulatory Visit (INDEPENDENT_AMBULATORY_CARE_PROVIDER_SITE_OTHER)
Admission: RE | Admit: 2016-10-23 | Discharge: 2016-10-23 | Disposition: A | Discharge status: Home / Self Care | Payer: 59 | Source: Ambulatory Visit | Attending: Internal Medicine | Admitting: Internal Medicine

## 2016-10-23 ENCOUNTER — Ambulatory Visit (INDEPENDENT_AMBULATORY_CARE_PROVIDER_SITE_OTHER): Payer: 59 | Admitting: Internal Medicine

## 2016-10-23 ENCOUNTER — Encounter: Payer: Self-pay | Admitting: Internal Medicine

## 2016-10-23 ENCOUNTER — Other Ambulatory Visit (INDEPENDENT_AMBULATORY_CARE_PROVIDER_SITE_OTHER): Payer: 59

## 2016-10-23 DIAGNOSIS — R197 Diarrhea, unspecified: Secondary | ICD-10-CM | POA: Diagnosis not present

## 2016-10-23 DIAGNOSIS — R7989 Other specified abnormal findings of blood chemistry: Secondary | ICD-10-CM

## 2016-10-23 DIAGNOSIS — D508 Other iron deficiency anemias: Secondary | ICD-10-CM

## 2016-10-23 DIAGNOSIS — R1084 Generalized abdominal pain: Secondary | ICD-10-CM

## 2016-10-23 LAB — URINALYSIS
BILIRUBIN URINE: NEGATIVE
HGB URINE DIPSTICK: NEGATIVE
Ketones, ur: NEGATIVE
LEUKOCYTES UA: NEGATIVE
NITRITE: NEGATIVE
PH: 5.5 (ref 5.0–8.0)
Specific Gravity, Urine: 1.01 (ref 1.000–1.030)
Total Protein, Urine: NEGATIVE
Urine Glucose: NEGATIVE
Urobilinogen, UA: 0.2 (ref 0.0–1.0)

## 2016-10-23 LAB — HEPATIC FUNCTION PANEL
ALBUMIN: 4.1 g/dL (ref 3.5–5.2)
ALT: 12 U/L (ref 0–35)
AST: 18 U/L (ref 0–37)
Alkaline Phosphatase: 52 U/L (ref 39–117)
Bilirubin, Direct: 0.1 mg/dL (ref 0.0–0.3)
TOTAL PROTEIN: 7.3 g/dL (ref 6.0–8.3)
Total Bilirubin: 0.3 mg/dL (ref 0.2–1.2)

## 2016-10-23 LAB — TSH: TSH: 0.25 u[IU]/mL — AB (ref 0.35–4.50)

## 2016-10-23 LAB — CBC WITH DIFFERENTIAL/PLATELET
BASOS PCT: 0.5 % (ref 0.0–3.0)
Basophils Absolute: 0 10*3/uL (ref 0.0–0.1)
EOS ABS: 0.1 10*3/uL (ref 0.0–0.7)
Eosinophils Relative: 2 % (ref 0.0–5.0)
HEMATOCRIT: 36.8 % (ref 36.0–46.0)
Hemoglobin: 11.9 g/dL — ABNORMAL LOW (ref 12.0–15.0)
LYMPHS ABS: 1.1 10*3/uL (ref 0.7–4.0)
LYMPHS PCT: 23.9 % (ref 12.0–46.0)
MCHC: 32.5 g/dL (ref 30.0–36.0)
MCV: 79.3 fl (ref 78.0–100.0)
Monocytes Absolute: 0.5 10*3/uL (ref 0.1–1.0)
Monocytes Relative: 10.1 % (ref 3.0–12.0)
NEUTROS ABS: 2.9 10*3/uL (ref 1.4–7.7)
Neutrophils Relative %: 63.5 % (ref 43.0–77.0)
PLATELETS: 174 10*3/uL (ref 150.0–400.0)
RBC: 4.63 Mil/uL (ref 3.87–5.11)
RDW: 16 % — AB (ref 11.5–15.5)
WBC: 4.6 10*3/uL (ref 4.0–10.5)

## 2016-10-23 LAB — SEDIMENTATION RATE: SED RATE: 27 mm/h — AB (ref 0–20)

## 2016-10-23 LAB — HEMOGLOBIN A1C: Hgb A1c MFr Bld: 6.1 % (ref 4.6–6.5)

## 2016-10-23 LAB — CORTISOL: Cortisol, Plasma: 9.9 ug/dL

## 2016-10-23 LAB — IBC PANEL
Iron: 49 ug/dL (ref 42–145)
SATURATION RATIOS: 11.8 % — AB (ref 20.0–50.0)
TRANSFERRIN: 296 mg/dL (ref 212.0–360.0)

## 2016-10-23 LAB — VITAMIN B12: Vitamin B-12: 789 pg/mL (ref 211–911)

## 2016-10-23 LAB — VITAMIN D 25 HYDROXY (VIT D DEFICIENCY, FRACTURES): VITD: 31.06 ng/mL (ref 30.00–100.00)

## 2016-10-23 NOTE — Assessment & Plan Note (Signed)
Labs

## 2016-10-23 NOTE — Progress Notes (Signed)
Pre-visit discussion using our clinic review tool. No additional management support is needed unless otherwise documented below in the visit note.  

## 2016-10-23 NOTE — Assessment & Plan Note (Signed)
Labs abd X ray

## 2016-10-23 NOTE — Progress Notes (Signed)
Subjective:  Patient ID: [name], female    DOB: 1970-09-12  Age: 46 y.o. MRN: [id number]  CC: Abdominal Pain (medication causing side effects, nausea, milk products, ) and Diarrhea   HPI [name] presents for abd pain after eating a salad 1 mo ago - saw Nevin Bloodgood - Qsymia was d/c'd - better;on Protonix C/o fatigue    Outpatient Medications Prior to Visit  Medication Sig Dispense Refill  . ALPRAZolam (XANAX) 0.5 MG tablet Take 0.5 mg by mouth at bedtime.    . Ascorbic Acid (VITAMIN C) 100 MG tablet Take 200 mg by mouth 2 (two) times daily.      . Azelaic Acid (FINACEA) 15 % FOAM as needed.    . Bepotastine Besilate (BEPREVE) 1.5 % SOLN USE 1 DROP IN EACH EYE TWICE DAILY AS NEEDED FOR ITCHY EYES 10 mL 5  . cetirizine (ZYRTEC) 10 MG tablet Take 10 mg by mouth daily.    . Cholecalciferol (EQL VITAMIN D3) 1000 UNITS tablet Take 1,000 Units by mouth daily.      . Clocortolone Pivalate Pump 0.1 % CREA as needed.    . cyclobenzaprine (FLEXERIL) 10 MG tablet Take 10-20 mg by mouth at bedtime. Take 1-2 qhs    . ferrous sulfate 325 (65 FE) MG tablet Take 1 tablet (325 mg total) by mouth 2 (two) times daily. 180 tablet 3  . naproxen (NAPROSYN) 500 MG tablet Take 1 tablet (500 mg total) by mouth 2 (two) times daily with a meal. Use prn 60 tablet 3  . Olmesartan-Amlodipine-HCTZ 40-10-25 MG TABS TAKE 1 TABLET BY MOUTH DAILY. 30 tablet 5  . ondansetron (ZOFRAN) 4 MG tablet Take 1 tablet (4 mg total) by mouth every 8 (eight) hours as needed for nausea or vomiting. 15 tablet 0  . pantoprazole (PROTONIX) 40 MG tablet Take 1 tablet (40 mg total) by mouth daily. 30 tablet 0  . montelukast (SINGULAIR) 10 MG tablet Take 10 mg by mouth at bedtime.    . traMADol (ULTRAM) 50 MG tablet Take 1-2 tablets (50-100 mg total) by mouth 2 (two) times daily as needed for severe pain. 60 tablet 0  . QSYMIA 7.5-46 MG CP24 Take 1 capsule by mouth daily.  1   No facility-administered medications prior to visit.      ROS Review of Systems  Constitutional: Positive for fatigue. Negative for activity change, appetite change, chills and unexpected weight change.  HENT: Negative for congestion, mouth sores and sinus pressure.   Eyes: Negative for visual disturbance.  Respiratory: Negative for cough and chest tightness.   Gastrointestinal: Positive for abdominal pain. Negative for nausea.  Genitourinary: Negative for difficulty urinating, frequency and vaginal pain.  Musculoskeletal: Negative for back pain and gait problem.  Skin: Negative for pallor and rash.  Neurological: Negative for dizziness, tremors, weakness, numbness and headaches.  Psychiatric/Behavioral: Negative for confusion and sleep disturbance.    Objective:  BP (!) 92/48   Pulse 80   Temp 98.6 F (37 C) (Oral)   Resp 16   Ht 5\' 2"  (1.575 m)   Wt 214 lb (97.1 kg)   LMP 10/03/2016   SpO2 98%   BMI 39.14 kg/m   BP Readings from Last 3 Encounters:  10/23/16 (!) 92/48  10/15/16 118/70  10/06/16 126/88    Wt Readings from Last 3 Encounters:  10/23/16 214 lb (97.1 kg)  10/15/16 214 lb (97.1 kg)  10/06/16 213 lb 12.8 oz (97 kg)    Physical Exam  Constitutional:  She appears well-developed. No distress.  HENT:  Head: Normocephalic.  Right Ear: External ear normal.  Left Ear: External ear normal.  Nose: Nose normal.  Mouth/Throat: Oropharynx is clear and moist.  Eyes: Conjunctivae are normal. Pupils are equal, round, and reactive to light. Right eye exhibits no discharge. Left eye exhibits no discharge.  Neck: Normal range of motion. Neck supple. No JVD present. No tracheal deviation present. No thyromegaly present.  Cardiovascular: Normal rate, regular rhythm and normal heart sounds.   Pulmonary/Chest: No stridor. No respiratory distress. She has no wheezes.  Abdominal: Soft. Bowel sounds are normal. She exhibits no distension and no mass. There is no tenderness. There is no rebound and no guarding.  Musculoskeletal:  She exhibits no edema or tenderness.  Lymphadenopathy:    She has no cervical adenopathy.  Neurological: She displays normal reflexes. No cranial nerve deficit. She exhibits normal muscle tone. Coordination normal.  Skin: No rash noted. No erythema.  Psychiatric: She has a normal mood and affect. Her behavior is normal. Judgment and thought content normal.  Obese LLQ is sensitive  Lab Results  Component Value Date   WBC 5.1 04/21/2016   HGB 12.3 04/21/2016   HCT 36.5 04/21/2016   PLT 174.0 04/21/2016   GLUCOSE 93 10/06/2016   CHOL 180 04/21/2016   TRIG 104.0 04/21/2016   HDL 53.50 04/21/2016   LDLCALC 106 (H) 04/21/2016   ALT 14 10/06/2016   AST 17 10/06/2016   NA 137 10/06/2016   K 3.3 (L) 10/06/2016   CL 103 10/06/2016   CREATININE 0.61 10/06/2016   BUN 13 10/06/2016   CO2 24 10/06/2016   TSH 0.55 04/21/2016   INR 0.9 RATIO 03/15/2007   HGBA1C 5.8 04/21/2016    Dg Cervical Spine 2 Or 3 Views  Result Date: 01/02/2016 CLINICAL DATA:  Neck pain after fall 5 days ago. EXAM: CERVICAL SPINE - 2-3 VIEW COMPARISON:  None. FINDINGS: No fracture or spondylolisthesis is noted. Disc spaces are well-maintained. Mild anterior osteophyte formation is noted at C5-6. Posterior facet joints appear intact. IMPRESSION: Mild anterior osteophyte formation is noted at C5-6. No other abnormality seen in the cervical spine. Electronically Signed   By: Marijo Conception, M.D.   On: 01/02/2016 15:30    Assessment & Plan:   There are no diagnoses linked to this encounter. I have discontinued Ms. Artola's montelukast and traMADol. I am also having her maintain her cyclobenzaprine, vitamin C, Cholecalciferol, ALPRAZolam, naproxen, ferrous sulfate, Clocortolone Pivalate Pump, Azelaic Acid, Bepotastine Besilate, QSYMIA, cetirizine, Olmesartan-Amlodipine-HCTZ, pantoprazole, and ondansetron.  No orders of the defined types were placed in this encounter.    Follow-up: No Follow-up on file.  Walker Kehr, MD

## 2016-10-28 NOTE — Progress Notes (Signed)
Reviewed and agree with documentation and assessment and plan. K. Veena Nandigam , MD   

## 2016-10-30 ENCOUNTER — Telehealth: Payer: Self-pay | Admitting: Internal Medicine

## 2016-10-30 ENCOUNTER — Ambulatory Visit (INDEPENDENT_AMBULATORY_CARE_PROVIDER_SITE_OTHER): Payer: 59 | Admitting: Family Medicine

## 2016-10-30 ENCOUNTER — Encounter: Payer: Self-pay | Admitting: Internal Medicine

## 2016-10-30 ENCOUNTER — Encounter: Payer: Self-pay | Admitting: Family Medicine

## 2016-10-30 ENCOUNTER — Encounter: Payer: Self-pay | Admitting: Gastroenterology

## 2016-10-30 VITALS — BP 138/80 | HR 68 | Temp 98.8°F | Wt 223.2 lb

## 2016-10-30 DIAGNOSIS — M25473 Effusion, unspecified ankle: Secondary | ICD-10-CM

## 2016-10-30 DIAGNOSIS — M545 Low back pain: Secondary | ICD-10-CM | POA: Diagnosis not present

## 2016-10-30 DIAGNOSIS — J3089 Other allergic rhinitis: Secondary | ICD-10-CM | POA: Diagnosis not present

## 2016-10-30 DIAGNOSIS — M2550 Pain in unspecified joint: Secondary | ICD-10-CM | POA: Diagnosis not present

## 2016-10-30 NOTE — Telephone Encounter (Signed)
Routing to dr plotnikov, please advise, thanks 

## 2016-10-30 NOTE — Telephone Encounter (Signed)
Patient called back in regard.  Got patient scheduled at [place] at [place].

## 2016-10-30 NOTE — Telephone Encounter (Signed)
Pt called and said that since she was here for her visit on 10/23/16 she went on a trip to [facility]. While she was there she noticed that her left leg was swollen. Now that she is back home, the swelling/fluid has gone down but is now in her face. She also said that she has gained 10 pounds and belivies that she is retaining fluid. She wanted to know if there is anything that she can do to get rid of the fluid. She said "I can't go to church with my face like this! I look like the Easter Bunny!" :)  Please advise. Thanks [employer]

## 2016-10-30 NOTE — Progress Notes (Signed)
Pre visit review using our clinic review tool, if applicable. No additional management support is needed unless otherwise documented below in the visit note. 

## 2016-10-30 NOTE — Progress Notes (Signed)
Subjective:    Patient ID: [name], female    DOB: [birth year], 46 y.o.   MRN: [id number]  HPI  [name] is a 46 year old female who presents today with history of swollen ankles after wearing high heels during her visit to [facility]. She reports having swelling on her ankles and face this morning upon awakening which resolved after approximately 2 hours. This swelling was noted during her vacation in [place] where she spent a large amount of time on her feet. She denies chest pain, palpitations, SOB, numbness, tingling, weakness, or headaches She was recently seen by her PCP for blood work which was normal with exception of TSH which will be rechecked by her PCP and low Vitamin D. History of HTN; she monitors this at home and notes that her BP has remained controlled with olmesaratan-amlodipine-HCTZ. This is not a new medication and she has no history of allergies.  Denies: fever, chills sweats, myalgias, difficulty swallowing, hoarseness, SOB, N/V, abdominal pain, or tightening of throat.  No new exposures of soaps, lotions, laundry detergent, fabric softeners, foods, medications, plants, animals, or insects.  No treatment at home has been tried; she reports that swelling has subsided.   Review of Systems  Constitutional: Negative for activity change, chills, fatigue and fever.  HENT: Positive for sinus pain and sinus pressure. Negative for congestion, postnasal drip, rhinorrhea and sore throat.   Eyes:       Itchy, watery eyes  Respiratory: Negative for cough, shortness of breath and wheezing.   Cardiovascular: Negative for chest pain and palpitations.       Swelling of ankles  Gastrointestinal: Negative for abdominal pain, constipation, diarrhea, nausea and vomiting.  Musculoskeletal: Negative for myalgias.  Skin: Negative for rash.  Neurological: Negative for dizziness, weakness, light-headedness and headaches.   Past Medical History:  Diagnosis Date  . Allergy    rhinitis  . Anemia    iron deficiency  . Anemia, unspecified   . Asthma    asthma  . Colitis   . Hypertension   . Iron deficiency anemia, unspecified   . Leiomyoma of uterus, unspecified   . Leiomyoma of uterus, unspecified   . Meralgia paresthetica   . Nausea alone   . Obesity   . Other B-complex deficiencies   . Umbilical hernia   . Unspecified vitamin D deficiency      Social History   Social History  . Marital status: Single    Spouse name: N/A  . Number of children: N/A  . Years of education: N/A   Occupational History  . Technician [employer]   Social History Main Topics  . Smoking status: Never Smoker  . Smokeless tobacco: Never Used  . Alcohol use No  . Drug use: No  . Sexual activity: Not on file   Other Topics Concern  . Not on file   Social History Narrative  . No narrative on file    Past Surgical History:  Procedure Laterality Date  . fibriodidectomy  2009  . MYOMECTOMY     x 2    Family History  Problem Relation Age of Onset  . Hypertension Other     No Known Allergies  Current Outpatient Prescriptions on File Prior to Visit  Medication Sig Dispense Refill  . ALPRAZolam (XANAX) 0.5 MG tablet Take 0.5 mg by mouth at bedtime.    . Ascorbic Acid (VITAMIN C) 100 MG tablet Take 200 mg by mouth 2 (two) times daily.      Marland Kitchen  Azelaic Acid (FINACEA) 15 % FOAM as needed.    . Bepotastine Besilate (BEPREVE) 1.5 % SOLN USE 1 DROP IN EACH EYE TWICE DAILY AS NEEDED FOR ITCHY EYES 10 mL 5  . cetirizine (ZYRTEC) 10 MG tablet Take 10 mg by mouth daily.    . Cholecalciferol (EQL VITAMIN D3) 1000 UNITS tablet Take 1,000 Units by mouth daily.      . Clocortolone Pivalate Pump 0.1 % CREA as needed.    . cyclobenzaprine (FLEXERIL) 10 MG tablet Take 10-20 mg by mouth at bedtime. Take 1-2 qhs    . ferrous sulfate 325 (65 FE) MG tablet Take 1 tablet (325 mg total) by mouth 2 (two) times daily. 180 tablet 3  . naproxen (NAPROSYN) 500 MG tablet Take 1 tablet (500  mg total) by mouth 2 (two) times daily with a meal. Use prn 60 tablet 3  . Olmesartan-Amlodipine-HCTZ 40-10-25 MG TABS TAKE 1 TABLET BY MOUTH DAILY. 30 tablet 5  . ondansetron (ZOFRAN) 4 MG tablet Take 1 tablet (4 mg total) by mouth every 8 (eight) hours as needed for nausea or vomiting. 15 tablet 0  . pantoprazole (PROTONIX) 40 MG tablet Take 1 tablet (40 mg total) by mouth daily. 30 tablet 0   No current facility-administered medications on file prior to visit.     BP 138/80 (BP Location: Left Arm, Patient Position: Sitting, Cuff Size: Normal)   Pulse 68   Temp 98.8 F (37.1 C) (Oral)   Wt 223 lb 3.2 oz (101.2 kg)   LMP 10/03/2016   SpO2 98%   BMI 40.82 kg/m       Objective:   Physical Exam  Constitutional: She is oriented to person, place, and time. She appears well-developed and well-nourished.  Morbidly obese  HENT:  Right Ear: Tympanic membrane normal.  Left Ear: Tympanic membrane normal.  Nose: Rhinorrhea present. Right sinus exhibits no maxillary sinus tenderness and no frontal sinus tenderness. Left sinus exhibits no maxillary sinus tenderness and no frontal sinus tenderness.  Mouth/Throat: Mucous membranes are normal. No oropharyngeal exudate or posterior oropharyngeal erythema.  Erythematous nares  Eyes: Pupils are equal, round, and reactive to light. No scleral icterus.  Neck: Neck supple.  Cardiovascular: Normal rate, regular rhythm and intact distal pulses.   Pulmonary/Chest: Effort normal and breath sounds normal. She has no wheezes. She has no rales.  Abdominal: Soft. Bowel sounds are normal. There is no tenderness.  Musculoskeletal: She exhibits no edema.  Lymphadenopathy:    She has no cervical adenopathy.  Neurological: She is alert and oriented to person, place, and time.  Skin: Skin is warm and dry. No rash noted.  Psychiatric: She has a normal mood and affect. Her behavior is normal. Judgment and thought content normal.      Assessment & Plan:  1.  Allergic rhinitis due to other allergic trigger, unspecified chronicity, unspecified seasonality Symptoms of itchy, watery eyes and erythematous nares support treatment for allergic rhinitis. Advised continued use of Zyrtec with addition of flonase.  2. Ankle swelling, unspecified laterality Resolved; no edema noted upon exam. Advised her to follow up with her PCP if this occurred again, monitor BP and bring readings with her to her follow up appointment. We discussed the importance of avoiding salt in her diet.  No swelling noted in patient's face or ankles today; etiology of swelling noted this morning is unclear. No symptoms of rash, SOB, difficulty swallowing, or trouble breathing noted. Advised patient to use dye free/scent free products, and  document foods or any changes in lotions, detergents, fabric softeners, or medications to PCP. She is not on an ACE inhibitor and has no new medications.  Follow up with PCP schedule 11/2016. Advised her to follow up sooner if symptoms reoccur or contact 911 if she has any symptoms of anaphylaxis. She voiced understanding and agreed with plan.  Delano Metz, FNP-C

## 2016-10-30 NOTE — Patient Instructions (Addendum)
It was a pleasure to see you today. Please add flonase with Zyrtec and follow up with your provider for recheck of lab work as recommended.  If you develop any swelling of your face, lips, tongue, and have shortness of breath, please call 911 as we discussed. Also, try to consider using dye free/scent free products and also keep a journal of any new foods or products that you are using.   Allergic Rhinitis Allergic rhinitis is when the mucous membranes in the nose respond to allergens. Allergens are particles in the air that cause your body to have an allergic reaction. This causes you to release allergic antibodies. Through a chain of events, these eventually cause you to release histamine into the blood stream. Although meant to protect the body, it is this release of histamine that causes your discomfort, such as frequent sneezing, congestion, and an itchy, runny nose. What are the causes? Seasonal allergic rhinitis (hay fever) is caused by pollen allergens that may come from grasses, trees, and weeds. Year-round allergic rhinitis (perennial allergic rhinitis) is caused by allergens such as house dust mites, pet dander, and mold spores. What are the signs or symptoms?  Nasal stuffiness (congestion).  Itchy, runny nose with sneezing and tearing of the eyes. How is this diagnosed? Your health care provider can help you determine the allergen or allergens that trigger your symptoms. If you and your health care provider are unable to determine the allergen, skin or blood testing may be used. Your health care provider will diagnose your condition after taking your health history and performing a physical exam. Your health care provider may assess you for other related conditions, such as asthma, pink eye, or an ear infection. How is this treated? Allergic rhinitis does not have a cure, but it can be controlled by:  Medicines that block allergy symptoms. These may include allergy shots, nasal sprays,  and oral antihistamines.  Avoiding the allergen. Hay fever may often be treated with antihistamines in pill or nasal spray forms. Antihistamines block the effects of histamine. There are over-the-counter medicines that may help with nasal congestion and swelling around the eyes. Check with your health care provider before taking or giving this medicine. If avoiding the allergen or the medicine prescribed do not work, there are many new medicines your health care provider can prescribe. Stronger medicine may be used if initial measures are ineffective. Desensitizing injections can be used if medicine and avoidance does not work. Desensitization is when a patient is given ongoing shots until the body becomes less sensitive to the allergen. Make sure you follow up with your health care provider if problems continue. Follow these instructions at home: It is not possible to completely avoid allergens, but you can reduce your symptoms by taking steps to limit your exposure to them. It helps to know exactly what you are allergic to so that you can avoid your specific triggers. Contact a health care provider if:  You have a fever.  You develop a cough that does not stop easily (persistent).  You have shortness of breath.  You start wheezing.  Symptoms interfere with normal daily activities. This information is not intended to replace advice given to you by your health care provider. Make sure you discuss any questions you have with your health care provider. Document Released: 04/15/2001 Document Revised: 03/21/2016 Document Reviewed: 03/28/2013 Elsevier Interactive Patient Education  2017 [place] [employer] FAST TRACK!!!  SAME DAY Appointments  for ACUTE CARE  Such as: Sprains, Injuries, cuts, abrasions, rashes, muscle pain, joint pain, back pain Colds, flu, sore throats, headache, allergies, cough, fever  Ear pain, sinus and eye infections Abdominal pain,  nausea, vomiting, diarrhea, upset stomach Animal/insect bites  3 Easy Ways to Schedule: Walk-In Scheduling Call in scheduling Mychart Sign-up: [email or web address]

## 2016-11-01 ENCOUNTER — Encounter: Payer: Self-pay | Admitting: Internal Medicine

## 2016-11-01 MED ORDER — FUROSEMIDE 40 MG PO TABS: 40.0000 mg | ORAL_TABLET | Freq: Every day | ORAL | 1 refills | Status: DC | PRN

## 2016-11-01 NOTE — Telephone Encounter (Signed)
Go to UC or ER if the leg is swollen OK furosemide prn emailed Thx

## 2016-11-12 ENCOUNTER — Encounter: Payer: 59 | Admitting: Gastroenterology

## 2016-11-19 ENCOUNTER — Ambulatory Visit: Payer: 59 | Admitting: Internal Medicine

## 2017-01-09 DIAGNOSIS — E559 Vitamin D deficiency, unspecified: Secondary | ICD-10-CM | POA: Diagnosis not present

## 2017-01-09 DIAGNOSIS — I10 Essential (primary) hypertension: Secondary | ICD-10-CM | POA: Diagnosis not present

## 2017-01-14 ENCOUNTER — Other Ambulatory Visit: Payer: Self-pay | Admitting: Internal Medicine

## 2017-01-14 NOTE — Telephone Encounter (Signed)
Please advise, RX loaded if okay

## 2017-01-14 NOTE — Telephone Encounter (Signed)
Patient is requesting a prenatal vitamin with iron to be called into the pharmacy. She states Dr. Camila Li use to give her a rx for this. Please advise. Thank you.

## 2017-01-15 MED ORDER — PNV PRENATAL PLUS MULTIVITAMIN 27-1 MG PO TABS: 1.0000 | ORAL_TABLET | Freq: Every day | ORAL | 3 refills | Status: DC

## 2017-01-28 ENCOUNTER — Telehealth: Payer: Self-pay | Admitting: Internal Medicine

## 2017-01-29 NOTE — Telephone Encounter (Signed)
Pt is having leg cramps and needs to know what she needs to do about them ?

## 2017-02-02 NOTE — Telephone Encounter (Signed)
Try Tylenol PM prn OV if not better Thx

## 2017-02-03 NOTE — Telephone Encounter (Signed)
Pt.notified

## 2017-03-06 ENCOUNTER — Other Ambulatory Visit: Payer: Self-pay | Admitting: Internal Medicine

## 2017-03-25 ENCOUNTER — Telehealth: Payer: Self-pay | Admitting: Internal Medicine

## 2017-03-25 ENCOUNTER — Encounter: Payer: Self-pay | Admitting: Internal Medicine

## 2017-03-25 ENCOUNTER — Ambulatory Visit (INDEPENDENT_AMBULATORY_CARE_PROVIDER_SITE_OTHER): Payer: 59 | Admitting: Internal Medicine

## 2017-03-25 VITALS — BP 128/68 | HR 71 | Temp 98.0°F | Ht 62.0 in | Wt 214.0 lb

## 2017-03-25 DIAGNOSIS — I10 Essential (primary) hypertension: Secondary | ICD-10-CM

## 2017-03-25 DIAGNOSIS — Z23 Encounter for immunization: Secondary | ICD-10-CM

## 2017-03-25 DIAGNOSIS — L732 Hidradenitis suppurativa: Secondary | ICD-10-CM | POA: Insufficient documentation

## 2017-03-25 MED ORDER — NAPROXEN 500 MG PO TABS: 500.0000 mg | ORAL_TABLET | Freq: Two times a day (BID) | ORAL | 3 refills | Status: DC

## 2017-03-25 MED ORDER — DOXYCYCLINE HYCLATE 100 MG PO TABS: 100.0000 mg | ORAL_TABLET | Freq: Two times a day (BID) | ORAL | 0 refills | Status: DC

## 2017-03-25 MED ORDER — MUPIROCIN 2 % EX OINT: TOPICAL_OINTMENT | CUTANEOUS | 0 refills | Status: DC

## 2017-03-25 NOTE — Progress Notes (Signed)
Subjective:  Patient ID: [name], female    DOB: 12-27-1970  Age: 46 y.o. MRN: [id number]  CC: No chief complaint on file.   HPI [name] presents for a boil under L arm x 1 week   Outpatient Medications Prior to Visit  Medication Sig Dispense Refill  . ALPRAZolam (XANAX) 0.5 MG tablet Take 0.5 mg by mouth at bedtime.    . Ascorbic Acid (VITAMIN C) 100 MG tablet Take 200 mg by mouth 2 (two) times daily.      . Azelaic Acid (FINACEA) 15 % FOAM as needed.    . Bepotastine Besilate (BEPREVE) 1.5 % SOLN USE 1 DROP IN EACH EYE TWICE DAILY AS NEEDED FOR ITCHY EYES 10 mL 5  . cetirizine (ZYRTEC) 10 MG tablet Take 10 mg by mouth daily.    . Cholecalciferol (EQL VITAMIN D3) 1000 UNITS tablet Take 1,000 Units by mouth daily.      . Clocortolone Pivalate Pump 0.1 % CREA as needed.    . cyclobenzaprine (FLEXERIL) 10 MG tablet Take 10-20 mg by mouth at bedtime. Take 1-2 qhs    . ferrous sulfate 325 (65 FE) MG tablet Take 1 tablet (325 mg total) by mouth 2 (two) times daily. 180 tablet 3  . furosemide (LASIX) 40 MG tablet Take 1 tablet (40 mg total) by mouth daily as needed. 30 tablet 1  . naproxen (NAPROSYN) 500 MG tablet Take 1 tablet (500 mg total) by mouth 2 (two) times daily with a meal. Use prn 60 tablet 3  . Olmesartan-Amlodipine-HCTZ 40-10-25 MG TABS Take 1 tablet by mouth daily. 90 tablet 0  . ondansetron (ZOFRAN) 4 MG tablet Take 1 tablet (4 mg total) by mouth every 8 (eight) hours as needed for nausea or vomiting. 15 tablet 0  . pantoprazole (PROTONIX) 40 MG tablet Take 1 tablet (40 mg total) by mouth daily. 30 tablet 0  . Prenatal Vit-Fe Fumarate-FA (PNV PRENATAL PLUS MULTIVITAMIN) 27-1 MG TABS Take 1 tablet by mouth daily. 90 tablet 3   No facility-administered medications prior to visit.     ROS Review of Systems  Constitutional: Negative for activity change, appetite change, chills, fatigue and unexpected weight change.  HENT: Negative for congestion, mouth sores  and sinus pressure.   Eyes: Negative for visual disturbance.  Respiratory: Negative for cough and chest tightness.   Gastrointestinal: Negative for abdominal pain and nausea.  Genitourinary: Negative for difficulty urinating, frequency and vaginal pain.  Musculoskeletal: Negative for back pain and gait problem.  Skin: Positive for color change and wound. Negative for pallor and rash.  Neurological: Negative for dizziness, tremors, weakness, numbness and headaches.  Psychiatric/Behavioral: Negative for confusion and sleep disturbance.    Objective:  BP 128/68 (BP Location: Left Arm, Patient Position: Sitting, Cuff Size: Large)   Pulse 71   Temp 98 F (36.7 C) (Oral)   Ht 5\' 2"  (1.575 m)   Wt 214 lb (97.1 kg)   SpO2 100%   BMI 39.14 kg/m   BP Readings from Last 3 Encounters:  03/25/17 128/68  10/30/16 138/80  10/23/16 (!) 92/48    Wt Readings from Last 3 Encounters:  03/25/17 214 lb (97.1 kg)  10/30/16 223 lb 3.2 oz (101.2 kg)  10/23/16 214 lb (97.1 kg)    Physical Exam  Constitutional: She appears well-developed. No distress.  HENT:  Head: Normocephalic.  Right Ear: External ear normal.  Left Ear: External ear normal.  Nose: Nose normal.  Mouth/Throat: Oropharynx is clear and  moist.  Eyes: Pupils are equal, round, and reactive to light. Conjunctivae are normal. Right eye exhibits no discharge. Left eye exhibits no discharge.  Neck: Normal range of motion. Neck supple. No JVD present. No tracheal deviation present. No thyromegaly present.  Cardiovascular: Normal rate, regular rhythm and normal heart sounds.   Pulmonary/Chest: No stridor. No respiratory distress. She has no wheezes.  Abdominal: Soft. Bowel sounds are normal. She exhibits no distension and no mass. There is no tenderness. There is no rebound and no guarding.  Musculoskeletal: She exhibits no edema or tenderness.  Lymphadenopathy:    She has no cervical adenopathy.  Neurological: She displays normal  reflexes. No cranial nerve deficit. She exhibits normal muscle tone. Coordination normal.  Skin: No rash noted. There is erythema.  Psychiatric: She has a normal mood and affect. Her behavior is normal. Judgment and thought content normal.   L axilla abscess 2.5x1.0 cm   Procedure note:  Incision and Drainage of an Abscess L axilla   Indication : a localized collection of pus that is tender and not spontaneously resolving.    Risks including unsuccessful procedure , possible need for a repeat procedure due to pus accumulation, scar formation, and others as well as benefits were explained to the patient in detail. Written consent was obtained/signed.    The patient was placed in a decubitus position. The area of an abscess was prepped with povidone-iodine and draped in a sterile fashion. Local anesthesia with   2  cc of 2% lidocaine and epinephrine  was administered.  1 cm incision with #11strait blade was made. About 2 cc of purulent material was expressed. The abscess cavity was explored with a sterile hemostat and the walled- off pockets and septae were broken down bluntly. The cavity was irrigated with the rest of the anesthetic in the syringe and packed with 3 inches of  the iodoform gauze.   The wound was dressed with antibiotic ointment and Telfa pad.  Tolerated well. Complications: None.   Wound instructions provided. Koban applied    Lab Results  Component Value Date   WBC 4.6 10/23/2016   HGB 11.9 (L) 10/23/2016   HCT 36.8 10/23/2016   PLT 174.0 10/23/2016   GLUCOSE 93 10/06/2016   CHOL 180 04/21/2016   TRIG 104.0 04/21/2016   HDL 53.50 04/21/2016   LDLCALC 106 (H) 04/21/2016   ALT 12 10/23/2016   AST 18 10/23/2016   NA 137 10/06/2016   K 3.3 (L) 10/06/2016   CL 103 10/06/2016   CREATININE 0.61 10/06/2016   BUN 13 10/06/2016   CO2 24 10/06/2016   TSH 0.25 (L) 10/23/2016   INR 0.9 RATIO 03/15/2007   HGBA1C 6.1 10/23/2016    Dg Abd 2 Views  Result Date:  10/23/2016 CLINICAL DATA:  Generalized abdominal pain mostly after eating, nausea, diarrhea and constipation intermittently for 1 month EXAM: ABDOMEN - 2 VIEW COMPARISON:  CT abdomen and pelvis 10/22/2012 FINDINGS: Normal bowel gas pattern. No bowel dilatation or bowel wall thickening. No free intraperitoneal air. 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 No urinary tract calcification. Bones unremarkable. IMPRESSION: Normal bowel gas pattern. Electronically Signed   By: Lavonia Dana M.D.   On: 10/23/2016 09:03    Assessment & Plan:   There are no diagnoses linked to this encounter. I am having Ms. Faraj maintain her cyclobenzaprine, vitamin C, Cholecalciferol, ALPRAZolam, naproxen, ferrous sulfate, Clocortolone Pivalate Pump, Azelaic Acid, Bepotastine Besilate, cetirizine, pantoprazole, ondansetron, furosemide, PNV PRENATAL PLUS MULTIVITAMIN, Olmesartan-Amlodipine-HCTZ, and Lorcaserin  HCl.  Meds ordered this encounter  Medications  . Lorcaserin HCl (BELVIQ) 10 MG TABS    Sig: Take by mouth.     Follow-up: No Follow-up on file.  Walker Kehr, MD

## 2017-03-25 NOTE — Telephone Encounter (Signed)
Reviewed chart pt is up-to-date sent refill to [facility].../lmb

## 2017-03-25 NOTE — Assessment & Plan Note (Signed)
BP Readings from Last 3 Encounters:  03/25/17 128/68  10/30/16 138/80  10/23/16 (!) 92/48

## 2017-03-25 NOTE — Assessment & Plan Note (Addendum)
I&D Doxy Bactroban

## 2017-03-25 NOTE — Telephone Encounter (Signed)
Pt was seen to day and states Dr. Camila Li forgot to call her naproxen (NAPROSYN) 500 MG tablet  in  [facility] on [address]

## 2017-05-19 ENCOUNTER — Encounter: Payer: Self-pay | Admitting: Nurse Practitioner

## 2017-05-19 ENCOUNTER — Ambulatory Visit (INDEPENDENT_AMBULATORY_CARE_PROVIDER_SITE_OTHER): Payer: 59 | Admitting: Nurse Practitioner

## 2017-05-19 VITALS — BP 124/84 | HR 74 | Temp 98.2°F | Ht 62.0 in | Wt 214.0 lb

## 2017-05-19 DIAGNOSIS — J069 Acute upper respiratory infection, unspecified: Secondary | ICD-10-CM | POA: Diagnosis not present

## 2017-05-19 DIAGNOSIS — H6501 Acute serous otitis media, right ear: Secondary | ICD-10-CM

## 2017-05-19 MED ORDER — METHYLPREDNISOLONE ACETATE 80 MG/ML IJ SUSP
80.0000 mg | Freq: Once | INTRAMUSCULAR | Status: AC
  Administered 2017-05-19: 80 mg via INTRAMUSCULAR

## 2017-05-19 MED ORDER — OXYMETAZOLINE HCL 0.05 % NA SOLN: 1.0000 | Freq: Two times a day (BID) | NASAL | 0 refills | Status: DC

## 2017-05-19 MED ORDER — CEFUROXIME AXETIL 500 MG PO TABS: 500.0000 mg | ORAL_TABLET | Freq: Two times a day (BID) | ORAL | 0 refills | Status: DC

## 2017-05-19 MED ORDER — SALINE SPRAY 0.65 % NA SOLN: 1.0000 | NASAL | 0 refills | Status: DC | PRN

## 2017-05-19 MED ORDER — FLUTICASONE PROPIONATE 50 MCG/ACT NA SUSP: 2.0000 | Freq: Every day | NASAL | 0 refills | Status: DC

## 2017-05-19 MED ORDER — GUAIFENESIN ER 600 MG PO TB12: 600.0000 mg | ORAL_TABLET | Freq: Two times a day (BID) | ORAL | 0 refills | Status: DC | PRN

## 2017-05-19 NOTE — Progress Notes (Signed)
Subjective:  Patient ID: [name], female    DOB: 26-Dec-1970  Age: 46 y.o. MRN: [id number]  CC: Nasal Congestion (congestion,cant get it out and cant breath--going on for 4 days. took nyquil, ibuprofen,hot tea. nasal spray. )  URI   This is a new problem. The current episode started in the past 7 days. The problem has been gradually worsening. There has been no fever. Associated symptoms include congestion, coughing, ear pain, a plugged ear sensation, rhinorrhea, sinus pain, sneezing, a sore throat and swollen glands. Pertinent negatives include no wheezing. She has tried decongestant, antihistamine, acetaminophen and NSAIDs for the symptoms. The treatment provided no relief.    Outpatient Medications Prior to Visit  Medication Sig Dispense Refill  . ALPRAZolam (XANAX) 0.5 MG tablet Take 0.5 mg by mouth at bedtime.    . Ascorbic Acid (VITAMIN C) 100 MG tablet Take 200 mg by mouth 2 (two) times daily.      . Azelaic Acid (FINACEA) 15 % FOAM as needed.    . Bepotastine Besilate (BEPREVE) 1.5 % SOLN USE 1 DROP IN EACH EYE TWICE DAILY AS NEEDED FOR ITCHY EYES 10 mL 5  . cetirizine (ZYRTEC) 10 MG tablet Take 10 mg by mouth daily.    . Cholecalciferol (EQL VITAMIN D3) 1000 UNITS tablet Take 1,000 Units by mouth daily.      . Clocortolone Pivalate Pump 0.1 % CREA as needed.    . cyclobenzaprine (FLEXERIL) 10 MG tablet Take 10-20 mg by mouth at bedtime. Take 1-2 qhs    . ferrous sulfate 325 (65 FE) MG tablet Take 1 tablet (325 mg total) by mouth 2 (two) times daily. 180 tablet 3  . furosemide (LASIX) 40 MG tablet Take 1 tablet (40 mg total) by mouth daily as needed. 30 tablet 1  . Lorcaserin HCl (BELVIQ) 10 MG TABS Take by mouth.    . mupirocin ointment (BACTROBAN) 2 % Use qd-bid 15 g 0  . naproxen (NAPROSYN) 500 MG tablet Take 1 tablet (500 mg total) by mouth 2 (two) times daily with a meal. Use prn 60 tablet 3  . Olmesartan-Amlodipine-HCTZ 40-10-25 MG TABS Take 1 tablet by mouth daily. 90  tablet 0  . ondansetron (ZOFRAN) 4 MG tablet Take 1 tablet (4 mg total) by mouth every 8 (eight) hours as needed for nausea or vomiting. 15 tablet 0  . pantoprazole (PROTONIX) 40 MG tablet Take 1 tablet (40 mg total) by mouth daily. 30 tablet 0  . Prenatal Vit-Fe Fumarate-FA (PNV PRENATAL PLUS MULTIVITAMIN) 27-1 MG TABS Take 1 tablet by mouth daily. 90 tablet 3  . doxycycline (VIBRA-TABS) 100 MG tablet Take 1 tablet (100 mg total) by mouth 2 (two) times daily. 20 tablet 0   No facility-administered medications prior to visit.     ROS See HPI  Objective:  BP 124/84   Pulse 74   Temp 98.2 F (36.8 C)   Ht 5\' 2"  (1.575 m)   Wt 214 lb (97.1 kg)   SpO2 99%   BMI 39.14 kg/m   BP Readings from Last 3 Encounters:  05/19/17 124/84  03/25/17 128/68  10/30/16 138/80    Wt Readings from Last 3 Encounters:  05/19/17 214 lb (97.1 kg)  03/25/17 214 lb (97.1 kg)  10/30/16 223 lb 3.2 oz (101.2 kg)    Physical Exam  Constitutional: She is oriented to person, place, and time.  HENT:  Right Ear: External ear and ear canal normal. Tympanic membrane is injected and erythematous. A  middle ear effusion is present.  Left Ear: External ear and ear canal normal. Tympanic membrane is not injected and not erythematous. A middle ear effusion is present.  Nose: Mucosal edema and rhinorrhea present. Right sinus exhibits maxillary sinus tenderness. Right sinus exhibits no frontal sinus tenderness. Left sinus exhibits maxillary sinus tenderness. Left sinus exhibits no frontal sinus tenderness.  Mouth/Throat: Uvula is midline. No trismus in the jaw. Posterior oropharyngeal erythema present. No oropharyngeal exudate.  Eyes: No scleral icterus.  Neck: Normal range of motion. Neck supple.  Cardiovascular: Normal rate and normal heart sounds.   Pulmonary/Chest: Effort normal and breath sounds normal.  Musculoskeletal: She exhibits no edema.  Lymphadenopathy:    She has cervical adenopathy.  Neurological:  She is alert and oriented to person, place, and time.  Vitals reviewed.   Lab Results  Component Value Date   WBC 4.6 10/23/2016   HGB 11.9 (L) 10/23/2016   HCT 36.8 10/23/2016   PLT 174.0 10/23/2016   GLUCOSE 93 10/06/2016   CHOL 180 04/21/2016   TRIG 104.0 04/21/2016   HDL 53.50 04/21/2016   LDLCALC 106 (H) 04/21/2016   ALT 12 10/23/2016   AST 18 10/23/2016   NA 137 10/06/2016   K 3.3 (L) 10/06/2016   CL 103 10/06/2016   CREATININE 0.61 10/06/2016   BUN 13 10/06/2016   CO2 24 10/06/2016   TSH 0.25 (L) 10/23/2016   INR 0.9 RATIO 03/15/2007   HGBA1C 6.1 10/23/2016    Dg Abd 2 Views  Result Date: 10/23/2016 CLINICAL DATA:  Generalized abdominal pain mostly after eating, nausea, diarrhea and constipation intermittently for 1 month EXAM: ABDOMEN - 2 VIEW COMPARISON:  CT abdomen and pelvis 10/22/2012 FINDINGS: Normal bowel gas pattern. No bowel dilatation or bowel wall thickening. No free intraperitoneal air. 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 No urinary tract calcification. Bones unremarkable. IMPRESSION: Normal bowel gas pattern. Electronically Signed   By: Lavonia Dana M.D.   On: 10/23/2016 09:03    Assessment & Plan:   [name] was seen today for nasal congestion.  Diagnoses and all orders for this visit:  Acute URI -     fluticasone (FLONASE) 50 MCG/ACT nasal spray; Place 2 sprays into both nostrils daily. -     oxymetazoline (AFRIN NASAL SPRAY) 0.05 % nasal spray; Place 1 spray into both nostrils 2 (two) times daily. Use only for 3days, then stop -     sodium chloride (OCEAN) 0.65 % SOLN nasal spray; Place 1 spray into both nostrils as needed for congestion. -     methylPREDNISolone acetate (DEPO-MEDROL) injection 80 mg; Inject 1 mL (80 mg total) into the muscle once. -     guaiFENesin (MUCINEX) 600 MG 12 hr tablet; Take 1 tablet (600 mg total) by mouth 2 (two) times daily as needed for cough or to loosen phlegm. -     cefUROXime (CEFTIN) 500 MG tablet; Take 1 tablet  (500 mg total) by mouth 2 (two) times daily with a meal.  Right acute serous otitis media, recurrence not specified -     fluticasone (FLONASE) 50 MCG/ACT nasal spray; Place 2 sprays into both nostrils daily. -     methylPREDNISolone acetate (DEPO-MEDROL) injection 80 mg; Inject 1 mL (80 mg total) into the muscle once. -     cefUROXime (CEFTIN) 500 MG tablet; Take 1 tablet (500 mg total) by mouth 2 (two) times daily with a meal.   I have discontinued [name]'s doxycycline. I am also having her start  on fluticasone, oxymetazoline, sodium chloride, guaiFENesin, and cefUROXime. Additionally, I am having her maintain her cyclobenzaprine, vitamin C, Cholecalciferol, ALPRAZolam, ferrous sulfate, Clocortolone Pivalate Pump, Azelaic Acid, Bepotastine Besilate, cetirizine, pantoprazole, ondansetron, furosemide, PNV PRENATAL PLUS MULTIVITAMIN, Olmesartan-Amlodipine-HCTZ, Lorcaserin HCl, mupirocin ointment, and naproxen. We administered methylPREDNISolone acetate.  Meds ordered this encounter  Medications  . fluticasone (FLONASE) 50 MCG/ACT nasal spray    Sig: Place 2 sprays into both nostrils daily.    Dispense:  16 g    Refill:  0    Order Specific Question:   Supervising Provider    Answer:   Binnie Rail [[id number]]  . oxymetazoline (AFRIN NASAL SPRAY) 0.05 % nasal spray    Sig: Place 1 spray into both nostrils 2 (two) times daily. Use only for 3days, then stop    Dispense:  30 mL    Refill:  0    Order Specific Question:   Supervising Provider    Answer:   Binnie Rail [[id number]]  . sodium chloride (OCEAN) 0.65 % SOLN nasal spray    Sig: Place 1 spray into both nostrils as needed for congestion.    Dispense:  15 mL    Refill:  0    Order Specific Question:   Supervising Provider    Answer:   Binnie Rail [[id number]]  . methylPREDNISolone acetate (DEPO-MEDROL) injection 80 mg  . guaiFENesin (MUCINEX) 600 MG 12 hr tablet    Sig: Take 1 tablet (600 mg total) by mouth 2 (two) times daily  as needed for cough or to loosen phlegm.    Dispense:  14 tablet    Refill:  0    Order Specific Question:   Supervising Provider    Answer:   Binnie Rail [[id number]]  . cefUROXime (CEFTIN) 500 MG tablet    Sig: Take 1 tablet (500 mg total) by mouth 2 (two) times daily with a meal.    Dispense:  14 tablet    Refill:  0    Order Specific Question:   Supervising Provider    Answer:   Binnie Rail [[id number]]    Follow-up: No Follow-up on file.  Wilfred Lacy, NP

## 2017-05-19 NOTE — Patient Instructions (Signed)
URI Instructions: Flonase and Afrin use: apply 1spray of afrin in each nare, wait 17mins, then apply 2sprays of flonase in each nare. Use both nasal spray consecutively x 3days, then flonase only for at least 14days.  Encourage adequate oral hydration.  Use over-the-counter  "cold" medicines  such as "Tylenol cold" , "Advil cold",  "Mucinex" or" Mucinex D"  for cough and congestion.  Avoid decongestants if you have high blood pressure. Use" Delsym" or" Robitussin" cough syrup varietis for cough.  You can use plain "Tylenol" or "Advi"l for fever, chills and achyness.   "Common cold" symptoms are usually triggered by a virus.  The antibiotics are usually not necessary. On average, a" viral cold" illness would take 4-7 days to resolve. Please, make an appointment if you are not better or if you're worse.  Start cefuroxime if no improvement in 2-3days.

## 2017-06-03 ENCOUNTER — Other Ambulatory Visit: Payer: Self-pay | Admitting: Internal Medicine

## 2017-06-30 ENCOUNTER — Ambulatory Visit: Payer: 59 | Admitting: Internal Medicine

## 2017-06-30 ENCOUNTER — Encounter: Payer: Self-pay | Admitting: Internal Medicine

## 2017-06-30 ENCOUNTER — Other Ambulatory Visit (INDEPENDENT_AMBULATORY_CARE_PROVIDER_SITE_OTHER): Payer: 59

## 2017-06-30 VITALS — BP 126/82 | HR 75 | Temp 98.3°F | Ht 62.0 in | Wt 218.0 lb

## 2017-06-30 DIAGNOSIS — M2550 Pain in unspecified joint: Secondary | ICD-10-CM | POA: Diagnosis not present

## 2017-06-30 DIAGNOSIS — G4726 Circadian rhythm sleep disorder, shift work type: Secondary | ICD-10-CM | POA: Insufficient documentation

## 2017-06-30 DIAGNOSIS — R7989 Other specified abnormal findings of blood chemistry: Secondary | ICD-10-CM

## 2017-06-30 DIAGNOSIS — I10 Essential (primary) hypertension: Secondary | ICD-10-CM

## 2017-06-30 DIAGNOSIS — E559 Vitamin D deficiency, unspecified: Secondary | ICD-10-CM | POA: Diagnosis not present

## 2017-06-30 LAB — TSH: TSH: 0.59 u[IU]/mL (ref 0.35–4.50)

## 2017-06-30 LAB — T4, FREE: FREE T4: 0.74 ng/dL (ref 0.60–1.60)

## 2017-06-30 MED ORDER — ALPRAZOLAM 0.5 MG PO TABS: 0.5000 mg | ORAL_TABLET | Freq: Every day | ORAL | 1 refills | Status: DC

## 2017-06-30 MED ORDER — CYCLOBENZAPRINE HCL 10 MG PO TABS: 10.0000 mg | ORAL_TABLET | Freq: Every day | ORAL | 3 refills | Status: DC

## 2017-06-30 NOTE — Assessment & Plan Note (Signed)
On Vit D 

## 2017-06-30 NOTE — Assessment & Plan Note (Signed)
Dr Charlestine Night has retired Xanax prn, Flexeril prn

## 2017-06-30 NOTE — Assessment & Plan Note (Signed)
BP Readings from Last 3 Encounters:  06/30/17 126/82  05/19/17 124/84  03/25/17 128/68

## 2017-06-30 NOTE — Patient Instructions (Addendum)
Read bout Nuvigil:    Armodafinil tablets What is this medicine? ARMODAFINIL (ar moe DAF i nil) is used to treat excessive sleepiness caused by certain sleep disorders. This includes narcolepsy, sleep apnea, and shift work sleep disorder. This medicine may be used for other purposes; ask your health care provider or pharmacist if you have questions. COMMON BRAND NAME(S): Nuvigil What should I tell my health care provider before I take this medicine? They need to know if you have any of these conditions: -bipolar disorder -depression -drug or alcohol abuse or addiction -heart disease -high blood pressure -kidney disease -liver disease -schizophrenia -suicidal thoughts, plans, or attempt; a previous suicide attempt by you or a family member -an unusual or allergic reaction to armodafinil, modafinil, medicines, foods, dyes, or preservatives -pregnant or trying to get pregnant -breast-feeding How should I use this medicine? Take this medicine by mouth with a glass of water. Follow the directions on the prescription label. Take your doses at regular intervals. Do not take your medicine more often than directed. Do not stop taking this medicine suddenly except upon the advice of your doctor. Stopping this medicine too quickly may cause serious side effects or your condition may worsen. A special MedGuide will be given to you by the pharmacist with each prescription and refill. Be sure to read this information carefully each time. Talk to your pediatrician regarding the use of this medicine in children. While this drug may be prescribed for children as young as 77 years of age for selected conditions, precautions do apply. Overdosage: If you think you have taken too much of this medicine contact a poison control center or emergency room at once. NOTE: This medicine is only for you. Do not share this medicine with others. What if I miss a dose? If you miss a dose, take it as soon as you can. If  it is almost time for your next dose, take only that dose. Do not take double or extra doses. What may interact with this medicine? Do not take this medicine with any of the following medications: -amphetamine or dextroamphetamine -dexmethylphenidate or methylphenidate -MAOIs like Carbex, Eldepryl, Marplan, Nardil, and Parnate -pemoline -procarbazine This medicine may also interact with the following medications: -antifungal medicines like itraconazole or ketoconazole -barbiturates, like phenobarbital -birth control pills or other hormone-containing birth control devices or implants -carbamazepine -cyclosporine -diazepam -medicines for depression, anxiety, or psychotic disturbances -phenytoin -propranolol -triazolam -warfarin This list may not describe all possible interactions. Give your health care provider a list of all the medicines, herbs, non-prescription drugs, or dietary supplements you use. Also tell them if you smoke, drink alcohol, or use illegal drugs. Some items may interact with your medicine. What should I watch for while using this medicine? Visit your doctor or health care professional for regular checks on your progress. The full effect of this medicine may not be seen right away. This medicine may affect your concentration, function, or may hide signs that you are tired. You may get dizzy. This medicine will not eliminate your abnormal tendency to fall asleep and is not a replacement for sleep. Do not change your previous behavior regarding potentially dangerous activities, such as driving, using machinery, or doing anything that needs mental alertness until you know how this drug affects you. Alcohol can make you more dizzy and may interfere with your response to this medicine or your alertness. Avoid alcoholic drinks. Birth control pills may not work properly while you are taking this medicine. While using birth  control pills, you will need an additional barrier method or  an alternative non-hormonal method of birth control during treatment with armodafinil and for 1 month after stopping armodafinil. Talk to your doctor about which extra method of birth control is right for you. It is unknown if the effects of this medicine will be increased by the use of caffeine. Caffeine is found in many foods, beverages, and medications. Ask your doctor if you should limit or change your intake of caffeine-containing products while on this medicine. Do not stop previously prescribed treatments for your condition, such as a CPAP machine, except on the advise of your physician or health care professional. What side effects may I notice from receiving this medicine? Side effects that you should report to your doctor or health care professional as soon as possible: -allergic reactions like skin rash, itching or hives, swelling of the face, lips, or tongue -anxiety -breathing or swallowing problems -chest pain -depressed mood -elevated mood, decreased need for sleep, racing thoughts, impulsive behavior -fast, irregular heartbeat -fever with rash, swollen lymph nodes, or swelling of the face -hallucination, loss of contact with reality -increased blood pressure -mouth sores, blisters, or peeling skin -sore throat, fever, or chills -suicidal thoughts or other mood changes -tremors -vomiting Side effects that usually do not require medical attention (report to your doctor or health care professional if they continue or are bothersome): -headache -nausea, diarrhea, or stomach upset -nervousness -trouble sleeping This list may not describe all possible side effects. Call your doctor for medical advice about side effects. You may report side effects to FDA at 1-800-FDA-1088. Where should I keep my medicine? Keep out of the reach of children. Store at room temperature between 20 and 25 degrees C (68 and 77 degrees F). Throw away any unused medicine after the expiration date. NOTE:  This sheet is a summary. It may not cover all possible information. If you have questions about this medicine, talk to your doctor, pharmacist, or health care provider.  2018 [employer]vier/Gold Standard (2015-12-28 18:16:30)

## 2017-06-30 NOTE — Progress Notes (Signed)
Subjective:  Patient ID: [name], female    DOB: 07-Jul-1971  Age: 46 y.o. MRN: [id number]  CC: No chief complaint on file.   HPI Savannha Welle presents for arthralgias, anxiety,HTN, GERD On a shift work schedule  Outpatient Medications Prior to Visit  Medication Sig Dispense Refill  . ALPRAZolam (XANAX) 0.5 MG tablet Take 0.5 mg by mouth at bedtime.    . Ascorbic Acid (VITAMIN C) 100 MG tablet Take 200 mg by mouth 2 (two) times daily.      . Azelaic Acid (FINACEA) 15 % FOAM as needed.    . Bepotastine Besilate (BEPREVE) 1.5 % SOLN USE 1 DROP IN EACH EYE TWICE DAILY AS NEEDED FOR ITCHY EYES 10 mL 5  . cefUROXime (CEFTIN) 500 MG tablet Take 1 tablet (500 mg total) by mouth 2 (two) times daily with a meal. 14 tablet 0  . cetirizine (ZYRTEC) 10 MG tablet Take 10 mg by mouth daily.    . Cholecalciferol (EQL VITAMIN D3) 1000 UNITS tablet Take 1,000 Units by mouth daily.      . Clocortolone Pivalate Pump 0.1 % CREA as needed.    . cyclobenzaprine (FLEXERIL) 10 MG tablet Take 10-20 mg by mouth at bedtime. Take 1-2 qhs    . ferrous sulfate 325 (65 FE) MG tablet Take 1 tablet (325 mg total) by mouth 2 (two) times daily. 180 tablet 3  . fluticasone (FLONASE) 50 MCG/ACT nasal spray Place 2 sprays into both nostrils daily. 16 g 0  . furosemide (LASIX) 40 MG tablet Take 1 tablet (40 mg total) by mouth daily as needed. 30 tablet 1  . guaiFENesin (MUCINEX) 600 MG 12 hr tablet Take 1 tablet (600 mg total) by mouth 2 (two) times daily as needed for cough or to loosen phlegm. 14 tablet 0  . Lorcaserin HCl (BELVIQ) 10 MG TABS Take by mouth.    . mupirocin ointment (BACTROBAN) 2 % Use qd-bid 15 g 0  . naproxen (NAPROSYN) 500 MG tablet Take 1 tablet (500 mg total) by mouth 2 (two) times daily with a meal. Use prn 60 tablet 3  . Olmesartan-Amlodipine-HCTZ 40-10-25 MG TABS TAKE 1 TABLET BY MOUTH EVERY DAY 90 tablet 0  . ondansetron (ZOFRAN) 4 MG tablet Take 1 tablet (4 mg total) by mouth every 8  (eight) hours as needed for nausea or vomiting. 15 tablet 0  . oxymetazoline (AFRIN NASAL SPRAY) 0.05 % nasal spray Place 1 spray into both nostrils 2 (two) times daily. Use only for 3days, then stop 30 mL 0  . pantoprazole (PROTONIX) 40 MG tablet Take 1 tablet (40 mg total) by mouth daily. 30 tablet 0  . Prenatal Vit-Fe Fumarate-FA (PNV PRENATAL PLUS MULTIVITAMIN) 27-1 MG TABS Take 1 tablet by mouth daily. 90 tablet 3  . sodium chloride (OCEAN) 0.65 % SOLN nasal spray Place 1 spray into both nostrils as needed for congestion. 15 mL 0   No facility-administered medications prior to visit.     ROS Review of Systems  Constitutional: Positive for fatigue. Negative for activity change, appetite change, chills and unexpected weight change.  HENT: Negative for congestion, mouth sores and sinus pressure.   Eyes: Negative for visual disturbance.  Respiratory: Negative for cough and chest tightness.   Gastrointestinal: Negative for abdominal pain and nausea.  Genitourinary: Negative for difficulty urinating, frequency and vaginal pain.  Musculoskeletal: Positive for arthralgias. Negative for back pain and gait problem.  Skin: Negative for pallor and rash.  Neurological: Negative for dizziness,  tremors, weakness, numbness and headaches.  Psychiatric/Behavioral: Negative for confusion and sleep disturbance.    Objective:  BP 126/82 (BP Location: Left Arm, Patient Position: Sitting, Cuff Size: Large)   Pulse 75   Temp 98.3 F (36.8 C) (Oral)   Ht 5\' 2"  (1.575 m)   Wt 218 lb (98.9 kg)   SpO2 99%   BMI 39.87 kg/m   BP Readings from Last 3 Encounters:  06/30/17 126/82  05/19/17 124/84  03/25/17 128/68    Wt Readings from Last 3 Encounters:  06/30/17 218 lb (98.9 kg)  05/19/17 214 lb (97.1 kg)  03/25/17 214 lb (97.1 kg)    Physical Exam  Constitutional: She appears well-developed. No distress.  HENT:  Head: Normocephalic.  Right Ear: External ear normal.  Left Ear: External ear  normal.  Nose: Nose normal.  Mouth/Throat: Oropharynx is clear and moist.  Eyes: Conjunctivae are normal. Pupils are equal, round, and reactive to light. Right eye exhibits no discharge. Left eye exhibits no discharge.  Neck: Normal range of motion. Neck supple. No JVD present. No tracheal deviation present. No thyromegaly present.  Cardiovascular: Normal rate, regular rhythm and normal heart sounds.  Pulmonary/Chest: No stridor. No respiratory distress. She has no wheezes.  Abdominal: Soft. Bowel sounds are normal. She exhibits no distension and no mass. There is no tenderness. There is no rebound and no guarding.  Musculoskeletal: She exhibits tenderness. She exhibits no edema.  Lymphadenopathy:    She has no cervical adenopathy.  Neurological: She displays normal reflexes. No cranial nerve deficit. She exhibits normal muscle tone. Coordination normal.  Skin: No rash noted. No erythema.  Psychiatric: She has a normal mood and affect. Her behavior is normal. Judgment and thought content normal.    Lab Results  Component Value Date   WBC 4.6 10/23/2016   HGB 11.9 (L) 10/23/2016   HCT 36.8 10/23/2016   PLT 174.0 10/23/2016   GLUCOSE 93 10/06/2016   CHOL 180 04/21/2016   TRIG 104.0 04/21/2016   HDL 53.50 04/21/2016   LDLCALC 106 (H) 04/21/2016   ALT 12 10/23/2016   AST 18 10/23/2016   NA 137 10/06/2016   K 3.3 (L) 10/06/2016   CL 103 10/06/2016   CREATININE 0.61 10/06/2016   BUN 13 10/06/2016   CO2 24 10/06/2016   TSH 0.25 (L) 10/23/2016   INR 0.9 RATIO 03/15/2007   HGBA1C 6.1 10/23/2016    Dg Abd 2 Views  Result Date: 10/23/2016 CLINICAL DATA:  Generalized abdominal pain mostly after eating, nausea, diarrhea and constipation intermittently for 1 month EXAM: ABDOMEN - 2 VIEW COMPARISON:  CT abdomen and pelvis 10/22/2012 FINDINGS: Normal bowel gas pattern. No bowel dilatation or bowel wall thickening. No free intraperitoneal air. 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 No  urinary tract calcification. Bones unremarkable. IMPRESSION: Normal bowel gas pattern. Electronically Signed   By: Lavonia Dana M.D.   On: 10/23/2016 09:03    Assessment & Plan:   There are no diagnoses linked to this encounter. I am having [name] maintain her cyclobenzaprine, vitamin C, Cholecalciferol, ALPRAZolam, ferrous sulfate, Clocortolone Pivalate Pump, Azelaic Acid, Bepotastine Besilate, cetirizine, pantoprazole, ondansetron, furosemide, PNV PRENATAL PLUS MULTIVITAMIN, Lorcaserin HCl, mupirocin ointment, naproxen, fluticasone, oxymetazoline, sodium chloride, guaiFENesin, cefUROXime, and Olmesartan-Amlodipine-HCTZ.  No orders of the defined types were placed in this encounter.    Follow-up: No Follow-up on file.  Walker Kehr, MD

## 2017-06-30 NOTE — Assessment & Plan Note (Addendum)
1 week day and 1 week night work [employer] discussed Start yoga

## 2017-06-30 NOTE — Assessment & Plan Note (Addendum)
Wt Readings from Last 3 Encounters:  06/30/17 218 lb (98.9 kg)  05/19/17 214 lb (97.1 kg)  03/25/17 214 lb (97.1 kg)   BMI 39.87 - ref to see Dr Leafy Ro Low carb diet Labs

## 2017-07-07 ENCOUNTER — Other Ambulatory Visit: Payer: Self-pay | Admitting: Internal Medicine

## 2017-07-07 DIAGNOSIS — J069 Acute upper respiratory infection, unspecified: Secondary | ICD-10-CM

## 2017-07-07 DIAGNOSIS — H6501 Acute serous otitis media, right ear: Secondary | ICD-10-CM

## 2017-07-08 DIAGNOSIS — N61 Mastitis without abscess: Secondary | ICD-10-CM | POA: Diagnosis not present

## 2017-08-20 DIAGNOSIS — Z0100 Encounter for examination of eyes and vision without abnormal findings: Secondary | ICD-10-CM | POA: Diagnosis not present

## 2017-08-24 ENCOUNTER — Ambulatory Visit: Payer: Self-pay | Admitting: *Deleted

## 2017-08-24 ENCOUNTER — Other Ambulatory Visit: Payer: Self-pay

## 2017-08-24 ENCOUNTER — Encounter (HOSPITAL_COMMUNITY): Payer: Self-pay | Admitting: Emergency Medicine

## 2017-08-24 ENCOUNTER — Telehealth: Payer: Self-pay | Admitting: Internal Medicine

## 2017-08-24 ENCOUNTER — Emergency Department (HOSPITAL_COMMUNITY): Payer: 59

## 2017-08-24 ENCOUNTER — Emergency Department (HOSPITAL_COMMUNITY)
Admission: EM | Admit: 2017-08-24 | Discharge: 2017-08-24 | Disposition: A | Discharge status: Home / Self Care | Payer: 59 | Attending: Emergency Medicine | Admitting: Emergency Medicine

## 2017-08-24 DIAGNOSIS — R079 Chest pain, unspecified: Secondary | ICD-10-CM | POA: Diagnosis not present

## 2017-08-24 DIAGNOSIS — R072 Precordial pain: Secondary | ICD-10-CM | POA: Insufficient documentation

## 2017-08-24 DIAGNOSIS — I10 Essential (primary) hypertension: Secondary | ICD-10-CM | POA: Insufficient documentation

## 2017-08-24 DIAGNOSIS — J45909 Unspecified asthma, uncomplicated: Secondary | ICD-10-CM | POA: Diagnosis not present

## 2017-08-24 DIAGNOSIS — Z79899 Other long term (current) drug therapy: Secondary | ICD-10-CM | POA: Insufficient documentation

## 2017-08-24 DIAGNOSIS — M546 Pain in thoracic spine: Secondary | ICD-10-CM | POA: Insufficient documentation

## 2017-08-24 DIAGNOSIS — R0789 Other chest pain: Secondary | ICD-10-CM | POA: Diagnosis present

## 2017-08-24 DIAGNOSIS — M549 Dorsalgia, unspecified: Secondary | ICD-10-CM

## 2017-08-24 LAB — BASIC METABOLIC PANEL
ANION GAP: 7 (ref 5–15)
BUN: 9 mg/dL (ref 6–20)
CALCIUM: 9.2 mg/dL (ref 8.9–10.3)
CO2: 29 mmol/L (ref 22–32)
Chloride: 104 mmol/L (ref 101–111)
Creatinine, Ser: 0.52 mg/dL (ref 0.44–1.00)
GLUCOSE: 100 mg/dL — AB (ref 65–99)
POTASSIUM: 3.4 mmol/L — AB (ref 3.5–5.1)
Sodium: 140 mmol/L (ref 135–145)

## 2017-08-24 LAB — CBC
HEMATOCRIT: 38.6 % (ref 36.0–46.0)
HEMOGLOBIN: 12.6 g/dL (ref 12.0–15.0)
MCH: 26.3 pg (ref 26.0–34.0)
MCHC: 32.6 g/dL (ref 30.0–36.0)
MCV: 80.6 fL (ref 78.0–100.0)
Platelets: 138 10*3/uL — ABNORMAL LOW (ref 150–400)
RBC: 4.79 MIL/uL (ref 3.87–5.11)
RDW: 14.8 % (ref 11.5–15.5)
WBC: 5.1 10*3/uL (ref 4.0–10.5)

## 2017-08-24 LAB — I-STAT TROPONIN, ED: TROPONIN I, POC: 0 ng/mL (ref 0.00–0.08)

## 2017-08-24 LAB — I-STAT BETA HCG BLOOD, ED (MC, WL, AP ONLY): I-stat hCG, quantitative: <5 m[IU]/mL (ref <5)

## 2017-08-24 NOTE — ED Triage Notes (Signed)
Pt complaint of right chest pain radiating to back onset since Friday; pain worse with movement cough/sneeze/laugh.

## 2017-08-24 NOTE — ED Notes (Signed)
Pt in X ray

## 2017-08-24 NOTE — Telephone Encounter (Signed)
Pt called with complaints of right chest pain that started on Friday ; it is in her shoulder blade and and shoulder; she states that it started in her chest and went to her back; described as it :feels like some one has their fist in my back"; per nurse triage recommendation made for pt to call EMS; she states that she will have her mother take her to the ED; will route to [facility] [place] for notification of this encounter.  Reason for Disposition . [1] Chest pain lasts > 5 minutes AND [2] described as crushing, pressure-like, or heavy  Answer Assessment - Initial Assessment Questions 1. LOCATION: "Where does it hurt?"       Right Chest,  2. RADIATION: "Does the pain go anywhere else?" (e.g., into neck, jaw, arms, back)     Right shoulder, right shoulder blade and back 3. ONSET: "When did the chest pain begin?" (Minutes, hours or days)      Days  4. PATTERN "Does the pain come and go, or has it been constant since it started?"  "Does it get worse with exertion?"      constant 5. DURATION: "How long does it last" (e.g., seconds, minutes, hours)     hours 6. SEVERITY: "How bad is the pain?"  (e.g., Scale 1-10; mild, moderate, or severe)    - MILD (1-3): doesn't interfere with normal activities     - MODERATE (4-7): interferes with normal activities or awakens from sleep    - SEVERE (8-10): excruciating pain, unable to do any normal activities       Rated 7 out of 10 7. CARDIAC RISK FACTORS: "Do you have any history of heart problems or risk factors for heart disease?" (e.g., prior heart attack, angina; high blood pressure, diabetes, being overweight, high cholesterol, smoking, or strong family history of heart disease)     High blood pressure' overweight 8. PULMONARY RISK FACTORS: "Do you have any history of lung disease?"  (e.g., blood clots in lung, asthma, emphysema, birth control pills)     no 9. CAUSE: "What do you think is causing the chest pain?"     unsure 10. OTHER SYMPTOMS: "Do you have  any other symptoms?" (e.g., dizziness, nausea, vomiting, sweating, fever, difficulty breathing, cough)       Sweating, all of a sudden get hot; inflammation to the affected area 11. PREGNANCY: "Is there any chance you are pregnant?" "When was your last menstrual period?"       No LMP Jul 15, 2017  Protocols used: CHEST PAIN-A-AH

## 2017-08-24 NOTE — Telephone Encounter (Signed)
noted 

## 2017-08-24 NOTE — ED Provider Notes (Signed)
[facility] DEPT Provider Note   CSN: [id number] Arrival date & time: 08/24/17  2993     History   Chief Complaint Chief Complaint  Patient presents with  . Chest Pain    HPI Olive Zmuda is a 47 y.o. female.  HPI Patient is a 47 year old female presents the emergency department with 3 days of constant anterior chest pain with radiation towards her back.  She feels like it is a fist in her back.  She states is worse with movement coughing sneezing and laughing.  No shortness of breath.  Denies nausea vomiting.  No diaphoresis.  This began in the setting of an argument.  She denies weakness of her arms or legs.  Denies abdominal pain or low back pain.  She tried anti-inflammatories without improvement in her symptoms.  Pain is mild to moderate in severity.  No history of DVT or pulmonary embolism.  No unilateral leg swelling.  No history of heart disease or congestive heart failure.  Does not smoke cigarettes.   Past Medical History:  Diagnosis Date  . Allergy    rhinitis  . Anemia    iron deficiency  . Anemia, unspecified   . Asthma    asthma  . Colitis   . Hypertension   . Iron deficiency anemia, unspecified   . Leiomyoma of uterus, unspecified   . Leiomyoma of uterus, unspecified   . Meralgia paresthetica   . Nausea alone   . Obesity   . Other B-complex deficiencies   . Umbilical hernia   . Unspecified vitamin D deficiency     Patient Active Problem List   Diagnosis Date Noted  . 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 Hydradenitis 03/25/2017  . Abdominal pain 10/06/2016  . Need for tuberculosis vaccination 07/02/2016  . Heavy periods 04/21/2016  . Neck muscle strain 01/02/2016  . Acute upper respiratory infection 09/11/2015  . Superficial phlebitis 08/11/2014  . Reaction, adjustment, with depressed mood, brief 03/17/2014  . Sinusitis, acute 08/24/2013  . Need for prophylactic vaccination and inoculation against other  combinations of diseases 12/16/2012  . Boil, face 11/22/2012  . Colitis 11/03/2012  . RLQ abdominal pain 10/21/2012  . Fever 10/21/2012  . Hypokalemia 10/21/2012  . Colitis, acute 10/21/2012  . Right ovarian cyst 10/21/2012  . Adrenal nodule ([place]) 10/21/2012  . Fatigue 07/08/2012  . Well adult exam 03/31/2012  . TACHYCARDIA 10/01/2010  . HYPERGLYCEMIA 03/04/2010  . Pain in joint 01/18/2010  . LOW BACK PAIN, ACUTE 01/18/2010  . MERALGIA PARESTHETICA 12/18/2009  . ALOPECIA 10/05/2009  . CYSTITIS, ACUTE 06/15/2009  . CONTACT DERMATITIS&OTH ECZEMA DUE OTH [place] AGENT 04/06/2009  . OBESITY, MORBID 06/23/2008  . BRONCHITIS, ACUTE 06/05/2008  . 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 LEG PAIN 01/14/2008  . PARESTHESIA 01/14/2008  . Essential hypertension 12/17/2007  . ALLERGIC RHINITIS 12/17/2007  . DYSPNEA 07/12/2007  . Vitamin D deficiency 06/30/2007  . NAUSEA ALONE 06/30/2007  . B12 deficiency 06/11/2007  . Unspecified Iron Deficiency Anemia 06/11/2007  . FIBROIDS, UTERUS 03/13/2007  . Anemia 03/13/2007    Past Surgical History:  Procedure Laterality Date  . fibriodidectomy  2009  . MYOMECTOMY     x 2    OB History    No data available       Home Medications    Prior to Admission medications   Medication Sig Start Date End Date Taking? Authorizing Provider  ALPRAZolam Duanne Moron) 0.5 MG tablet Take 1-2 tablets (0.5-1 mg total) by mouth  at bedtime. 3 months supply 06/30/17   Plotnikov, Evie Lacks, MD  Ascorbic Acid (VITAMIN C) 100 MG tablet Take 200 mg by mouth 2 (two) times daily.      [provider]  Azelaic Acid (FINACEA) 15 % FOAM as needed. 01/03/15   [provider]  Bepotastine Besilate (BEPREVE) 1.5 % SOLN USE 1 DROP IN [facility] EYE TWICE DAILY AS NEEDED FOR ITCHY EYES 01/01/16   Kozlow, Donnamarie Poag, MD  cetirizine (ZYRTEC) 10 MG tablet Take 10 mg by mouth daily.    [provider]  Cholecalciferol (EQL VITAMIN D3) 1000 UNITS tablet Take 1,000 Units by  mouth daily.      [provider]  Clocortolone Pivalate Pump 0.1 % CREA as needed. 01/26/15   [provider]  cyclobenzaprine (FLEXERIL) 10 MG tablet Take 1-2 tablets (10-20 mg total) by mouth at bedtime. Take 1-2 qhs 06/30/17   Plotnikov, Evie Lacks, MD  ferrous sulfate 325 (65 FE) MG tablet Take 1 tablet (325 mg total) by mouth 2 (two) times daily. 07/24/15   Plotnikov, Evie Lacks, MD  fluticasone (FLONASE) 50 MCG/ACT nasal spray SPRAY 2 SPRAYS INTO EACH NOSTRIL EVERY DAY 07/08/17   Plotnikov, Evie Lacks, MD  furosemide (LASIX) 40 MG tablet Take 1 tablet (40 mg total) by mouth daily as needed. 11/01/16 11/01/17  Plotnikov, Evie Lacks, MD  guaiFENesin (MUCINEX) 600 MG 12 hr tablet Take 1 tablet (600 mg total) by mouth 2 (two) times daily as needed for cough or to loosen phlegm. 05/19/17   Nche, Charlene Brooke, NP  Lorcaserin HCl (BELVIQ) 10 MG TABS Take by mouth.    [provider]  mupirocin ointment (BACTROBAN) 2 % Use qd-bid 03/25/17   Plotnikov, Evie Lacks, MD  naproxen (NAPROSYN) 500 MG tablet Take 1 tablet (500 mg total) by mouth 2 (two) times daily with a meal. Use prn 03/25/17   Plotnikov, Evie Lacks, MD  Olmesartan-Amlodipine-HCTZ 40-10-25 MG TABS TAKE 1 TABLET BY MOUTH EVERY DAY 06/06/17   Plotnikov, Evie Lacks, MD  ondansetron (ZOFRAN) 4 MG tablet Take 1 tablet (4 mg total) by mouth every 8 (eight) hours as needed for nausea or vomiting. 10/15/16   Doran Stabler, MD  oxymetazoline (AFRIN NASAL SPRAY) 0.05 % nasal spray Place 1 spray into both nostrils 2 (two) times daily. Use only for 3days, then stop 05/19/17   Nche, Charlene Brooke, NP  pantoprazole (PROTONIX) 40 MG tablet Take 1 tablet (40 mg total) by mouth daily. 10/15/16   Doran Stabler, MD  Prenatal Vit-Fe Fumarate-FA (PNV PRENATAL PLUS MULTIVITAMIN) 27-1 MG TABS Take 1 tablet by mouth daily. 01/15/17   Plotnikov, Evie Lacks, MD  sodium chloride (OCEAN) 0.65 % SOLN nasal spray Place 1 spray into both nostrils as  needed for congestion. 05/19/17   Nche, Charlene Brooke, NP    Family History Family History  Problem Relation Age of Onset  . Hypertension Other     Social History Social History   Tobacco Use  . Smoking status: Never Smoker  . Smokeless tobacco: Never Used  Substance Use Topics  . Alcohol use: No  . Drug use: No     Allergies   Patient has no known allergies.   Review of Systems Review of Systems  All other systems reviewed and are negative.    Physical Exam Updated Vital Signs BP 140/84 (BP Location: Right Arm)   Pulse 93   Temp 98.1 F (36.7 C) (Oral)   Resp 16  Ht 5\' 2"  (1.575 m)   Wt 99.8 kg (220 lb)   LMP 07/15/2017   SpO2 99%   BMI 40.24 kg/m   Physical Exam  Constitutional: She is oriented to person, place, and time. She appears well-developed and well-nourished. No distress.  HENT:  Head: Normocephalic and atraumatic.  Eyes: EOM are normal.  Neck: Normal range of motion.  Cardiovascular: Normal rate, regular rhythm and normal heart sounds.  Normal radial pulses bilaterally  Pulmonary/Chest: Effort normal and breath sounds normal.  Abdominal: Soft. She exhibits no distension. There is no tenderness.  Musculoskeletal: Normal range of motion.  No thoracic or lumbar tenderness.  Mild trapezius tenderness on the right without signal and spasm.  Neurological: She is alert and oriented to person, place, and time.  Skin: Skin is warm and dry.  Psychiatric: She has a normal mood and affect. Judgment normal.  Nursing note and vitals reviewed.    ED Treatments / Results  Labs (all labs ordered are listed, but only abnormal results are displayed) Labs Reviewed  BASIC METABOLIC PANEL - Abnormal; Notable for the following components:      Result Value   Potassium 3.4 (*)    Glucose, Bld 100 (*)    All other components within normal limits  CBC - Abnormal; Notable for the following components:   Platelets 138 (*)    All other components within  normal limits  I-STAT TROPONIN, ED  I-STAT BETA HCG BLOOD, ED ([facility], [facility], [facility] ONLY)    EKG  EKG Interpretation  Date/Time:  Monday August 24 2017 09:08:48 EST Ventricular Rate:  86 PR Interval:    QRS Duration: 92 QT Interval:  355 QTC Calculation: 425 R Axis:   41 Text Interpretation:  Sinus rhythm Abnormal R-wave progression, early transition Borderline T wave abnormalities No old tracing to compare Confirmed by Jola Schmidt [phone]) on 08/24/2017 10:04:32 AM       Radiology Dg Chest 2 View  Result Date: 08/24/2017 CLINICAL DATA:  Chest pain. EXAM: CHEST  2 VIEW COMPARISON:  None. FINDINGS: Normal cardiac silhouette and mediastinal contours. There is mild thickening of the pulmonary interstitium, most conspicuous about the bilateral pulmonary hila. No discrete focal airspace opacities. No pleural effusion or pneumothorax. No evidence of edema. No acute osseus abnormalities. IMPRESSION: Findings suggestive of airways disease / bronchitis. No focal airspace opacities to suggest pneumonia. Electronically Signed   By: Sandi Mariscal M.D.   On: 08/24/2017 09:23    Procedures Procedures (including critical care time)  Medications Ordered in ED Medications - No data to display   Initial Impression / Assessment and Plan / ED Course  I have reviewed the triage vital signs and the nursing notes.  Pertinent labs & imaging results that were available during my care of the patient were reviewed by me and considered in my medical decision making (see chart for details).     Overall well-appearing.  Doubt ACS.  Doubt PE.  Doubt dissection.  Normal radial pulses.  Heart rate and blood pressure without significant abnormality.  Troponin negative despite constant pain.  Suspect pleurisy versus musculoskeletal pain.  Worse with laughing and sneezing.  Discharged home in good condition.  Primary care follow-up.  Patient understands return to the emergency department for new or worsening  symptoms  Final Clinical Impressions(s) / ED Diagnoses   Final diagnoses:  Precordial chest pain  Upper back pain    ED Discharge Orders    None       [place],  Lennette Bihari, MD 08/24/17 1015

## 2017-08-24 NOTE — Telephone Encounter (Signed)
Copied from [place] [phone]. Topic: General - Other >> Aug 24, 2017 12:52 PM Valla Leaver wrote: Reason for CRM: Patient's mother Reginia called to see if we can get her daughter in sooner to see Dr. Alain Marion. Patient say's she will wait to see if Dr. Mamie Nick can call something in for her pain. The pain is excruciating and flexeril is not helping. She does not want to have a sick visit at another [facility] when she has just been discharged from [facility] ED with "no help". They are requesting a call back from [facility] or Plotnikov.

## 2017-08-24 NOTE — Telephone Encounter (Signed)
Copied from [place] [phone]. Topic: Quick Communication - See Telephone Encounter >> Aug 24, 2017 11:04 AM Cl[place], Laban Emperor wrote: CRM for notification. See Telephone encounter for:  Pt would like to know if Dr. Alain Marion can send her something in for her back pain. She states she had to go to the ED today b/c of the back pain and they did not give her anything for it. She also has a hospital f/u sch/ with Dr. Alain Marion for 08/27/17.   Pt states that she is taking cyclobenzaprine (FLEXERIL) 10 MG tablet [[id number]] but they are not helping.   [id number] [address], [place] [phone] (Phone) [phone] (Fax)    08/24/17.

## 2017-08-25 NOTE — Telephone Encounter (Signed)
Pt will need to make ED f/u appt for pain med to be rx. Per chart appt has been made for 08/27/17.Marland KitchenJohny Chess

## 2017-08-27 ENCOUNTER — Inpatient Hospital Stay: Payer: 59 | Admitting: Internal Medicine

## 2017-09-02 ENCOUNTER — Encounter: Payer: Self-pay | Admitting: Internal Medicine

## 2017-09-03 ENCOUNTER — Encounter: Payer: Self-pay | Admitting: Family Medicine

## 2017-09-03 ENCOUNTER — Ambulatory Visit: Payer: 59 | Admitting: Family Medicine

## 2017-09-03 VITALS — BP 134/76 | HR 78 | Temp 98.3°F | Ht 62.0 in | Wt 226.0 lb

## 2017-09-03 DIAGNOSIS — M898X1 Other specified disorders of bone, shoulder: Secondary | ICD-10-CM

## 2017-09-03 NOTE — Patient Instructions (Signed)
Please try the exercises  Try a thera cane  Try aspercream with lidocaine  Please follow up with me in 4-6 weeks if no improvement

## 2017-09-03 NOTE — Progress Notes (Signed)
[name] - 47 y.o. female MRN [id number]  Date of birth: [birth year]  SUBJECTIVE:  Including CC & ROS.  Chief Complaint  Patient presents with  . Right shoulder pain    [name] is a 47 y.o. female that is  Presenting with pain around her right scapula. Pain started two weeks ago after she lifted a case of water. Denies injury or prior surgeries. Pain located at around the medial and superior border of the scapula. Admits to tingling and numbness. Certain movements-raising her arm create a sharp pain. She went to the ED on 08/24/17 for chest and shoulder pain. She has been taking Flexeril and Naproxen with no improvement.  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 Denies any previous pain. Has not any SOB.   Independent review of the chest xray from 1/21 shows normal bone structure     Review of Systems  Constitutional: Negative for fever.  HENT: Negative for ear pain.   Respiratory: Negative for cough.   Cardiovascular: Negative for chest pain.  Gastrointestinal: Negative for abdominal pain.  Musculoskeletal: Positive for back pain.  Skin: Negative for color change.  Neurological: Negative for weakness.  Hematological: Negative for adenopathy.  Psychiatric/Behavioral: Negative for agitation.    HISTORY: Past Medical, Surgical, Social, and Family History Reviewed & Updated per EMR.   Pertinent Historical Findings include:  Past Medical History:  Diagnosis Date  . Allergy    rhinitis  . Anemia    iron deficiency  . Anemia, unspecified   . Asthma    asthma  . Colitis   . Hypertension   . Iron deficiency anemia, unspecified   . Leiomyoma of uterus, unspecified   . Leiomyoma of uterus, unspecified   . Meralgia paresthetica   . Nausea alone   . Obesity   . Other B-complex deficiencies   . Umbilical hernia   . Unspecified vitamin D deficiency     Past Surgical History:  Procedure Laterality Date  . fibriodidectomy  2009  . MYOMECTOMY      x 2    No Known Allergies  Family History  Problem Relation Age of Onset  . Hypertension Other      Social History   Socioeconomic History  . Marital status: Single    Spouse name: Not on file  . Number of children: Not on file  . Years of education: Not on file  . Highest education level: Not on file  Social Needs  . Financial resource strain: Not on file  . Food insecurity - worry: Not on file  . Food insecurity - inability: Not on file  . Transportation needs - medical: Not on file  . Transportation needs - non-medical: Not on file  Occupational History  . Occupation: Environmental health practitioner: [employer]  Tobacco Use  . Smoking status: Never Smoker  . Smokeless tobacco: Never Used  Substance and Sexual Activity  . Alcohol use: No  . Drug use: No  . Sexual activity: Not on file  Other Topics Concern  . Not on file  Social History Narrative  . Not on file     PHYSICAL EXAM:  VS: BP 134/76 (BP Location: Left Arm, Patient Position: Sitting, Cuff Size: Normal)   Pulse 78   Temp 98.3 F (36.8 C) (Oral)   Ht 5\' 2"  (1.575 m)   Wt 226 lb (102.5 kg)   SpO2 96%   BMI 41.34 kg/m  Physical Exam Gen: NAD,  alert, cooperative with exam, well-appearing ENT: normal lips, normal nasal mucosa,  Eye: normal EOM, normal conjunctiva and lids CV:  no edema, +2 pedal pulses   Resp: no accessory muscle use, non-labored,  Skin: no rashes, no areas of induration  Neuro: normal tone, normal sensation to touch Psych:  normal insight, alert and oriented MSK:  Right shoulder:  TTP of the medial border and proximal border of scapula  No winging of the right scapula  Normal active abduction and flexion  Normal strength to resistance with IR and ER  Normal ER b/l  Normal Empty Can testing  Normal Hawkin's testing  Normal cross arm  Neck:  Normal ROM  No TTP of the cervical spine  Neurovascularly intact      ASSESSMENT & PLAN:   Periscapular pain No winging but  appears to myofascial around the scapula. Shoulder appears to be strong  - referral to PT  - if no improvement then try trigger point injections  - counseled on HEP  - counseled on posture and work environment

## 2017-09-03 NOTE — Telephone Encounter (Signed)
appt made for 320 with Dr. Raeford Razor

## 2017-09-04 DIAGNOSIS — M898X1 Other specified disorders of bone, shoulder: Secondary | ICD-10-CM | POA: Insufficient documentation

## 2017-09-04 NOTE — Assessment & Plan Note (Signed)
No winging but appears to myofascial around the scapula. Shoulder appears to be strong  - referral to PT  - if no improvement then try trigger point injections  - counseled on HEP  - counseled on posture and work environment

## 2017-09-10 ENCOUNTER — Ambulatory Visit: Payer: 59 | Admitting: Internal Medicine

## 2017-09-14 ENCOUNTER — Ambulatory Visit: Payer: 59

## 2017-09-14 ENCOUNTER — Other Ambulatory Visit: Payer: Self-pay | Admitting: Internal Medicine

## 2017-09-28 DIAGNOSIS — L700 Acne vulgaris: Secondary | ICD-10-CM | POA: Diagnosis not present

## 2017-09-30 ENCOUNTER — Other Ambulatory Visit: Payer: Self-pay | Admitting: Internal Medicine

## 2017-09-30 ENCOUNTER — Other Ambulatory Visit (INDEPENDENT_AMBULATORY_CARE_PROVIDER_SITE_OTHER): Payer: 59

## 2017-09-30 ENCOUNTER — Encounter: Payer: Self-pay | Admitting: Internal Medicine

## 2017-09-30 ENCOUNTER — Encounter (INDEPENDENT_AMBULATORY_CARE_PROVIDER_SITE_OTHER): Payer: 59

## 2017-09-30 ENCOUNTER — Ambulatory Visit (INDEPENDENT_AMBULATORY_CARE_PROVIDER_SITE_OTHER): Payer: 59 | Admitting: Internal Medicine

## 2017-09-30 VITALS — BP 128/78 | HR 72 | Temp 97.9°F | Ht 62.0 in | Wt 216.0 lb

## 2017-09-30 DIAGNOSIS — Z0000 Encounter for general adult medical examination without abnormal findings: Secondary | ICD-10-CM

## 2017-09-30 DIAGNOSIS — E538 Deficiency of other specified B group vitamins: Secondary | ICD-10-CM

## 2017-09-30 DIAGNOSIS — E559 Vitamin D deficiency, unspecified: Secondary | ICD-10-CM

## 2017-09-30 DIAGNOSIS — R7989 Other specified abnormal findings of blood chemistry: Secondary | ICD-10-CM | POA: Diagnosis not present

## 2017-09-30 DIAGNOSIS — I10 Essential (primary) hypertension: Secondary | ICD-10-CM | POA: Diagnosis not present

## 2017-09-30 LAB — CBC WITH DIFFERENTIAL/PLATELET
Basophils Absolute: 0 10*3/uL (ref 0.0–0.1)
Basophils Relative: 0.9 % (ref 0.0–3.0)
Eosinophils Absolute: 0.1 10*3/uL (ref 0.0–0.7)
Eosinophils Relative: 2.1 % (ref 0.0–5.0)
HCT: 40.3 % (ref 36.0–46.0)
Hemoglobin: 13.2 g/dL (ref 12.0–15.0)
LYMPHS ABS: 1.1 10*3/uL (ref 0.7–4.0)
Lymphocytes Relative: 37 % (ref 12.0–46.0)
MCHC: 32.7 g/dL (ref 30.0–36.0)
MCV: 80.2 fl (ref 78.0–100.0)
MONOS PCT: 10.4 % (ref 3.0–12.0)
Monocytes Absolute: 0.3 10*3/uL (ref 0.1–1.0)
NEUTROS ABS: 1.5 10*3/uL (ref 1.4–7.7)
Neutrophils Relative %: 49.6 % (ref 43.0–77.0)
PLATELETS: 136 10*3/uL — AB (ref 150.0–400.0)
RBC: 5.02 Mil/uL (ref 3.87–5.11)
RDW: 15.4 % (ref 11.5–15.5)
WBC: 3.1 10*3/uL — ABNORMAL LOW (ref 4.0–10.5)

## 2017-09-30 LAB — LIPID PANEL
CHOL/HDL RATIO: 3
CHOLESTEROL: 180 mg/dL (ref 0–200)
HDL: 56.3 mg/dL (ref >39.00)
LDL CALC: 111 mg/dL — AB (ref 0–99)
NONHDL: 124.06
Triglycerides: 67 mg/dL (ref 0.0–149.0)
VLDL: 13.4 mg/dL (ref 0.0–40.0)

## 2017-09-30 LAB — IRON,TIBC AND FERRITIN PANEL
%SAT: 30 % (calc) (ref 11–50)
Ferritin: 33 ng/mL (ref 10–232)
IRON: 104 ug/dL (ref 40–190)
TIBC: 344 mcg/dL (calc) (ref 250–450)

## 2017-09-30 LAB — BASIC METABOLIC PANEL
BUN: 12 mg/dL (ref 6–23)
CALCIUM: 10.6 mg/dL — AB (ref 8.4–10.5)
CO2: 31 meq/L (ref 19–32)
Chloride: 99 mEq/L (ref 96–112)
Creatinine, Ser: 0.68 mg/dL (ref 0.40–1.20)
GFR: 118.95 mL/min (ref >60.00)
GLUCOSE: 89 mg/dL (ref 70–99)
Potassium: 3.9 mEq/L (ref 3.5–5.1)
SODIUM: 139 meq/L (ref 135–145)

## 2017-09-30 LAB — VITAMIN D 25 HYDROXY (VIT D DEFICIENCY, FRACTURES): VITD: 31.69 ng/mL (ref 30.00–100.00)

## 2017-09-30 LAB — HEPATIC FUNCTION PANEL
ALK PHOS: 52 U/L (ref 39–117)
ALT: 18 U/L (ref 0–35)
AST: 25 U/L (ref 0–37)
Albumin: 4.1 g/dL (ref 3.5–5.2)
Bilirubin, Direct: 0 mg/dL (ref 0.0–0.3)
TOTAL PROTEIN: 7.9 g/dL (ref 6.0–8.3)
Total Bilirubin: 0.3 mg/dL (ref 0.2–1.2)

## 2017-09-30 LAB — VITAMIN B12: Vitamin B-12: 1377 pg/mL — ABNORMAL HIGH (ref 211–911)

## 2017-09-30 LAB — TSH: TSH: 0.22 u[IU]/mL — ABNORMAL LOW (ref 0.35–4.50)

## 2017-09-30 MED ORDER — OLMESARTAN-AMLODIPINE-HCTZ 40-10-25 MG PO TABS: 1.0000 | ORAL_TABLET | Freq: Every day | ORAL | 3 refills | Status: DC

## 2017-09-30 NOTE — Assessment & Plan Note (Signed)
[facility]

## 2017-09-30 NOTE — Progress Notes (Signed)
Subjective:  Patient ID: [name], female    DOB: 06/09/1971  Age: 47 y.o. MRN: [id number]  CC: No chief complaint on file.   HPI Haevyn Ury presents for a well exam F/u HTN, anxiety  Outpatient Medications Prior to Visit  Medication Sig Dispense Refill  . ALPRAZolam (XANAX) 0.5 MG tablet Take 1-2 tablets (0.5-1 mg total) by mouth at bedtime. 3 months supply 180 tablet 1  . Ascorbic Acid (VITAMIN C) 100 MG tablet Take 200 mg by mouth 2 (two) times daily.      . Azelaic Acid (FINACEA) 15 % FOAM as needed.    . Bepotastine Besilate (BEPREVE) 1.5 % SOLN USE 1 DROP IN EACH EYE TWICE DAILY AS NEEDED FOR ITCHY EYES 10 mL 5  . cetirizine (ZYRTEC) 10 MG tablet Take 10 mg by mouth daily.    . Cholecalciferol (EQL VITAMIN D3) 1000 UNITS tablet Take 1,000 Units by mouth daily.      . Clocortolone Pivalate Pump 0.1 % CREA as needed.    . cyclobenzaprine (FLEXERIL) 10 MG tablet Take 1-2 tablets (10-20 mg total) by mouth at bedtime. Take 1-2 qhs 180 tablet 3  . ferrous sulfate 325 (65 FE) MG tablet Take 1 tablet (325 mg total) by mouth 2 (two) times daily. 180 tablet 3  . fluticasone (FLONASE) 50 MCG/ACT nasal spray SPRAY 2 SPRAYS INTO EACH NOSTRIL EVERY DAY 16 g 3  . furosemide (LASIX) 40 MG tablet Take 1 tablet (40 mg total) by mouth daily as needed. 30 tablet 1  . guaiFENesin (MUCINEX) 600 MG 12 hr tablet Take 1 tablet (600 mg total) by mouth 2 (two) times daily as needed for cough or to loosen phlegm. 14 tablet 0  . Lorcaserin HCl (BELVIQ) 10 MG TABS Take by mouth.    . mupirocin ointment (BACTROBAN) 2 % Use qd-bid 15 g 0  . naproxen (NAPROSYN) 500 MG tablet Take 1 tablet (500 mg total) by mouth 2 (two) times daily with a meal. Use prn 60 tablet 3  . Olmesartan-Amlodipine-HCTZ 40-10-25 MG TABS TAKE 1 TABLET BY MOUTH EVERY DAY 90 tablet 1  . ondansetron (ZOFRAN) 4 MG tablet Take 1 tablet (4 mg total) by mouth every 8 (eight) hours as needed for nausea or vomiting. 15 tablet 0  .  oxymetazoline (AFRIN NASAL SPRAY) 0.05 % nasal spray Place 1 spray into both nostrils 2 (two) times daily. Use only for 3days, then stop 30 mL 0  . pantoprazole (PROTONIX) 40 MG tablet Take 1 tablet (40 mg total) by mouth daily. 30 tablet 0  . Prenatal Vit-Fe Fumarate-FA (PNV PRENATAL PLUS MULTIVITAMIN) 27-1 MG TABS Take 1 tablet by mouth daily. 90 tablet 3  . sodium chloride (OCEAN) 0.65 % SOLN nasal spray Place 1 spray into both nostrils as needed for congestion. 15 mL 0   No facility-administered medications prior to visit.     ROS Review of Systems  Constitutional: Negative for activity change, appetite change, chills, fatigue and unexpected weight change.  HENT: Negative for congestion, mouth sores and sinus pressure.   Eyes: Negative for visual disturbance.  Respiratory: Negative for cough and chest tightness.   Gastrointestinal: Negative for abdominal pain and nausea.  Genitourinary: Negative for difficulty urinating, frequency and vaginal pain.  Musculoskeletal: Negative for back pain and gait problem.  Skin: Negative for pallor and rash.  Neurological: Negative for dizziness, tremors, weakness, numbness and headaches.  Psychiatric/Behavioral: Negative for confusion and sleep disturbance.    Objective:  BP 128/78 (  BP Location: Left Arm, Patient Position: Sitting, Cuff Size: Large)   Pulse 72   Temp 97.9 F (36.6 C) (Oral)   Ht 5\' 2"  (1.575 m)   Wt 216 lb (98 kg)   SpO2 99%   BMI 39.51 kg/m   BP Readings from Last 3 Encounters:  09/30/17 128/78  09/03/17 134/76  08/24/17 106/65    Wt Readings from Last 3 Encounters:  09/30/17 216 lb (98 kg)  09/03/17 226 lb (102.5 kg)  08/24/17 220 lb (99.8 kg)    Physical Exam  Constitutional: She appears well-developed. No distress.  HENT:  Head: Normocephalic.  Right Ear: External ear normal.  Left Ear: External ear normal.  Nose: Nose normal.  Mouth/Throat: Oropharynx is clear and moist.  Eyes: Conjunctivae are normal.  Pupils are equal, round, and reactive to light. Right eye exhibits no discharge. Left eye exhibits no discharge.  Neck: Normal range of motion. Neck supple. No JVD present. No tracheal deviation present. No thyromegaly present.  Cardiovascular: Normal rate, regular rhythm and normal heart sounds.  Pulmonary/Chest: No stridor. No respiratory distress. She has no wheezes.  Abdominal: Soft. Bowel sounds are normal. She exhibits no distension and no mass. There is no tenderness. There is no rebound and no guarding.  Musculoskeletal: She exhibits no edema or tenderness.  Lymphadenopathy:    She has no cervical adenopathy.  Neurological: She displays normal reflexes. No cranial nerve deficit. She exhibits normal muscle tone. Coordination normal.  Skin: No rash noted. No erythema.  Psychiatric: She has a normal mood and affect. Her behavior is normal. Judgment and thought content normal.  obese  Lab Results  Component Value Date   WBC 5.1 08/24/2017   HGB 12.6 08/24/2017   HCT 38.6 08/24/2017   PLT 138 (L) 08/24/2017   GLUCOSE 100 (H) 08/24/2017   CHOL 180 04/21/2016   TRIG 104.0 04/21/2016   HDL 53.50 04/21/2016   LDLCALC 106 (H) 04/21/2016   ALT 12 10/23/2016   AST 18 10/23/2016   NA 140 08/24/2017   K 3.4 (L) 08/24/2017   CL 104 08/24/2017   CREATININE 0.52 08/24/2017   BUN 9 08/24/2017   CO2 29 08/24/2017   TSH 0.59 06/30/2017   INR 0.9 RATIO 03/15/2007   HGBA1C 6.1 10/23/2016    Dg Chest 2 View  Result Date: 08/24/2017 CLINICAL DATA:  Chest pain. EXAM: CHEST  2 VIEW COMPARISON:  None. FINDINGS: Normal cardiac silhouette and mediastinal contours. There is mild thickening of the pulmonary interstitium, most conspicuous about the bilateral pulmonary hila. No discrete focal airspace opacities. No pleural effusion or pneumothorax. No evidence of edema. No acute osseus abnormalities. IMPRESSION: Findings suggestive of airways disease / bronchitis. No focal airspace opacities to suggest  pneumonia. Electronically Signed   By: Sandi Mariscal M.D.   On: 08/24/2017 09:23    Assessment & Plan:   There are no diagnoses linked to this encounter. I am having [name] maintain her vitamin C, Cholecalciferol, ferrous sulfate, Clocortolone Pivalate Pump, Azelaic Acid, Bepotastine Besilate, cetirizine, pantoprazole, ondansetron, furosemide, PNV PRENATAL PLUS MULTIVITAMIN, Lorcaserin HCl, mupirocin ointment, naproxen, oxymetazoline, sodium chloride, guaiFENesin, ALPRAZolam, cyclobenzaprine, fluticasone, and Olmesartan-amLODIPine-HCTZ.  No orders of the defined types were placed in this encounter.    Follow-up: No Follow-up on file.  Walker Kehr, MD

## 2017-09-30 NOTE — Assessment & Plan Note (Signed)
Labs

## 2017-09-30 NOTE — Assessment & Plan Note (Signed)
Wt Readings from Last 3 Encounters:  09/30/17 216 lb (98 kg)  09/03/17 226 lb (102.5 kg)  08/24/17 220 lb (99.8 kg)  On diet

## 2017-09-30 NOTE — Assessment & Plan Note (Signed)
Vit D 

## 2017-09-30 NOTE — Assessment & Plan Note (Signed)
Come back for lab work in 6 weeks.

## 2017-09-30 NOTE — Assessment & Plan Note (Addendum)
We discussed age appropriate health related issues, including available/recomended screening tests and vaccinations. We discussed a need for adhering to healthy diet and exercise. Labs were ordered to be later reviewed . All questions were answered.  Dr Ouida Sills for PAP

## 2017-10-01 DIAGNOSIS — Z01419 Encounter for gynecological examination (general) (routine) without abnormal findings: Secondary | ICD-10-CM | POA: Diagnosis not present

## 2017-10-01 DIAGNOSIS — Z1231 Encounter for screening mammogram for malignant neoplasm of breast: Secondary | ICD-10-CM | POA: Diagnosis not present

## 2017-10-01 DIAGNOSIS — N926 Irregular menstruation, unspecified: Secondary | ICD-10-CM | POA: Diagnosis not present

## 2017-10-01 DIAGNOSIS — Z124 Encounter for screening for malignant neoplasm of cervix: Secondary | ICD-10-CM | POA: Diagnosis not present

## 2017-10-07 ENCOUNTER — Ambulatory Visit: Payer: 59

## 2017-10-07 ENCOUNTER — Encounter (INDEPENDENT_AMBULATORY_CARE_PROVIDER_SITE_OTHER): Payer: Self-pay | Admitting: Family Medicine

## 2017-10-07 ENCOUNTER — Ambulatory Visit (INDEPENDENT_AMBULATORY_CARE_PROVIDER_SITE_OTHER): Payer: 59 | Admitting: Family Medicine

## 2017-10-07 VITALS — BP 97/66 | HR 69 | Temp 97.9°F | Ht 63.0 in | Wt 215.0 lb

## 2017-10-07 DIAGNOSIS — R5383 Other fatigue: Secondary | ICD-10-CM

## 2017-10-07 DIAGNOSIS — Z6838 Body mass index (BMI) 38.0-38.9, adult: Secondary | ICD-10-CM

## 2017-10-07 DIAGNOSIS — I10 Essential (primary) hypertension: Secondary | ICD-10-CM | POA: Diagnosis not present

## 2017-10-07 DIAGNOSIS — Z0289 Encounter for other administrative examinations: Secondary | ICD-10-CM

## 2017-10-07 DIAGNOSIS — R0602 Shortness of breath: Secondary | ICD-10-CM | POA: Diagnosis not present

## 2017-10-07 DIAGNOSIS — Z9189 Other specified personal risk factors, not elsewhere classified: Secondary | ICD-10-CM

## 2017-10-07 DIAGNOSIS — Z1331 Encounter for screening for depression: Secondary | ICD-10-CM | POA: Diagnosis not present

## 2017-10-07 NOTE — Progress Notes (Signed)
Office: [phone]  /  Fax: [phone]   Dear Dr. Alain Marion,   Thank you for referring [name] to our clinic. The following note includes my evaluation and treatment recommendations.  HPI:   Chief Complaint: OBESITY    [name] has been referred by Evie Lacks. Plotnikov, MD for consultation regarding her obesity and obesity related comorbidities.    [name] (MR# [id number]) is a 47 y.o. female who presents on 10/07/2017 for obesity evaluation and treatment. Current BMI is Body mass index is 38.09 kg/m.Marland Kitchen [place] has been struggling with her weight for many years and has been unsuccessful in either losing weight, maintaining weight loss, or reaching her healthy weight goal.        [name] works a widely variety of shifts, daytime, nighttime, 8-12 hours 5 to 6 days a week.      [name] attended our information session and states she is currently in the action stage of change and ready to dedicate time achieving and maintaining a healthier weight. [name] is interested in becoming our patient and working on intensive lifestyle modifications including (but not limited to) diet, exercise and weight loss.    [name] states her family eats meals together she thinks her family will eat healthier with  her her desired weight loss is 60-65 lbs she has been heavy most of  her life she started gaining weight in her late 20's early 30's her heaviest weight ever was 240 lbs she has significant food cravings issues  she snacks frequently in the evenings she skips meals frequently she is frequently drinking liquids with calories she frequently eats larger portions than normal  she struggles with emotional eating    [name] feels her energy is lower than it should be. This has worsened with weight gain and has not worsened recently. [name] admits to daytime somnolence and  admits to waking up still tired. Patient is at risk for obstructive sleep apnea. Patent has  a history of symptoms of daytime Vanessa. Patient generally gets 5 hours of sleep per night, and states they generally have generally restful sleep. Snoring is present. Apneic episodes are not present. Epworth Sleepiness Score is 7.  Dyspnea on exertion [name] notes increasing shortness of breath with exercising and seems to be worsening over time with weight gain. She notes getting out of breath sooner with activity than she used to. This has not gotten worse recently. [name] denies orthopnea.  Hypertension [name] is a 47 y.o. female with hypertension. [name]'s blood pressure is well controlled and she denies chest pain, headache, or lightheadedness. She is working weight loss to help control her blood pressure with the goal of decreasing her risk of heart attack and stroke.   At risk for cardiovascular disease [name] is at a higher than average risk for cardiovascular disease due to obesity and hypertension. She currently denies any chest pain.  Depression Screen [name]'s Food and Mood (modified PHQ-9) score was  Depression screen PHQ 2/9 10/07/2017  Decreased Interest 3  Down, Depressed, Hopeless 1  PHQ - 2 Score 4  Altered sleeping 2  Tired, decreased energy 3  Change in appetite 2  Feeling bad or failure about yourself  0  Trouble concentrating 1  Moving slowly or fidgety/restless 0  Suicidal thoughts 0  PHQ-9 Score 12    ALLERGIES: No Known Allergies  MEDICATIONS: Current Outpatient Medications on File Prior to Visit  Medication Sig Dispense Refill  . ALPRAZolam (XANAX) 0.5 MG tablet Take 1-2 tablets (0.5-1  mg total) by mouth at bedtime. 3 months supply 180 tablet 1  . cetirizine (ZYRTEC) 10 MG tablet Take 10 mg by mouth daily.    . Cholecalciferol (EQL VITAMIN D3) 1000 UNITS tablet Take 1,000 Units by mouth daily.      . cyclobenzaprine (FLEXERIL) 10 MG tablet Take 1-2 tablets (10-20 mg total) by mouth at bedtime. Take 1-2 qhs 180 tablet 3  . fluticasone  (FLONASE) 50 MCG/ACT nasal spray SPRAY 2 SPRAYS INTO EACH NOSTRIL EVERY DAY 16 g 3  . naproxen (NAPROSYN) 500 MG tablet Take 1 tablet (500 mg total) by mouth 2 (two) times daily with a meal. Use prn 60 tablet 3  . Olmesartan-amLODIPine-HCTZ 40-10-25 MG TABS Take 1 tablet by mouth daily. 90 tablet 3  . oxymetazoline (AFRIN NASAL SPRAY) 0.05 % nasal spray Place 1 spray into both nostrils 2 (two) times daily. Use only for 3days, then stop 30 mL 0  . Prenatal Vit-Fe Fumarate-FA (PNV PRENATAL PLUS MULTIVITAMIN) 27-1 MG TABS Take 1 tablet by mouth daily. 90 tablet 3   No current facility-administered medications on file prior to visit.     PAST MEDICAL HISTORY: Past Medical History:  Diagnosis Date  . Allergy    rhinitis  . Anemia    iron deficiency  . Anemia, unspecified   . Asthma    asthma  . Colitis   . Fibromyalgia   . Hypertension   . Iron deficiency anemia, unspecified   . Leiomyoma of uterus, unspecified   . Leiomyoma of uterus, unspecified   . Meralgia paresthetica   . Nausea alone   . Obesity   . Other B-complex deficiencies   . Umbilical hernia   . Unspecified vitamin D deficiency     PAST SURGICAL HISTORY: Past Surgical History:  Procedure Laterality Date  . fibriodidectomy  2009  . MYOMECTOMY  2004   x 2    SOCIAL HISTORY: Social History   Tobacco Use  . Smoking status: Never Smoker  . Smokeless tobacco: Never Used  Substance Use Topics  . Alcohol use: No  . Drug use: No    FAMILY HISTORY: Family History  Problem Relation Age of Onset  . Hypertension Other   . High blood pressure Father   . Alcoholism Father     ROS: Review of Systems  Constitutional: Positive for malaise/Vanessa. Negative for weight loss.  HENT:       + Hay fever  Eyes:       + Wear glasses or contacts  Respiratory: Positive for shortness of breath (with exertion).   Cardiovascular: Negative for chest pain and orthopnea.  Musculoskeletal:       + Muscle stiffness    Neurological: Negative for headaches.       Negative lightheadedness  Psychiatric/Behavioral: Positive for depression. Negative for suicidal ideas.       + Stress    PHYSICAL EXAM: Blood pressure 97/66, pulse 69, temperature 97.9 F (36.6 C), temperature source Oral, height 5\' 3"  (1.6 m), weight 215 lb (97.5 kg), last menstrual period 09/17/2017, SpO2 98 %. Body mass index is 38.09 kg/m. Physical Exam  Constitutional: She is oriented to person, place, and time. She appears well-developed and well-nourished.  HENT:  Head: Normocephalic and atraumatic.  Nose: Nose normal.  Eyes: EOM are normal. No scleral icterus.  Neck: Normal range of motion. Neck supple. No thyromegaly present.  Cardiovascular: Normal rate and regular rhythm.  Pulmonary/Chest: Effort normal. No respiratory distress.  Abdominal: Soft. There is no  tenderness.  + Obesity  Musculoskeletal:  Range of Motion normal in all 4 extremities Trace edema noted in bilateral lower extremities  Neurological: She is alert and oriented to person, place, and time. Coordination normal.  Skin: Skin is warm and dry.  Psychiatric: She has a normal mood and affect. Her behavior is normal.  Vitals reviewed.   RECENT LABS AND TESTS: BMET    Component Value Date/Time   NA 139 09/30/2017 0938   K 3.9 09/30/2017 0938   CL 99 09/30/2017 0938   CO2 31 09/30/2017 0938   GLUCOSE 89 09/30/2017 0938   BUN 12 09/30/2017 0938   CREATININE 0.68 09/30/2017 0938   CALCIUM 10.6 (H) 09/30/2017 0938   GFRNONAA >60 08/24/2017 0922   GFRAA >60 08/24/2017 0922   Lab Results  Component Value Date   HGBA1C 6.1 10/23/2016   No results found for: INSULIN CBC    Component Value Date/Time   WBC 3.1 (L) 09/30/2017 0938   RBC 5.02 09/30/2017 0938   HGB 13.2 09/30/2017 0938   HCT 40.3 09/30/2017 0938   PLT 136.0 (L) 09/30/2017 0938   MCV 80.2 09/30/2017 0938   MCH 26.3 08/24/2017 0922   MCHC 32.7 09/30/2017 0938   RDW 15.4 09/30/2017 0938    LYMPHSABS 1.1 09/30/2017 0938   MONOABS 0.3 09/30/2017 0938   EOSABS 0.1 09/30/2017 0938   BASOSABS 0.0 09/30/2017 0938   Iron/TIBC/Ferritin/ %Sat    Component Value Date/Time   IRON 104 09/30/2017 0938   TIBC 344 09/30/2017 0938   FERRITIN 33 09/30/2017 0938   IRONPCTSAT 30 09/30/2017 0938   Lipid Panel     Component Value Date/Time   CHOL 180 09/30/2017 0938   TRIG 67.0 09/30/2017 0938   HDL 56.30 09/30/2017 0938   CHOLHDL 3 09/30/2017 0938   VLDL 13.4 09/30/2017 0938   LDLCALC 111 (H) 09/30/2017 0938   Hepatic Function Panel     Component Value Date/Time   PROT 7.9 09/30/2017 0938   ALBUMIN 4.1 09/30/2017 0938   AST 25 09/30/2017 0938   ALT 18 09/30/2017 0938   ALKPHOS 52 09/30/2017 0938   BILITOT 0.3 09/30/2017 0938   BILIDIR 0.0 09/30/2017 0938   IBILI 0.1 (L) 10/21/2012 2000      Component Value Date/Time   TSH 0.22 (L) 09/30/2017 0938   TSH 0.59 06/30/2017 0854   TSH 0.25 (L) 10/23/2016 0838    ECG  shows NSR with a rate of 73 BPM INDIRECT CALORIMETER done today shows a VO2 of 229 and a REE of 1594.  Her calculated basal metabolic rate is 9983 thus her basal metabolic rate is worse than expected.    ASSESSMENT AND PLAN: Other Vanessa - Plan: EKG 12-Lead, Comprehensive metabolic panel, Hemoglobin A1c, Insulin, random, T3, T4, free, TSH  Shortness of breath on exertion  Essential hypertension  Depression screening  At risk for heart disease  Class 2 severe obesity with serious comorbidity and body mass index (BMI) of 38.0 to 38.9 in adult, unspecified obesity type ([name])  PLAN:  Vanessa [name] was informed that her Vanessa may be related to obesity, depression or many other causes. Labs will be ordered, and in the meanwhile [name] has agreed to work on diet, exercise and weight loss to help with Vanessa. Proper sleep hygiene was discussed including the need for 7-8 hours of quality sleep each night. A sleep study was not ordered based on  symptoms and Epworth score.  Dyspnea on exertion [name]'s shortness of breath  appears to be obesity related and exercise induced. She has agreed to work on weight loss and gradually increase exercise to treat her exercise induced shortness of breath. If [name] follows our instructions and loses weight without improvement of her shortness of breath, we will plan to refer to pulmonology. We will monitor this condition regularly. [name] agrees to this plan.  Hypertension We discussed sodium restriction, working on healthy weight loss, and a regular exercise program as the means to achieve improved blood pressure control. [name] agreed with this plan and agreed to follow up as directed. We will continue to monitor her blood pressure as well as her progress with the above lifestyle modifications. She will continue diet and exercise and will watch for signs of hypotension as she continues her lifestyle modifications. We will check labs and [name] agrees to follow up with our clinic in 2 weeks and we will recheck blood pressure at that time.  Cardiovascular risk counselling [name] was given extended (15 minutes) coronary artery disease prevention counseling today. She is 47 y.o. female and has risk factors for heart disease including obesity and hypertension. We discussed intensive lifestyle modifications today with an emphasis on specific weight loss instructions and strategies. Pt was also informed of the importance of increasing exercise and decreasing saturated fats to help prevent heart disease.  Depression Screen Conny had a moderately positive depression screening. Depression is commonly associated with obesity and often results in emotional eating behaviors. We will monitor this closely and work on CBT to help improve the non-hunger eating patterns. Referral to Psychology may be required if no improvement is seen as she continues in our clinic.  Obesity [name] is currently in the action  stage of change and her goal is to continue with weight loss efforts. I recommend [name] begin the structured treatment plan as follows:  She has agreed to follow the Category 2 plan Madysen has been instructed to eventually work up to a goal of 150 minutes of combined cardio and strengthening exercise per week for weight loss and overall health benefits. We discussed the following Behavioral Modification Strategies today: increasing lean protein intake, decreasing simple carbohydrates  and work on meal planning and easy cooking plans   She was informed of the importance of frequent follow up visits to maximize her success with intensive lifestyle modifications for her multiple health conditions. She was informed we would discuss her lab results at her next visit unless there is a critical issue that needs to be addressed sooner. Amaia agreed to keep her next visit at the agreed upon time to discuss these results.    OBESITY BEHAVIORAL INTERVENTION VISIT  Today's visit was # 1 out of 22.  Starting weight: 215 lbs Starting date: 10/07/17 Today's weight : 215 lbs Today's date: 10/07/2017 Total lbs lost to date: 0 (Patients must lose 7 lbs in the first 6 months to continue with counseling)   ASK: We discussed the diagnosis of obesity with [name] today and Kalsey agreed to give [place] permission to discuss obesity behavioral modification therapy today.  ASSESS: Camilla has the diagnosis of obesity and her BMI today is 38.09 Hadlei is in the action stage of change   ADVISE: Marissah was educated on the multiple health risks of obesity as well as the benefit of weight loss to improve her health. She was advised of the need for long term treatment and the importance of lifestyle modifications.  AGREE: Multiple dietary modification options and treatment options were discussed and  Kyndel agreed to the above obesity treatment plan.   I, Trixie Dredge, am acting as transcriptionist  for Dennard Nip, MD  I have reviewed the above documentation for accuracy and completeness, and I agree with the above. -Dennard Nip, MD

## 2017-10-08 LAB — COMPREHENSIVE METABOLIC PANEL
ALK PHOS: 58 IU/L (ref 39–117)
ALT: 19 IU/L (ref 0–32)
AST: 25 IU/L (ref 0–40)
Albumin/Globulin Ratio: 1.6 (ref 1.2–2.2)
Albumin: 4.4 g/dL (ref 3.5–5.5)
BUN/Creatinine Ratio: 14 (ref 9–23)
BUN: 8 mg/dL (ref 6–24)
Bilirubin Total: 0.2 mg/dL (ref 0.0–1.2)
CO2: 27 mmol/L (ref 20–29)
CREATININE: 0.57 mg/dL (ref 0.57–1.00)
Calcium: 9.7 mg/dL (ref 8.7–10.2)
Chloride: 100 mmol/L (ref 96–106)
GFR calc Af Amer: 128 mL/min/{1.73_m2} (ref >59)
GFR calc non Af Amer: 111 mL/min/{1.73_m2} (ref >59)
GLUCOSE: 88 mg/dL (ref 65–99)
Globulin, Total: 2.8 g/dL (ref 1.5–4.5)
Potassium: 4.1 mmol/L (ref 3.5–5.2)
Sodium: 140 mmol/L (ref 134–144)
Total Protein: 7.2 g/dL (ref 6.0–8.5)

## 2017-10-08 LAB — T3: T3 TOTAL: 92 ng/dL (ref 71–180)

## 2017-10-08 LAB — TSH: TSH: 0.444 u[IU]/mL — ABNORMAL LOW (ref 0.450–4.500)

## 2017-10-08 LAB — HEMOGLOBIN A1C
ESTIMATED AVERAGE GLUCOSE: 120 mg/dL
HEMOGLOBIN A1C: 5.8 % — AB (ref 4.8–5.6)

## 2017-10-08 LAB — T4, FREE: FREE T4: 1.13 ng/dL (ref 0.82–1.77)

## 2017-10-08 LAB — INSULIN, RANDOM: INSULIN: 7.8 u[IU]/mL (ref 2.6–24.9)

## 2017-10-11 ENCOUNTER — Encounter (INDEPENDENT_AMBULATORY_CARE_PROVIDER_SITE_OTHER): Payer: Self-pay | Admitting: Family Medicine

## 2017-10-13 ENCOUNTER — Ambulatory Visit (INDEPENDENT_AMBULATORY_CARE_PROVIDER_SITE_OTHER): Payer: 59 | Admitting: *Deleted

## 2017-10-13 DIAGNOSIS — Z23 Encounter for immunization: Secondary | ICD-10-CM | POA: Diagnosis not present

## 2017-10-15 ENCOUNTER — Encounter: Payer: Self-pay | Admitting: *Deleted

## 2017-10-15 LAB — TB SKIN TEST
Induration: 0 mm
TB Skin Test: NEGATIVE

## 2017-10-21 ENCOUNTER — Ambulatory Visit (INDEPENDENT_AMBULATORY_CARE_PROVIDER_SITE_OTHER): Payer: 59 | Admitting: Family Medicine

## 2017-10-21 VITALS — BP 107/68 | HR 64 | Temp 97.8°F | Ht 63.0 in | Wt 214.0 lb

## 2017-10-21 DIAGNOSIS — R7303 Prediabetes: Secondary | ICD-10-CM | POA: Diagnosis not present

## 2017-10-21 DIAGNOSIS — Z9189 Other specified personal risk factors, not elsewhere classified: Secondary | ICD-10-CM

## 2017-10-21 DIAGNOSIS — Z6837 Body mass index (BMI) 37.0-37.9, adult: Secondary | ICD-10-CM | POA: Diagnosis not present

## 2017-10-21 MED ORDER — METFORMIN HCL 500 MG PO TABS: 500.0000 mg | ORAL_TABLET | Freq: Every day | ORAL | 0 refills | Status: DC

## 2017-10-21 NOTE — Progress Notes (Signed)
Office: [phone]  /  Fax: [phone]   HPI:   Chief Complaint: OBESITY [name] is here to discuss her progress with her obesity treatment plan. She is on the Category 2 plan and is following her eating plan approximately 60 % of the time. She states she is exercising 0 minutes 0 times per week. [name] states she struggled to follow her plan closely especially with changing shifts. She liked the microwave meal option. Hunger was controlled while she ate all her food.  Her weight is 214 lb (97.1 kg) today and has had a weight loss of 1 pound over a period of 2 weeks since her last visit. She has lost 1 lb since starting treatment with [place].  Pre-Diabetes [name] has a diagnosis of pre-diabetes based on her elevated Hgb A1c and was informed this puts her at greater risk of developing diabetes. Her A1c elevated at 5.8, she notes polyphagia and increase central weight gain. She is not taking metformin currently and continues to work on diet and exercise but struggling. She denies nausea or hypoglycemia.  At risk for diabetes [name] is at higher than average risk for developing diabetes due to her obesity and pre-diabetes. She currently denies polyuria or polydipsia.  ALLERGIES: No Known Allergies  MEDICATIONS: Current Outpatient Medications on File Prior to Visit  Medication Sig Dispense Refill  . ALPRAZolam (XANAX) 0.5 MG tablet Take 1-2 tablets (0.5-1 mg total) by mouth at bedtime. 3 months supply 180 tablet 1  . cetirizine (ZYRTEC) 10 MG tablet Take 10 mg by mouth daily.    . Cholecalciferol (EQL VITAMIN D3) 1000 UNITS tablet Take 1,000 Units by mouth daily.      . cyclobenzaprine (FLEXERIL) 10 MG tablet Take 1-2 tablets (10-20 mg total) by mouth at bedtime. Take 1-2 qhs 180 tablet 3  . fluticasone (FLONASE) 50 MCG/ACT nasal spray SPRAY 2 SPRAYS INTO EACH NOSTRIL EVERY DAY 16 g 3  . naproxen (NAPROSYN) 500 MG tablet Take 1 tablet (500 mg total) by mouth 2 (two) times daily with a  meal. Use prn 60 tablet 3  . Olmesartan-amLODIPine-HCTZ 40-10-25 MG TABS Take 1 tablet by mouth daily. 90 tablet 3  . oxymetazoline (AFRIN NASAL SPRAY) 0.05 % nasal spray Place 1 spray into both nostrils 2 (two) times daily. Use only for 3days, then stop 30 mL 0  . Prenatal Vit-Fe Fumarate-FA (PNV PRENATAL PLUS MULTIVITAMIN) 27-1 MG TABS Take 1 tablet by mouth daily. 90 tablet 3   No current facility-administered medications on file prior to visit.     PAST MEDICAL HISTORY: Past Medical History:  Diagnosis Date  . Allergy    rhinitis  . Anemia    iron deficiency  . Anemia, unspecified   . Asthma    asthma  . Colitis   . Fibromyalgia   . Hypertension   . Iron deficiency anemia, unspecified   . Leiomyoma of uterus, unspecified   . Leiomyoma of uterus, unspecified   . Meralgia paresthetica   . Nausea alone   . Obesity   . Other B-complex deficiencies   . Umbilical hernia   . Unspecified vitamin D deficiency     PAST SURGICAL HISTORY: Past Surgical History:  Procedure Laterality Date  . fibriodidectomy  2009  . MYOMECTOMY  2004   x 2    SOCIAL HISTORY: Social History   Tobacco Use  . Smoking status: Never Smoker  . Smokeless tobacco: Never Used  Substance Use Topics  . Alcohol use: No  .  Drug use: No    FAMILY HISTORY: Family History  Problem Relation Age of Onset  . Hypertension Other   . High blood pressure Father   . Alcoholism Father     ROS: Review of Systems  Constitutional: Positive for weight loss.  Gastrointestinal: Negative for nausea.  Genitourinary: Negative for frequency.  Endo/Heme/Allergies: Negative for polydipsia.       Positive polyphagia Negative hypoglycemia    PHYSICAL EXAM: Blood pressure 107/68, pulse 64, temperature 97.8 F (36.6 C), temperature source Oral, height 5\' 3"  (1.6 m), weight 214 lb (97.1 kg), SpO2 99 %. Body mass index is 37.91 kg/m. Physical Exam  Constitutional: She is oriented to person, place, and time. She  appears well-developed and well-nourished.  Cardiovascular: Normal rate.  Pulmonary/Chest: Effort normal.  Musculoskeletal: Normal range of motion.  Neurological: She is oriented to person, place, and time.  Skin: Skin is warm and dry.  Psychiatric: She has a normal mood and affect. Her behavior is normal.  Vitals reviewed.   RECENT LABS AND TESTS: BMET    Component Value Date/Time   NA 140 10/07/2017 1011   K 4.1 10/07/2017 1011   CL 100 10/07/2017 1011   CO2 27 10/07/2017 1011   GLUCOSE 88 10/07/2017 1011   GLUCOSE 89 09/30/2017 0938   BUN 8 10/07/2017 1011   CREATININE 0.57 10/07/2017 1011   CALCIUM 9.7 10/07/2017 1011   GFRNONAA 111 10/07/2017 1011   GFRAA 128 10/07/2017 1011   Lab Results  Component Value Date   HGBA1C 5.8 (H) 10/07/2017   HGBA1C 6.1 10/23/2016   HGBA1C 5.8 04/21/2016   HGBA1C 5.7 10/10/2014   HGBA1C 5.8 09/20/2012   Lab Results  Component Value Date   INSULIN 7.8 10/07/2017   CBC    Component Value Date/Time   WBC 3.1 (L) 09/30/2017 0938   RBC 5.02 09/30/2017 0938   HGB 13.2 09/30/2017 0938   HCT 40.3 09/30/2017 0938   PLT 136.0 (L) 09/30/2017 0938   MCV 80.2 09/30/2017 0938   MCH 26.3 08/24/2017 0922   MCHC 32.7 09/30/2017 0938   RDW 15.4 09/30/2017 0938   LYMPHSABS 1.1 09/30/2017 0938   MONOABS 0.3 09/30/2017 0938   EOSABS 0.1 09/30/2017 0938   BASOSABS 0.0 09/30/2017 0938   Iron/TIBC/Ferritin/ %Sat    Component Value Date/Time   IRON 104 09/30/2017 0938   TIBC 344 09/30/2017 0938   FERRITIN 33 09/30/2017 0938   IRONPCTSAT 30 09/30/2017 0938   Lipid Panel     Component Value Date/Time   CHOL 180 09/30/2017 0938   TRIG 67.0 09/30/2017 0938   HDL 56.30 09/30/2017 0938   CHOLHDL 3 09/30/2017 0938   VLDL 13.4 09/30/2017 0938   LDLCALC 111 (H) 09/30/2017 0938   Hepatic Function Panel     Component Value Date/Time   PROT 7.2 10/07/2017 1011   ALBUMIN 4.4 10/07/2017 1011   AST 25 10/07/2017 1011   ALT 19 10/07/2017 1011     ALKPHOS 58 10/07/2017 1011   BILITOT 0.2 10/07/2017 1011   BILIDIR 0.0 09/30/2017 0938   IBILI 0.1 (L) 10/21/2012 2000      Component Value Date/Time   TSH 0.444 (L) 10/07/2017 1011   TSH 0.22 (L) 09/30/2017 0938   TSH 0.59 06/30/2017 0854    ASSESSMENT AND PLAN: Prediabetes - Plan: metFORMIN (GLUCOPHAGE) 500 MG tablet  At risk for diabetes mellitus  Class 2 severe obesity with serious comorbidity and body mass index (BMI) of 37.0 to 37.9 in adult,  unspecified obesity type [facility])  PLAN:  Pre-Diabetes Kawena will continue to work on weight loss, diet, exercise, and decreasing simple carbohydrates in her diet to help decrease the risk of diabetes. We dicussed metformin including benefits and risks. She was informed that eating too many simple carbohydrates or too many calories at one sitting increases the likelihood of GI side effects. [name] agrees to start metformin 500 mg q AM #30 with no refills. [name] agrees to follow up with our clinic in 2 weeks as directed to monitor her progress.  Diabetes risk counselling [name] was given extended (30 minutes) diabetes prevention counseling today. She is 47 y.o. female and has risk factors for diabetes including obesity and pre-diabetes. We discussed intensive lifestyle modifications today with an emphasis on weight loss as well as increasing exercise and decreasing simple carbohydrates in her diet.  Obesity [name] is currently in the action stage of change. As such, her goal is to continue with weight loss efforts She has agreed to follow the Category 2 plan [name] has been instructed to work up to a goal of 150 minutes of combined cardio and strengthening exercise per week for weight loss and overall health benefits. We discussed the following Behavioral Modification Strategies today: increasing lean protein intake, decreasing simple carbohydrates  and work on meal planning and easy cooking plans   [name] has agreed to follow up  with our clinic in 2 weeks. She was informed of the importance of frequent follow up visits to maximize her success with intensive lifestyle modifications for her multiple health conditions.   OBESITY BEHAVIORAL INTERVENTION VISIT  Today's visit was # 2 out of 22.  Starting weight: 215 lbs Starting date: 10/07/17 Today's weight : 214 lbs  Today's date: 10/21/2017 Total lbs lost to date: 1 (Patients must lose 7 lbs in the first 6 months to continue with counseling)   ASK: We discussed the diagnosis of obesity with [name] today and [name] agreed to give [place] permission to discuss obesity behavioral modification therapy today.  ASSESS: [name] has the diagnosis of obesity and her BMI today is 37.92 [name] is in the action stage of change   ADVISE: [name] was educated on the multiple health risks of obesity as well as the benefit of weight loss to improve her health. She was advised of the need for long term treatment and the importance of lifestyle modifications.  AGREE: Multiple dietary modification options and treatment options were discussed and  [name] agreed to the above obesity treatment plan.  [name], am acting as transcriptionist for Dennard Nip, MD

## 2017-11-03 ENCOUNTER — Ambulatory Visit (INDEPENDENT_AMBULATORY_CARE_PROVIDER_SITE_OTHER): Payer: 59 | Admitting: Physician Assistant

## 2017-11-03 VITALS — BP 102/69 | HR 71 | Temp 98.5°F | Ht 63.0 in | Wt 216.0 lb

## 2017-11-03 DIAGNOSIS — R7989 Other specified abnormal findings of blood chemistry: Secondary | ICD-10-CM | POA: Diagnosis not present

## 2017-11-03 DIAGNOSIS — Z6838 Body mass index (BMI) 38.0-38.9, adult: Secondary | ICD-10-CM

## 2017-11-03 NOTE — Progress Notes (Signed)
Office: [phone]  /  Fax: [phone]   HPI:   Chief Complaint: OBESITY [name] is here to discuss her progress with her obesity treatment plan. She is on the Category 2 plan and is following her eating plan approximately 70 % of the time. She states she is exercising 0 minutes 0 times per week. [name] rotates 3 shifts at work and finds it hard to follow the meal plan. She struggles with pre-planning her meals.  Her weight is 216 lb (98 kg) today and has gained 2 pounds since her last visit. She has lost 0 lbs since starting treatment with [place].  Low TSH [name]'s TSH is low on 2 occasions in the office . T3 and T4 are normal. She admits to palpitations, hot flashes, and fatigue. She denies dysphagia, lump on neck, or history of thyroid disease.   ALLERGIES: No Known Allergies  MEDICATIONS: Current Outpatient Medications on File Prior to Visit  Medication Sig Dispense Refill  . ALPRAZolam (XANAX) 0.5 MG tablet Take 1-2 tablets (0.5-1 mg total) by mouth at bedtime. 3 months supply 180 tablet 1  . cetirizine (ZYRTEC) 10 MG tablet Take 10 mg by mouth daily.    . Cholecalciferol (EQL VITAMIN D3) 1000 UNITS tablet Take 1,000 Units by mouth daily.      . cyclobenzaprine (FLEXERIL) 10 MG tablet Take 1-2 tablets (10-20 mg total) by mouth at bedtime. Take 1-2 qhs 180 tablet 3  . fluticasone (FLONASE) 50 MCG/ACT nasal spray SPRAY 2 SPRAYS INTO EACH NOSTRIL EVERY DAY 16 g 3  . metFORMIN (GLUCOPHAGE) 500 MG tablet Take 1 tablet (500 mg total) by mouth daily with breakfast. 30 tablet 0  . naproxen (NAPROSYN) 500 MG tablet Take 1 tablet (500 mg total) by mouth 2 (two) times daily with a meal. Use prn 60 tablet 3  . Olmesartan-amLODIPine-HCTZ 40-10-25 MG TABS Take 1 tablet by mouth daily. 90 tablet 3  . oxymetazoline (AFRIN NASAL SPRAY) 0.05 % nasal spray Place 1 spray into both nostrils 2 (two) times daily. Use only for 3days, then stop 30 mL 0  . Prenatal Vit-Fe Fumarate-FA (PNV PRENATAL PLUS  MULTIVITAMIN) 27-1 MG TABS Take 1 tablet by mouth daily. 90 tablet 3   No current facility-administered medications on file prior to visit.     PAST MEDICAL HISTORY: Past Medical History:  Diagnosis Date  . Allergy    rhinitis  . Anemia    iron deficiency  . Anemia, unspecified   . Asthma    asthma  . Colitis   . Fibromyalgia   . Hypertension   . Iron deficiency anemia, unspecified   . Leiomyoma of uterus, unspecified   . Leiomyoma of uterus, unspecified   . Meralgia paresthetica   . Nausea alone   . Obesity   . Other B-complex deficiencies   . Umbilical hernia   . Unspecified vitamin D deficiency     PAST SURGICAL HISTORY: Past Surgical History:  Procedure Laterality Date  . fibriodidectomy  2009  . MYOMECTOMY  2004   x 2    SOCIAL HISTORY: Social History   Tobacco Use  . Smoking status: Never Smoker  . Smokeless tobacco: Never Used  Substance Use Topics  . Alcohol use: No  . Drug use: No    FAMILY HISTORY: Family History  Problem Relation Age of Onset  . Hypertension Other   . High blood pressure Father   . Alcoholism Father     ROS: Review of Systems  Constitutional: Positive for  malaise/fatigue. Negative for weight loss.       Positive hot flashes  Cardiovascular: Positive for palpitations.  Gastrointestinal:       Negative dysphagia  Musculoskeletal:       Negative lump on neck    PHYSICAL EXAM: Blood pressure 102/69, pulse 71, temperature 98.5 F (36.9 C), temperature source Oral, height 5\' 3"  (1.6 m), weight 216 lb (98 kg), SpO2 98 %. Body mass index is 38.26 kg/m. Physical Exam  Constitutional: She is oriented to person, place, and time. She appears well-developed and well-nourished.  Cardiovascular: Normal rate.  Pulmonary/Chest: Effort normal.  Musculoskeletal: Normal range of motion.  Neurological: She is oriented to person, place, and time.  Skin: Skin is warm and dry.  Psychiatric: She has a normal mood and affect. Her  behavior is normal.  Vitals reviewed.   RECENT LABS AND TESTS: BMET    Component Value Date/Time   NA 140 10/07/2017 1011   K 4.1 10/07/2017 1011   CL 100 10/07/2017 1011   CO2 27 10/07/2017 1011   GLUCOSE 88 10/07/2017 1011   GLUCOSE 89 09/30/2017 0938   BUN 8 10/07/2017 1011   CREATININE 0.57 10/07/2017 1011   CALCIUM 9.7 10/07/2017 1011   GFRNONAA 111 10/07/2017 1011   GFRAA 128 10/07/2017 1011   Lab Results  Component Value Date   HGBA1C 5.8 (H) 10/07/2017   HGBA1C 6.1 10/23/2016   HGBA1C 5.8 04/21/2016   HGBA1C 5.7 10/10/2014   HGBA1C 5.8 09/20/2012   Lab Results  Component Value Date   INSULIN 7.8 10/07/2017   CBC    Component Value Date/Time   WBC 3.1 (L) 09/30/2017 0938   RBC 5.02 09/30/2017 0938   HGB 13.2 09/30/2017 0938   HCT 40.3 09/30/2017 0938   PLT 136.0 (L) 09/30/2017 0938   MCV 80.2 09/30/2017 0938   MCH 26.3 08/24/2017 0922   MCHC 32.7 09/30/2017 0938   RDW 15.4 09/30/2017 0938   LYMPHSABS 1.1 09/30/2017 0938   MONOABS 0.3 09/30/2017 0938   EOSABS 0.1 09/30/2017 0938   BASOSABS 0.0 09/30/2017 0938   Iron/TIBC/Ferritin/ %Sat    Component Value Date/Time   IRON 104 09/30/2017 0938   TIBC 344 09/30/2017 0938   FERRITIN 33 09/30/2017 0938   IRONPCTSAT 30 09/30/2017 0938   Lipid Panel     Component Value Date/Time   CHOL 180 09/30/2017 0938   TRIG 67.0 09/30/2017 0938   HDL 56.30 09/30/2017 0938   CHOLHDL 3 09/30/2017 0938   VLDL 13.4 09/30/2017 0938   LDLCALC 111 (H) 09/30/2017 0938   Hepatic Function Panel     Component Value Date/Time   PROT 7.2 10/07/2017 1011   ALBUMIN 4.4 10/07/2017 1011   AST 25 10/07/2017 1011   ALT 19 10/07/2017 1011   ALKPHOS 58 10/07/2017 1011   BILITOT 0.2 10/07/2017 1011   BILIDIR 0.0 09/30/2017 0938   IBILI 0.1 (L) 10/21/2012 2000      Component Value Date/Time   TSH 0.444 (L) 10/07/2017 1011   TSH 0.22 (L) 09/30/2017 0938   TSH 0.59 06/30/2017 0854    ASSESSMENT AND PLAN: Low TSH  level  Class 2 severe obesity with serious comorbidity and body mass index (BMI) of 38.0 to 38.9 in adult, unspecified obesity type ([place])  PLAN:  Low TSH We will wait 6 weeks from previous testing to repeat labs and [name] agrees to follow up with our clinic in 2 weeks.  We spent > than 50% of the 15 minute  visit on the counseling as documented in the note.  Obesity [name] is currently in the action stage of change. As such, her goal is to continue with weight loss efforts She has agreed to keep a food journal with 1200 calories and 85 grams of protein daily [name] has been instructed to work up to a goal of 150 minutes of combined cardio and strengthening exercise per week for weight loss and overall health benefits. We discussed the following Behavioral Modification Strategies today: increasing lean protein intake and better snacking choices   [name] has agreed to follow up with our clinic in 2 weeks. She was informed of the importance of frequent follow up visits to maximize her success with intensive lifestyle modifications for her multiple health conditions.   OBESITY BEHAVIORAL INTERVENTION VISIT  Today's visit was # 3 out of 22.  Starting weight: 215 lbs Starting date: 10/07/17 Today's weight : 216 lbs Today's date: 11/03/2017 Total lbs lost to date: 0 (Patients must lose 7 lbs in the first 6 months to continue with counseling)   ASK: We discussed the diagnosis of obesity with [name] today and [name] agreed to give [place] permission to discuss obesity behavioral modification therapy today.  ASSESS: [name] has the diagnosis of obesity and her BMI today is 38.27 [name] is in the action stage of change   ADVISE: [name] was educated on the multiple health risks of obesity as well as the benefit of weight loss to improve her health. She was advised of the need for long term treatment and the importance of lifestyle modifications.  AGREE: Multiple dietary  modification options and treatment options were discussed and  [name] agreed to the above obesity treatment plan.   [name], am acting as transcriptionist for Lacy Duverney, PA-C I, Lacy Duverney [facility], have reviewed this note and agree with its content

## 2017-11-17 ENCOUNTER — Ambulatory Visit (INDEPENDENT_AMBULATORY_CARE_PROVIDER_SITE_OTHER): Payer: 59 | Admitting: Physician Assistant

## 2017-11-18 ENCOUNTER — Other Ambulatory Visit (INDEPENDENT_AMBULATORY_CARE_PROVIDER_SITE_OTHER): Payer: Self-pay | Admitting: Family Medicine

## 2017-11-18 DIAGNOSIS — R7303 Prediabetes: Secondary | ICD-10-CM

## 2017-11-30 ENCOUNTER — Ambulatory Visit (INDEPENDENT_AMBULATORY_CARE_PROVIDER_SITE_OTHER): Payer: 59 | Admitting: Physician Assistant

## 2017-11-30 VITALS — BP 107/72 | HR 75 | Temp 98.3°F | Ht 63.0 in | Wt 218.0 lb

## 2017-11-30 DIAGNOSIS — Z6838 Body mass index (BMI) 38.0-38.9, adult: Secondary | ICD-10-CM | POA: Diagnosis not present

## 2017-11-30 DIAGNOSIS — I10 Essential (primary) hypertension: Secondary | ICD-10-CM

## 2017-11-30 DIAGNOSIS — Z9189 Other specified personal risk factors, not elsewhere classified: Secondary | ICD-10-CM

## 2017-11-30 DIAGNOSIS — R7303 Prediabetes: Secondary | ICD-10-CM | POA: Diagnosis not present

## 2017-11-30 MED ORDER — METFORMIN HCL 500 MG PO TABS: 500.0000 mg | ORAL_TABLET | Freq: Every day | ORAL | 0 refills | Status: DC

## 2017-12-01 NOTE — Progress Notes (Signed)
Office: [phone]  /  Fax: [phone]   HPI:   Chief Complaint: OBESITY [name] is here to discuss her progress with her obesity treatment plan. She is on the keep a food journal with 1200 calories and 85 grams of protein daily and is following her eating plan approximately 10 % of the time. She states she is exercising 0 minutes 0 times per week. [name] was on a cruise and deviated with her eating. She requests a different meal plan, one lower in carbohydrates. She requests a longer follow up as she will be on vacation in 2 weeks.  Her weight is 218 lb (98.9 kg) today and has gained 2 pounds since her last visit. She has lost 0 lbs since starting treatment with [place].  Pre-Diabetes [name] has a diagnosis of pre-diabetes based on her elevated Hgb A1c and was informed this puts her at greater risk of developing diabetes. She is taking metformin currently and continues to work on diet and exercise to decrease risk of diabetes. She denies polyphagia, nausea or hypoglycemia.  At risk for diabetes [name] is at higher than average risk for developing diabetes due to her obesity and pre-diabetes. She currently denies polyuria or polydipsia.  Hypertension [name] is a 47 y.o. female with hypertension. [name]'s blood pressure is stable and she denies chest pain or shortness of breath. She is working weight loss to help control her blood pressure with the goal of decreasing her risk of heart attack and stroke. [name]'s blood pressure is currently controlled.  ALLERGIES: No Known Allergies  MEDICATIONS: Current Outpatient Medications on File Prior to Visit  Medication Sig Dispense Refill  . ALPRAZolam (XANAX) 0.5 MG tablet Take 1-2 tablets (0.5-1 mg total) by mouth at bedtime. 3 months supply 180 tablet 1  . cetirizine (ZYRTEC) 10 MG tablet Take 10 mg by mouth daily.    . Cholecalciferol (EQL VITAMIN D3) 1000 UNITS tablet Take 1,000 Units by mouth daily.      . cyclobenzaprine  (FLEXERIL) 10 MG tablet Take 1-2 tablets (10-20 mg total) by mouth at bedtime. Take 1-2 qhs 180 tablet 3  . fluticasone (FLONASE) 50 MCG/ACT nasal spray SPRAY 2 SPRAYS INTO EACH NOSTRIL EVERY DAY 16 g 3  . naproxen (NAPROSYN) 500 MG tablet Take 1 tablet (500 mg total) by mouth 2 (two) times daily with a meal. Use prn 60 tablet 3  . Olmesartan-amLODIPine-HCTZ 40-10-25 MG TABS Take 1 tablet by mouth daily. 90 tablet 3  . oxymetazoline (AFRIN NASAL SPRAY) 0.05 % nasal spray Place 1 spray into both nostrils 2 (two) times daily. Use only for 3days, then stop 30 mL 0  . Prenatal Vit-Fe Fumarate-FA (PNV PRENATAL PLUS MULTIVITAMIN) 27-1 MG TABS Take 1 tablet by mouth daily. 90 tablet 3   No current facility-administered medications on file prior to visit.     PAST MEDICAL HISTORY: Past Medical History:  Diagnosis Date  . Allergy    rhinitis  . Anemia    iron deficiency  . Anemia, unspecified   . Asthma    asthma  . Colitis   . Fibromyalgia   . Hypertension   . Iron deficiency anemia, unspecified   . Leiomyoma of uterus, unspecified   . Leiomyoma of uterus, unspecified   . Meralgia paresthetica   . Nausea alone   . Obesity   . Other B-complex deficiencies   . Umbilical hernia   . Unspecified vitamin D deficiency     PAST SURGICAL HISTORY: Past Surgical History:  Procedure Laterality Date  . fibriodidectomy  2009  . MYOMECTOMY  2004   x 2    SOCIAL HISTORY: Social History   Tobacco Use  . Smoking status: Never Smoker  . Smokeless tobacco: Never Used  Substance Use Topics  . Alcohol use: No  . Drug use: No    FAMILY HISTORY: Family History  Problem Relation Age of Onset  . Hypertension Other   . High blood pressure Father   . Alcoholism Father     ROS: Review of Systems  Constitutional: Negative for weight loss.  Respiratory: Negative for shortness of breath.   Cardiovascular: Negative for chest pain.  Gastrointestinal: Negative for nausea.  Genitourinary:  Negative for frequency.  Endo/Heme/Allergies: Negative for polydipsia.       Negative polyphagia Negative hypoglycemia    PHYSICAL EXAM: Blood pressure 107/72, pulse 75, temperature 98.3 F (36.8 C), temperature source Oral, height 5\' 3"  (1.6 m), weight 218 lb (98.9 kg), SpO2 98 %. Body mass index is 38.62 kg/m. Physical Exam  Constitutional: She is oriented to person, place, and time. She appears well-developed and well-nourished.  Cardiovascular: Normal rate.  Pulmonary/Chest: Effort normal.  Musculoskeletal: Normal range of motion.  Neurological: She is oriented to person, place, and time.  Skin: Skin is warm and dry.  Psychiatric: She has a normal mood and affect. Her behavior is normal.  Vitals reviewed.   RECENT LABS AND TESTS: BMET    Component Value Date/Time   NA 140 10/07/2017 1011   K 4.1 10/07/2017 1011   CL 100 10/07/2017 1011   CO2 27 10/07/2017 1011   GLUCOSE 88 10/07/2017 1011   GLUCOSE 89 09/30/2017 0938   BUN 8 10/07/2017 1011   CREATININE 0.57 10/07/2017 1011   CALCIUM 9.7 10/07/2017 1011   GFRNONAA 111 10/07/2017 1011   GFRAA 128 10/07/2017 1011   Lab Results  Component Value Date   HGBA1C 5.8 (H) 10/07/2017   HGBA1C 6.1 10/23/2016   HGBA1C 5.8 04/21/2016   HGBA1C 5.7 10/10/2014   HGBA1C 5.8 09/20/2012   Lab Results  Component Value Date   INSULIN 7.8 10/07/2017   CBC    Component Value Date/Time   WBC 3.1 (L) 09/30/2017 0938   RBC 5.02 09/30/2017 0938   HGB 13.2 09/30/2017 0938   HCT 40.3 09/30/2017 0938   PLT 136.0 (L) 09/30/2017 0938   MCV 80.2 09/30/2017 0938   MCH 26.3 08/24/2017 0922   MCHC 32.7 09/30/2017 0938   RDW 15.4 09/30/2017 0938   LYMPHSABS 1.1 09/30/2017 0938   MONOABS 0.3 09/30/2017 0938   EOSABS 0.1 09/30/2017 0938   BASOSABS 0.0 09/30/2017 0938   Iron/TIBC/Ferritin/ %Sat    Component Value Date/Time   IRON 104 09/30/2017 0938   TIBC 344 09/30/2017 0938   FERRITIN 33 09/30/2017 0938   IRONPCTSAT 30  09/30/2017 0938   Lipid Panel     Component Value Date/Time   CHOL 180 09/30/2017 0938   TRIG 67.0 09/30/2017 0938   HDL 56.30 09/30/2017 0938   CHOLHDL 3 09/30/2017 0938   VLDL 13.4 09/30/2017 0938   LDLCALC 111 (H) 09/30/2017 0938   Hepatic Function Panel     Component Value Date/Time   PROT 7.2 10/07/2017 1011   ALBUMIN 4.4 10/07/2017 1011   AST 25 10/07/2017 1011   ALT 19 10/07/2017 1011   ALKPHOS 58 10/07/2017 1011   BILITOT 0.2 10/07/2017 1011   BILIDIR 0.0 09/30/2017 0938   IBILI 0.1 (L) 10/21/2012 2000  Component Value Date/Time   TSH 0.444 (L) 10/07/2017 1011   TSH 0.22 (L) 09/30/2017 0938   TSH 0.59 06/30/2017 0854    ASSESSMENT AND PLAN: Prediabetes - Plan: metFORMIN (GLUCOPHAGE) 500 MG tablet  Essential hypertension  At risk for diabetes mellitus  Class 2 severe obesity with serious comorbidity and body mass index (BMI) of 38.0 to 38.9 in adult, unspecified obesity type [facility])  PLAN:  Pre-Diabetes Reona will continue to work on weight loss, exercise, and decreasing simple carbohydrates in her diet to help decrease the risk of diabetes. We dicussed metformin including benefits and risks. She was informed that eating too many simple carbohydrates or too many calories at one sitting increases the likelihood of GI side effects. Tawonna agrees to continue taking metformin 500 mg q AM #30 and we will refill for 1 month. Zayonna agrees to follow up with our clinic in 3 weeks as directed to monitor her progress.  Diabetes risk counselling Kenzlie was given extended (15 minutes) diabetes prevention counseling today. She is 47 y.o. female and has risk factors for diabetes including obesity and pre-diabetes. We discussed intensive lifestyle modifications today with an emphasis on weight loss as well as increasing exercise and decreasing simple carbohydrates in her diet.  Hypertension We discussed sodium restriction, working on healthy weight loss, and a regular  exercise program as the means to achieve improved blood pressure control. [employer] agreed with this plan and agreed to follow up as directed. We will continue to monitor her blood pressure as well as her progress with the above lifestyle modifications. She will continue her medications as prescribed and will watch for signs of hypotension as she continues her lifestyle modifications. Cheree agrees to follow up with our clinic in 3 weeks.  Obesity Kecia is currently in the action stage of change. As such, her goal is to continue with weight loss efforts She has agreed to follow a lower carbohydrate, vegetable and lean protein rich diet plan Liesa has been instructed to work up to a goal of 150 minutes of combined cardio and strengthening exercise per week for weight loss and overall health benefits. We discussed the following Behavioral Modification Strategies today: decreasing simple carbohydrates and celebration eating strategies   Else has agreed to follow up with our clinic in 3 weeks. She was informed of the importance of frequent follow up visits to maximize her success with intensive lifestyle modifications for her multiple health conditions.   OBESITY BEHAVIORAL INTERVENTION VISIT  Today's visit was # 4 out of 22.  Starting weight: 215 lbs Starting date: 10/07/17 Today's weight : 218 lbs  Today's date: 11/30/2017 Total lbs lost to date: 0 (Patients must lose 7 lbs in the first 6 months to continue with counseling)   ASK: We discussed the diagnosis of obesity with [name] today and Georgiana agreed to give [place] permission to discuss obesity behavioral modification therapy today.  ASSESS: Sylver has the diagnosis of obesity and her BMI today is 38.63 Braeleigh is in the action stage of change   ADVISE: Shakera was educated on the multiple health risks of obesity as well as the benefit of weight loss to improve her health. She was advised of the need for long term  treatment and the importance of lifestyle modifications.  AGREE: Multiple dietary modification options and treatment options were discussed and  Neliah agreed to the above obesity treatment plan.   Wilhemena Durie, am acting as transcriptionist for [employer], PA-C I, Lacy Duverney PAC,  have reviewed this note and agree with its content

## 2017-12-21 ENCOUNTER — Ambulatory Visit (INDEPENDENT_AMBULATORY_CARE_PROVIDER_SITE_OTHER): Payer: 59 | Admitting: Physician Assistant

## 2017-12-21 VITALS — BP 109/73 | HR 75 | Temp 98.0°F | Ht 63.0 in | Wt 217.0 lb

## 2017-12-21 DIAGNOSIS — Z6838 Body mass index (BMI) 38.0-38.9, adult: Secondary | ICD-10-CM | POA: Diagnosis not present

## 2017-12-21 DIAGNOSIS — Z9189 Other specified personal risk factors, not elsewhere classified: Secondary | ICD-10-CM | POA: Diagnosis not present

## 2017-12-21 DIAGNOSIS — R7303 Prediabetes: Secondary | ICD-10-CM | POA: Diagnosis not present

## 2017-12-21 DIAGNOSIS — I10 Essential (primary) hypertension: Secondary | ICD-10-CM | POA: Diagnosis not present

## 2017-12-21 DIAGNOSIS — E66812 Morbid (severe) obesity due to excess calories: Secondary | ICD-10-CM

## 2017-12-21 MED ORDER — METFORMIN HCL 500 MG PO TABS: 500.0000 mg | ORAL_TABLET | Freq: Every day | ORAL | 0 refills | Status: DC

## 2017-12-21 NOTE — Progress Notes (Signed)
Office: [phone]  /  Fax: [phone]   HPI:   Chief Complaint: OBESITY [name] is here to discuss her progress with her obesity treatment plan. She is on the lower carbohydrate, vegetable and lean protein rich diet plan and is following her eating plan approximately 40 % of the time. She states she is walking more on vacation. [name] continues to do well. She managed to make mindful food choices and walked more over vacation.  Her weight is 217 lb (98.4 kg) today and has had a weight loss of 1 pound over a period of 3 weeks since her last visit. She has lost 0 lbs since starting treatment with [place].  Pre-Diabetes [name] has a diagnosis of pre-diabetes based on her elevated Hgb A1c and was informed this puts her at greater risk of developing diabetes. She is taking metformin currently and continues to work on diet and exercise to decrease risk of diabetes. She denies polyphagia, nausea or hypoglycemia.  At risk for diabetes [name] is at higher than average risk for developing diabetes due to her obesity and pre-diabetes. She currently denies polyuria or polydipsia.  Hypertension [name] is a 47 y.o. female with hypertension. [name]'s blood pressure is stable. She denies chest pain or shortness of breath. She is working weight loss to help control her blood pressure with the goal of decreasing her risk of heart attack and stroke. [name]'s blood pressure is currently controlled.  ALLERGIES: No Known Allergies  MEDICATIONS: Current Outpatient Medications on File Prior to Visit  Medication Sig Dispense Refill  . ALPRAZolam (XANAX) 0.5 MG tablet Take 1-2 tablets (0.5-1 mg total) by mouth at bedtime. 3 months supply 180 tablet 1  . cetirizine (ZYRTEC) 10 MG tablet Take 10 mg by mouth daily.    . Cholecalciferol (EQL VITAMIN D3) 1000 UNITS tablet Take 1,000 Units by mouth daily.      . cyclobenzaprine (FLEXERIL) 10 MG tablet Take 1-2 tablets (10-20 mg total) by mouth at  bedtime. Take 1-2 qhs 180 tablet 3  . fluticasone (FLONASE) 50 MCG/ACT nasal spray SPRAY 2 SPRAYS INTO EACH NOSTRIL EVERY DAY 16 g 3  . naproxen (NAPROSYN) 500 MG tablet Take 1 tablet (500 mg total) by mouth 2 (two) times daily with a meal. Use prn 60 tablet 3  . Olmesartan-amLODIPine-HCTZ 40-10-25 MG TABS Take 1 tablet by mouth daily. 90 tablet 3  . oxymetazoline (AFRIN NASAL SPRAY) 0.05 % nasal spray Place 1 spray into both nostrils 2 (two) times daily. Use only for 3days, then stop 30 mL 0  . Prenatal Vit-Fe Fumarate-FA (PNV PRENATAL PLUS MULTIVITAMIN) 27-1 MG TABS Take 1 tablet by mouth daily. 90 tablet 3   No current facility-administered medications on file prior to visit.     PAST MEDICAL HISTORY: Past Medical History:  Diagnosis Date  . Allergy    rhinitis  . Anemia    iron deficiency  . Anemia, unspecified   . Asthma    asthma  . Colitis   . Fibromyalgia   . Hypertension   . Iron deficiency anemia, unspecified   . Leiomyoma of uterus, unspecified   . Leiomyoma of uterus, unspecified   . Meralgia paresthetica   . Nausea alone   . Obesity   . Other B-complex deficiencies   . Umbilical hernia   . Unspecified vitamin D deficiency     PAST SURGICAL HISTORY: Past Surgical History:  Procedure Laterality Date  . fibriodidectomy  2009  . MYOMECTOMY  2004   x  2    SOCIAL HISTORY: Social History   Tobacco Use  . Smoking status: Never Smoker  . Smokeless tobacco: Never Used  Substance Use Topics  . Alcohol use: No  . Drug use: No    FAMILY HISTORY: Family History  Problem Relation Age of Onset  . Hypertension Other   . High blood pressure Father   . Alcoholism Father     ROS: Review of Systems  Constitutional: Positive for weight loss.  Respiratory: Negative for shortness of breath.   Cardiovascular: Negative for chest pain.  Genitourinary: Negative for frequency.  Endo/Heme/Allergies: Negative for polydipsia.       Negative polyphagia Negative  hypoglycemia    PHYSICAL EXAM: Blood pressure 109/73, pulse 75, temperature 98 F (36.7 C), temperature source Oral, height 5\' 3"  (1.6 m), weight 217 lb (98.4 kg), last menstrual period 12/10/2017, SpO2 99 %. Body mass index is 38.44 kg/m. Physical Exam  Constitutional: She is oriented to person, place, and time. She appears well-developed and well-nourished.  Cardiovascular: Normal rate.  Pulmonary/Chest: Effort normal.  Musculoskeletal: Normal range of motion.  Neurological: She is oriented to person, place, and time.  Skin: Skin is warm and dry.  Psychiatric: She has a normal mood and affect. Her behavior is normal.  Vitals reviewed.   RECENT LABS AND TESTS: BMET    Component Value Date/Time   NA 140 10/07/2017 1011   K 4.1 10/07/2017 1011   CL 100 10/07/2017 1011   CO2 27 10/07/2017 1011   GLUCOSE 88 10/07/2017 1011   GLUCOSE 89 09/30/2017 0938   BUN 8 10/07/2017 1011   CREATININE 0.57 10/07/2017 1011   CALCIUM 9.7 10/07/2017 1011   GFRNONAA 111 10/07/2017 1011   GFRAA 128 10/07/2017 1011   Lab Results  Component Value Date   HGBA1C 5.8 (H) 10/07/2017   HGBA1C 6.1 10/23/2016   HGBA1C 5.8 04/21/2016   HGBA1C 5.7 10/10/2014   HGBA1C 5.8 09/20/2012   Lab Results  Component Value Date   INSULIN 7.8 10/07/2017   CBC    Component Value Date/Time   WBC 3.1 (L) 09/30/2017 0938   RBC 5.02 09/30/2017 0938   HGB 13.2 09/30/2017 0938   HCT 40.3 09/30/2017 0938   PLT 136.0 (L) 09/30/2017 0938   MCV 80.2 09/30/2017 0938   MCH 26.3 08/24/2017 0922   MCHC 32.7 09/30/2017 0938   RDW 15.4 09/30/2017 0938   LYMPHSABS 1.1 09/30/2017 0938   MONOABS 0.3 09/30/2017 0938   EOSABS 0.1 09/30/2017 0938   BASOSABS 0.0 09/30/2017 0938   Iron/TIBC/Ferritin/ %Sat    Component Value Date/Time   IRON 104 09/30/2017 0938   TIBC 344 09/30/2017 0938   FERRITIN 33 09/30/2017 0938   IRONPCTSAT 30 09/30/2017 0938   Lipid Panel     Component Value Date/Time   CHOL 180  09/30/2017 0938   TRIG 67.0 09/30/2017 0938   HDL 56.30 09/30/2017 0938   CHOLHDL 3 09/30/2017 0938   VLDL 13.4 09/30/2017 0938   LDLCALC 111 (H) 09/30/2017 0938   Hepatic Function Panel     Component Value Date/Time   PROT 7.2 10/07/2017 1011   ALBUMIN 4.4 10/07/2017 1011   AST 25 10/07/2017 1011   ALT 19 10/07/2017 1011   ALKPHOS 58 10/07/2017 1011   BILITOT 0.2 10/07/2017 1011   BILIDIR 0.0 09/30/2017 0938   IBILI 0.1 (L) 10/21/2012 2000      Component Value Date/Time   TSH 0.444 (L) 10/07/2017 1011   TSH 0.22 (L)  09/30/2017 0938   TSH 0.59 06/30/2017 0854    ASSESSMENT AND PLAN: Prediabetes - Plan: metFORMIN (GLUCOPHAGE) 500 MG tablet  Essential hypertension  At risk for diabetes mellitus  Class 2 severe obesity with serious comorbidity and body mass index (BMI) of 38.0 to 38.9 in adult, unspecified obesity type [facility])  PLAN:  Pre-Diabetes [name] will continue to work on weight loss, exercise, and decreasing simple carbohydrates in her diet to help decrease the risk of diabetes. We dicussed metformin including benefits and risks. She was informed that eating too many simple carbohydrates or too many calories at one sitting increases the likelihood of GI side effects. [name] agrees to continue taking metformin 500 mg q AM #30 and we will refill for 1 month. [name] agrees to follow up with our clinic in 3 weeks as directed to monitor her progress.  Diabetes risk counselling [name] was given extended (15 minutes) diabetes prevention counseling today. She is 48 y.o. female and has risk factors for diabetes including obesity and pre-diabetes. We discussed intensive lifestyle modifications today with an emphasis on weight loss as well as increasing exercise and decreasing simple carbohydrates in her diet.  Hypertension We discussed sodium restriction, working on healthy weight loss, and a regular exercise program as the means to achieve improved blood pressure control.  [name] agreed with this plan and agreed to follow up as directed. We will continue to monitor her blood pressure as well as her progress with the above lifestyle modifications. She will continue her medications as prescribed and will watch for signs of hypotension as she continues her lifestyle modifications. [name] agrees to follow up with our clinic in 3 weeks.  Obesity [name] is currently in the action stage of change. As such, her goal is to continue with weight loss efforts She has agreed to follow a lower carbohydrate, vegetable and lean protein rich diet plan [name] has been instructed to work up to a goal of 150 minutes of combined cardio and strengthening exercise per week for weight loss and overall health benefits. We discussed the following Behavioral Modification Strategies today: increasing lean protein intake and work on meal planning and easy cooking plans   [name] has agreed to follow up with our clinic in 3 weeks. She was informed of the importance of frequent follow up visits to maximize her success with intensive lifestyle modifications for her multiple health conditions.   OBESITY BEHAVIORAL INTERVENTION VISIT  Today's visit was # 5 out of 22.  Starting weight: 215 lbs Starting date: 10/07/17 Today's weight : 217 lbs  Today's date: 12/21/2017 Total lbs lost to date: 0 (Patients must lose 7 lbs in the first 6 months to continue with counseling)   ASK: We discussed the diagnosis of obesity with [name] today and [name] agreed to give [place] permission to discuss obesity behavioral modification therapy today.  ASSESS: [name] has the diagnosis of obesity and her BMI today is 38.45 [name] is in the action stage of change   ADVISE: [name] was educated on the multiple health risks of obesity as well as the benefit of weight loss to improve her health. She was advised of the need for long term treatment and the importance of lifestyle  modifications.  AGREE: Multiple dietary modification options and treatment options were discussed and  [name] agreed to the above obesity treatment plan.   [name], am acting as transcriptionist for Lacy Duverney, PA-C I, Lacy Duverney [facility], have reviewed this note and agree with its content

## 2018-01-11 ENCOUNTER — Ambulatory Visit (INDEPENDENT_AMBULATORY_CARE_PROVIDER_SITE_OTHER): Payer: 59 | Admitting: Physician Assistant

## 2018-01-11 ENCOUNTER — Encounter (INDEPENDENT_AMBULATORY_CARE_PROVIDER_SITE_OTHER): Payer: Self-pay

## 2018-02-05 ENCOUNTER — Encounter: Payer: Self-pay | Admitting: Internal Medicine

## 2018-02-05 ENCOUNTER — Ambulatory Visit: Payer: 59 | Admitting: Internal Medicine

## 2018-02-05 ENCOUNTER — Other Ambulatory Visit (INDEPENDENT_AMBULATORY_CARE_PROVIDER_SITE_OTHER): Payer: 59

## 2018-02-05 VITALS — BP 120/82 | HR 68 | Ht 63.0 in | Wt 223.0 lb

## 2018-02-05 DIAGNOSIS — R7989 Other specified abnormal findings of blood chemistry: Secondary | ICD-10-CM | POA: Diagnosis not present

## 2018-02-05 DIAGNOSIS — G4726 Circadian rhythm sleep disorder, shift work type: Secondary | ICD-10-CM

## 2018-02-05 DIAGNOSIS — E538 Deficiency of other specified B group vitamins: Secondary | ICD-10-CM

## 2018-02-05 DIAGNOSIS — R7309 Other abnormal glucose: Secondary | ICD-10-CM | POA: Diagnosis not present

## 2018-02-05 DIAGNOSIS — I10 Essential (primary) hypertension: Secondary | ICD-10-CM | POA: Diagnosis not present

## 2018-02-05 DIAGNOSIS — R7303 Prediabetes: Secondary | ICD-10-CM

## 2018-02-05 LAB — BASIC METABOLIC PANEL
BUN: 10 mg/dL (ref 6–23)
CHLORIDE: 104 meq/L (ref 96–112)
CO2: 28 meq/L (ref 19–32)
Calcium: 9.5 mg/dL (ref 8.4–10.5)
Creatinine, Ser: 0.57 mg/dL (ref 0.40–1.20)
GFR: 145.59 mL/min (ref >60.00)
Glucose, Bld: 93 mg/dL (ref 70–99)
POTASSIUM: 3.8 meq/L (ref 3.5–5.1)
SODIUM: 139 meq/L (ref 135–145)

## 2018-02-05 LAB — CBC WITH DIFFERENTIAL/PLATELET
BASOS ABS: 0 10*3/uL (ref 0.0–0.1)
Basophils Relative: 0.6 % (ref 0.0–3.0)
EOS ABS: 0.1 10*3/uL (ref 0.0–0.7)
Eosinophils Relative: 1.8 % (ref 0.0–5.0)
HCT: 37.1 % (ref 36.0–46.0)
Hemoglobin: 12.3 g/dL (ref 12.0–15.0)
Lymphocytes Relative: 30.1 % (ref 12.0–46.0)
Lymphs Abs: 1.4 10*3/uL (ref 0.7–4.0)
MCHC: 33 g/dL (ref 30.0–36.0)
MCV: 80.6 fl (ref 78.0–100.0)
MONO ABS: 0.5 10*3/uL (ref 0.1–1.0)
Monocytes Relative: 9.8 % (ref 3.0–12.0)
NEUTROS PCT: 57.7 % (ref 43.0–77.0)
Neutro Abs: 2.8 10*3/uL (ref 1.4–7.7)
PLATELETS: 135 10*3/uL — AB (ref 150.0–400.0)
RBC: 4.6 Mil/uL (ref 3.87–5.11)
RDW: 15.7 % — ABNORMAL HIGH (ref 11.5–15.5)
WBC: 4.8 10*3/uL (ref 4.0–10.5)

## 2018-02-05 LAB — URINALYSIS, ROUTINE W REFLEX MICROSCOPIC
BILIRUBIN URINE: NEGATIVE
Ketones, ur: NEGATIVE
LEUKOCYTES UA: NEGATIVE
Nitrite: NEGATIVE
Specific Gravity, Urine: 1.02 (ref 1.000–1.030)
Total Protein, Urine: NEGATIVE
URINE GLUCOSE: NEGATIVE
Urobilinogen, UA: 0.2 (ref 0.0–1.0)
pH: 6 (ref 5.0–8.0)

## 2018-02-05 LAB — HEPATIC FUNCTION PANEL
ALK PHOS: 49 U/L (ref 39–117)
ALT: 14 U/L (ref 0–35)
AST: 18 U/L (ref 0–37)
Albumin: 4 g/dL (ref 3.5–5.2)
BILIRUBIN DIRECT: 0 mg/dL (ref 0.0–0.3)
BILIRUBIN TOTAL: 0.3 mg/dL (ref 0.2–1.2)
TOTAL PROTEIN: 7.2 g/dL (ref 6.0–8.3)

## 2018-02-05 LAB — HEMOGLOBIN A1C: HEMOGLOBIN A1C: 5.8 % (ref 4.6–6.5)

## 2018-02-05 LAB — TSH: TSH: 0.61 u[IU]/mL (ref 0.35–4.50)

## 2018-02-05 MED ORDER — ALPRAZOLAM 0.5 MG PO TABS: 0.5000 mg | ORAL_TABLET | Freq: Every day | ORAL | 1 refills | Status: DC

## 2018-02-05 MED ORDER — METFORMIN HCL 500 MG PO TABS: 500.0000 mg | ORAL_TABLET | Freq: Every day | ORAL | 3 refills | Status: DC

## 2018-02-05 MED ORDER — ARMODAFINIL 150 MG PO TABS
150.0000 mg | ORAL_TABLET | Freq: Every day | ORAL | 5 refills | Status: DC
Start: 2018-02-05 — End: 2019-11-03

## 2018-02-05 NOTE — Assessment & Plan Note (Signed)
Nuvigl discussed Work less

## 2018-02-05 NOTE — Assessment & Plan Note (Signed)
F/u w/[place]

## 2018-02-05 NOTE — Assessment & Plan Note (Signed)
B 12

## 2018-02-05 NOTE — Assessment & Plan Note (Signed)
[place] 

## 2018-02-05 NOTE — Assessment & Plan Note (Signed)
Labs

## 2018-02-05 NOTE — Progress Notes (Signed)
Subjective:  Patient ID: [name], female    DOB: 1971/08/01  Age: 47 y.o. MRN: [id number]  CC: No chief complaint on file.   HPI [name] presents for fatigue. Working weard shifts - unable to rest well. C/o wt gain. Working 70/wk  Outpatient Medications Prior to Visit  Medication Sig Dispense Refill  . ALPRAZolam (XANAX) 0.5 MG tablet Take 1-2 tablets (0.5-1 mg total) by mouth at bedtime. 3 months supply 180 tablet 1  . cetirizine (ZYRTEC) 10 MG tablet Take 10 mg by mouth daily.    . Cholecalciferol (EQL VITAMIN D3) 1000 UNITS tablet Take 1,000 Units by mouth daily.      . cyclobenzaprine (FLEXERIL) 10 MG tablet Take 1-2 tablets (10-20 mg total) by mouth at bedtime. Take 1-2 qhs 180 tablet 3  . fluticasone (FLONASE) 50 MCG/ACT nasal spray SPRAY 2 SPRAYS INTO EACH NOSTRIL EVERY DAY 16 g 3  . metFORMIN (GLUCOPHAGE) 500 MG tablet Take 1 tablet (500 mg total) by mouth daily with breakfast. 30 tablet 0  . naproxen (NAPROSYN) 500 MG tablet Take 1 tablet (500 mg total) by mouth 2 (two) times daily with a meal. Use prn 60 tablet 3  . Olmesartan-amLODIPine-HCTZ 40-10-25 MG TABS Take 1 tablet by mouth daily. 90 tablet 3  . oxymetazoline (AFRIN NASAL SPRAY) 0.05 % nasal spray Place 1 spray into both nostrils 2 (two) times daily. Use only for 3days, then stop 30 mL 0  . Prenatal Vit-Fe Fumarate-FA (PNV PRENATAL PLUS MULTIVITAMIN) 27-1 MG TABS Take 1 tablet by mouth daily. 90 tablet 3   No facility-administered medications prior to visit.     ROS: Review of Systems  Constitutional: Positive for fatigue. Negative for activity change, appetite change, chills and unexpected weight change.  HENT: Negative for congestion, mouth sores and sinus pressure.   Eyes: Negative for visual disturbance.  Respiratory: Negative for cough and chest tightness.   Gastrointestinal: Negative for abdominal pain and nausea.  Genitourinary: Positive for frequency. Negative for difficulty urinating and  vaginal pain.  Musculoskeletal: Negative for back pain and gait problem.  Skin: Negative for pallor and rash.  Neurological: Negative for dizziness, tremors, numbness and headaches.  Psychiatric/Behavioral: Negative for confusion and sleep disturbance.    Objective:  Ht 5\' 3"  (1.6 m)   Wt 223 lb (101.2 kg)   BMI 39.50 kg/m   BP Readings from Last 3 Encounters:  12/21/17 109/73  11/30/17 107/72  11/03/17 102/69    Wt Readings from Last 3 Encounters:  02/05/18 223 lb (101.2 kg)  12/21/17 217 lb (98.4 kg)  11/30/17 218 lb (98.9 kg)    Physical Exam  Constitutional: She appears well-developed. No distress.  HENT:  Head: Normocephalic.  Right Ear: External ear normal.  Left Ear: External ear normal.  Nose: Nose normal.  Mouth/Throat: Oropharynx is clear and moist.  Eyes: Pupils are equal, round, and reactive to light. Conjunctivae are normal. Right eye exhibits no discharge. Left eye exhibits no discharge.  Neck: Normal range of motion. Neck supple. No JVD present. No tracheal deviation present. No thyromegaly present.  Cardiovascular: Normal rate, regular rhythm and normal heart sounds.  Pulmonary/Chest: No stridor. No respiratory distress. She has no wheezes.  Abdominal: Soft. Bowel sounds are normal. She exhibits no distension and no mass. There is no tenderness. There is no rebound and no guarding.  Musculoskeletal: She exhibits no edema or tenderness.  Lymphadenopathy:    She has no cervical adenopathy.  Neurological: She displays normal reflexes. No  cranial nerve deficit. She exhibits normal muscle tone. Coordination normal.  Skin: No rash noted. No erythema.  Psychiatric: She has a normal mood and affect. Her behavior is normal. Judgment and thought content normal.    Lab Results  Component Value Date   WBC 3.1 (L) 09/30/2017   HGB 13.2 09/30/2017   HCT 40.3 09/30/2017   PLT 136.0 (L) 09/30/2017   GLUCOSE 88 10/07/2017   CHOL 180 09/30/2017   TRIG 67.0  09/30/2017   HDL 56.30 09/30/2017   LDLCALC 111 (H) 09/30/2017   ALT 19 10/07/2017   AST 25 10/07/2017   NA 140 10/07/2017   K 4.1 10/07/2017   CL 100 10/07/2017   CREATININE 0.57 10/07/2017   BUN 8 10/07/2017   CO2 27 10/07/2017   TSH 0.444 (L) 10/07/2017   INR 0.9 RATIO 03/15/2007   HGBA1C 5.8 (H) 10/07/2017    Dg Chest 2 View  Result Date: 08/24/2017 CLINICAL DATA:  Chest pain. EXAM: CHEST  2 VIEW COMPARISON:  None. FINDINGS: Normal cardiac silhouette and mediastinal contours. There is mild thickening of the pulmonary interstitium, most conspicuous about the bilateral pulmonary hila. No discrete focal airspace opacities. No pleural effusion or pneumothorax. No evidence of edema. No acute osseus abnormalities. IMPRESSION: Findings suggestive of airways disease / bronchitis. No focal airspace opacities to suggest pneumonia. Electronically Signed   By: Sandi Mariscal M.D.   On: 08/24/2017 09:23    Assessment & Plan:   There are no diagnoses linked to this encounter.   No orders of the defined types were placed in this encounter.    Follow-up: No follow-ups on file.  Walker Kehr, MD

## 2018-02-12 IMAGING — DX DG ABDOMEN 2V
3 series · 3 of 3 positions shown · non-contrast
Comparison: CT abdomen and pelvis 10/22/2012

CLINICAL DATA: Generalized abdominal pain mostly after eating,
nausea, diarrhea and constipation intermittently for 1 month

EXAM:
ABDOMEN - 2 VIEW

[abdomen erect (1 of 2)]
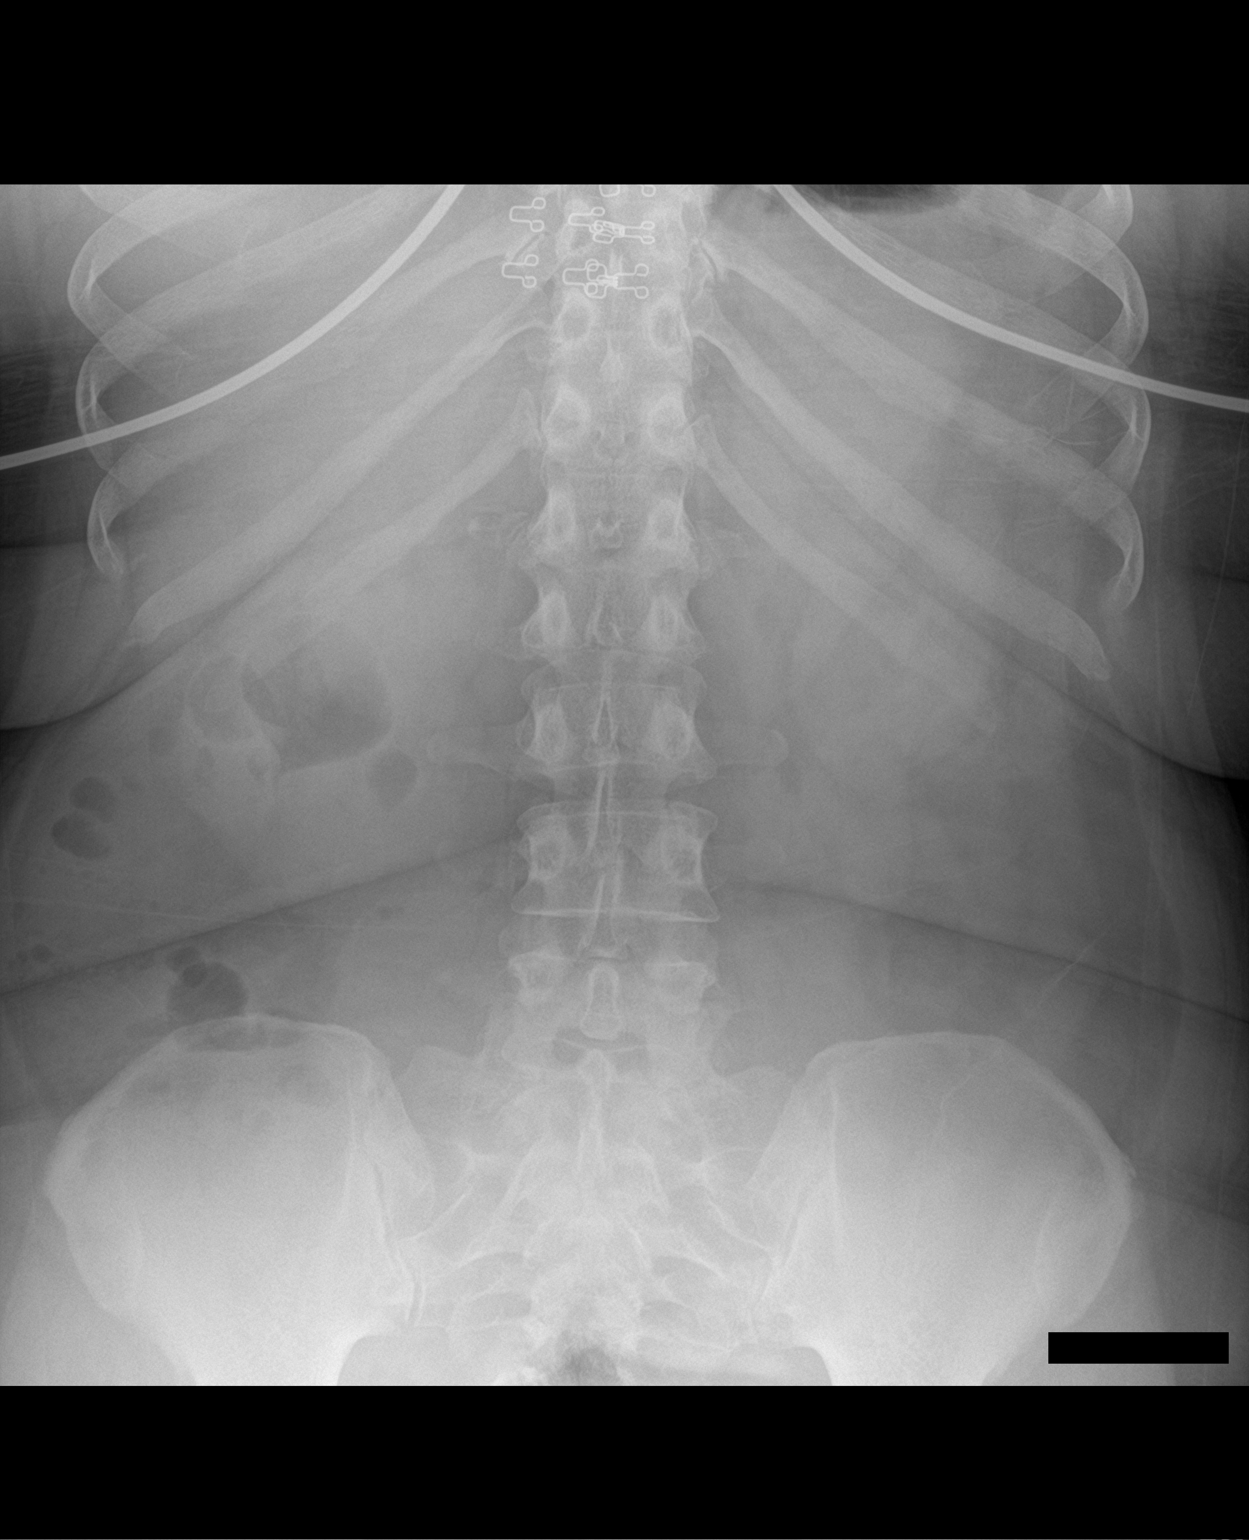

[abdomen supine]
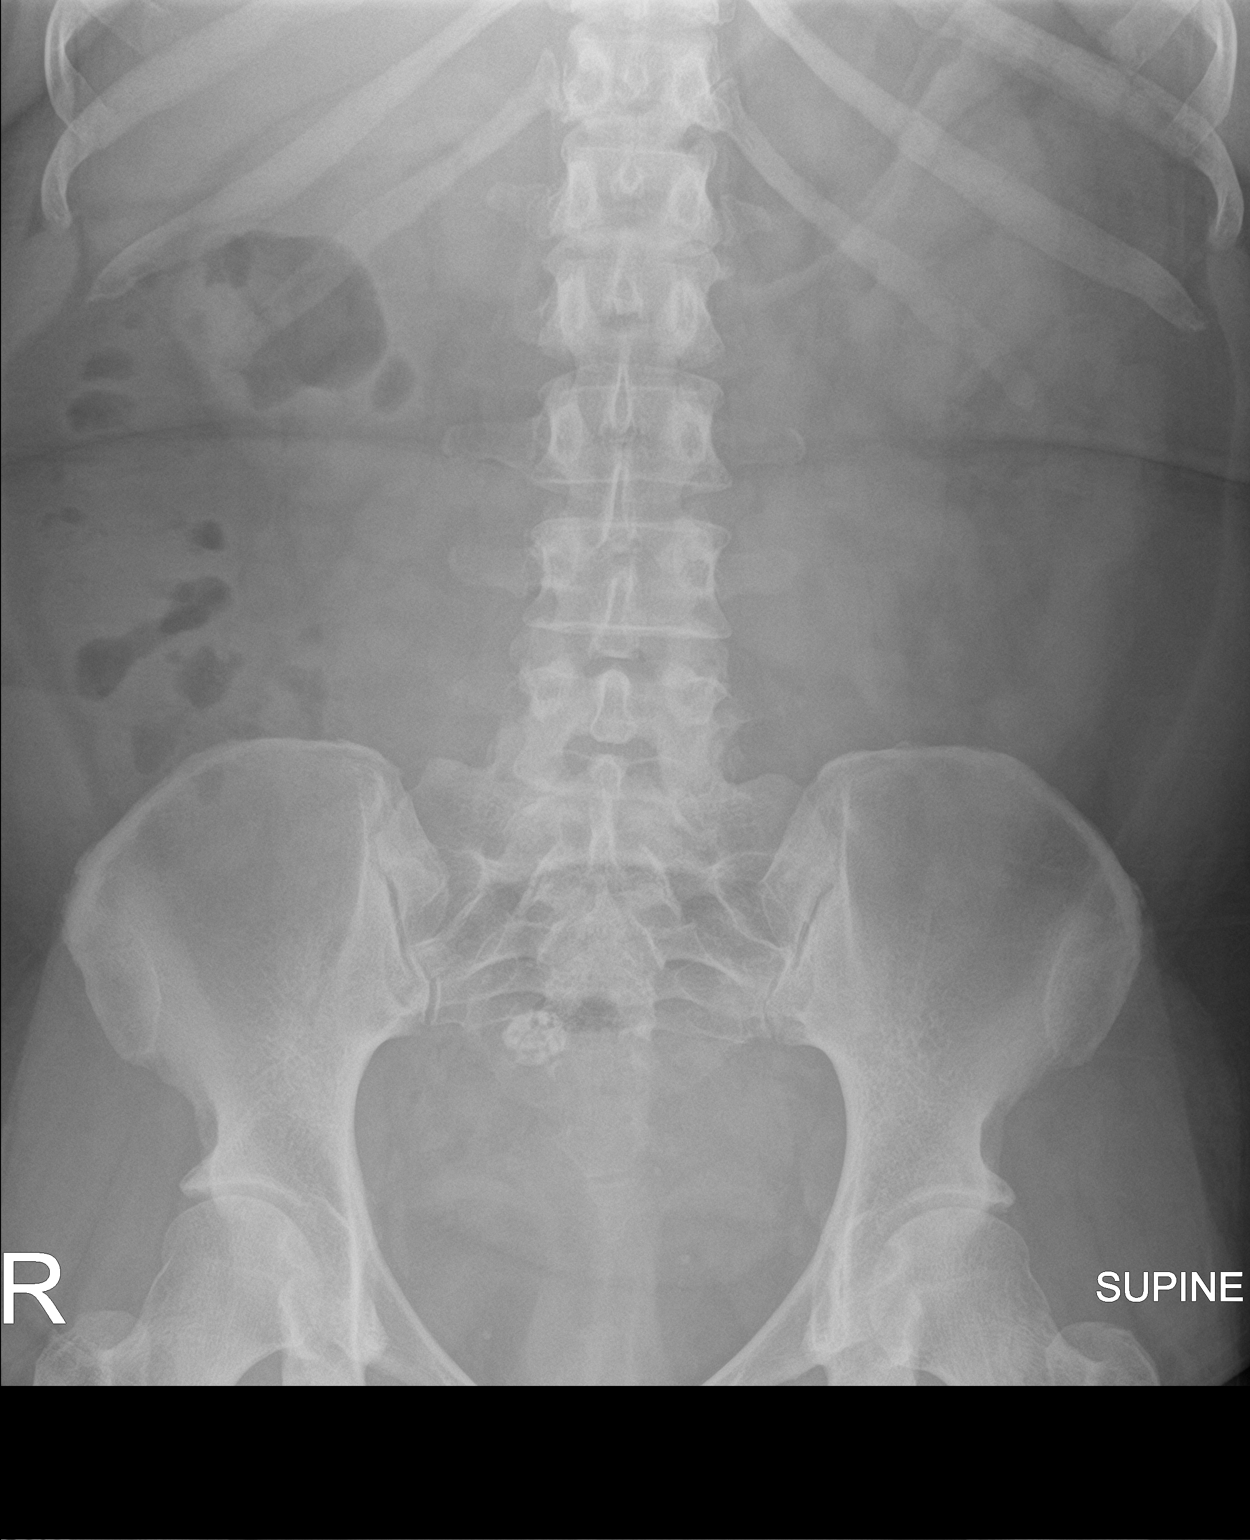

[abdomen erect (2 of 2)]
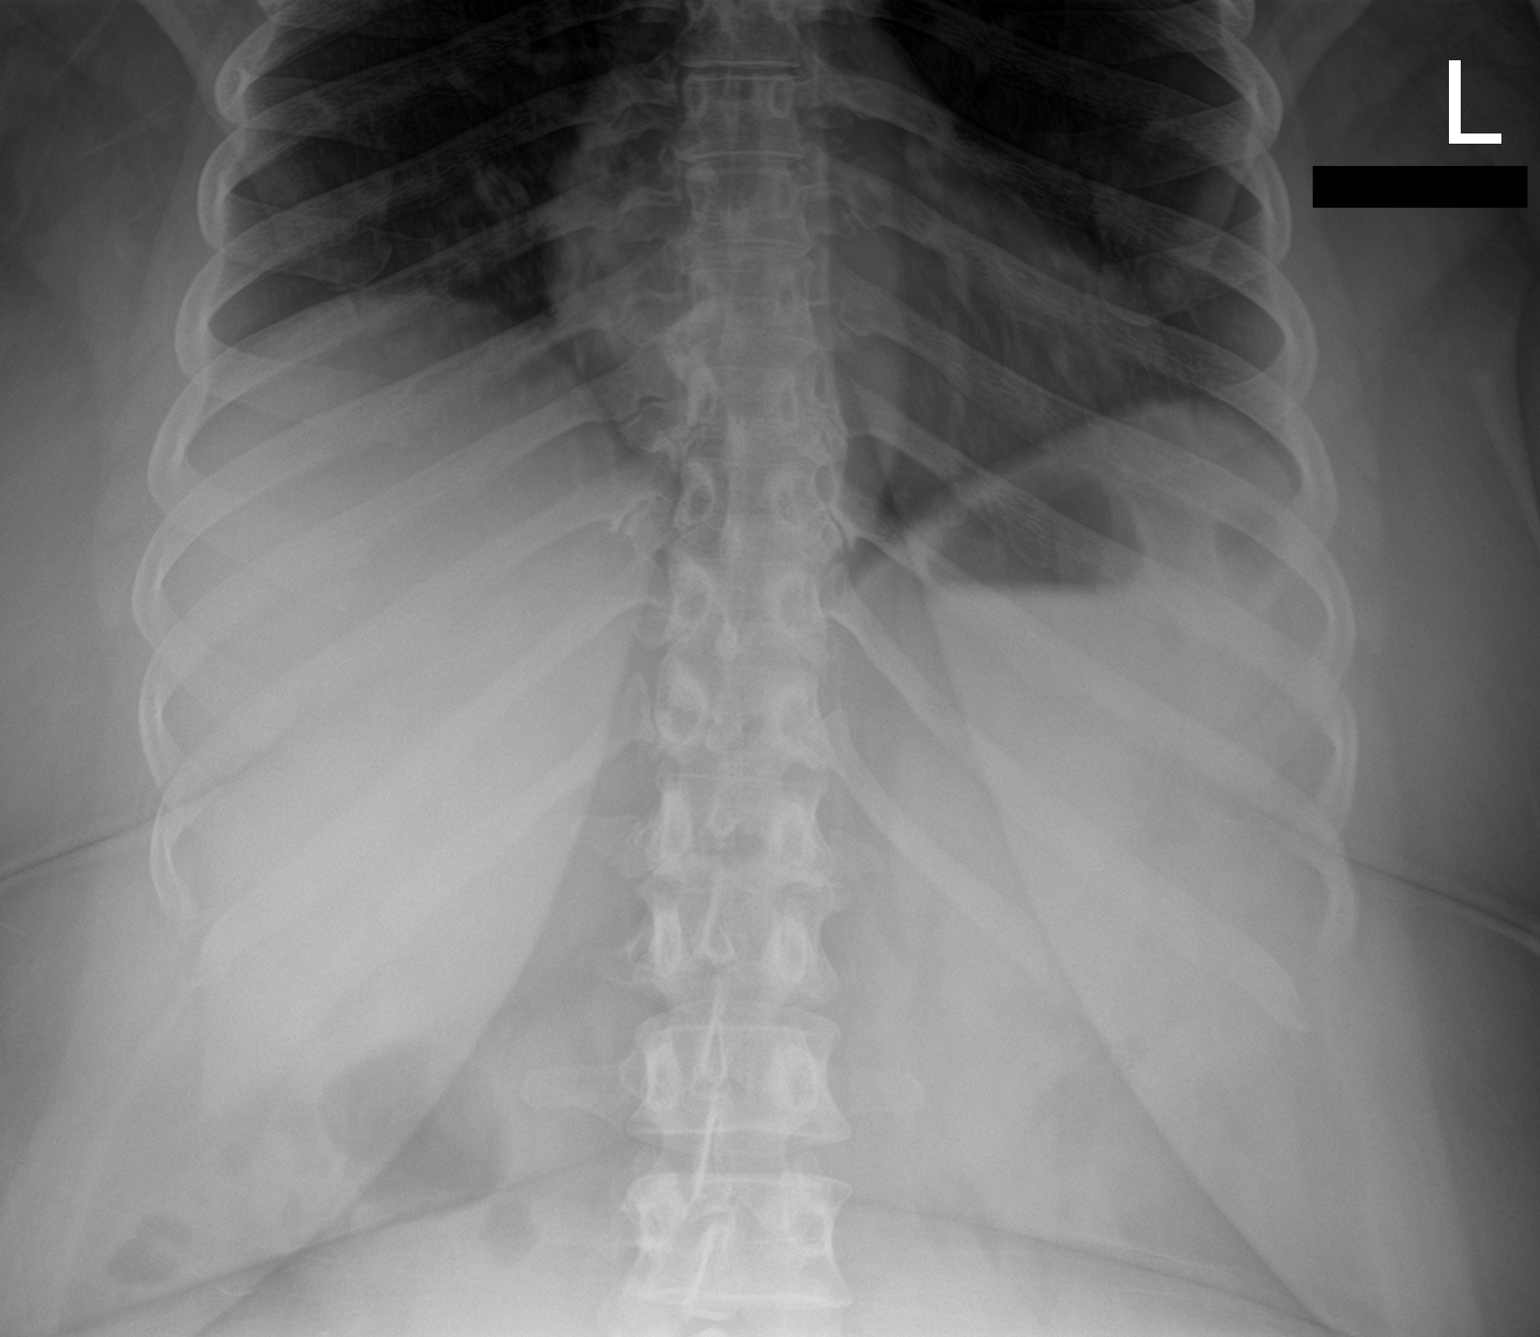

[3 of 3 positions shown; findings below may reference images not displayed]

FINDINGS: Normal bowel gas pattern.

No bowel dilatation or bowel wall thickening.

No free intraperitoneal air.

Calcified uterine leiomyoma in pelvis.

No urinary tract calcification.

Bones unremarkable.
IMPRESSION: Normal bowel gas pattern.

## 2018-02-20 ENCOUNTER — Other Ambulatory Visit: Payer: Self-pay | Admitting: Internal Medicine

## 2018-03-03 ENCOUNTER — Other Ambulatory Visit: Payer: Self-pay | Admitting: Internal Medicine

## 2018-03-03 DIAGNOSIS — J069 Acute upper respiratory infection, unspecified: Secondary | ICD-10-CM

## 2018-03-03 DIAGNOSIS — H6501 Acute serous otitis media, right ear: Secondary | ICD-10-CM

## 2018-03-30 ENCOUNTER — Ambulatory Visit: Payer: 59 | Admitting: Internal Medicine

## 2018-03-30 ENCOUNTER — Encounter: Payer: Self-pay | Admitting: Internal Medicine

## 2018-03-30 VITALS — BP 118/74 | HR 80 | Temp 98.0°F | Ht 63.0 in | Wt 222.0 lb

## 2018-03-30 DIAGNOSIS — E538 Deficiency of other specified B group vitamins: Secondary | ICD-10-CM

## 2018-03-30 DIAGNOSIS — J301 Allergic rhinitis due to pollen: Secondary | ICD-10-CM | POA: Diagnosis not present

## 2018-03-30 DIAGNOSIS — I10 Essential (primary) hypertension: Secondary | ICD-10-CM

## 2018-03-30 DIAGNOSIS — G4726 Circadian rhythm sleep disorder, shift work type: Secondary | ICD-10-CM

## 2018-03-30 DIAGNOSIS — Z23 Encounter for immunization: Secondary | ICD-10-CM

## 2018-03-30 MED ORDER — METHYLPREDNISOLONE ACETATE 80 MG/ML IJ SUSP
80.0000 mg | Freq: Once | INTRAMUSCULAR | Status: AC
  Administered 2018-03-30: 80 mg via INTRAMUSCULAR

## 2018-03-30 NOTE — Progress Notes (Signed)
Subjective:  Patient ID: [name], female    DOB: [birth year]  Age: 47 y.o. MRN: [id number]  CC: No chief complaint on file.   HPI Arden Axon presents for shift work, fatigue. Nuvigil helped F/u stress, DM. C/o allergies - worse  Outpatient Medications Prior to Visit  Medication Sig Dispense Refill  . ALPRAZolam (XANAX) 0.5 MG tablet Take 1-2 tablets (0.5-1 mg total) by mouth at bedtime. 3 months supply 180 tablet 1  . Armodafinil (NUVIGIL) 150 MG tablet Take 1 tablet (150 mg total) by mouth daily. IZ:TIWPY work 30 tablet 5  . cetirizine (ZYRTEC) 10 MG tablet Take 10 mg by mouth daily.    . Cholecalciferol (EQL VITAMIN D3) 1000 UNITS tablet Take 1,000 Units by mouth daily.      . cyclobenzaprine (FLEXERIL) 10 MG tablet Take 1-2 tablets (10-20 mg total) by mouth at bedtime. Take 1-2 qhs 180 tablet 3  . fluticasone (FLONASE) 50 MCG/ACT nasal spray SPRAY 2 SPRAYS INTO EACH NOSTRIL EVERY DAY 16 g 3  . metFORMIN (GLUCOPHAGE) 500 MG tablet Take 1 tablet (500 mg total) by mouth daily with breakfast. 90 tablet 3  . naproxen (NAPROSYN) 500 MG tablet Take 1 tablet (500 mg total) by mouth 2 (two) times daily with a meal. Use prn 60 tablet 3  . Olmesartan-amLODIPine-HCTZ 40-10-25 MG TABS Take 1 tablet by mouth daily. 90 tablet 3  . oxymetazoline (AFRIN NASAL SPRAY) 0.05 % nasal spray Place 1 spray into both nostrils 2 (two) times daily. Use only for 3days, then stop 30 mL 0  . Prenatal Vit-Fe Fumarate-FA (PRENATAL VITAMIN PLUS LOW IRON) 27-1 MG TABS TAKE 1 TABLET BY MOUTH EVERY DAY 90 tablet 3   No facility-administered medications prior to visit.     ROS: Review of Systems  Constitutional: Positive for fatigue. Negative for activity change, appetite change, chills and unexpected weight change.  HENT: Negative for congestion, mouth sores and sinus pressure.   Eyes: Negative for visual disturbance.  Respiratory: Negative for cough and chest tightness.   Gastrointestinal: Negative  for abdominal pain and nausea.  Genitourinary: Negative for difficulty urinating, frequency and vaginal pain.  Musculoskeletal: Positive for arthralgias and back pain. Negative for gait problem.  Skin: Negative for pallor and rash.  Neurological: Negative for dizziness, tremors, weakness, numbness and headaches.  Psychiatric/Behavioral: Positive for sleep disturbance. Negative for confusion and suicidal ideas.    Objective:  BP 118/74 (BP Location: Left Arm, Patient Position: Sitting, Cuff Size: Large)   Pulse 80   Temp 98 F (36.7 C) (Oral)   Ht 5\' 3"  (1.6 m)   Wt 222 lb (100.7 kg)   SpO2 98%   BMI 39.33 kg/m   BP Readings from Last 3 Encounters:  03/30/18 118/74  02/05/18 120/82  12/21/17 109/73    Wt Readings from Last 3 Encounters:  03/30/18 222 lb (100.7 kg)  02/05/18 223 lb (101.2 kg)  12/21/17 217 lb (98.4 kg)    Physical Exam  Constitutional: She appears well-developed. No distress.  HENT:  Head: Normocephalic.  Right Ear: External ear normal.  Left Ear: External ear normal.  Nose: Nose normal.  Mouth/Throat: Oropharynx is clear and moist.  Eyes: Pupils are equal, round, and reactive to light. Conjunctivae are normal. Right eye exhibits no discharge. Left eye exhibits no discharge.  Neck: Normal range of motion. Neck supple. No JVD present. No tracheal deviation present. No thyromegaly present.  Cardiovascular: Normal rate, regular rhythm and normal heart sounds.  Pulmonary/Chest: No stridor.  No respiratory distress. She has no wheezes.  Abdominal: Soft. Bowel sounds are normal. She exhibits no distension and no mass. There is no tenderness. There is no rebound and no guarding.  Musculoskeletal: She exhibits no edema or tenderness.  Lymphadenopathy:    She has no cervical adenopathy.  Neurological: She displays normal reflexes. No cranial nerve deficit. She exhibits normal muscle tone. Coordination normal.  Skin: No rash noted. No erythema.  Psychiatric: She  has a normal mood and affect. Her behavior is normal. Judgment and thought content normal.  Obese Swollen nasal mucosa Dark circles around eyes  Lab Results  Component Value Date   WBC 4.8 02/05/2018   HGB 12.3 02/05/2018   HCT 37.1 02/05/2018   PLT 135.0 (L) 02/05/2018   GLUCOSE 93 02/05/2018   CHOL 180 09/30/2017   TRIG 67.0 09/30/2017   HDL 56.30 09/30/2017   LDLCALC 111 (H) 09/30/2017   ALT 14 02/05/2018   AST 18 02/05/2018   NA 139 02/05/2018   K 3.8 02/05/2018   CL 104 02/05/2018   CREATININE 0.57 02/05/2018   BUN 10 02/05/2018   CO2 28 02/05/2018   TSH 0.61 02/05/2018   INR 0.9 RATIO 03/15/2007   HGBA1C 5.8 02/05/2018    Dg Chest 2 View  Result Date: 08/24/2017 CLINICAL DATA:  Chest pain. EXAM: CHEST  2 VIEW COMPARISON:  None. FINDINGS: Normal cardiac silhouette and mediastinal contours. There is mild thickening of the pulmonary interstitium, most conspicuous about the bilateral pulmonary hila. No discrete focal airspace opacities. No pleural effusion or pneumothorax. No evidence of edema. No acute osseus abnormalities. IMPRESSION: Findings suggestive of airways disease / bronchitis. No focal airspace opacities to suggest pneumonia. Electronically Signed   By: Sandi Mariscal M.D.   On: 08/24/2017 09:23    Assessment & Plan:   There are no diagnoses linked to this encounter.   No orders of the defined types were placed in this encounter.    Follow-up: No follow-ups on file.  Walker Kehr, MD

## 2018-03-30 NOTE — Assessment & Plan Note (Addendum)
Nuvigil is helping Stress is worse Needs to limit work hours to <55 h/wk

## 2018-03-30 NOTE — Assessment & Plan Note (Signed)
On B12 

## 2018-03-30 NOTE — Assessment & Plan Note (Signed)
Zyrtec, Flonase Depo-medrol 80 mg IM

## 2018-03-30 NOTE — Assessment & Plan Note (Signed)
[place] 

## 2018-03-30 NOTE — Addendum Note (Signed)
Addended by: Karren Cobble on: 03/30/2018 09:24 AM   Modules accepted: Orders

## 2018-05-31 ENCOUNTER — Other Ambulatory Visit: Payer: Self-pay | Admitting: Internal Medicine

## 2018-05-31 DIAGNOSIS — J069 Acute upper respiratory infection, unspecified: Secondary | ICD-10-CM

## 2018-05-31 DIAGNOSIS — H6501 Acute serous otitis media, right ear: Secondary | ICD-10-CM

## 2018-06-02 ENCOUNTER — Ambulatory Visit: Payer: Self-pay | Admitting: *Deleted

## 2018-06-02 NOTE — Telephone Encounter (Signed)
Pt called in c/o a headache for the last 2 days.   It's not usual for her to have headaches.   She is using Excedrin which is helping.  She also c/o feeling mildly lightheaded.    See triage notes.  I made her an appt with Dr. Alain Marion for 06/03/18 at 11:20..   I went over the care advice for her some things to do prior to her appt and s/s to look for to go to the ED.    She is going to try some Naproxen that she has a Rx for this morning to see if it helps.   It makes her sleepy so she can't take it and work.   She works the 2nd shift. She was agreeable with this plan.   Reason for Disposition . [1] MODERATE headache (e.g., interferes with normal activities) AND [2] present > 24 hours AND [3] unexplained  (Exceptions: analgesics not tried, typical migraine, or headache part of viral illness)  Answer Assessment - Initial Assessment Questions 1. LOCATION: "Where does it hurt?"      I've had a headache for 2 days.   Back of my neck and shoulders and feel lightheaded.  132/53 BP last night.    I just feel funny. 2. ONSET: "When did the headache start?" (Minutes, hours or days)      Day before yesterday 3. PATTERN: "Does the pain come and go, or has it been constant since it started?"     I took 2 Excedrin it went away.   It returned yesterday I took another Excedrin.   But last night at work the headache really came on strong. 4. SEVERITY: "How bad is the pain?" and "What does it keep you from doing?"  (e.g., Scale 1-10; mild, moderate, or severe)   - MILD (1-3): doesn't interfere with normal activities    - MODERATE (4-7): interferes with normal activities or awakens from sleep    - SEVERE (8-10): excruciating pain, unable to do any normal activities        I feel lightheaded.    5 on scale now. 5. RECURRENT SYMPTOM: "Have you ever had headaches before?" If so, ask: "When was the last time?" and "What happened that time?"      Many years ago.     6. CAUSE: "What do you think is causing the  headache?"     I don't know. 7. MIGRAINE: "Have you been diagnosed with migraine headaches?" If so, ask: "Is this headache similar?"      As a teenager I had migraines. 8. HEAD INJURY: "Has there been any recent injury to the head?"      No injuries 9. OTHER SYMPTOMS: "Do you have any other symptoms?" (fever, stiff neck, eye pain, sore throat, cold symptoms)     No. 10. PREGNANCY: "Is there any chance you are pregnant?" "When was your last menstrual period?"       No!  Protocols used: [facility]

## 2018-06-03 ENCOUNTER — Encounter: Payer: Self-pay | Admitting: Internal Medicine

## 2018-06-03 ENCOUNTER — Ambulatory Visit: Payer: 59 | Admitting: Internal Medicine

## 2018-06-03 VITALS — BP 116/72 | HR 63 | Temp 98.1°F | Ht 63.0 in | Wt 223.0 lb

## 2018-06-03 DIAGNOSIS — G43009 Migraine without aura, not intractable, without status migrainosus: Secondary | ICD-10-CM | POA: Diagnosis not present

## 2018-06-03 DIAGNOSIS — R002 Palpitations: Secondary | ICD-10-CM

## 2018-06-03 MED ORDER — RIZATRIPTAN BENZOATE 10 MG PO TABS: 10.0000 mg | ORAL_TABLET | Freq: Once | ORAL | 5 refills | Status: DC | PRN

## 2018-06-03 MED ORDER — ONDANSETRON HCL 4 MG PO TABS: 4.0000 mg | ORAL_TABLET | Freq: Three times a day (TID) | ORAL | 1 refills | Status: DC | PRN

## 2018-06-03 MED ORDER — IBUPROFEN 600 MG PO TABS: ORAL_TABLET | ORAL | 2 refills | Status: DC

## 2018-06-03 MED ORDER — METFORMIN HCL ER 500 MG PO TB24: 500.0000 mg | ORAL_TABLET | Freq: Every day | ORAL | 3 refills | Status: DC

## 2018-06-03 NOTE — Progress Notes (Signed)
Subjective:  Patient ID: [name], female    DOB: Oct 21, 1970  Age: 47 y.o. MRN: [id number]  CC: No chief complaint on file.   HPI [name] presents for severe HAs this week w/nausea off and on, R>>L side Aleve, Excedrin helped  Outpatient Medications Prior to Visit  Medication Sig Dispense Refill  . ALPRAZolam (XANAX) 0.5 MG tablet Take 1-2 tablets (0.5-1 mg total) by mouth at bedtime. 3 months supply 180 tablet 1  . Armodafinil (NUVIGIL) 150 MG tablet Take 1 tablet (150 mg total) by mouth daily. NW:GNFAO work 30 tablet 5  . cetirizine (ZYRTEC) 10 MG tablet Take 10 mg by mouth daily.    . Cholecalciferol (EQL VITAMIN D3) 1000 UNITS tablet Take 1,000 Units by mouth daily.      . cyclobenzaprine (FLEXERIL) 10 MG tablet Take 1-2 tablets (10-20 mg total) by mouth at bedtime. Take 1-2 qhs 180 tablet 3  . fluticasone (FLONASE) 50 MCG/ACT nasal spray SPRAY 2 SPRAYS INTO EACH NOSTRIL EVERY DAY 48 g 11  . metFORMIN (GLUCOPHAGE) 500 MG tablet Take 1 tablet (500 mg total) by mouth daily with breakfast. 90 tablet 3  . naproxen (NAPROSYN) 500 MG tablet Take 1 tablet (500 mg total) by mouth 2 (two) times daily with a meal. Use prn 60 tablet 3  . Olmesartan-amLODIPine-HCTZ 40-10-25 MG TABS Take 1 tablet by mouth daily. 90 tablet 3  . oxymetazoline (AFRIN NASAL SPRAY) 0.05 % nasal spray Place 1 spray into both nostrils 2 (two) times daily. Use only for 3days, then stop 30 mL 0  . Prenatal Vit-Fe Fumarate-FA (PRENATAL VITAMIN PLUS LOW IRON) 27-1 MG TABS TAKE 1 TABLET BY MOUTH EVERY DAY 90 tablet 3   No facility-administered medications prior to visit.     ROS: Review of Systems  Constitutional: Positive for fatigue. Negative for activity change, appetite change, chills and unexpected weight change.  HENT: Negative for congestion, mouth sores and sinus pressure.   Eyes: Negative for visual disturbance.  Respiratory: Negative for cough, chest tightness and wheezing.   Cardiovascular:  Positive for palpitations. Negative for chest pain and leg swelling.  Gastrointestinal: Positive for nausea. Negative for abdominal pain.  Genitourinary: Negative for difficulty urinating, frequency and vaginal pain.  Musculoskeletal: Negative for back pain and gait problem.  Skin: Negative for pallor and rash.  Neurological: Positive for headaches. Negative for dizziness, tremors, weakness and numbness.  Psychiatric/Behavioral: Negative for confusion and sleep disturbance.    Objective:  BP 116/72 (BP Location: Left Arm, Patient Position: Sitting, Cuff Size: Large)   Pulse 63   Temp 98.1 F (36.7 C) (Oral)   Ht 5\' 3"  (1.6 m)   Wt 223 lb (101.2 kg)   SpO2 98%   BMI 39.50 kg/m   BP Readings from Last 3 Encounters:  06/03/18 116/72  03/30/18 118/74  02/05/18 120/82    Wt Readings from Last 3 Encounters:  06/03/18 223 lb (101.2 kg)  03/30/18 222 lb (100.7 kg)  02/05/18 223 lb (101.2 kg)    Physical Exam  Constitutional: She appears well-developed. No distress.  HENT:  Head: Normocephalic.  Right Ear: External ear normal.  Left Ear: External ear normal.  Nose: Nose normal.  Mouth/Throat: Oropharynx is clear and moist.  Eyes: Pupils are equal, round, and reactive to light. Conjunctivae are normal. Right eye exhibits no discharge. Left eye exhibits no discharge.  Neck: Normal range of motion. Neck supple. No JVD present. No tracheal deviation present. No thyromegaly present.  Cardiovascular: Normal rate,  regular rhythm and normal heart sounds.  Pulmonary/Chest: No stridor. No respiratory distress. She has no wheezes.  Abdominal: Soft. Bowel sounds are normal. She exhibits no distension and no mass. There is no tenderness. There is no rebound and no guarding.  Musculoskeletal: She exhibits no edema or tenderness.  Lymphadenopathy:    She has no cervical adenopathy.  Neurological: She displays normal reflexes. No cranial nerve deficit. She exhibits normal muscle tone.  Coordination normal.  Skin: No rash noted. No erythema.  Psychiatric: She has a normal mood and affect. Her behavior is normal. Judgment and thought content normal.  looks tired Neck supple  Procedure: EKG Indication: palpitrations Impression: NSR. No acute changes.   Lab Results  Component Value Date   WBC 4.8 02/05/2018   HGB 12.3 02/05/2018   HCT 37.1 02/05/2018   PLT 135.0 (L) 02/05/2018   GLUCOSE 93 02/05/2018   CHOL 180 09/30/2017   TRIG 67.0 09/30/2017   HDL 56.30 09/30/2017   LDLCALC 111 (H) 09/30/2017   ALT 14 02/05/2018   AST 18 02/05/2018   NA 139 02/05/2018   K 3.8 02/05/2018   CL 104 02/05/2018   CREATININE 0.57 02/05/2018   BUN 10 02/05/2018   CO2 28 02/05/2018   TSH 0.61 02/05/2018   INR 0.9 RATIO 03/15/2007   HGBA1C 5.8 02/05/2018    Dg Chest 2 View  Result Date: 08/24/2017 CLINICAL DATA:  Chest pain. EXAM: CHEST  2 VIEW COMPARISON:  None. FINDINGS: Normal cardiac silhouette and mediastinal contours. There is mild thickening of the pulmonary interstitium, most conspicuous about the bilateral pulmonary hila. No discrete focal airspace opacities. No pleural effusion or pneumothorax. No evidence of edema. No acute osseus abnormalities. IMPRESSION: Findings suggestive of airways disease / bronchitis. No focal airspace opacities to suggest pneumonia. Electronically Signed   By: Sandi Mariscal M.D.   On: 08/24/2017 09:23    Assessment & Plan:   There are no diagnoses linked to this encounter.   No orders of the defined types were placed in this encounter.    Follow-up: No follow-ups on file.  Walker Kehr, MD

## 2018-06-03 NOTE — Patient Instructions (Signed)

## 2018-06-03 NOTE — Assessment & Plan Note (Signed)
Maxalt Zofran Ibupr 600 mg prn

## 2018-06-17 DIAGNOSIS — N9489 Other specified conditions associated with female genital organs and menstrual cycle: Secondary | ICD-10-CM | POA: Diagnosis not present

## 2018-07-13 DIAGNOSIS — N951 Menopausal and female climacteric states: Secondary | ICD-10-CM | POA: Diagnosis not present

## 2018-08-02 ENCOUNTER — Ambulatory Visit: Payer: 59 | Admitting: Internal Medicine

## 2018-08-07 ENCOUNTER — Other Ambulatory Visit: Payer: Self-pay | Admitting: Internal Medicine

## 2018-08-26 DIAGNOSIS — Z0100 Encounter for examination of eyes and vision without abnormal findings: Secondary | ICD-10-CM | POA: Diagnosis not present

## 2018-10-03 ENCOUNTER — Other Ambulatory Visit: Payer: Self-pay | Admitting: Internal Medicine

## 2018-10-05 ENCOUNTER — Ambulatory Visit: Payer: Self-pay

## 2018-10-05 NOTE — Telephone Encounter (Signed)
Pt. Wanted to review home remedies for her cough. Verbalizes understanding. Does have an appointment for this week. Instructed to call back if symptoms worsen.  Reason for Disposition . Cough  Answer Assessment - Initial Assessment Questions 1. ONSET: "When did the cough begin?"      Started Saturday 2. SEVERITY: "How bad is the cough today?"      Moderate 3. RESPIRATORY DISTRESS: "Describe your breathing."      No distress 4. FEVER: "Do you have a fever?" If so, ask: "What is your temperature, how was it measured, and when did it start?"     No 5. SPUTUM: "Describe the color of your sputum" (clear, white, yellow, green)     Brownish 6. HEMOPTYSIS: "Are you coughing up any blood?" If so ask: "How much?" (flecks, streaks, tablespoons, etc.)     No 7. CARDIAC HISTORY: "Do you have any history of heart disease?" (e.g., heart attack, congestive heart failure)      HTN  8. LUNG HISTORY: "Do you have any history of lung disease?"  (e.g., pulmonary embolus, asthma, emphysema)     No 9. PE RISK FACTORS: "Do you have a history of blood clots?" (or: recent major surgery, recent prolonged travel, bedridden)     No 10. OTHER SYMPTOMS: "Do you have any other symptoms?" (e.g., runny nose, wheezing, chest pain)       Cough 11. PREGNANCY: "Is there any chance you are pregnant?" "When was your last menstrual period?"       Yes 12. TRAVEL: "Have you traveled out of the country in the last month?" (e.g., travel history, exposures)       No  Protocols used: [place]

## 2018-10-06 DIAGNOSIS — Z01419 Encounter for gynecological examination (general) (routine) without abnormal findings: Secondary | ICD-10-CM | POA: Diagnosis not present

## 2018-10-06 DIAGNOSIS — Z1231 Encounter for screening mammogram for malignant neoplasm of breast: Secondary | ICD-10-CM | POA: Diagnosis not present

## 2018-10-07 ENCOUNTER — Ambulatory Visit: Payer: 59 | Admitting: Nurse Practitioner

## 2018-10-08 ENCOUNTER — Ambulatory Visit: Payer: 59 | Admitting: Nurse Practitioner

## 2018-11-23 ENCOUNTER — Encounter: Payer: Self-pay | Admitting: Internal Medicine

## 2018-11-23 ENCOUNTER — Ambulatory Visit (INDEPENDENT_AMBULATORY_CARE_PROVIDER_SITE_OTHER): Payer: 59 | Admitting: Internal Medicine

## 2018-11-23 DIAGNOSIS — E559 Vitamin D deficiency, unspecified: Secondary | ICD-10-CM

## 2018-11-23 DIAGNOSIS — M545 Low back pain, unspecified: Secondary | ICD-10-CM

## 2018-11-23 DIAGNOSIS — G5603 Carpal tunnel syndrome, bilateral upper limbs: Secondary | ICD-10-CM

## 2018-11-23 DIAGNOSIS — E538 Deficiency of other specified B group vitamins: Secondary | ICD-10-CM

## 2018-11-23 DIAGNOSIS — I10 Essential (primary) hypertension: Secondary | ICD-10-CM | POA: Diagnosis not present

## 2018-11-23 DIAGNOSIS — G8929 Other chronic pain: Secondary | ICD-10-CM

## 2018-11-23 MED ORDER — ALPRAZOLAM 0.5 MG PO TABS: 0.5000 mg | ORAL_TABLET | Freq: Every day | ORAL | 0 refills | Status: DC

## 2018-11-23 MED ORDER — CYCLOBENZAPRINE HCL 5 MG PO TABS: 5.0000 mg | ORAL_TABLET | Freq: Three times a day (TID) | ORAL | 2 refills | Status: DC | PRN

## 2018-11-23 MED ORDER — NAPROXEN 500 MG PO TABS: 500.0000 mg | ORAL_TABLET | Freq: Two times a day (BID) | ORAL | 3 refills | Status: DC | PRN

## 2018-11-23 NOTE — Assessment & Plan Note (Signed)
Worse lately. Use bilateral wrist splints at night. Continue with naproxen as needed Check TSH, B12 level, vitamin D level

## 2018-11-23 NOTE — Assessment & Plan Note (Signed)
Acute on chronic low back pain.  Added Flexeril.  Exercise for range of motion.  [name] brought herself a new mattress

## 2018-11-23 NOTE — Assessment & Plan Note (Signed)
Continue with Azor 

## 2018-11-23 NOTE — Progress Notes (Signed)
Virtual Visit via Telephone Note  I connected with [name] on 11/23/18 at  2:00 PM EDT by telephone and verified that I am speaking with the correct person using two identifiers.   I discussed the limitations, risks, security and privacy concerns of performing an evaluation and management service by telephone and the availability of in person appointments. I also discussed with the patient that there may be a patient responsible charge related to this service. The patient expressed understanding and agreed to proceed.   History of Present Illness:   [name] is complaining of bilateral hand numbness at night.  She has a history of carpal tunnel syndrome.  She has worn a wrist splint to wear.  She continues to work 12-hour shifts.  She continues to have back pain.  She is taking vitamin B12.  She is taking 1 prenatal vitamin Observations/Objective:  She is no acute distress Assessment and Plan:  See plan Follow Up Instructions:    I discussed the assessment and treatment plan with the patient. The patient was provided an opportunity to ask questions and all were answered. The patient agreed with the plan and demonstrated an understanding of the instructions.   The patient was advised to call back or seek an in-person evaluation if the symptoms worsen or if the condition fails to improve as anticipated.  I provided 20 minutes of non-face-to-face time during this encounter.   Walker Kehr, MD

## 2018-11-23 NOTE — Assessment & Plan Note (Signed)
Will check vitamin-D level.

## 2018-11-23 NOTE — Assessment & Plan Note (Signed)
[name] has not been taking vitamin B12 separately; she is on 1 prenatal vitamin a day. We will check a vitamin B12 level

## 2018-11-25 ENCOUNTER — Other Ambulatory Visit (INDEPENDENT_AMBULATORY_CARE_PROVIDER_SITE_OTHER): Payer: 59

## 2018-11-25 DIAGNOSIS — G5603 Carpal tunnel syndrome, bilateral upper limbs: Secondary | ICD-10-CM

## 2018-11-25 DIAGNOSIS — G8929 Other chronic pain: Secondary | ICD-10-CM | POA: Diagnosis not present

## 2018-11-25 DIAGNOSIS — M545 Low back pain, unspecified: Secondary | ICD-10-CM

## 2018-11-25 DIAGNOSIS — E559 Vitamin D deficiency, unspecified: Secondary | ICD-10-CM | POA: Diagnosis not present

## 2018-11-25 DIAGNOSIS — E538 Deficiency of other specified B group vitamins: Secondary | ICD-10-CM

## 2018-11-25 LAB — VITAMIN B12: Vitamin B-12: 454 pg/mL (ref 211–911)

## 2018-11-25 LAB — BASIC METABOLIC PANEL
BUN: 15 mg/dL (ref 6–23)
CO2: 28 mEq/L (ref 19–32)
Calcium: 9.1 mg/dL (ref 8.4–10.5)
Chloride: 102 mEq/L (ref 96–112)
Creatinine, Ser: 0.63 mg/dL (ref 0.40–1.20)
GFR: 121.63 mL/min (ref >60.00)
Glucose, Bld: 102 mg/dL — ABNORMAL HIGH (ref 70–99)
Potassium: 3.4 mEq/L — ABNORMAL LOW (ref 3.5–5.1)
Sodium: 138 mEq/L (ref 135–145)

## 2018-11-25 LAB — HEPATIC FUNCTION PANEL
ALT: 12 U/L (ref 0–35)
AST: 16 U/L (ref 0–37)
Albumin: 4 g/dL (ref 3.5–5.2)
Alkaline Phosphatase: 44 U/L (ref 39–117)
Bilirubin, Direct: 0.1 mg/dL (ref 0.0–0.3)
Total Bilirubin: 0.3 mg/dL (ref 0.2–1.2)
Total Protein: 7.2 g/dL (ref 6.0–8.3)

## 2018-11-25 LAB — CBC WITH DIFFERENTIAL/PLATELET
Basophils Absolute: 0 10*3/uL (ref 0.0–0.1)
Basophils Relative: 0.7 % (ref 0.0–3.0)
Eosinophils Absolute: 0.1 10*3/uL (ref 0.0–0.7)
Eosinophils Relative: 2 % (ref 0.0–5.0)
HCT: 38.8 % (ref 36.0–46.0)
Hemoglobin: 13 g/dL (ref 12.0–15.0)
Lymphocytes Relative: 30.3 % (ref 12.0–46.0)
Lymphs Abs: 1.8 10*3/uL (ref 0.7–4.0)
MCHC: 33.4 g/dL (ref 30.0–36.0)
MCV: 80.5 fl (ref 78.0–100.0)
Monocytes Absolute: 0.6 10*3/uL (ref 0.1–1.0)
Monocytes Relative: 10 % (ref 3.0–12.0)
Neutro Abs: 3.4 10*3/uL (ref 1.4–7.7)
Neutrophils Relative %: 57 % (ref 43.0–77.0)
Platelets: 139 10*3/uL — ABNORMAL LOW (ref 150.0–400.0)
RBC: 4.82 Mil/uL (ref 3.87–5.11)
RDW: 15.3 % (ref 11.5–15.5)
WBC: 5.9 10*3/uL (ref 4.0–10.5)

## 2018-11-25 LAB — VITAMIN D 25 HYDROXY (VIT D DEFICIENCY, FRACTURES): VITD: 34.82 ng/mL (ref 30.00–100.00)

## 2018-11-25 LAB — HEMOGLOBIN A1C: Hgb A1c MFr Bld: 6 % (ref 4.6–6.5)

## 2018-11-25 LAB — TSH: TSH: 0.69 u[IU]/mL (ref 0.35–4.50)

## 2018-12-06 ENCOUNTER — Ambulatory Visit (INDEPENDENT_AMBULATORY_CARE_PROVIDER_SITE_OTHER): Payer: 59 | Admitting: Internal Medicine

## 2018-12-06 ENCOUNTER — Encounter: Payer: Self-pay | Admitting: Internal Medicine

## 2018-12-06 DIAGNOSIS — G5603 Carpal tunnel syndrome, bilateral upper limbs: Secondary | ICD-10-CM | POA: Diagnosis not present

## 2018-12-06 NOTE — Progress Notes (Signed)
Virtual Visit via Video Note  I connected with [name] on 12/06/18 at  8:30 AM EDT by a video enabled telemedicine application and verified that I am speaking with the correct person using two identifiers.   I discussed the limitations of evaluation and management by telemedicine and the availability of in person appointments. The patient expressed understanding and agreed to proceed.  History of Present Illness:   [name] is complaining of pain and numbness in both wrists and hands.  The problem is worse at work.  She has been working long hours.  She has been wearing braces at night for over a week.  She was about to wear a brace at work starting yesterday.  She has been taking naproxen.  The hardest part for her at work at to do a lot of twisting.  No chest pain, shortness of breath, fever, cough. Observations/Objective: [name] is in no acute distress  Assessment and Plan: See plan  Follow Up Instructions:    I discussed the assessment and treatment plan with the patient. The patient was provided an opportunity to ask questions and all were answered. The patient agreed with the plan and demonstrated an understanding of the instructions.   The patient was advised to call back or seek an in-person evaluation if the symptoms worsen or if the condition fails to improve as anticipated.  I provided 15 minutes of non-face-to-face time during this encounter.   Walker Kehr, MD

## 2018-12-06 NOTE — Assessment & Plan Note (Signed)
Not better.  Continue with naproxen.  Continue to use braces on both wrists at night and at work.  Will refer to orthopedic surgery.  We may need to take Clytie off work to get a break.

## 2018-12-21 ENCOUNTER — Telehealth: Payer: Self-pay

## 2018-12-21 DIAGNOSIS — G5602 Carpal tunnel syndrome, left upper limb: Secondary | ICD-10-CM | POA: Diagnosis not present

## 2018-12-21 DIAGNOSIS — G5601 Carpal tunnel syndrome, right upper limb: Secondary | ICD-10-CM | POA: Diagnosis not present

## 2018-12-21 NOTE — Telephone Encounter (Signed)
Please advise. Should she let ortho take care of this?  Copied from [place] [id number]. Topic: General - Other >> Dec 20, 2018  9:26 AM [name] A wrote: Reason for CRM: Pt called requesting advice on getting a PT for her hand. She states she had an appt set up before but it conflicted with her job. Pt states it has gotten worse and she would liek to get advise about getting a PT and possibly time out of work. Pt states she would be okay with a cortisone shot as well if that is needed. Please advise.

## 2018-12-22 NOTE — Telephone Encounter (Signed)
[place] [address] [phone]  Mailed box full can't accept any messages          Type Date User Summary Attachment  Provider Comments 12/06/2018 8:43 AM Kandas Oliveto, Evie Lacks, MD Provider Comments -  Note   Refer to Dr. Tamera Punt please.  Diagnosis -- bilateral carpal tunnel syndrome, refractory. Thanks,         The patient was referred to orthopedic surgery.  Please asked Melyssa to call Dr. Bettina Gavia office [facility] orthopedics) and schedule her appointment.  I will arrange for physical therapy and a shot if needed. Thanks

## 2018-12-24 NOTE — Telephone Encounter (Signed)
LM notifying pt

## 2019-01-03 ENCOUNTER — Encounter: Payer: Self-pay | Admitting: Internal Medicine

## 2019-01-03 ENCOUNTER — Ambulatory Visit (INDEPENDENT_AMBULATORY_CARE_PROVIDER_SITE_OTHER): Payer: 59 | Admitting: Internal Medicine

## 2019-01-03 DIAGNOSIS — H1032 Unspecified acute conjunctivitis, left eye: Secondary | ICD-10-CM | POA: Diagnosis not present

## 2019-01-03 DIAGNOSIS — H109 Unspecified conjunctivitis: Secondary | ICD-10-CM | POA: Insufficient documentation

## 2019-01-03 MED ORDER — POLYMYXIN B-TRIMETHOPRIM 10000-0.1 UNIT/ML-% OP SOLN: 1.0000 [drp] | OPHTHALMIC | 0 refills | Status: AC

## 2019-01-03 NOTE — Progress Notes (Signed)
Virtual Visit via Video Note  I connected with [name] on 01/03/19 at  2:20 PM EDT by a video enabled telemedicine application and verified that I am speaking with the correct person using two identifiers.   I discussed the limitations of evaluation and management by telemedicine and the availability of in person appointments. The patient expressed understanding and agreed to proceed.  History of Present Illness: [name] is complaining of the left eye pain, swelling, discharge, photophobia starting yesterday.  No blurred vision.  No fever  There has been no runny nose, cough, chest pain, shortness of breath, abdominal pain, diarrhea, constipation, arthralgias, skin rashes.   Observations/Objective: The patient appears to be in no acute distress, looks well.  Her left eye is red with swollen conjunctiva and a purulent discharge  Assessment and Plan:  See my Assessment and Plan. Follow Up Instructions:    I discussed the assessment and treatment plan with the patient. The patient was provided an opportunity to ask questions and all were answered. The patient agreed with the plan and demonstrated an understanding of the instructions.   The patient was advised to call back or seek an in-person evaluation if the symptoms worsen or if the condition fails to improve as anticipated.  I provided face-to-face time during this encounter. We were at different locations.   Walker Kehr, MD

## 2019-01-03 NOTE — Assessment & Plan Note (Signed)
I emailed a prescription for Polytrim eyedrops to use on the left eye.  [name] will call her ophthalmologist if not better in a day or two or if worse.

## 2019-02-18 ENCOUNTER — Other Ambulatory Visit: Payer: Self-pay | Admitting: Internal Medicine

## 2019-02-21 ENCOUNTER — Other Ambulatory Visit: Payer: Self-pay

## 2019-02-21 ENCOUNTER — Encounter: Payer: Self-pay | Admitting: Internal Medicine

## 2019-02-21 ENCOUNTER — Ambulatory Visit (INDEPENDENT_AMBULATORY_CARE_PROVIDER_SITE_OTHER): Payer: 59 | Admitting: Internal Medicine

## 2019-02-21 DIAGNOSIS — E538 Deficiency of other specified B group vitamins: Secondary | ICD-10-CM | POA: Diagnosis not present

## 2019-02-21 DIAGNOSIS — F4321 Adjustment disorder with depressed mood: Secondary | ICD-10-CM

## 2019-02-21 DIAGNOSIS — E279 Disorder of adrenal gland, unspecified: Secondary | ICD-10-CM

## 2019-02-21 DIAGNOSIS — E278 Other specified disorders of adrenal gland: Secondary | ICD-10-CM

## 2019-02-21 DIAGNOSIS — R634 Abnormal weight loss: Secondary | ICD-10-CM

## 2019-02-21 DIAGNOSIS — R5382 Chronic fatigue, unspecified: Secondary | ICD-10-CM

## 2019-02-21 MED ORDER — ALPRAZOLAM 0.5 MG PO TABS: 0.5000 mg | ORAL_TABLET | Freq: Every day | ORAL | 0 refills | Status: DC

## 2019-02-21 NOTE — Patient Instructions (Signed)
These suggestions will probably help you to improve your metabolism if you are not overweight and to lose weight if you are overweight: 1.  Reduce your consumption of sugars and starches.  Eliminate high fructose corn syrup from your diet.  Reduce your consumption of processed foods.  For desserts try to have seasonal fruits, berries with with green, nuts, cheeses or dark chocolate with more than 70% cacao. 2.  Do not snack 3.  You do not have to eat breakfast.  If you choose to have breakfast-eat plain greek yogurt, eggs, oatmeal (without sugar) 4.  Drink water, freshly brewed unsweetened tea (green, black or herbal) or coffee.  Do not drink sodas including diet sodas , juices, beverages sweetened with artificial sweeteners. 5.  Reduce your consumption of refined grains. 6.  Avoid protein drinks such as Optifast, Slim fast etc. Eat chicken, fish, meat, dairy and beans for your sources of protein 7.  Natural unprocessed fats like cold pressed virgin olive oil, butter, coconut oil are good for you.  Eat avocados 8.  Increase your consumption of fiber.  Fruits, berries, vegetables, whole grains, flaxseeds, Chia seeds, beans, popcorn, nuts, oatmeal are good sources of fiber 9.  Use vinegar in your diet, i.e. apple cider vinegar, red wine or balsamic vinegar 10.  You can try fasting.  For example you can skip breakfast and lunch every other day (24-hour fast) 11.  Stress reduction, good night sleep, relaxation, meditation, yoga and other physical activity is likely to help you to maintain low weight too. 12.  If you drink alcohol, limit your alcohol intake to no more than 2 drinks a day.   Mediterranean diet is good for you.  The Mediterranean diet is a way of eating based on the traditional cuisine of countries bordering the [place] Sea. While there is no single definition of the Mediterranean diet, it is typically high in vegetables, fruits, whole grains, beans, nut and seeds, and olive  oil. The main components of Mediterranean diet include: . Daily consumption of vegetables, fruits, whole grains and healthy fats  . Weekly intake of fish, poultry, beans and eggs  . Moderate portions of dairy products  . Limited intake of red meat Other important elements of the Mediterranean diet are sharing meals with family and friends, enjoying a glass of red wine and being physically active. Health benefits of a Mediterranean diet: A traditional Mediterranean diet consisting of large quantities of fresh fruits and vegetables, nuts, fish and olive oil-coupled with physical activity-can reduce your risk of serious mental and physical health problems by: Preventing heart disease and strokes. Following a Mediterranean diet limits your intake of refined breads, processed foods, and red meat, and encourages drinking red wine instead of hard liquor-all factors that can help prevent heart disease and stroke. Keeping you agile. If you're an older adult, the nutrients gained with a Mediterranean diet may reduce your risk of developing muscle weakness and other signs of frailty by about 70 percent. Reducing the risk of Alzheimer's. Research suggests that the Mediterranean diet may improve cholesterol, blood sugar levels, and overall blood vessel health, which in turn may reduce your risk of Alzheimer's disease or dementia. Halving the risk of Parkinson's disease. The high levels of antioxidants in the Mediterranean diet can prevent cells from undergoing a damaging process called oxidative stress, thereby cutting the risk of Parkinson's disease in half. Increasing longevity. By reducing your risk of developing heart disease or cancer with the Mediterranean diet, you're reducing your risk   of death at any age by 20%. Protecting against type 2 diabetes. A Mediterranean diet is rich in fiber which digests slowly, prevents huge swings in blood sugar, and can help you maintain a healthy weight.    Cabbage soup  recipe that will not make you gain weight: Take 1 small head of CABG, 1 average pack of celery, 4 green peppers, 4 onions, 2 cans diced tomatoes (they are not available without salt), salt and spices to taste.  Chop cabbage, celery, peppers and onions.  And tomatoes and 2-2.5 liters (2.5 quarts) of water so that it would just cover the vegetables.  Bring to boil.  Add spices and salt.  Turn heat to low/medium and simmer for 20-25 minutes.  Naturally, you can make a smaller batch and change some of the ingredients.  

## 2019-02-21 NOTE — Assessment & Plan Note (Signed)
Repeat abd CT non-contrast

## 2019-02-21 NOTE — Assessment & Plan Note (Signed)
On B12 

## 2019-02-21 NOTE — Assessment & Plan Note (Signed)
CFS, FMS discussed

## 2019-02-21 NOTE — Telephone Encounter (Signed)
[place] Controlled Database Checked Last filled: 11/23/18 # 180 LOV w/you: 01/03/19 Next appt w/you: 02/21/19

## 2019-02-21 NOTE — Progress Notes (Signed)
Subjective:  Patient ID: [name], female    DOB: [birth year]  Age: 48 y.o. MRN: [id number]  CC: No chief complaint on file.   HPI [name] presents for stress and anxiety - split up w/fiancee and the engagement was called off C/o depression, lost weight x 2 months Working too much, short staffed   Outpatient Medications Prior to Visit  Medication Sig Dispense Refill  . ALPRAZolam (XANAX) 0.5 MG tablet Take 1-2 tablets (0.5-1 mg total) by mouth at bedtime. 180 tablet 0  . Armodafinil (NUVIGIL) 150 MG tablet Take 1 tablet (150 mg total) by mouth daily. HC:WCBJS work 30 tablet 5  . cetirizine (ZYRTEC) 10 MG tablet Take 10 mg by mouth daily.    . Cholecalciferol (EQL VITAMIN D3) 1000 UNITS tablet Take 1,000 Units by mouth daily.      . cyclobenzaprine (FLEXERIL) 10 MG tablet Take 1-2 tablets (10-20 mg total) by mouth at bedtime. Take 1-2 qhs 180 tablet 3  . cyclobenzaprine (FLEXERIL) 5 MG tablet Take 1 tablet (5 mg total) by mouth 3 (three) times daily as needed for muscle spasms. 60 tablet 2  . fluticasone (FLONASE) 50 MCG/ACT nasal spray SPRAY 2 SPRAYS INTO EACH NOSTRIL EVERY DAY 48 g 11  . metFORMIN (GLUCOPHAGE XR) 500 MG 24 hr tablet Take 1 tablet (500 mg total) by mouth daily with breakfast. 90 tablet 3  . naproxen (NAPROSYN) 500 MG tablet Take 1 tablet (500 mg total) by mouth 2 (two) times daily as needed for moderate pain or headache. Use prn 60 tablet 3  . Olmesartan-amLODIPine-HCTZ 40-10-25 MG TABS TAKE 1 TABLET BY MOUTH EVERY DAY 90 tablet 3  . ondansetron (ZOFRAN) 4 MG tablet Take 1 tablet (4 mg total) by mouth every 8 (eight) hours as needed for nausea or vomiting. 20 tablet 1  . oxymetazoline (AFRIN NASAL SPRAY) 0.05 % nasal spray Place 1 spray into both nostrils 2 (two) times daily. Use only for 3days, then stop 30 mL 0  . Prenatal Vit-Fe Fumarate-FA (PRENATAL VITAMIN PLUS LOW IRON) 27-1 MG TABS TAKE 1 TABLET BY MOUTH EVERY DAY 90 tablet 3  . rizatriptan (MAXALT)  10 MG tablet Take 1 tablet (10 mg total) by mouth once as needed for up to 1 dose for migraine. May repeat in 2 hours if needed 12 tablet 5   No facility-administered medications prior to visit.     ROS: Review of Systems  Constitutional: Negative for activity change, appetite change, chills, fatigue and unexpected weight change.  HENT: Negative for congestion, mouth sores and sinus pressure.   Eyes: Negative for visual disturbance.  Respiratory: Negative for cough and chest tightness.   Gastrointestinal: Negative for abdominal pain and nausea.  Genitourinary: Negative for difficulty urinating, frequency and vaginal pain.  Musculoskeletal: Negative for back pain and gait problem.  Skin: Negative for pallor and rash.  Neurological: Negative for dizziness, tremors, weakness, numbness and headaches.  Psychiatric/Behavioral: Positive for decreased concentration, dysphoric mood and sleep disturbance. Negative for confusion and suicidal ideas. The patient is nervous/anxious.     Objective:  BP 120/80 (BP Location: Left Arm, Patient Position: Sitting, Cuff Size: Large)   Pulse 78   Temp 98.7 F (37.1 C) (Oral)   Ht 5\' 3"  (1.6 m)   Wt 198 lb (89.8 kg)   SpO2 97%   BMI 35.07 kg/m   BP Readings from Last 3 Encounters:  02/21/19 120/80  06/03/18 116/72  03/30/18 118/74    Wt Readings from Last 3  Encounters:  02/21/19 198 lb (89.8 kg)  06/03/18 223 lb (101.2 kg)  03/30/18 222 lb (100.7 kg)    Physical Exam Constitutional:      General: She is not in acute distress.    Appearance: She is well-developed.  HENT:     Head: Normocephalic.     Right Ear: External ear normal.     Left Ear: External ear normal.     Nose: Nose normal.  Eyes:     General:        Right eye: No discharge.        Left eye: No discharge.     Conjunctiva/sclera: Conjunctivae normal.     Pupils: Pupils are equal, round, and reactive to light.  Neck:     Musculoskeletal: Normal range of motion and neck  supple.     Thyroid: No thyromegaly.     Vascular: No JVD.     Trachea: No tracheal deviation.  Cardiovascular:     Rate and Rhythm: Normal rate and regular rhythm.     Heart sounds: Normal heart sounds.  Pulmonary:     Effort: No respiratory distress.     Breath sounds: No stridor. No wheezing.  Abdominal:     General: Bowel sounds are normal. There is no distension.     Palpations: Abdomen is soft. There is no mass.     Tenderness: There is no abdominal tenderness. There is no guarding or rebound.  Musculoskeletal:        General: No tenderness.  Lymphadenopathy:     Cervical: No cervical adenopathy.  Skin:    Findings: No erythema or rash.  Neurological:     Mental Status: She is oriented to person, place, and time.     Cranial Nerves: No cranial nerve deficit.     Motor: No abnormal muscle tone.     Coordination: Coordination normal.     Deep Tendon Reflexes: Reflexes normal.  Psychiatric:        Behavior: Behavior normal.        Thought Content: Thought content normal.        Judgment: Judgment normal.   Tearful, upset  Lab Results  Component Value Date   WBC 5.9 11/25/2018   HGB 13.0 11/25/2018   HCT 38.8 11/25/2018   PLT 139.0 (L) 11/25/2018   GLUCOSE 102 (H) 11/25/2018   CHOL 180 09/30/2017   TRIG 67.0 09/30/2017   HDL 56.30 09/30/2017   LDLCALC 111 (H) 09/30/2017   ALT 12 11/25/2018   AST 16 11/25/2018   NA 138 11/25/2018   K 3.4 (L) 11/25/2018   CL 102 11/25/2018   CREATININE 0.63 11/25/2018   BUN 15 11/25/2018   CO2 28 11/25/2018   TSH 0.69 11/25/2018   INR 0.9 RATIO 03/15/2007   HGBA1C 6.0 11/25/2018    Dg Chest 2 View  Result Date: 08/24/2017 CLINICAL DATA:  Chest pain. EXAM: CHEST  2 VIEW COMPARISON:  None. FINDINGS: Normal cardiac silhouette and mediastinal contours. There is mild thickening of the pulmonary interstitium, most conspicuous about the bilateral pulmonary hila. No discrete focal airspace opacities. No pleural effusion or  pneumothorax. No evidence of edema. No acute osseus abnormalities. IMPRESSION: Findings suggestive of airways disease / bronchitis. No focal airspace opacities to suggest pneumonia. Electronically Signed   By: Sandi Mariscal M.D.   On: 08/24/2017 09:23    Assessment & Plan:   There are no diagnoses linked to this encounter.   No orders of the defined types  were placed in this encounter.    Follow-up: No follow-ups on file.  Walker Kehr, MD

## 2019-02-21 NOTE — Assessment & Plan Note (Addendum)
Split up w/fiancee and the engagement was called off C/o depression, lost weight x 2 months Working too much, short staffed FMLA  Pt declined an antidepressant Psychol ref when the pt calls we w/a name

## 2019-02-24 DIAGNOSIS — Z0279 Encounter for issue of other medical certificate: Secondary | ICD-10-CM

## 2019-03-02 ENCOUNTER — Telehealth: Payer: Self-pay | Admitting: *Deleted

## 2019-03-02 NOTE — Telephone Encounter (Signed)
Copied from [place] [id number]. Topic: General - Other >> Mar 02, 2019  8:33 AM [name] A wrote: Reason for CRM: Pt called stating the [facility] should be sending in papers for her and she is needing to speak with someone about what the nurse advised her to do. Please advise.

## 2019-03-02 NOTE — Telephone Encounter (Signed)
Patient called to check the status of the paperwork from [place] and [place].  Please advise and call patient at [phone]

## 2019-03-04 NOTE — Telephone Encounter (Signed)
Pt requesting callback from Erline Levine today so she can give information on form from [place]. Pt aware Dr. Alain Marion is OOO.

## 2019-03-07 NOTE — Telephone Encounter (Signed)
I called pt- she states her depression is worse. She is exhausted. She went back to work and had crying spells. The nurse at her job suggested that she stay out until her 03/15/19 CT scan.  The patient read or was told that some of her sxs could be coming from an adrenal gland problem. She wants PCP's thoughts on this. Her last day at work was 02/23/19.   [phone] ext [phone] Alyson Ingles) disability

## 2019-03-08 ENCOUNTER — Other Ambulatory Visit: Payer: Self-pay | Admitting: *Deleted

## 2019-03-08 DIAGNOSIS — E278 Other specified disorders of adrenal gland: Secondary | ICD-10-CM

## 2019-03-08 NOTE — Telephone Encounter (Signed)
Informed patient I did receive the form.   Form has been completed & placed with provider to sign.  She is requesting to return to work on 03/21/19

## 2019-03-08 NOTE — Telephone Encounter (Signed)
done

## 2019-03-08 NOTE — Telephone Encounter (Signed)
Disability forms received. Ok per PCP to write patient OOW until after 03/15/19 CT scan.

## 2019-03-08 NOTE — Telephone Encounter (Signed)
Left message for Alyson Ingles to fax forms to [place] at [phone] or call back with a update as we have not received forms for patient.

## 2019-03-09 NOTE — Telephone Encounter (Signed)
Pt called in requesting a call back from [place].

## 2019-03-09 NOTE — Telephone Encounter (Signed)
I called pt- she needs genex from completed. She states MD needs to note that patient was emotionally unstable and crying during her 02/21/19 OV. Form is in MDs folder for completion.

## 2019-03-10 NOTE — Telephone Encounter (Addendum)
Pt called back in to speak with [place] to follow up on form. Pt says that she would like to have this completed as soon as possible so that she get paid. Pt would like a call back at the phone on file.

## 2019-03-11 ENCOUNTER — Other Ambulatory Visit (INDEPENDENT_AMBULATORY_CARE_PROVIDER_SITE_OTHER): Payer: 59

## 2019-03-11 ENCOUNTER — Other Ambulatory Visit: Payer: Self-pay | Admitting: Internal Medicine

## 2019-03-11 DIAGNOSIS — E278 Other specified disorders of adrenal gland: Secondary | ICD-10-CM

## 2019-03-11 DIAGNOSIS — E279 Disorder of adrenal gland, unspecified: Secondary | ICD-10-CM

## 2019-03-11 LAB — BASIC METABOLIC PANEL
BUN: 9 mg/dL (ref 6–23)
CO2: 30 mEq/L (ref 19–32)
Calcium: 9.7 mg/dL (ref 8.4–10.5)
Chloride: 101 mEq/L (ref 96–112)
Creatinine, Ser: 0.67 mg/dL (ref 0.40–1.20)
GFR: 113.16 mL/min (ref >60.00)
Glucose, Bld: 91 mg/dL (ref 70–99)
Potassium: 3.2 mEq/L — ABNORMAL LOW (ref 3.5–5.1)
Sodium: 140 mEq/L (ref 135–145)

## 2019-03-11 MED ORDER — AMLODIPINE-OLMESARTAN 10-40 MG PO TABS: 1.0000 | ORAL_TABLET | Freq: Every day | ORAL | 3 refills | Status: DC

## 2019-03-11 MED ORDER — POTASSIUM CHLORIDE CRYS ER 20 MEQ PO TBCR: 20.0000 meq | EXTENDED_RELEASE_TABLET | Freq: Every day | ORAL | 1 refills | Status: DC

## 2019-03-11 NOTE — Telephone Encounter (Signed)
It is done.  Thanks 

## 2019-03-15 ENCOUNTER — Other Ambulatory Visit: Payer: Self-pay | Admitting: Internal Medicine

## 2019-03-15 ENCOUNTER — Ambulatory Visit (INDEPENDENT_AMBULATORY_CARE_PROVIDER_SITE_OTHER)
Admission: RE | Admit: 2019-03-15 | Discharge: 2019-03-15 | Disposition: A | Discharge status: Home / Self Care | Payer: 59 | Source: Ambulatory Visit | Attending: Internal Medicine | Admitting: Internal Medicine

## 2019-03-15 ENCOUNTER — Other Ambulatory Visit: Payer: Self-pay

## 2019-03-15 DIAGNOSIS — E279 Disorder of adrenal gland, unspecified: Secondary | ICD-10-CM | POA: Diagnosis not present

## 2019-03-15 DIAGNOSIS — R634 Abnormal weight loss: Secondary | ICD-10-CM

## 2019-03-15 DIAGNOSIS — E278 Other specified disorders of adrenal gland: Secondary | ICD-10-CM

## 2019-03-22 ENCOUNTER — Ambulatory Visit (INDEPENDENT_AMBULATORY_CARE_PROVIDER_SITE_OTHER): Payer: 59 | Admitting: Internal Medicine

## 2019-03-22 ENCOUNTER — Other Ambulatory Visit: Payer: Self-pay

## 2019-03-22 ENCOUNTER — Encounter: Payer: Self-pay | Admitting: Internal Medicine

## 2019-03-22 DIAGNOSIS — E538 Deficiency of other specified B group vitamins: Secondary | ICD-10-CM

## 2019-03-22 DIAGNOSIS — I10 Essential (primary) hypertension: Secondary | ICD-10-CM

## 2019-03-22 DIAGNOSIS — F4321 Adjustment disorder with depressed mood: Secondary | ICD-10-CM

## 2019-03-22 DIAGNOSIS — R5382 Chronic fatigue, unspecified: Secondary | ICD-10-CM | POA: Diagnosis not present

## 2019-03-22 NOTE — Patient Instructions (Signed)

## 2019-03-22 NOTE — Assessment & Plan Note (Signed)
On B12 

## 2019-03-22 NOTE — Progress Notes (Signed)
Subjective:  Patient ID: [name], female    DOB: 1970/11/17  Age: 48 y.o. MRN: [id number]  CC: No chief complaint on file.   HPI [name] presents for depression, stress  - better w/occ setbacks (crying spells). F/u HTN, wt loss, low K.    Outpatient Medications Prior to Visit  Medication Sig Dispense Refill  . ALPRAZolam (XANAX) 0.5 MG tablet TAKE 1 TO 2 TABLETS BY MOUTH AT BEDTIME 180 tablet 0  . amLODipine-olmesartan (AZOR) 10-40 MG tablet Take 1 tablet by mouth daily. 90 tablet 3  . Armodafinil (NUVIGIL) 150 MG tablet Take 1 tablet (150 mg total) by mouth daily. ZD:GUYQI work 30 tablet 5  . cetirizine (ZYRTEC) 10 MG tablet Take 10 mg by mouth daily.    . Cholecalciferol (EQL VITAMIN D3) 1000 UNITS tablet Take 1,000 Units by mouth daily.      . cyclobenzaprine (FLEXERIL) 10 MG tablet Take 1-2 tablets (10-20 mg total) by mouth at bedtime. Take 1-2 qhs 180 tablet 3  . cyclobenzaprine (FLEXERIL) 5 MG tablet Take 1 tablet (5 mg total) by mouth 3 (three) times daily as needed for muscle spasms. 60 tablet 2  . fluticasone (FLONASE) 50 MCG/ACT nasal spray SPRAY 2 SPRAYS INTO EACH NOSTRIL EVERY DAY 48 g 11  . FYAVOLV 1-5 MG-MCG TABS tablet Take 1 tablet by mouth daily.    . meloxicam (MOBIC) 15 MG tablet daily as needed.    . metFORMIN (GLUCOPHAGE XR) 500 MG 24 hr tablet Take 1 tablet (500 mg total) by mouth daily with breakfast. 90 tablet 3  . naproxen (NAPROSYN) 500 MG tablet Take 1 tablet (500 mg total) by mouth 2 (two) times daily as needed for moderate pain or headache. Use prn 60 tablet 3  . ondansetron (ZOFRAN) 4 MG tablet Take 1 tablet (4 mg total) by mouth every 8 (eight) hours as needed for nausea or vomiting. 20 tablet 1  . oxymetazoline (AFRIN NASAL SPRAY) 0.05 % nasal spray Place 1 spray into both nostrils 2 (two) times daily. Use only for 3days, then stop 30 mL 0  . potassium chloride SA (K-DUR) 20 MEQ tablet Take 1 tablet (20 mEq total) by mouth daily. 30 tablet  1  . Prenatal Vit-Fe Fumarate-FA (PRENATAL VITAMIN PLUS LOW IRON) 27-1 MG TABS TAKE 1 TABLET BY MOUTH EVERY DAY 90 tablet 3  . rizatriptan (MAXALT) 10 MG tablet Take 1 tablet (10 mg total) by mouth once as needed for up to 1 dose for migraine. May repeat in 2 hours if needed 12 tablet 5   No facility-administered medications prior to visit.     ROS: Review of Systems  Constitutional: Negative for activity change, appetite change, chills, fatigue and unexpected weight change.  HENT: Negative for congestion, mouth sores and sinus pressure.   Eyes: Negative for visual disturbance.  Respiratory: Negative for cough and chest tightness.   Gastrointestinal: Negative for abdominal pain and nausea.  Genitourinary: Negative for difficulty urinating, frequency and vaginal pain.  Musculoskeletal: Negative for back pain and gait problem.  Skin: Negative for pallor and rash.  Neurological: Negative for dizziness, tremors, weakness, numbness and headaches.  Psychiatric/Behavioral: Positive for decreased concentration, dysphoric mood and sleep disturbance. Negative for agitation, behavioral problems, confusion and suicidal ideas. The patient is nervous/anxious.     Objective:  BP 110/70 (BP Location: Left Arm, Patient Position: Sitting, Cuff Size: Large)   Pulse 88   Temp 98.4 F (36.9 C) (Oral)   Ht 5\' 3"  (1.6 m)  Wt 208 lb (94.3 kg)   LMP 02/23/2019   SpO2 99%   BMI 36.85 kg/m   BP Readings from Last 3 Encounters:  03/22/19 110/70  02/21/19 120/80  06/03/18 116/72    Wt Readings from Last 3 Encounters:  03/22/19 208 lb (94.3 kg)  02/21/19 198 lb (89.8 kg)  06/03/18 223 lb (101.2 kg)    Physical Exam Constitutional:      General: She is not in acute distress.    Appearance: She is well-developed.  HENT:     Head: Normocephalic.     Right Ear: External ear normal.     Left Ear: External ear normal.     Nose: Nose normal.  Eyes:     General:        Right eye: No discharge.         Left eye: No discharge.     Conjunctiva/sclera: Conjunctivae normal.     Pupils: Pupils are equal, round, and reactive to light.  Neck:     Musculoskeletal: Normal range of motion and neck supple.     Thyroid: No thyromegaly.     Vascular: No JVD.     Trachea: No tracheal deviation.  Cardiovascular:     Rate and Rhythm: Normal rate and regular rhythm.     Heart sounds: Normal heart sounds.  Pulmonary:     Effort: No respiratory distress.     Breath sounds: No stridor. No wheezing.  Abdominal:     General: Bowel sounds are normal. There is no distension.     Palpations: Abdomen is soft. There is no mass.     Tenderness: There is no abdominal tenderness. There is no guarding or rebound.  Musculoskeletal:        General: No tenderness.  Lymphadenopathy:     Cervical: No cervical adenopathy.  Skin:    Findings: No erythema or rash.  Neurological:     Mental Status: She is oriented to person, place, and time.     Cranial Nerves: No cranial nerve deficit.     Motor: No abnormal muscle tone.     Coordination: Coordination normal.     Deep Tendon Reflexes: Reflexes normal.  Psychiatric:        Behavior: Behavior normal.        Thought Content: Thought content normal.        Judgment: Judgment normal.   sad  Lab Results  Component Value Date   WBC 5.9 11/25/2018   HGB 13.0 11/25/2018   HCT 38.8 11/25/2018   PLT 139.0 (L) 11/25/2018   GLUCOSE 91 03/11/2019   CHOL 180 09/30/2017   TRIG 67.0 09/30/2017   HDL 56.30 09/30/2017   LDLCALC 111 (H) 09/30/2017   ALT 12 11/25/2018   AST 16 11/25/2018   NA 140 03/11/2019   K 3.2 (L) 03/11/2019   CL 101 03/11/2019   CREATININE 0.67 03/11/2019   BUN 9 03/11/2019   CO2 30 03/11/2019   TSH 0.69 11/25/2018   INR 0.9 RATIO 03/15/2007   HGBA1C 6.0 11/25/2018    Ct Abdomen Wo Contrast  Result Date: 03/15/2019 CLINICAL DATA:  Right adrenal nodule. EXAM: CT ABDOMEN WITHOUT CONTRAST TECHNIQUE: Multidetector CT imaging of the  abdomen was performed following the standard protocol without IV contrast. COMPARISON:  10/22/2012 FINDINGS: Lower chest: Adjacent 2-3 mm pulmonary nodules in the left lower lobe appear new since prior study (image 5/series 3) Hepatobiliary: The liver shows diffusely decreased attenuation suggesting fat deposition. There is no evidence  for gallstones, gallbladder wall thickening, or pericholecystic fluid. No intrahepatic or extrahepatic biliary dilation. Pancreas: No focal mass lesion. No dilatation of the main duct. No intraparenchymal cyst. No peripancreatic edema. Spleen: No splenomegaly. No focal mass lesion. Adrenals/Urinary Tract: Left adrenal gland unremarkable. Small right adrenal nodules are similar to prior. The more posterior of the 2 nodules measures 15 mm today which is unchanged when I remeasure on the prior study. The more anterior of the 2 nodules measures 2.2 cm which is also stable when I remeasure it on the prior exam. This nodule has an average attenuation of 7.9 Hounsfield units, consistent with adenoma. Kidneys unremarkable. Stomach/Bowel: Tiny hiatal hernia. Stomach is unremarkable. No gastric wall thickening. No evidence of outlet obstruction. Duodenum is normally positioned as is the ligament of Treitz. Visualized small bowel loops and colonic segments of the abdomen are unremarkable. Vascular/Lymphatic: No abdominal aortic aneurysm. Other: No intraperitoneal free fluid. Musculoskeletal: Small umbilical hernia contains a short segment of small bowel without complicating features. No worrisome lytic or sclerotic osseous abnormality. IMPRESSION: 1. 2 right adrenal nodules are stable in the 6 year interval since prior study consistent with benign etiology. The more anterior of these 2 nodules has low attenuation consistent with adenoma. 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 2. Small umbilical hernia contains a short segment of  small bowel without complicating features. 3. 2 adjacent 2-3 mm pulmonary nodules noted in the left lower lobe. No follow-up needed if patient is low-risk (and has no known or suspected primary neoplasm). Non-contrast chest CT can be considered in 12 months if patient is high-risk. This recommendation follows the consensus statement: Guidelines for Management of Incidental Pulmonary Nodules Detected on CT Images: From the Fleischner Society 2017; Radiology 2017; 284:228-243. Electronically Signed   By: Misty Stanley M.D.   On: 03/15/2019 15:31    Assessment & Plan:   There are no diagnoses linked to this encounter.   No orders of the defined types were placed in this encounter.    Follow-up: No follow-ups on file.  Walker Kehr, MD

## 2019-03-22 NOTE — Assessment & Plan Note (Signed)
The pt continues  to have depression - better OK to RTW on Mon next week

## 2019-03-22 NOTE — Assessment & Plan Note (Signed)
[place] 

## 2019-03-22 NOTE — Assessment & Plan Note (Signed)
Obesity - gaining wt again  Diet info

## 2019-03-29 ENCOUNTER — Telehealth: Payer: Self-pay | Admitting: Internal Medicine

## 2019-03-29 NOTE — Telephone Encounter (Signed)
pls call pt back she says this is not for the FMLA paper this is for 8/18 [employer].. The are disability papers. [phone] Call as as soon possible

## 2019-03-29 NOTE — Telephone Encounter (Signed)
LVM for patient to call back.   What questions does she have about FMLA she has some FMLA just completed last month.  Is this FMLA for the return to work date on 03/28/19?

## 2019-03-29 NOTE — Telephone Encounter (Signed)
Pt called in to speak with Stacy. Pt says that she have some FMLA paperwork being faxed that she really need completed by Friday.   Pt would like a call back [phone]

## 2019-03-30 NOTE — Telephone Encounter (Signed)
These are STD forms from [employer]. Spoke with patient, She is contacting them and requesting for them to be faxed again - Fax number given [phone].   Leave start 8/17, return to work on 8/24.

## 2019-03-31 NOTE — Telephone Encounter (Signed)
Spoke with patient. Informed her I have spoke with Romie Minus.

## 2019-03-31 NOTE — Telephone Encounter (Signed)
Patient called stating her paperwork has not been sent back.  Patient would like [place] to call her caseworker, [phone] [phone] Patient call back [phone]

## 2019-03-31 NOTE — Telephone Encounter (Signed)
OV notes and return to work letter have been faxed to [place] at [employer].  Patient has been informed.

## 2019-04-12 ENCOUNTER — Other Ambulatory Visit: Payer: Self-pay | Admitting: Internal Medicine

## 2019-04-18 ENCOUNTER — Other Ambulatory Visit: Payer: Self-pay

## 2019-04-18 ENCOUNTER — Ambulatory Visit (INDEPENDENT_AMBULATORY_CARE_PROVIDER_SITE_OTHER): Payer: 59

## 2019-04-18 DIAGNOSIS — Z23 Encounter for immunization: Secondary | ICD-10-CM | POA: Diagnosis not present

## 2019-05-02 ENCOUNTER — Telehealth: Payer: Self-pay | Admitting: Internal Medicine

## 2019-05-02 NOTE — Telephone Encounter (Signed)
Pt reports after completing potassium and BP Med cylce, Left ankle is swelling  Best contact: [phone]

## 2019-05-04 NOTE — Telephone Encounter (Signed)
Please advise 

## 2019-05-06 NOTE — Telephone Encounter (Signed)
Any medication change or do you want OV?

## 2019-05-06 NOTE — Telephone Encounter (Signed)
What is the question?  Thanks

## 2019-05-07 NOTE — Telephone Encounter (Signed)
No change in meds.  Take potassium.  Try Aleve as directed for the left ankle swelling.  Thanks

## 2019-05-10 NOTE — Telephone Encounter (Signed)
Pt states she has the swelling in the left ankle since switching BP medications with no fluid pill. She would like to know if she can go back on her old BP medication

## 2019-05-11 MED ORDER — OLMESARTAN-AMLODIPINE-HCTZ 40-5-12.5 MG PO TABS: 1.0000 | ORAL_TABLET | ORAL | 3 refills | Status: DC

## 2019-05-11 MED ORDER — POTASSIUM CHLORIDE CRYS ER 20 MEQ PO TBCR: 20.0000 meq | EXTENDED_RELEASE_TABLET | Freq: Every day | ORAL | 3 refills | Status: DC

## 2019-05-11 NOTE — Telephone Encounter (Signed)
Ok to go back on Biomedical scientist and continue KCl daily (Rx) Thx

## 2019-05-12 NOTE — Telephone Encounter (Signed)
LM notifying pt

## 2019-05-15 ENCOUNTER — Other Ambulatory Visit: Payer: Self-pay | Admitting: Internal Medicine

## 2019-06-08 ENCOUNTER — Other Ambulatory Visit: Payer: Self-pay | Admitting: Internal Medicine

## 2019-06-15 ENCOUNTER — Ambulatory Visit: Payer: 59 | Admitting: Internal Medicine

## 2019-06-21 ENCOUNTER — Other Ambulatory Visit (INDEPENDENT_AMBULATORY_CARE_PROVIDER_SITE_OTHER): Payer: 59

## 2019-06-21 ENCOUNTER — Ambulatory Visit (INDEPENDENT_AMBULATORY_CARE_PROVIDER_SITE_OTHER): Payer: 59 | Admitting: Internal Medicine

## 2019-06-21 ENCOUNTER — Other Ambulatory Visit: Payer: Self-pay

## 2019-06-21 ENCOUNTER — Encounter: Payer: Self-pay | Admitting: Internal Medicine

## 2019-06-21 DIAGNOSIS — I10 Essential (primary) hypertension: Secondary | ICD-10-CM

## 2019-06-21 DIAGNOSIS — E538 Deficiency of other specified B group vitamins: Secondary | ICD-10-CM | POA: Diagnosis not present

## 2019-06-21 DIAGNOSIS — E876 Hypokalemia: Secondary | ICD-10-CM | POA: Diagnosis not present

## 2019-06-21 LAB — BASIC METABOLIC PANEL
BUN: 12 mg/dL (ref 6–23)
CO2: 31 mEq/L (ref 19–32)
Calcium: 9.6 mg/dL (ref 8.4–10.5)
Chloride: 101 mEq/L (ref 96–112)
Creatinine, Ser: 0.88 mg/dL (ref 0.40–1.20)
GFR: 82.51 mL/min (ref >60.00)
Glucose, Bld: 90 mg/dL (ref 70–99)
Potassium: 3.6 mEq/L (ref 3.5–5.1)
Sodium: 140 mEq/L (ref 135–145)

## 2019-06-21 MED ORDER — PRENATAL VITAMIN PLUS LOW IRON 27-1 MG PO TABS: 1.0000 | ORAL_TABLET | Freq: Every day | ORAL | 3 refills | Status: DC

## 2019-06-21 NOTE — Patient Instructions (Addendum)

## 2019-06-21 NOTE — Assessment & Plan Note (Signed)
On KCl BMET today

## 2019-06-21 NOTE — Progress Notes (Signed)
Subjective:  Patient ID: [name], female    DOB: [birth year]  Age: 48 y.o. MRN: [id number]  CC: No chief complaint on file.   HPI [name] presents for leg pain, leg swelling - went back on Tribenzor, swelling resolved and pain resolved F/u low K - taking KCl F/u depression  Outpatient Medications Prior to Visit  Medication Sig Dispense Refill  . ALPRAZolam (XANAX) 0.5 MG tablet TAKE 1 TO 2 TABLETS BY MOUTH AT BEDTIME 180 tablet 0  . Armodafinil (NUVIGIL) 150 MG tablet Take 1 tablet (150 mg total) by mouth daily. [id number] work 30 tablet 5  . cetirizine (ZYRTEC) 10 MG tablet Take 10 mg by mouth daily.    . Cholecalciferol (EQL VITAMIN D3) 1000 UNITS tablet Take 1,000 Units by mouth daily.      . cyclobenzaprine (FLEXERIL) 5 MG tablet TAKE 1 TABLET BY MOUTH THREE TIMES A DAY AS NEEDED FOR MUSCLE SPASMS 60 tablet 2  . fluticasone (FLONASE) 50 MCG/ACT nasal spray SPRAY 2 SPRAYS INTO EACH NOSTRIL EVERY DAY 48 g 11  . FYAVOLV 1-5 MG-MCG TABS tablet Take 1 tablet by mouth daily.    . meloxicam (MOBIC) 15 MG tablet daily as needed.    . metFORMIN (GLUCOPHAGE XR) 500 MG 24 hr tablet Take 1 tablet (500 mg total) by mouth daily with breakfast. 90 tablet 3  . naproxen (NAPROSYN) 500 MG tablet Take 1 tablet (500 mg total) by mouth 2 (two) times daily as needed for moderate pain or headache. Use prn 60 tablet 3  . Olmesartan-amLODIPine-HCTZ (TRIBENZOR) 40-5-12.5 MG TABS Take 1 tablet by mouth 1 day or 1 dose. 90 tablet 3  . ondansetron (ZOFRAN) 4 MG tablet Take 1 tablet (4 mg total) by mouth every 8 (eight) hours as needed for nausea or vomiting. 20 tablet 1  . oxymetazoline (AFRIN NASAL SPRAY) 0.05 % nasal spray Place 1 spray into both nostrils 2 (two) times daily. Use only for 3days, then stop 30 mL 0  . potassium chloride SA (KLOR-CON) 20 MEQ tablet Take 1 tablet (20 mEq total) by mouth daily. 90 tablet 3  . Prenatal Vit-Fe Fumarate-FA (PRENATAL VITAMIN PLUS LOW IRON) 27-1 MG TABS  TAKE 1 TABLET BY MOUTH EVERY DAY 90 tablet 3  . rizatriptan (MAXALT) 10 MG tablet Take 1 tablet (10 mg total) by mouth once as needed for up to 1 dose for migraine. May repeat in 2 hours if needed 12 tablet 5   No facility-administered medications prior to visit.     ROS: Review of Systems  Constitutional: Negative for activity change, appetite change, chills, fatigue and unexpected weight change.  HENT: Negative for congestion, mouth sores and sinus pressure.   Eyes: Negative for visual disturbance.  Respiratory: Negative for cough and chest tightness.   Cardiovascular: Positive for leg swelling.  Gastrointestinal: Negative for abdominal pain and nausea.  Genitourinary: Negative for difficulty urinating, frequency and vaginal pain.  Musculoskeletal: Negative for back pain and gait problem.  Skin: Negative for pallor and rash.  Neurological: Negative for dizziness, tremors, weakness, numbness and headaches.  Psychiatric/Behavioral: Negative for confusion and sleep disturbance.    Objective:  BP 112/74 (BP Location: Left Arm, Patient Position: Sitting, Cuff Size: Large)   Pulse 83   Temp 98 F (36.7 C) (Oral)   Ht 5\' 3"  (1.6 m)   Wt 219 lb (99.3 kg)   SpO2 99%   BMI 38.79 kg/m   BP Readings from Last 3 Encounters:  06/21/19 112/74  03/22/19 110/70  02/21/19 120/80    Wt Readings from Last 3 Encounters:  06/21/19 219 lb (99.3 kg)  03/22/19 208 lb (94.3 kg)  02/21/19 198 lb (89.8 kg)    Physical Exam Constitutional:      General: She is not in acute distress.    Appearance: She is well-developed.  HENT:     Head: Normocephalic.     Right Ear: External ear normal.     Left Ear: External ear normal.     Nose: Nose normal.  Eyes:     General:        Right eye: No discharge.        Left eye: No discharge.     Conjunctiva/sclera: Conjunctivae normal.     Pupils: Pupils are equal, round, and reactive to light.  Neck:     Musculoskeletal: Normal range of motion and  neck supple.     Thyroid: No thyromegaly.     Vascular: No JVD.     Trachea: No tracheal deviation.  Cardiovascular:     Rate and Rhythm: Normal rate and regular rhythm.     Heart sounds: Normal heart sounds.  Pulmonary:     Effort: No respiratory distress.     Breath sounds: No stridor. No wheezing.  Abdominal:     General: Bowel sounds are normal. There is no distension.     Palpations: Abdomen is soft. There is no mass.     Tenderness: There is no abdominal tenderness. There is no guarding or rebound.  Musculoskeletal:        General: No tenderness.  Lymphadenopathy:     Cervical: No cervical adenopathy.  Skin:    Findings: No erythema or rash.  Neurological:     Mental Status: She is oriented to person, place, and time.     Cranial Nerves: No cranial nerve deficit.     Motor: No abnormal muscle tone.     Coordination: Coordination normal.     Deep Tendon Reflexes: Reflexes normal.  Psychiatric:        Behavior: Behavior normal.        Thought Content: Thought content normal.        Judgment: Judgment normal.     Lab Results  Component Value Date   WBC 5.9 11/25/2018   HGB 13.0 11/25/2018   HCT 38.8 11/25/2018   PLT 139.0 (L) 11/25/2018   GLUCOSE 91 03/11/2019   CHOL 180 09/30/2017   TRIG 67.0 09/30/2017   HDL 56.30 09/30/2017   LDLCALC 111 (H) 09/30/2017   ALT 12 11/25/2018   AST 16 11/25/2018   NA 140 03/11/2019   K 3.2 (L) 03/11/2019   CL 101 03/11/2019   CREATININE 0.67 03/11/2019   BUN 9 03/11/2019   CO2 30 03/11/2019   TSH 0.69 11/25/2018   INR 0.9 RATIO 03/15/2007   HGBA1C 6.0 11/25/2018    Ct Abdomen Wo Contrast  Result Date: 03/15/2019 CLINICAL DATA:  Right adrenal nodule. EXAM: CT ABDOMEN WITHOUT CONTRAST TECHNIQUE: Multidetector CT imaging of the abdomen was performed following the standard protocol without IV contrast. COMPARISON:  10/22/2012 FINDINGS: Lower chest: Adjacent 2-3 mm pulmonary nodules in the left lower lobe appear new since prior  study (image 5/series 3) Hepatobiliary: The liver shows diffusely decreased attenuation suggesting fat deposition. There is no evidence for gallstones, gallbladder wall thickening, or pericholecystic fluid. No intrahepatic or extrahepatic biliary dilation. Pancreas: No focal mass lesion. No dilatation of the main duct. No intraparenchymal cyst. No peripancreatic  edema. Spleen: No splenomegaly. No focal mass lesion. Adrenals/Urinary Tract: Left adrenal gland unremarkable. Small right adrenal nodules are similar to prior. The more posterior of the 2 nodules measures 15 mm today which is unchanged when I remeasure on the prior study. The more anterior of the 2 nodules measures 2.2 cm which is also stable when I remeasure it on the prior exam. This nodule has an average attenuation of 7.9 Hounsfield units, consistent with adenoma. Kidneys unremarkable. Stomach/Bowel: Tiny hiatal hernia. Stomach is unremarkable. No gastric wall thickening. No evidence of outlet obstruction. Duodenum is normally positioned as is the ligament of Treitz. Visualized small bowel loops and colonic segments of the abdomen are unremarkable. Vascular/Lymphatic: No abdominal aortic aneurysm. Other: No intraperitoneal free fluid. Musculoskeletal: Small umbilical hernia contains a short segment of small bowel without complicating features. No worrisome lytic or sclerotic osseous abnormality. IMPRESSION: 1. 2 right adrenal nodules are stable in the 6 year interval since prior study consistent with benign etiology. The more anterior of these 2 nodules has low attenuation consistent with adenoma. 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 2. Small umbilical hernia contains a short segment of small bowel without complicating features. 3. 2 adjacent 2-3 mm pulmonary nodules noted in the left lower lobe. No follow-up needed if patient is low-risk (and has no known or suspected primary neoplasm).  Non-contrast chest CT can be considered in 12 months if patient is high-risk. This recommendation follows the consensus statement: Guidelines for Management of Incidental Pulmonary Nodules Detected on CT Images: From the Fleischner Society 2017; Radiology 2017; 284:228-243. Electronically Signed   By: Misty Stanley M.D.   On: 03/15/2019 15:31    Assessment & Plan:   There are no diagnoses linked to this encounter.   No orders of the defined types were placed in this encounter.    Follow-up: No follow-ups on file.  Walker Kehr, MD

## 2019-06-21 NOTE — Assessment & Plan Note (Signed)
C/o leg pain, leg swelling - went back on Tribenzor, swelling resolved and pain resolved

## 2019-06-21 NOTE — Assessment & Plan Note (Signed)
B 12

## 2019-07-20 ENCOUNTER — Other Ambulatory Visit: Payer: Self-pay | Admitting: Internal Medicine

## 2019-09-12 ENCOUNTER — Other Ambulatory Visit: Payer: Self-pay | Admitting: Internal Medicine

## 2019-10-25 ENCOUNTER — Other Ambulatory Visit: Payer: Self-pay

## 2019-10-25 ENCOUNTER — Ambulatory Visit: Payer: 59 | Admitting: Internal Medicine

## 2019-10-25 ENCOUNTER — Encounter: Payer: Self-pay | Admitting: Internal Medicine

## 2019-10-25 VITALS — BP 116/82 | HR 72 | Temp 98.1°F | Ht 63.0 in | Wt 228.0 lb

## 2019-10-25 DIAGNOSIS — J4520 Mild intermittent asthma, uncomplicated: Secondary | ICD-10-CM | POA: Diagnosis not present

## 2019-10-25 DIAGNOSIS — E559 Vitamin D deficiency, unspecified: Secondary | ICD-10-CM

## 2019-10-25 DIAGNOSIS — E538 Deficiency of other specified B group vitamins: Secondary | ICD-10-CM

## 2019-10-25 DIAGNOSIS — R0602 Shortness of breath: Secondary | ICD-10-CM | POA: Diagnosis not present

## 2019-10-25 DIAGNOSIS — I10 Essential (primary) hypertension: Secondary | ICD-10-CM

## 2019-10-25 DIAGNOSIS — G8929 Other chronic pain: Secondary | ICD-10-CM

## 2019-10-25 DIAGNOSIS — M545 Low back pain: Secondary | ICD-10-CM | POA: Diagnosis not present

## 2019-10-25 MED ORDER — MELOXICAM 15 MG PO TABS: 15.0000 mg | ORAL_TABLET | Freq: Every day | ORAL | 3 refills | Status: DC

## 2019-10-25 MED ORDER — DULERA 200-5 MCG/ACT IN AERO: 2.0000 | INHALATION_SPRAY | Freq: Two times a day (BID) | RESPIRATORY_TRACT | 5 refills | Status: DC

## 2019-10-25 NOTE — Assessment & Plan Note (Signed)
Use face shield Dulera prn

## 2019-10-25 NOTE — Assessment & Plan Note (Addendum)
Fasting, diet discussed Standing at work Gentle yoga FMS - flexeril, Xanax at night Naproxen - not helping Try Meloxicam

## 2019-10-25 NOTE — Progress Notes (Signed)
Subjective:  Patient ID: [name], female    DOB: Dec 26, 1970  Age: 49 y.o. MRN: [id number]  CC: No chief complaint on file.   HPI [name] presents for HTN C/o wheezing - worse when laying down, when the mask is on C/o LBP, wt gain  Outpatient Medications Prior to Visit  Medication Sig Dispense Refill  . ALPRAZolam (XANAX) 0.5 MG tablet TAKE 1 TO 2 TABLETS BY MOUTH EVERY DAY AT BEDTIME 180 tablet 0  . Armodafinil (NUVIGIL) 150 MG tablet Take 1 tablet (150 mg total) by mouth daily. [id number] work 30 tablet 5  . cetirizine (ZYRTEC) 10 MG tablet Take 10 mg by mouth daily.    . Cholecalciferol (EQL VITAMIN D3) 1000 UNITS tablet Take 1,000 Units by mouth daily.      . cyclobenzaprine (FLEXERIL) 5 MG tablet TAKE 1 TABLET BY MOUTH THREE TIMES A DAY AS NEEDED FOR MUSCLE SPASMS 60 tablet 2  . fluticasone (FLONASE) 50 MCG/ACT nasal spray SPRAY 2 SPRAYS INTO EACH NOSTRIL EVERY DAY 48 g 11  . FYAVOLV 1-5 MG-MCG TABS tablet Take 1 tablet by mouth daily.    . meloxicam (MOBIC) 15 MG tablet daily as needed.    . metFORMIN (GLUCOPHAGE XR) 500 MG 24 hr tablet Take 1 tablet (500 mg total) by mouth daily with breakfast. 90 tablet 3  . naproxen (NAPROSYN) 500 MG tablet Take 1 tablet (500 mg total) by mouth 2 (two) times daily as needed for moderate pain or headache. Use prn 60 tablet 3  . Olmesartan-amLODIPine-HCTZ (TRIBENZOR) 40-5-12.5 MG TABS Take 1 tablet by mouth 1 day or 1 dose. 90 tablet 3  . ondansetron (ZOFRAN) 4 MG tablet Take 1 tablet (4 mg total) by mouth every 8 (eight) hours as needed for nausea or vomiting. 20 tablet 1  . oxymetazoline (AFRIN NASAL SPRAY) 0.05 % nasal spray Place 1 spray into both nostrils 2 (two) times daily. Use only for 3days, then stop 30 mL 0  . potassium chloride SA (KLOR-CON) 20 MEQ tablet Take 1 tablet (20 mEq total) by mouth daily. 90 tablet 3  . Prenatal Vit-Fe Fumarate-FA (PRENATAL VITAMIN PLUS LOW IRON) 27-1 MG TABS TAKE 1 TABLET BY MOUTH EVERY DAY 90  tablet 3  . rizatriptan (MAXALT) 10 MG tablet Take 1 tablet (10 mg total) by mouth once as needed for up to 1 dose for migraine. May repeat in 2 hours if needed 12 tablet 5   No facility-administered medications prior to visit.    ROS: Review of Systems  Constitutional: Positive for unexpected weight change. Negative for activity change, appetite change, chills and fatigue.  HENT: Negative for congestion, mouth sores and sinus pressure.   Eyes: Negative for visual disturbance.  Respiratory: Negative for cough and chest tightness.   Gastrointestinal: Negative for abdominal pain and nausea.  Genitourinary: Negative for difficulty urinating, frequency and vaginal pain.  Musculoskeletal: Positive for back pain. Negative for gait problem.  Skin: Negative for pallor and rash.  Neurological: Negative for dizziness, tremors, weakness, numbness and headaches.  Psychiatric/Behavioral: Negative for confusion and sleep disturbance. The patient is nervous/anxious.     Objective:  BP 116/82 (BP Location: Left Arm, Patient Position: Sitting, Cuff Size: Large)   Pulse 72   Temp 98.1 F (36.7 C) (Oral)   Ht 5\' 3"  (1.6 m)   Wt 228 lb (103.4 kg)   SpO2 98%   BMI 40.39 kg/m   BP Readings from Last 3 Encounters:  10/25/19 116/82  06/21/19 112/74  03/22/19 110/70    Wt Readings from Last 3 Encounters:  10/25/19 228 lb (103.4 kg)  06/21/19 219 lb (99.3 kg)  03/22/19 208 lb (94.3 kg)    Physical Exam Constitutional:      General: She is not in acute distress.    Appearance: She is well-developed.  HENT:     Head: Normocephalic.     Right Ear: External ear normal.     Left Ear: External ear normal.     Nose: Nose normal.  Eyes:     General:        Right eye: No discharge.        Left eye: No discharge.     Conjunctiva/sclera: Conjunctivae normal.     Pupils: Pupils are equal, round, and reactive to light.  Neck:     Thyroid: No thyromegaly.     Vascular: No JVD.     Trachea: No  tracheal deviation.  Cardiovascular:     Rate and Rhythm: Normal rate and regular rhythm.     Heart sounds: Normal heart sounds.  Pulmonary:     Effort: No respiratory distress.     Breath sounds: No stridor. No wheezing.  Abdominal:     General: Bowel sounds are normal. There is no distension.     Palpations: Abdomen is soft. There is no mass.     Tenderness: There is no abdominal tenderness. There is no guarding or rebound.  Musculoskeletal:        General: No tenderness.     Cervical back: Normal range of motion and neck supple.  Lymphadenopathy:     Cervical: No cervical adenopathy.  Skin:    Findings: No erythema or rash.  Neurological:     Mental Status: She is oriented to person, place, and time.     Cranial Nerves: No cranial nerve deficit.     Motor: No abnormal muscle tone.     Coordination: Coordination normal.     Deep Tendon Reflexes: Reflexes normal.  Psychiatric:        Behavior: Behavior normal.        Thought Content: Thought content normal.        Judgment: Judgment normal.    I personally provided [facility] inhaler use teaching. After the teaching patient was able to demonstrate it's use effectively. All questions were answered  Lab Results  Component Value Date   WBC 5.9 11/25/2018   HGB 13.0 11/25/2018   HCT 38.8 11/25/2018   PLT 139.0 (L) 11/25/2018   GLUCOSE 90 06/21/2019   CHOL 180 09/30/2017   TRIG 67.0 09/30/2017   HDL 56.30 09/30/2017   LDLCALC 111 (H) 09/30/2017   ALT 12 11/25/2018   AST 16 11/25/2018   NA 140 06/21/2019   K 3.6 06/21/2019   CL 101 06/21/2019   CREATININE 0.88 06/21/2019   BUN 12 06/21/2019   CO2 31 06/21/2019   TSH 0.69 11/25/2018   INR 0.9 RATIO 03/15/2007   HGBA1C 6.0 11/25/2018    CT ABDOMEN WO CONTRAST  Result Date: 03/15/2019 CLINICAL DATA:  Right adrenal nodule. EXAM: CT ABDOMEN WITHOUT CONTRAST TECHNIQUE: Multidetector CT imaging of the abdomen was performed following the standard protocol without IV contrast.  COMPARISON:  10/22/2012 FINDINGS: Lower chest: Adjacent 2-3 mm pulmonary nodules in the left lower lobe appear new since prior study (image 5/series 3) Hepatobiliary: The liver shows diffusely decreased attenuation suggesting fat deposition. There is no evidence for gallstones, gallbladder wall thickening, or pericholecystic fluid. No intrahepatic or  extrahepatic biliary dilation. Pancreas: No focal mass lesion. No dilatation of the main duct. No intraparenchymal cyst. No peripancreatic edema. Spleen: No splenomegaly. No focal mass lesion. Adrenals/Urinary Tract: Left adrenal gland unremarkable. Small right adrenal nodules are similar to prior. The more posterior of the 2 nodules measures 15 mm today which is unchanged when I remeasure on the prior study. The more anterior of the 2 nodules measures 2.2 cm which is also stable when I remeasure it on the prior exam. This nodule has an average attenuation of 7.9 Hounsfield units, consistent with adenoma. Kidneys unremarkable. Stomach/Bowel: Tiny hiatal hernia. Stomach is unremarkable. No gastric wall thickening. No evidence of outlet obstruction. Duodenum is normally positioned as is the ligament of Treitz. Visualized small bowel loops and colonic segments of the abdomen are unremarkable. Vascular/Lymphatic: No abdominal aortic aneurysm. Other: No intraperitoneal free fluid. Musculoskeletal: Small umbilical hernia contains a short segment of small bowel without complicating features. No worrisome lytic or sclerotic osseous abnormality. IMPRESSION: 1. 2 right adrenal nodules are stable in the 6 year interval since prior study consistent with benign etiology. The more anterior of these 2 nodules has low attenuation consistent with adenoma. 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 2. Small umbilical hernia contains a short segment of small bowel without complicating features. 3. 2 adjacent 2-3 mm pulmonary  nodules noted in the left lower lobe. No follow-up needed if patient is low-risk (and has no known or suspected primary neoplasm). Non-contrast chest CT can be considered in 12 months if patient is high-risk. This recommendation follows the consensus statement: Guidelines for Management of Incidental Pulmonary Nodules Detected on CT Images: From the Fleischner Society 2017; Radiology 2017; 284:228-243. Electronically Signed   By: Misty Stanley M.D.   On: 03/15/2019 15:31    Assessment & Plan:   There are no diagnoses linked to this encounter.   No orders of the defined types were placed in this encounter.    Follow-up: No follow-ups on file.  Walker Kehr, MD

## 2019-10-25 NOTE — Assessment & Plan Note (Signed)
BP Readings from Last 3 Encounters:  10/25/19 116/82  06/21/19 112/74  03/22/19 110/70

## 2019-10-25 NOTE — Patient Instructions (Signed)

## 2019-10-25 NOTE — Assessment & Plan Note (Signed)
Dulera 2 puffs bid Face shield She is vaccinated for COVID 19

## 2019-10-25 NOTE — Assessment & Plan Note (Signed)
Vit D 

## 2019-10-25 NOTE — Assessment & Plan Note (Signed)
On B12 

## 2019-10-25 NOTE — Assessment & Plan Note (Signed)
Fasting, diet discussed Gentle yoga

## 2019-11-02 ENCOUNTER — Other Ambulatory Visit: Payer: Self-pay | Admitting: Internal Medicine

## 2019-11-07 ENCOUNTER — Other Ambulatory Visit: Payer: Self-pay | Admitting: Internal Medicine

## 2019-12-12 ENCOUNTER — Other Ambulatory Visit: Payer: Self-pay | Admitting: Internal Medicine

## 2019-12-26 ENCOUNTER — Ambulatory Visit: Payer: 59 | Admitting: Internal Medicine

## 2020-01-23 ENCOUNTER — Ambulatory Visit: Payer: 59 | Admitting: Internal Medicine

## 2020-01-23 ENCOUNTER — Other Ambulatory Visit: Payer: Self-pay

## 2020-01-23 ENCOUNTER — Encounter: Payer: Self-pay | Admitting: Internal Medicine

## 2020-01-23 DIAGNOSIS — L0292 Furuncle, unspecified: Secondary | ICD-10-CM

## 2020-01-23 MED ORDER — ACETAMINOPHEN-CODEINE 300-30 MG PO TABS: 0.5000 | ORAL_TABLET | Freq: Four times a day (QID) | ORAL | 0 refills | Status: DC | PRN

## 2020-01-23 MED ORDER — DOXYCYCLINE HYCLATE 100 MG PO TABS: 100.0000 mg | ORAL_TABLET | Freq: Two times a day (BID) | ORAL | 0 refills | Status: DC

## 2020-01-23 MED ORDER — MUPIROCIN 2 % EX OINT: TOPICAL_OINTMENT | CUTANEOUS | 0 refills | Status: DC

## 2020-01-23 NOTE — Patient Instructions (Signed)

## 2020-01-23 NOTE — Progress Notes (Signed)
Subjective:  Patient ID: [name], female    DOB: 01-May-1971  Age: 49 y.o. MRN: [id number]  CC: No chief complaint on file.   HPI [name] presents for  R axilla pain w/swelling x 1 week; draining x 2 d  Outpatient Medications Prior to Visit  Medication Sig Dispense Refill  . ALPRAZolam (XANAX) 0.5 MG tablet TAKE 1 TO 2 TABLETS BY MOUTH EVERY DAY AT BEDTIME 180 tablet 0  . Armodafinil 150 MG tablet TAKE 1 TABLET BY MOUTH DAILY 90 tablet 0  . cetirizine (ZYRTEC) 10 MG tablet Take 10 mg by mouth daily.    . Cholecalciferol (EQL VITAMIN D3) 1000 UNITS tablet Take 1,000 Units by mouth daily.      . cyclobenzaprine (FLEXERIL) 5 MG tablet TAKE 1 TABLET BY MOUTH THREE TIMES A DAY AS NEEDED FOR MUSCLE SPASMS 60 tablet 2  . fluticasone (FLONASE) 50 MCG/ACT nasal spray SPRAY 2 SPRAYS INTO EACH NOSTRIL EVERY DAY 48 g 11  . FYAVOLV 1-5 MG-MCG TABS tablet Take 1 tablet by mouth daily.    . meloxicam (MOBIC) 15 MG tablet Take 1 tablet (15 mg total) by mouth daily. 30 tablet 3  . metFORMIN (GLUCOPHAGE XR) 500 MG 24 hr tablet Take 1 tablet (500 mg total) by mouth daily with breakfast. 90 tablet 3  . mometasone-formoterol (DULERA) 200-5 MCG/ACT AERO Inhale 2 puffs into the lungs 2 (two) times daily. 13 g 5  . naproxen (NAPROSYN) 500 MG tablet Take 1 tablet (500 mg total) by mouth 2 (two) times daily as needed for moderate pain or headache. Use prn 60 tablet 3  . norethindrone-ethinyl estradiol (FEMHRT LOW DOSE) 0.5-2.5 MG-MCG tablet Take 1 tablet by mouth daily.    . Olmesartan-amLODIPine-HCTZ (TRIBENZOR) 40-5-12.5 MG TABS Take 1 tablet by mouth 1 day or 1 dose. 90 tablet 3  . ondansetron (ZOFRAN) 4 MG tablet Take 1 tablet (4 mg total) by mouth every 8 (eight) hours as needed for nausea or vomiting. 20 tablet 1  . oxymetazoline (AFRIN NASAL SPRAY) 0.05 % nasal spray Place 1 spray into both nostrils 2 (two) times daily. Use only for 3days, then stop 30 mL 0  . Potassium Chloride ER 20 MEQ  TBCR Take 1 tablet by mouth daily.    . potassium chloride SA (KLOR-CON) 20 MEQ tablet Take 1 tablet (20 mEq total) by mouth daily. 90 tablet 3  . Prenatal Vit-Fe Fumarate-FA (PRENATAL VITAMIN PLUS LOW IRON) 27-1 MG TABS TAKE 1 TABLET BY MOUTH EVERY DAY 90 tablet 3  . rizatriptan (MAXALT) 10 MG tablet Take 1 tablet (10 mg total) by mouth once as needed for up to 1 dose for migraine. May repeat in 2 hours if needed 12 tablet 5   No facility-administered medications prior to visit.    ROS: Review of Systems  Constitutional: Negative for activity change, appetite change, chills, fatigue, fever and unexpected weight change.  HENT: Negative for congestion, mouth sores and sinus pressure.   Eyes: Negative for visual disturbance.  Respiratory: Negative for cough and chest tightness.   Gastrointestinal: Negative for abdominal pain and nausea.  Genitourinary: Negative for difficulty urinating, frequency and vaginal pain.  Musculoskeletal: Negative for back pain and gait problem.  Skin: Positive for color change and wound. Negative for pallor and rash.  Neurological: Negative for dizziness, tremors, weakness, numbness and headaches.  Psychiatric/Behavioral: Negative for confusion and sleep disturbance.    Objective:  BP (!) 130/96 (BP Location: Left Arm, Patient Position: Sitting, Cuff Size:  Large)   Pulse 84   Temp 98.3 F (36.8 C) (Oral)   Ht 5\' 3"  (1.6 m)   Wt 248 lb (112.5 kg)   SpO2 97%   BMI 43.93 kg/m   BP Readings from Last 3 Encounters:  01/23/20 (!) 130/96  10/25/19 116/82  06/21/19 112/74    Wt Readings from Last 3 Encounters:  01/23/20 248 lb (112.5 kg)  10/25/19 228 lb (103.4 kg)  06/21/19 219 lb (99.3 kg)    Physical Exam Constitutional:      General: She is not in acute distress.    Appearance: She is well-developed. She is obese.  HENT:     Head: Normocephalic.     Right Ear: External ear normal.     Left Ear: External ear normal.     Nose: Nose normal.    Eyes:     General:        Right eye: No discharge.        Left eye: No discharge.     Conjunctiva/sclera: Conjunctivae normal.     Pupils: Pupils are equal, round, and reactive to light.  Neck:     Thyroid: No thyromegaly.     Vascular: No JVD.     Trachea: No tracheal deviation.  Cardiovascular:     Rate and Rhythm: Normal rate and regular rhythm.     Heart sounds: Normal heart sounds.  Pulmonary:     Effort: No respiratory distress.     Breath sounds: No stridor. No wheezing.  Abdominal:     General: Bowel sounds are normal. There is no distension.     Palpations: Abdomen is soft. There is no mass.     Tenderness: There is no abdominal tenderness. There is no guarding or rebound.  Musculoskeletal:        General: No tenderness.     Cervical back: Normal range of motion and neck supple.  Lymphadenopathy:     Cervical: No cervical adenopathy.  Skin:    Findings: Erythema and lesion present. No rash.  Neurological:     Cranial Nerves: No cranial nerve deficit.     Motor: No abnormal muscle tone.     Coordination: Coordination normal.     Deep Tendon Reflexes: Reflexes normal.  Psychiatric:        Behavior: Behavior normal.        Thought Content: Thought content normal.        Judgment: Judgment normal.   R axilla boil w/opening  Procedure note:  Incision and Drainage of an Abscess R axilla   Indication : a localized collection of pus that is tender and not spontaneously resolving.    Risks including unsuccessful procedure , possible need for a repeat procedure due to pus accumulation, scar formation, and others as well as benefits were explained to the patient in detail. Written consent was obtained/signed.    The patient was placed in a decubitus position. The area of an abscess was prepped with povidone-iodine and draped in a sterile fashion. Local anesthesia with   2    cc of 2% lidocaine and epinephrine  was administered.  1 cm incision with #11strait blade was  made. About 2 cc of purulent material was expressed. The abscess cavity was explored with a sterile hemostat and the walled- off pockets and septae were broken down bluntly. The cavity was irrigated with the rest of the anesthetic in the syringe and packed with 3-4 inches of  the iodoform gauze.   The  wound was dressed with antibiotic ointment and Telfa pad.  Tolerated well. Complications: None.   Wound instructions provided.   Wound instructions : change dressing once a day or twice a day is needed. Change dressing after  shower in the morning.  Pat dry the wound with gauze. Pull out one inch of packing everyday and cut it off. Re-dress wound with antibiotic ointment and Telfa pad or a Band-Aid of appropriate size.   Please contact us if you notice a recollection of pus in the abscess fever and chills increased pain redness red streaks near the abscess increased swelling in the area.   Lab Results  Component Value Date   WBC 5.9 11/25/2018   HGB 13.0 11/25/2018   HCT 38.8 11/25/2018   PLT 139.0 (L) 11/25/2018   GLUCOSE 90 06/21/2019   CHOL 180 09/30/2017   TRIG 67.0 09/30/2017   HDL 56.30 09/30/2017   LDLCALC 111 (H) 09/30/2017   ALT 12 11/25/2018   AST 16 11/25/2018   NA 140 06/21/2019   K 3.6 06/21/2019   CL 101 06/21/2019   CREATININE 0.88 06/21/2019   BUN 12 06/21/2019   CO2 31 06/21/2019   TSH 0.69 11/25/2018   INR 0.9 RATIO 03/15/2007   HGBA1C 6.0 11/25/2018    CT ABDOMEN WO CONTRAST  Result Date: 03/15/2019 CLINICAL DATA:  Right adrenal nodule. EXAM: CT ABDOMEN WITHOUT CONTRAST TECHNIQUE: Multidetector CT imaging of the abdomen was performed following the standard protocol without IV contrast. COMPARISON:  10/22/2012 FINDINGS: Lower chest: Adjacent 2-3 mm pulmonary nodules in the left lower lobe appear new since prior study (image 5/series 3) Hepatobiliary: The liver shows diffusely decreased attenuation suggesting fat deposition. There is no evidence for gallstones,  gallbladder wall thickening, or pericholecystic fluid. No intrahepatic or extrahepatic biliary dilation. Pancreas: No focal mass lesion. No dilatation of the main duct. No intraparenchymal cyst. No peripancreatic edema. Spleen: No splenomegaly. No focal mass lesion. Adrenals/Urinary Tract: Left adrenal gland unremarkable. Small right adrenal nodules are similar to prior. The more posterior of the 2 nodules measures 15 mm today which is unchanged when I remeasure on the prior study. The more anterior of the 2 nodules measures 2.2 cm which is also stable when I remeasure it on the prior exam. This nodule has an average attenuation of 7.9 Hounsfield units, consistent with adenoma. Kidneys unremarkable. Stomach/Bowel: Tiny hiatal hernia. Stomach is unremarkable. No gastric wall thickening. No evidence of outlet obstruction. Duodenum is normally positioned as is the ligament of Treitz. Visualized small bowel loops and colonic segments of the abdomen are unremarkable. Vascular/Lymphatic: No abdominal aortic aneurysm. Other: No intraperitoneal free fluid. Musculoskeletal: Small umbilical hernia contains a short segment of small bowel without complicating features. No worrisome lytic or sclerotic osseous abnormality. IMPRESSION: 1. 2 right adrenal nodules are stable in the 6 year interval since prior study consistent with benign etiology. The more anterior of these 2 nodules has low attenuation consistent with adenoma. 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 2. Small umbilical hernia contains a short segment of small bowel without complicating features. 3. 2 adjacent 2-3 mm pulmonary nodules noted in the left lower lobe. No follow-up needed if patient is low-risk (and has no known or suspected primary neoplasm). Non-contrast chest CT can be considered in 12 months if patient is high-risk. This recommendation follows the consensus statement: Guidelines for  Management of Incidental Pulmonary Nodules Detected on CT Images: From the Fleischner  Society 2017; Radiology 2017; [phone]. Electronically Signed   By: Misty Stanley M.D.   On: 03/15/2019 15:31    Assessment & Plan:   There are no diagnoses linked to this encounter.   No orders of the defined types were placed in this encounter.    Follow-up: No follow-ups on file.  Walker Kehr, MD

## 2020-01-23 NOTE — Assessment & Plan Note (Signed)
See I&D Doxy Bactroban

## 2020-02-16 ENCOUNTER — Encounter: Payer: Self-pay | Admitting: Internal Medicine

## 2020-02-16 ENCOUNTER — Ambulatory Visit: Payer: 59 | Admitting: Internal Medicine

## 2020-02-16 ENCOUNTER — Other Ambulatory Visit: Payer: Self-pay

## 2020-02-16 DIAGNOSIS — L732 Hidradenitis suppurativa: Secondary | ICD-10-CM

## 2020-02-16 DIAGNOSIS — L0292 Furuncle, unspecified: Secondary | ICD-10-CM | POA: Diagnosis not present

## 2020-02-16 MED ORDER — DOXYCYCLINE HYCLATE 100 MG PO TABS: 100.0000 mg | ORAL_TABLET | Freq: Two times a day (BID) | ORAL | 3 refills | Status: DC

## 2020-02-16 MED ORDER — HIBICLENS 4 % EX LIQD: Freq: Every day | CUTANEOUS | 3 refills | Status: DC | PRN

## 2020-02-16 NOTE — Assessment & Plan Note (Signed)
No DM Doxy prn Hibiclens scrub Surg or Derm ref offered

## 2020-02-16 NOTE — Progress Notes (Signed)
Subjective:  Patient ID: [name], female    DOB: 01/11/1971  Age: 49 y.o. MRN: [id number]  CC: No chief complaint on file.   HPI Linley Moskal presents for R axilla boil forming for a few days  Outpatient Medications Prior to Visit  Medication Sig Dispense Refill  . Acetaminophen-Codeine (TYLENOL/CODEINE #3) 300-30 MG tablet Take 0.5-1 tablets by mouth every 6 (six) hours as needed for pain. 12 tablet 0  . ALPRAZolam (XANAX) 0.5 MG tablet TAKE 1 TO 2 TABLETS BY MOUTH EVERY DAY AT BEDTIME 180 tablet 0  . Armodafinil 150 MG tablet TAKE 1 TABLET BY MOUTH DAILY 90 tablet 0  . cetirizine (ZYRTEC) 10 MG tablet Take 10 mg by mouth daily.    . Cholecalciferol (EQL VITAMIN D3) 1000 UNITS tablet Take 1,000 Units by mouth daily.      . cyclobenzaprine (FLEXERIL) 5 MG tablet TAKE 1 TABLET BY MOUTH THREE TIMES A DAY AS NEEDED FOR MUSCLE SPASMS 60 tablet 2  . doxycycline (VIBRA-TABS) 100 MG tablet Take 1 tablet (100 mg total) by mouth 2 (two) times daily. 20 tablet 0  . fluticasone (FLONASE) 50 MCG/ACT nasal spray SPRAY 2 SPRAYS INTO EACH NOSTRIL EVERY DAY 48 g 11  . FYAVOLV 1-5 MG-MCG TABS tablet Take 1 tablet by mouth daily.    . meloxicam (MOBIC) 15 MG tablet Take 1 tablet (15 mg total) by mouth daily. 30 tablet 3  . metFORMIN (GLUCOPHAGE XR) 500 MG 24 hr tablet Take 1 tablet (500 mg total) by mouth daily with breakfast. 90 tablet 3  . mometasone-formoterol (DULERA) 200-5 MCG/ACT AERO Inhale 2 puffs into the lungs 2 (two) times daily. 13 g 5  . mupirocin ointment (BACTROBAN) 2 % On leg wound w/dressing change qd or bid 30 g 0  . naproxen (NAPROSYN) 500 MG tablet Take 1 tablet (500 mg total) by mouth 2 (two) times daily as needed for moderate pain or headache. Use prn 60 tablet 3  . norethindrone-ethinyl estradiol (FEMHRT LOW DOSE) 0.5-2.5 MG-MCG tablet Take 1 tablet by mouth daily.    . Olmesartan-amLODIPine-HCTZ (TRIBENZOR) 40-5-12.5 MG TABS Take 1 tablet by mouth 1 day or 1 dose. 90  tablet 3  . ondansetron (ZOFRAN) 4 MG tablet Take 1 tablet (4 mg total) by mouth every 8 (eight) hours as needed for nausea or vomiting. 20 tablet 1  . oxymetazoline (AFRIN NASAL SPRAY) 0.05 % nasal spray Place 1 spray into both nostrils 2 (two) times daily. Use only for 3days, then stop 30 mL 0  . Potassium Chloride ER 20 MEQ TBCR Take 1 tablet by mouth daily.    . potassium chloride SA (KLOR-CON) 20 MEQ tablet Take 1 tablet (20 mEq total) by mouth daily. 90 tablet 3  . Prenatal Vit-Fe Fumarate-FA (PRENATAL VITAMIN PLUS LOW IRON) 27-1 MG TABS TAKE 1 TABLET BY MOUTH EVERY DAY 90 tablet 3  . rizatriptan (MAXALT) 10 MG tablet Take 1 tablet (10 mg total) by mouth once as needed for up to 1 dose for migraine. May repeat in 2 hours if needed 12 tablet 5   No facility-administered medications prior to visit.    ROS: Review of Systems  Skin: Positive for color change.    Objective:  BP 112/88 (BP Location: Right Arm, Patient Position: Sitting, Cuff Size: Large)   Pulse 88   Temp 98.3 F (36.8 C) (Oral)   Ht 5\' 3"  (1.6 m)   Wt 237 lb (107.5 kg)   SpO2 97%   BMI  41.98 kg/m   BP Readings from Last 3 Encounters:  02/16/20 112/88  01/23/20 (!) 130/96  10/25/19 116/82    Wt Readings from Last 3 Encounters:  02/16/20 237 lb (107.5 kg)  01/23/20 248 lb (112.5 kg)  10/25/19 228 lb (103.4 kg)    Physical Exam Constitutional:      Appearance: She is obese.  Skin:    Findings: Erythema and lesion present.  Neurological:     Mental Status: She is alert and oriented to person, place, and time.    Skin infiltration in the R axilla   Lab Results  Component Value Date   WBC 5.9 11/25/2018   HGB 13.0 11/25/2018   HCT 38.8 11/25/2018   PLT 139.0 (L) 11/25/2018   GLUCOSE 90 06/21/2019   CHOL 180 09/30/2017   TRIG 67.0 09/30/2017   HDL 56.30 09/30/2017   LDLCALC 111 (H) 09/30/2017   ALT 12 11/25/2018   AST 16 11/25/2018   NA 140 06/21/2019   K 3.6 06/21/2019   CL 101 06/21/2019     CREATININE 0.88 06/21/2019   BUN 12 06/21/2019   CO2 31 06/21/2019   TSH 0.69 11/25/2018   INR 0.9 RATIO 03/15/2007   HGBA1C 6.0 11/25/2018    CT ABDOMEN WO CONTRAST  Result Date: 03/15/2019 CLINICAL DATA:  Right adrenal nodule. EXAM: CT ABDOMEN WITHOUT CONTRAST TECHNIQUE: Multidetector CT imaging of the abdomen was performed following the standard protocol without IV contrast. COMPARISON:  10/22/2012 FINDINGS: Lower chest: Adjacent 2-3 mm pulmonary nodules in the left lower lobe appear new since prior study (image 5/series 3) Hepatobiliary: The liver shows diffusely decreased attenuation suggesting fat deposition. There is no evidence for gallstones, gallbladder wall thickening, or pericholecystic fluid. No intrahepatic or extrahepatic biliary dilation. Pancreas: No focal mass lesion. No dilatation of the main duct. No intraparenchymal cyst. No peripancreatic edema. Spleen: No splenomegaly. No focal mass lesion. Adrenals/Urinary Tract: Left adrenal gland unremarkable. Small right adrenal nodules are similar to prior. The more posterior of the 2 nodules measures 15 mm today which is unchanged when I remeasure on the prior study. The more anterior of the 2 nodules measures 2.2 cm which is also stable when I remeasure it on the prior exam. This nodule has an average attenuation of 7.9 Hounsfield units, consistent with adenoma. Kidneys unremarkable. Stomach/Bowel: Tiny hiatal hernia. Stomach is unremarkable. No gastric wall thickening. No evidence of outlet obstruction. Duodenum is normally positioned as is the ligament of Treitz. Visualized small bowel loops and colonic segments of the abdomen are unremarkable. Vascular/Lymphatic: No abdominal aortic aneurysm. Other: No intraperitoneal free fluid. Musculoskeletal: Small umbilical hernia contains a short segment of small bowel without complicating features. No worrisome lytic or sclerotic osseous abnormality. IMPRESSION: 1. 2 right adrenal nodules are  stable in the 6 year interval since prior study consistent with benign etiology. The more anterior of these 2 nodules has low attenuation consistent with adenoma. 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 2. Small umbilical hernia contains a short segment of small bowel without complicating features. 3. 2 adjacent 2-3 mm pulmonary nodules noted in the left lower lobe. No follow-up needed if patient is low-risk (and has no known or suspected primary neoplasm). Non-contrast chest CT can be considered in 12 months if patient is high-risk. This recommendation follows the consensus statement: Guidelines for Management of Incidental Pulmonary Nodules Detected on CT Images: From the Fleischner Society 2017; Radiology 2017; 284:228-243. Electronically  Signed   By: Misty Stanley M.D.   On: 03/15/2019 15:31    Assessment & Plan:   There are no diagnoses linked to this encounter.   No orders of the defined types were placed in this encounter.    Follow-up: No follow-ups on file.  Walker Kehr, MD

## 2020-02-16 NOTE — Assessment & Plan Note (Signed)
Doxy prn Hibiclens scrub Surg or Derm ref offered

## 2020-02-20 ENCOUNTER — Other Ambulatory Visit: Payer: Self-pay | Admitting: Internal Medicine

## 2020-03-05 ENCOUNTER — Ambulatory Visit (INDEPENDENT_AMBULATORY_CARE_PROVIDER_SITE_OTHER): Payer: 59

## 2020-03-05 ENCOUNTER — Encounter: Payer: Self-pay | Admitting: Internal Medicine

## 2020-03-05 ENCOUNTER — Other Ambulatory Visit: Payer: Self-pay

## 2020-03-05 ENCOUNTER — Ambulatory Visit: Payer: 59 | Admitting: Internal Medicine

## 2020-03-05 DIAGNOSIS — E559 Vitamin D deficiency, unspecified: Secondary | ICD-10-CM | POA: Diagnosis not present

## 2020-03-05 DIAGNOSIS — E538 Deficiency of other specified B group vitamins: Secondary | ICD-10-CM | POA: Diagnosis not present

## 2020-03-05 DIAGNOSIS — M5430 Sciatica, unspecified side: Secondary | ICD-10-CM | POA: Insufficient documentation

## 2020-03-05 DIAGNOSIS — M5432 Sciatica, left side: Secondary | ICD-10-CM

## 2020-03-05 MED ORDER — CYCLOBENZAPRINE HCL 5 MG PO TABS: ORAL_TABLET | ORAL | 2 refills | Status: DC

## 2020-03-05 MED ORDER — NAPROXEN 500 MG PO TABS: 500.0000 mg | ORAL_TABLET | Freq: Two times a day (BID) | ORAL | 3 refills | Status: DC | PRN

## 2020-03-05 NOTE — Patient Instructions (Signed)
Piriformis Syndrome  Piriformis syndrome is a condition that can cause pain and numbness in your buttocks and down the back of your leg. Piriformis syndrome happens when the small muscle that connects the base of your spine to your hip (piriformis muscle) presses on the nerve that runs down the back of your leg (sciatic nerve). The piriformis muscle helps your hip rotate and helps to bring your leg back and out. It also helps shift your weight to keep you stable while you are walking. The sciatic nerve runs under or through the piriformis muscle. Damage to the piriformis muscle can cause spasms that put pressure on the nerve below. This causes pain and discomfort while sitting and moving. The pain may feel as if it begins in the buttock and spreads (radiates) down your hip and thigh. What are the causes? This condition is caused by pressure on the sciatic nerve from the piriformis muscle. The piriformis muscle can get irritated with overuse, especially if other hip muscles are weak and the piriformis muscle has to do extra work. Piriformis syndrome can also occur after an injury, like a fall onto your buttocks. What increases the risk? You are more likely to develop this condition if you:  Are a woman.  Sit for long periods of time.  Are a cyclist.  Have weak buttocks muscles (gluteal muscles). What are the signs or symptoms? Symptoms of this condition include:  Pain, tingling, or numbness that starts in the buttock and runs down the back of your leg (sciatica).  Pain in the groin or thigh area. Your symptoms may get worse:  The longer you sit.  When you walk, run, or climb stairs.  When straining to have a bowel movement. How is this diagnosed? This condition is diagnosed based on your symptoms, medical history, and physical exam.  During the exam, your health care provider may: ? Move your leg into different positions to check for pain. ? Press on the muscles of your hip and  buttock to see if that increases your symptoms.  You may also have tests, including: ? Imaging tests such as X-rays, MRI, or ultrasound. ? Electromyogram (EMG). This test measures electrical signals sent by your nerves into the muscles. ? Nerve conduction study. This test measures how well electrical signals pass through your nerves. How is this treated? This condition may be treated by:  Stopping all activities that cause pain or make your condition worse.  Applying ice or using heat therapy.  Taking medicines to reduce pain and swelling.  Taking a muscle relaxer (muscle relaxant) to stop muscle spasms.  Doing range-of-motion and strengthening exercises (physical therapy) as told by your health care provider.  Massaging the area.  Having acupuncture.  Getting an injection of medicine in the piriformis muscle. Your health care provider will choose the medicine based on your condition. He or she may inject: ? An anti-inflammatory medicine (steroid) to reduce swelling. ? A numbing medicine (local anesthetic) to block the pain. ? Botulinum toxin. The toxin blocks nerve impulses to specific muscles to reduce muscle tension. In rare cases, you may need surgery to cut the muscle and release pressure on the nerve if other treatments do not work. Follow these instructions at home: Activity  Do not sit for long periods. Get up and walk around every 20 minutes or as often as told by your health care provider. ? When driving long distances, make sure to take frequent stops to get up and stretch.  Use a  cushion when you sit on hard surfaces.  Do exercises as told by your health care provider.  Return to your normal activities as told by your health care provider. Ask your health care provider what activities are safe for you. Managing pain, stiffness, and swelling      If directed, apply heat to the affected area as often as told by your health care provider. Use the heat source that  your health care provider recommends, such as a moist heat pack or a heating pad. ? Place a towel between your skin and the heat source. ? Leave the heat on for 20-30 minutes. ? Remove the heat if your skin turns bright red. This is especially important if you are unable to feel pain, heat, or cold. You may have a greater risk of getting burned.  If directed, put ice on the injured area. ? Put ice in a plastic bag. ? Place a towel between your skin and the bag. ? Leave the ice on for 20 minutes, 2-3 times a day. General instructions  Take over-the-counter and prescription medicines only as told by your health care provider.  Ask your health care provider if the medicine prescribed to you requires you to avoid driving or using heavy machinery.  You may need to take actions to prevent or treat constipation, such as: ? Drink enough fluid to keep your urine pale yellow. ? Take over-the-counter or prescription medicines. ? Eat foods that are high in fiber, such as beans, whole grains, and fresh fruits and vegetables. ? Limit foods that are high in fat and processed sugars, such as fried or sweet foods.  Keep all follow-up visits as told by your health care provider. This is important. How is this prevented?  Do not sit for longer than 20 minutes at a time. When you sit, choose padded surfaces.  Warm up and stretch before being active.  Cool down and stretch after being active.  Give your body time to rest between periods of activity.  Make sure to use equipment that fits you.  Maintain physical fitness, including: ? Strength. ? Flexibility. Contact a health care provider if:  Your pain and stiffness continue or get worse.  Your leg or hip becomes weak.  You have changes in your bowel function or bladder function. Summary  Piriformis syndrome is a condition that can cause pain, tingling, and numbness in your buttocks and down the back of your leg.  You may try applying heat  or ice to relieve the pain.  Do not sit for long periods. Get up and walk around every 20 minutes or as often as told by your health care provider. This information is not intended to replace advice given to you by your health care provider. Make sure you discuss any questions you have with your health care provider. Document Revised: 11/11/2018 Document Reviewed: 03/17/2018 Elsevier Patient Education  [place].  Piriformis Syndrome Rehab Ask your health care provider which exercises are safe for you. Do exercises exactly as told by your health care provider and adjust them as directed. It is normal to feel mild stretching, pulling, tightness, or discomfort as you do these exercises. Stop right away if you feel sudden pain or your pain gets worse. Do not begin these exercises until told by your health care provider. Stretching and range-of-motion exercises These exercises warm up your muscles and joints and improve the movement and flexibility of your hip and pelvis. The exercises also help to relieve  pain, numbness, and tingling. Hip rotation This is an exercise in which you lie on your back and stretch the muscles that rotate your hip (hip rotators) to stretch your buttocks. 1. Lie on your back on a firm surface. 2. Pull your left / right knee toward your same shoulder with your left / right hand until your knee is pointing toward the ceiling. Hold your left / right ankle with your other hand. 3. Keeping your knee steady, gently pull your left / right ankle toward your other shoulder until you feel a stretch in your buttocks. 4. Hold this position for __________ seconds. Repeat __________ times. Complete this exercise __________ times a day. Hip extensor This is an exercise in which you lie on your back and pull your knee to your chest. 1. Lie on your back on a firm surface. Both of your legs should be straight. 2. Pull your left / right knee to your chest. Hold your leg in this  position by holding onto the back of your thigh or the front of your knee. 3. Hold this position for __________ seconds. 4. Slowly return to the starting position. Repeat __________ times. Complete this exercise __________ times a day. Strengthening exercises These exercises build strength and endurance in your hip and thigh muscles. Endurance is the ability to use your muscles for a long time, even after they get tired. Straight leg raises, side-lying This exercise strengthens the muscles that rotate the leg at the hip and move it away from your body (hip abductors). 1. Lie on your side with your left / right leg in the top position. Lie so your head, shoulder, knee, and hip line up. Bend your bottom knee to help you balance. 2. Lift your top leg 4-6 inches (10-15 cm) while keeping your toes pointed straight ahead. 3. Hold this position for __________ seconds. 4. Slowly lower your leg to the starting position. 5. Let your muscles relax completely after each repetition. Repeat __________ times. Complete this exercise __________ times a day. Hip abduction and rotation This is sometimes called quadruped (on hands and knees) exercises. 1. Get on your hands and knees on a firm, lightly padded surface. Your hands should be directly below your shoulders, and your knees should be directly below your hips. 2. Lift your left / right knee out to the side. Keep your knee bent. Do not twist your body. 3. Hold this position for __________ seconds. 4. Slowly lower your leg. Repeat __________ times. Complete this exercise __________ times a day. Straight leg raises, face-down This exercise stretches the muscles that move your hips away from the front of the pelvis (hip extensors). 1. Lie on your abdomen on a bed or a firm surface with a pillow under your hips. 2. Squeeze your buttocks muscles and lift your left / right leg about 4-6 inches (10-15 cm) off the bed. Do not let your back arch. 3. Hold this  position for __________ seconds. 4. Slowly lower your leg to the starting position. 5. Let your muscles relax completely after each repetition. Repeat __________ times. Complete this exercise __________ times a day. This information is not intended to replace advice given to you by your health care provider. Make sure you discuss any questions you have with your health care provider. Document Revised: 11/11/2018 Document Reviewed: 05/13/2018 Elsevier Patient Education  [place].

## 2020-03-05 NOTE — Assessment & Plan Note (Signed)
On Vit D 

## 2020-03-05 NOTE — Assessment & Plan Note (Addendum)
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

## 2020-03-05 NOTE — Progress Notes (Signed)
Subjective:  Patient ID: [name], female    DOB: 05-Jan-1971  Age: 49 y.o. MRN: [id number]  CC: No chief complaint on file.   HPI [name] presents for LLE pain x 2 week - dull, throbbing - down to L ankle. No injury. C/o L LBP Worse w/putting pressure on it. Naproxen helps. Pain is 7/10 at times... Working w/a Clinical research associate   Outpatient Medications Prior to Visit  Medication Sig Dispense Refill  . Acetaminophen-Codeine (TYLENOL/CODEINE #3) 300-30 MG tablet Take 0.5-1 tablets by mouth every 6 (six) hours as needed for pain. 12 tablet 0  . ALPRAZolam (XANAX) 0.5 MG tablet TAKE 1 TO 2 TABLETS BY MOUTH EVERY DAY AT BEDTIME 180 tablet 0  . Armodafinil 150 MG tablet TAKE 1 TABLET BY MOUTH DAILY 90 tablet 0  . cetirizine (ZYRTEC) 10 MG tablet Take 10 mg by mouth daily.    . chlorhexidine (HIBICLENS) 4 % external liquid Apply topically daily as needed. Use 3/week 1000 mL 3  . Cholecalciferol (EQL VITAMIN D3) 1000 UNITS tablet Take 1,000 Units by mouth daily.      . cyclobenzaprine (FLEXERIL) 5 MG tablet TAKE 1 TABLET BY MOUTH THREE TIMES A DAY AS NEEDED FOR MUSCLE SPASMS 60 tablet 2  . doxycycline (VIBRA-TABS) 100 MG tablet Take 1 tablet (100 mg total) by mouth 2 (two) times daily. 20 tablet 3  . fluticasone (FLONASE) 50 MCG/ACT nasal spray SPRAY 2 SPRAYS INTO EACH NOSTRIL EVERY DAY 48 g 11  . FYAVOLV 1-5 MG-MCG TABS tablet Take 1 tablet by mouth daily.    . meloxicam (MOBIC) 15 MG tablet Take 1 tablet (15 mg total) by mouth daily. 30 tablet 3  . metFORMIN (GLUCOPHAGE XR) 500 MG 24 hr tablet Take 1 tablet (500 mg total) by mouth daily with breakfast. 90 tablet 3  . mometasone-formoterol (DULERA) 200-5 MCG/ACT AERO Inhale 2 puffs into the lungs 2 (two) times daily. 13 g 5  . mupirocin ointment (BACTROBAN) 2 % On leg wound w/dressing change qd or bid 30 g 0  . naproxen (NAPROSYN) 500 MG tablet Take 1 tablet (500 mg total) by mouth 2 (two) times daily as needed for moderate pain or  headache. Use prn 60 tablet 3  . norethindrone-ethinyl estradiol (FEMHRT LOW DOSE) 0.5-2.5 MG-MCG tablet Take 1 tablet by mouth daily.    . Olmesartan-amLODIPine-HCTZ (TRIBENZOR) 40-5-12.5 MG TABS Take 1 tablet by mouth 1 day or 1 dose. 90 tablet 3  . ondansetron (ZOFRAN) 4 MG tablet Take 1 tablet (4 mg total) by mouth every 8 (eight) hours as needed for nausea or vomiting. 20 tablet 1  . oxymetazoline (AFRIN NASAL SPRAY) 0.05 % nasal spray Place 1 spray into both nostrils 2 (two) times daily. Use only for 3days, then stop 30 mL 0  . Potassium Chloride ER 20 MEQ TBCR Take 1 tablet by mouth daily.    . potassium chloride SA (KLOR-CON) 20 MEQ tablet Take 1 tablet (20 mEq total) by mouth daily. 90 tablet 3  . Prenatal Vit-Fe Fumarate-FA (PRENATAL VITAMIN PLUS LOW IRON) 27-1 MG TABS TAKE 1 TABLET BY MOUTH EVERY DAY 90 tablet 3  . rizatriptan (MAXALT) 10 MG tablet Take 1 tablet (10 mg total) by mouth once as needed for up to 1 dose for migraine. May repeat in 2 hours if needed 12 tablet 5   No facility-administered medications prior to visit.    ROS: Review of Systems  Constitutional: Negative for activity change, appetite change, chills, fatigue and unexpected  weight change.  HENT: Negative for congestion, mouth sores and sinus pressure.   Eyes: Negative for visual disturbance.  Respiratory: Negative for cough and chest tightness.   Gastrointestinal: Negative for abdominal pain and nausea.  Genitourinary: Negative for difficulty urinating, frequency and vaginal pain.  Musculoskeletal: Positive for back pain and gait problem.  Skin: Negative for pallor and rash.  Neurological: Negative for dizziness, tremors, weakness, numbness and headaches.  Psychiatric/Behavioral: Negative for confusion and sleep disturbance.    Objective:  BP 118/82 (BP Location: Left Arm, Patient Position: Sitting, Cuff Size: Large)   Pulse 96   Temp 98.3 F (36.8 C) (Oral)   Ht 5\' 3"  (1.6 m)   Wt (!) 241 lb (109.3  kg)   SpO2 99%   BMI 42.69 kg/m   BP Readings from Last 3 Encounters:  03/05/20 118/82  02/16/20 112/88  01/23/20 (!) 130/96    Wt Readings from Last 3 Encounters:  03/05/20 (!) 241 lb (109.3 kg)  02/16/20 237 lb (107.5 kg)  01/23/20 248 lb (112.5 kg)    Physical Exam Constitutional:      General: She is not in acute distress.    Appearance: She is well-developed.  HENT:     Head: Normocephalic.     Right Ear: External ear normal.     Left Ear: External ear normal.     Nose: Nose normal.  Eyes:     General:        Right eye: No discharge.        Left eye: No discharge.     Conjunctiva/sclera: Conjunctivae normal.     Pupils: Pupils are equal, round, and reactive to light.  Neck:     Thyroid: No thyromegaly.     Vascular: No JVD.     Trachea: No tracheal deviation.  Cardiovascular:     Rate and Rhythm: Normal rate and regular rhythm.     Heart sounds: Normal heart sounds.  Pulmonary:     Effort: No respiratory distress.     Breath sounds: No stridor. No wheezing.  Abdominal:     General: Bowel sounds are normal. There is no distension.     Palpations: Abdomen is soft. There is no mass.     Tenderness: There is no abdominal tenderness. There is no guarding or rebound.  Musculoskeletal:        General: Tenderness present.     Cervical back: Normal range of motion and neck supple.  Lymphadenopathy:     Cervical: No cervical adenopathy.  Skin:    Findings: No erythema or rash.  Neurological:     Cranial Nerves: No cranial nerve deficit.     Motor: No weakness or abnormal muscle tone.     Coordination: Coordination normal.     Deep Tendon Reflexes: Reflexes normal.  Psychiatric:        Behavior: Behavior normal.        Thought Content: Thought content normal.        Judgment: Judgment normal.   Str leg elev (-) B Hips OK L buttock - painful L ankle - WNL  FTF>25 min  Lab Results  Component Value Date   WBC 5.9 11/25/2018   HGB 13.0 11/25/2018   HCT  38.8 11/25/2018   PLT 139.0 (L) 11/25/2018   GLUCOSE 90 06/21/2019   CHOL 180 09/30/2017   TRIG 67.0 09/30/2017   HDL 56.30 09/30/2017   LDLCALC 111 (H) 09/30/2017   ALT 12 11/25/2018   AST 16 11/25/2018  NA 140 06/21/2019   K 3.6 06/21/2019   CL 101 06/21/2019   CREATININE 0.88 06/21/2019   BUN 12 06/21/2019   CO2 31 06/21/2019   TSH 0.69 11/25/2018   INR 0.9 RATIO 03/15/2007   HGBA1C 6.0 11/25/2018    CT ABDOMEN WO CONTRAST  Result Date: 03/15/2019 CLINICAL DATA:  Right adrenal nodule. EXAM: CT ABDOMEN WITHOUT CONTRAST TECHNIQUE: Multidetector CT imaging of the abdomen was performed following the standard protocol without IV contrast. COMPARISON:  10/22/2012 FINDINGS: Lower chest: Adjacent 2-3 mm pulmonary nodules in the left lower lobe appear new since prior study (image 5/series 3) Hepatobiliary: The liver shows diffusely decreased attenuation suggesting fat deposition. There is no evidence for gallstones, gallbladder wall thickening, or pericholecystic fluid. No intrahepatic or extrahepatic biliary dilation. Pancreas: No focal mass lesion. No dilatation of the main duct. No intraparenchymal cyst. No peripancreatic edema. Spleen: No splenomegaly. No focal mass lesion. Adrenals/Urinary Tract: Left adrenal gland unremarkable. Small right adrenal nodules are similar to prior. The more posterior of the 2 nodules measures 15 mm today which is unchanged when I remeasure on the prior study. The more anterior of the 2 nodules measures 2.2 cm which is also stable when I remeasure it on the prior exam. This nodule has an average attenuation of 7.9 Hounsfield units, consistent with adenoma. Kidneys unremarkable. Stomach/Bowel: Tiny hiatal hernia. Stomach is unremarkable. No gastric wall thickening. No evidence of outlet obstruction. Duodenum is normally positioned as is the ligament of Treitz. Visualized small bowel loops and colonic segments of the abdomen are unremarkable. Vascular/Lymphatic: No  abdominal aortic aneurysm. Other: No intraperitoneal free fluid. Musculoskeletal: Small umbilical hernia contains a short segment of small bowel without complicating features. No worrisome lytic or sclerotic osseous abnormality. IMPRESSION: 1. 2 right adrenal nodules are stable in the 6 year interval since prior study consistent with benign etiology. The more anterior of these 2 nodules has low attenuation consistent with adenoma. 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 2. Small umbilical hernia contains a short segment of small bowel without complicating features. 3. 2 adjacent 2-3 mm pulmonary nodules noted in the left lower lobe. No follow-up needed if patient is low-risk (and has no known or suspected primary neoplasm). Non-contrast chest CT can be considered in 12 months if patient is high-risk. This recommendation follows the consensus statement: Guidelines for Management of Incidental Pulmonary Nodules Detected on CT Images: From the Fleischner Society 2017; Radiology 2017; 284:228-243. Electronically Signed   By: Misty Stanley M.D.   On: 03/15/2019 15:31    Assessment & Plan:   There are no diagnoses linked to this encounter.   No orders of the defined types were placed in this encounter.    Follow-up: No follow-ups on file.  Walker Kehr, MD

## 2020-03-05 NOTE — Assessment & Plan Note (Signed)
On B12 

## 2020-03-22 ENCOUNTER — Other Ambulatory Visit: Payer: Self-pay | Admitting: Internal Medicine

## 2020-03-25 ENCOUNTER — Other Ambulatory Visit: Payer: Self-pay | Admitting: Internal Medicine

## 2020-04-16 ENCOUNTER — Ambulatory Visit: Payer: 59 | Admitting: Internal Medicine

## 2020-04-25 ENCOUNTER — Ambulatory Visit: Payer: 59 | Admitting: Internal Medicine

## 2020-04-25 ENCOUNTER — Encounter: Payer: Self-pay | Admitting: Internal Medicine

## 2020-04-25 ENCOUNTER — Other Ambulatory Visit: Payer: Self-pay

## 2020-04-25 VITALS — BP 142/90 | HR 78 | Temp 98.5°F | Ht 63.0 in | Wt 235.0 lb

## 2020-04-25 DIAGNOSIS — G8929 Other chronic pain: Secondary | ICD-10-CM

## 2020-04-25 DIAGNOSIS — E559 Vitamin D deficiency, unspecified: Secondary | ICD-10-CM

## 2020-04-25 DIAGNOSIS — M5416 Radiculopathy, lumbar region: Secondary | ICD-10-CM

## 2020-04-25 DIAGNOSIS — M545 Low back pain: Secondary | ICD-10-CM

## 2020-04-25 DIAGNOSIS — L732 Hidradenitis suppurativa: Secondary | ICD-10-CM

## 2020-04-25 DIAGNOSIS — N62 Hypertrophy of breast: Secondary | ICD-10-CM | POA: Diagnosis not present

## 2020-04-25 DIAGNOSIS — Z23 Encounter for immunization: Secondary | ICD-10-CM

## 2020-04-25 MED ORDER — DOXYCYCLINE HYCLATE 100 MG PO TABS: 100.0000 mg | ORAL_TABLET | Freq: Two times a day (BID) | ORAL | 3 refills | Status: DC

## 2020-04-25 MED ORDER — HIBICLENS 4 % EX LIQD: Freq: Every day | CUTANEOUS | 3 refills | Status: DC | PRN

## 2020-04-25 NOTE — Assessment & Plan Note (Signed)
Recurrent Large breasts discussed - breast reduction should help Doxy

## 2020-04-25 NOTE — Progress Notes (Signed)
Subjective:  Patient ID: [name], female    DOB: 12-26-1970  Age: 49 y.o. MRN: [id number]  CC: No chief complaint on file.   HPI [name] presents for LBP R>L chronic, worse x 2 wks irrad down to the back RLE down to the knee; can't move at times. No weakness C/o stress  Outpatient Medications Prior to Visit  Medication Sig Dispense Refill  . Acetaminophen-Codeine (TYLENOL/CODEINE #3) 300-30 MG tablet Take 0.5-1 tablets by mouth every 6 (six) hours as needed for pain. 12 tablet 0  . ALPRAZolam (XANAX) 0.5 MG tablet TAKE 1 TO 2 TABLETS BY MOUTH EVERY DAY AT BEDTIME 180 tablet 0  . Armodafinil 150 MG tablet TAKE 1 TABLET BY MOUTH DAILY 90 tablet 0  . cetirizine (ZYRTEC) 10 MG tablet Take 10 mg by mouth daily.    . chlorhexidine (HIBICLENS) 4 % external liquid Apply topically daily as needed. Use 3/week 1000 mL 3  . Cholecalciferol (EQL VITAMIN D3) 1000 UNITS tablet Take 1,000 Units by mouth daily.      . cyclobenzaprine (FLEXERIL) 5 MG tablet TAKE 1 TABLET BY MOUTH THREE TIMES A DAY AS NEEDED FOR MUSCLE SPASMS 60 tablet 2  . doxycycline (VIBRA-TABS) 100 MG tablet Take 1 tablet (100 mg total) by mouth 2 (two) times daily. 20 tablet 3  . fluticasone (FLONASE) 50 MCG/ACT nasal spray SPRAY 2 SPRAYS INTO EACH NOSTRIL EVERY DAY 48 g 11  . FYAVOLV 1-5 MG-MCG TABS tablet Take 1 tablet by mouth daily.    . meloxicam (MOBIC) 15 MG tablet Take 1 tablet (15 mg total) by mouth daily. 30 tablet 3  . metFORMIN (GLUCOPHAGE XR) 500 MG 24 hr tablet Take 1 tablet (500 mg total) by mouth daily with breakfast. 90 tablet 3  . mometasone-formoterol (DULERA) 200-5 MCG/ACT AERO Inhale 2 puffs into the lungs 2 (two) times daily. 13 g 5  . mupirocin ointment (BACTROBAN) 2 % On leg wound w/dressing change qd or bid 30 g 0  . naproxen (NAPROSYN) 500 MG tablet Take 1 tablet (500 mg total) by mouth 2 (two) times daily as needed for moderate pain or headache. Use prn 60 tablet 3  . norethindrone-ethinyl  estradiol (FEMHRT LOW DOSE) 0.5-2.5 MG-MCG tablet Take 1 tablet by mouth daily.    . Olmesartan-amLODIPine-HCTZ (TRIBENZOR) 40-5-12.5 MG TABS Take 1 tablet by mouth 1 day or 1 dose. 90 tablet 3  . ondansetron (ZOFRAN) 4 MG tablet Take 1 tablet (4 mg total) by mouth every 8 (eight) hours as needed for nausea or vomiting. 20 tablet 1  . oxymetazoline (AFRIN NASAL SPRAY) 0.05 % nasal spray Place 1 spray into both nostrils 2 (two) times daily. Use only for 3days, then stop 30 mL 0  . Potassium Chloride ER 20 MEQ TBCR Take 1 tablet by mouth daily.    . potassium chloride SA (KLOR-CON) 20 MEQ tablet Take 1 tablet (20 mEq total) by mouth daily. 90 tablet 3  . Prenatal Vit-Fe Fumarate-FA (PRENATAL VITAMIN PLUS LOW IRON) 27-1 MG TABS TAKE 1 TABLET BY MOUTH EVERY DAY 90 tablet 3  . rizatriptan (MAXALT) 10 MG tablet TAKE 1 TABLET BY MOUTH ONCE AS NEEDED FOR UP TO 1 DOSE FOR MIGRAINE. MAY REPEAT IN 2 HOURS 12 tablet 5   No facility-administered medications prior to visit.    ROS: Review of Systems  Constitutional: Negative for activity change, appetite change, chills, fatigue and unexpected weight change.  HENT: Negative for congestion, mouth sores and sinus pressure.  Eyes: Negative for visual disturbance.  Respiratory: Negative for cough and chest tightness.   Gastrointestinal: Negative for abdominal pain and nausea.  Genitourinary: Negative for difficulty urinating, frequency and vaginal pain.  Musculoskeletal: Positive for back pain and gait problem.  Skin: Negative for pallor and rash.  Neurological: Negative for dizziness, tremors, weakness, numbness and headaches.  Psychiatric/Behavioral: Negative for confusion and sleep disturbance.    Objective:  BP (!) 142/90   Pulse 78   Temp 98.5 F (36.9 C) (Oral)   Ht 5\' 3"  (1.6 m)   Wt 235 lb (106.6 kg)   SpO2 99%   BMI 41.63 kg/m   BP Readings from Last 3 Encounters:  04/25/20 (!) 142/90  03/05/20 118/82  02/16/20 112/88    Wt Readings  from Last 3 Encounters:  04/25/20 235 lb (106.6 kg)  03/05/20 (!) 241 lb (109.3 kg)  02/16/20 237 lb (107.5 kg)    Physical Exam Constitutional:      General: She is not in acute distress.    Appearance: She is well-developed.  HENT:     Head: Normocephalic.     Right Ear: External ear normal.     Left Ear: External ear normal.     Nose: Nose normal.  Eyes:     General:        Right eye: No discharge.        Left eye: No discharge.     Conjunctiva/sclera: Conjunctivae normal.     Pupils: Pupils are equal, round, and reactive to light.  Neck:     Thyroid: No thyromegaly.     Vascular: No JVD.     Trachea: No tracheal deviation.  Cardiovascular:     Rate and Rhythm: Normal rate and regular rhythm.     Heart sounds: Normal heart sounds.  Pulmonary:     Effort: No respiratory distress.     Breath sounds: No stridor. No wheezing.  Abdominal:     General: Bowel sounds are normal. There is no distension.     Palpations: Abdomen is soft. There is no mass.     Tenderness: There is no abdominal tenderness. There is no guarding or rebound.  Musculoskeletal:        General: Tenderness present.     Cervical back: Normal range of motion and neck supple.  Lymphadenopathy:     Cervical: No cervical adenopathy.  Skin:    Findings: No erythema or rash.  Neurological:     Cranial Nerves: No cranial nerve deficit.     Motor: No abnormal muscle tone.     Coordination: Coordination normal.     Deep Tendon Reflexes: Reflexes normal.  Psychiatric:        Behavior: Behavior normal.        Thought Content: Thought content normal.        Judgment: Judgment normal.     Lab Results  Component Value Date   WBC 5.9 11/25/2018   HGB 13.0 11/25/2018   HCT 38.8 11/25/2018   PLT 139.0 (L) 11/25/2018   GLUCOSE 90 06/21/2019   CHOL 180 09/30/2017   TRIG 67.0 09/30/2017   HDL 56.30 09/30/2017   LDLCALC 111 (H) 09/30/2017   ALT 12 11/25/2018   AST 16 11/25/2018   NA 140 06/21/2019   K  3.6 06/21/2019   CL 101 06/21/2019   CREATININE 0.88 06/21/2019   BUN 12 06/21/2019   CO2 31 06/21/2019   TSH 0.69 11/25/2018   INR 0.9 RATIO 03/15/2007  HGBA1C 6.0 11/25/2018    CT ABDOMEN WO CONTRAST  Result Date: 03/15/2019 CLINICAL DATA:  Right adrenal nodule. EXAM: CT ABDOMEN WITHOUT CONTRAST TECHNIQUE: Multidetector CT imaging of the abdomen was performed following the standard protocol without IV contrast. COMPARISON:  10/22/2012 FINDINGS: Lower chest: Adjacent 2-3 mm pulmonary nodules in the left lower lobe appear new since prior study (image 5/series 3) Hepatobiliary: The liver shows diffusely decreased attenuation suggesting fat deposition. There is no evidence for gallstones, gallbladder wall thickening, or pericholecystic fluid. No intrahepatic or extrahepatic biliary dilation. Pancreas: No focal mass lesion. No dilatation of the main duct. No intraparenchymal cyst. No peripancreatic edema. Spleen: No splenomegaly. No focal mass lesion. Adrenals/Urinary Tract: Left adrenal gland unremarkable. Small right adrenal nodules are similar to prior. The more posterior of the 2 nodules measures 15 mm today which is unchanged when I remeasure on the prior study. The more anterior of the 2 nodules measures 2.2 cm which is also stable when I remeasure it on the prior exam. This nodule has an average attenuation of 7.9 Hounsfield units, consistent with adenoma. Kidneys unremarkable. Stomach/Bowel: Tiny hiatal hernia. Stomach is unremarkable. No gastric wall thickening. No evidence of outlet obstruction. Duodenum is normally positioned as is the ligament of Treitz. Visualized small bowel loops and colonic segments of the abdomen are unremarkable. Vascular/Lymphatic: No abdominal aortic aneurysm. Other: No intraperitoneal free fluid. Musculoskeletal: Small umbilical hernia contains a short segment of small bowel without complicating features. No worrisome lytic or sclerotic osseous abnormality. IMPRESSION:  1. 2 right adrenal nodules are stable in the 6 year interval since prior study consistent with benign etiology. The more anterior of these 2 nodules has low attenuation consistent with adenoma. 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 2. Small umbilical hernia contains a short segment of small bowel without complicating features. 3. 2 adjacent 2-3 mm pulmonary nodules noted in the left lower lobe. No follow-up needed if patient is low-risk (and has no known or suspected primary neoplasm). Non-contrast chest CT can be considered in 12 months if patient is high-risk. This recommendation follows the consensus statement: Guidelines for Management of Incidental Pulmonary Nodules Detected on CT Images: From the Fleischner Society 2017; Radiology 2017; 284:228-243. Electronically Signed   By: Misty Stanley M.D.   On: 03/15/2019 15:31    Assessment & Plan:     Walker Kehr, MD

## 2020-04-25 NOTE — Assessment & Plan Note (Signed)
Better on diet 

## 2020-04-25 NOTE — Assessment & Plan Note (Signed)
Plastic surg ref

## 2020-04-25 NOTE — Assessment & Plan Note (Signed)
Worse R sciatica MRI LS Large breasts discussed - breast reduction should help LBP

## 2020-04-25 NOTE — Assessment & Plan Note (Signed)
Vit D 

## 2020-05-13 ENCOUNTER — Ambulatory Visit
Admission: RE | Admit: 2020-05-13 | Discharge: 2020-05-13 | Disposition: A | Discharge status: Home / Self Care | Payer: 59 | Source: Ambulatory Visit | Attending: Internal Medicine | Admitting: Internal Medicine

## 2020-05-13 DIAGNOSIS — G8929 Other chronic pain: Secondary | ICD-10-CM

## 2020-05-13 DIAGNOSIS — M5416 Radiculopathy, lumbar region: Secondary | ICD-10-CM

## 2020-05-22 ENCOUNTER — Other Ambulatory Visit: Payer: Self-pay | Admitting: Internal Medicine

## 2020-05-22 DIAGNOSIS — G8929 Other chronic pain: Secondary | ICD-10-CM

## 2020-05-28 ENCOUNTER — Telehealth: Payer: Self-pay | Admitting: Internal Medicine

## 2020-05-28 DIAGNOSIS — G8929 Other chronic pain: Secondary | ICD-10-CM

## 2020-05-28 NOTE — Telephone Encounter (Signed)
Patient has more questions re: MRI and My Chart message and would like to speak to Dr. Alain Marion

## 2020-06-01 MED ORDER — PREDNISONE 10 MG PO TABS: ORAL_TABLET | ORAL | 1 refills | Status: DC

## 2020-06-01 NOTE — Telephone Encounter (Signed)
I called, left a VM

## 2020-06-01 NOTE — Telephone Encounter (Signed)
Spoke w/Laurenashley Prednisone Rx - hold Naproxen PT Keep Neuro appt in 1 mo  Pt is aware  Thx

## 2020-06-05 ENCOUNTER — Other Ambulatory Visit: Payer: Self-pay | Admitting: Internal Medicine

## 2020-06-05 DIAGNOSIS — H6501 Acute serous otitis media, right ear: Secondary | ICD-10-CM

## 2020-06-05 DIAGNOSIS — J069 Acute upper respiratory infection, unspecified: Secondary | ICD-10-CM

## 2020-06-08 ENCOUNTER — Other Ambulatory Visit: Payer: Self-pay | Admitting: Internal Medicine

## 2020-06-08 DIAGNOSIS — J069 Acute upper respiratory infection, unspecified: Secondary | ICD-10-CM

## 2020-06-08 DIAGNOSIS — H6501 Acute serous otitis media, right ear: Secondary | ICD-10-CM

## 2020-07-02 ENCOUNTER — Other Ambulatory Visit: Payer: Self-pay | Admitting: Internal Medicine

## 2020-07-17 ENCOUNTER — Other Ambulatory Visit: Payer: Self-pay | Admitting: Internal Medicine

## 2020-08-22 ENCOUNTER — Telehealth: Payer: Self-pay | Admitting: Internal Medicine

## 2020-08-22 DIAGNOSIS — M545 Low back pain, unspecified: Secondary | ICD-10-CM

## 2020-08-22 DIAGNOSIS — N62 Hypertrophy of breast: Secondary | ICD-10-CM

## 2020-08-22 DIAGNOSIS — G8929 Other chronic pain: Secondary | ICD-10-CM

## 2020-08-22 NOTE — Telephone Encounter (Signed)
Patient called and was wondering if another referral could be placed to [facility] for a breast reduction.

## 2020-08-22 NOTE — Telephone Encounter (Signed)
OK. Thx

## 2020-09-06 ENCOUNTER — Other Ambulatory Visit: Payer: Self-pay | Admitting: Internal Medicine

## 2020-09-09 ENCOUNTER — Other Ambulatory Visit: Payer: Self-pay | Admitting: Internal Medicine

## 2020-10-03 ENCOUNTER — Other Ambulatory Visit: Payer: Self-pay | Admitting: Internal Medicine

## 2020-10-05 DIAGNOSIS — R7303 Prediabetes: Secondary | ICD-10-CM | POA: Insufficient documentation

## 2020-10-11 ENCOUNTER — Telehealth: Payer: Self-pay | Admitting: Internal Medicine

## 2020-10-12 ENCOUNTER — Other Ambulatory Visit: Payer: Self-pay | Admitting: Internal Medicine

## 2020-10-17 ENCOUNTER — Ambulatory Visit: Payer: 59 | Attending: Internal Medicine

## 2020-10-17 ENCOUNTER — Other Ambulatory Visit (HOSPITAL_COMMUNITY): Payer: Self-pay | Admitting: Internal Medicine

## 2020-10-17 DIAGNOSIS — Z23 Encounter for immunization: Secondary | ICD-10-CM

## 2020-10-17 NOTE — Progress Notes (Signed)
   Covid-19 Vaccination Clinic  Name:  [name]    MRN: [id number] DOB: 1971/08/01  10/17/2020  [name] was observed post Covid-19 immunization for 15 minutes without incident. She was provided with Vaccine Information Sheet and instruction to access the V-Safe system.   [name] was instructed to call 911 with any severe reactions post vaccine: Marland Kitchen Difficulty breathing  . Swelling of face and throat  . A fast heartbeat  . A bad rash all over body  . Dizziness and weakness   Immunizations Administered    Name Date Dose VIS Date Route   PFIZER Comrnaty(Gray TOP) Covid-19 Vaccine 10/17/2020 12:53 PM 0.3 mL 07/12/2020 Intramuscular   Manufacturer: [place]   Lot: [id number]   NDC: [phone]

## 2020-10-31 ENCOUNTER — Ambulatory Visit: Payer: 59 | Admitting: Internal Medicine

## 2020-11-05 DIAGNOSIS — Z0279 Encounter for issue of other medical certificate: Secondary | ICD-10-CM

## 2020-11-06 ENCOUNTER — Encounter: Payer: Self-pay | Admitting: Internal Medicine

## 2020-11-06 ENCOUNTER — Other Ambulatory Visit: Payer: Self-pay

## 2020-11-06 ENCOUNTER — Ambulatory Visit: Payer: 59 | Admitting: Internal Medicine

## 2020-11-06 DIAGNOSIS — G8929 Other chronic pain: Secondary | ICD-10-CM

## 2020-11-06 DIAGNOSIS — M545 Low back pain, unspecified: Secondary | ICD-10-CM | POA: Diagnosis not present

## 2020-11-06 DIAGNOSIS — I10 Essential (primary) hypertension: Secondary | ICD-10-CM | POA: Diagnosis not present

## 2020-11-06 DIAGNOSIS — E559 Vitamin D deficiency, unspecified: Secondary | ICD-10-CM

## 2020-11-06 DIAGNOSIS — M5416 Radiculopathy, lumbar region: Secondary | ICD-10-CM | POA: Diagnosis not present

## 2020-11-06 MED ORDER — MUPIROCIN 2 % EX OINT
TOPICAL_OINTMENT | CUTANEOUS | 0 refills | Status: DC
Start: 2020-11-06 — End: 2021-01-02

## 2020-11-06 NOTE — Progress Notes (Signed)
Subjective:  Patient ID: [name], female    DOB: 10-23-1970  Age: 50 y.o. MRN: [id number]  CC: Back Pain   HPI [name] presents for LBP - worse, obesity, HTN On Phentermine 15 mg bid per [facility]; pt lost 9 lbs  Outpatient Medications Prior to Visit  Medication Sig Dispense Refill  . ALPRAZolam (XANAX) 0.5 MG tablet TAKE 1 TO 2 TABLETS BY MOUTH EVERY DAY AT BEDTIME 180 tablet 0  . Armodafinil 150 MG tablet TAKE 1 TABLET BY MOUTH DAILY 90 tablet 0  . cetirizine (ZYRTEC) 10 MG tablet Take 10 mg by mouth daily.    . chlorhexidine (HIBICLENS) 4 % external liquid Apply topically daily as needed. Use 3/week 1000 mL 3  . Cholecalciferol 25 MCG (1000 UT) tablet Take 1,000 Units by mouth daily.    . cyclobenzaprine (FLEXERIL) 5 MG tablet TAKE 1 TABLET BY MOUTH THREE TIMES A DAY AS NEEDED FOR MUSCLE SPASMS 60 tablet 2  . doxycycline (VIBRA-TABS) 100 MG tablet Take 1 tablet (100 mg total) by mouth 2 (two) times daily. 20 tablet 3  . fluticasone (FLONASE) 50 MCG/ACT nasal spray SPRAY 2 SPRAYS INTO EACH NOSTRIL EVERY DAY 48 mL 0  . FYAVOLV 1-5 MG-MCG TABS tablet Take 1 tablet by mouth daily.    . meloxicam (MOBIC) 15 MG tablet Take 1 tablet (15 mg total) by mouth daily. 30 tablet 3  . mometasone-formoterol (DULERA) 200-5 MCG/ACT AERO Inhale 2 puffs into the lungs 2 (two) times daily. 13 g 5  . mupirocin ointment (BACTROBAN) 2 % On leg wound w/dressing change qd or bid 30 g 0  . naproxen (NAPROSYN) 500 MG tablet Take 1 tablet (500 mg total) by mouth 2 (two) times daily as needed for moderate pain or headache. Use prn 60 tablet 3  . norethindrone-ethinyl estradiol (FEMHRT LOW DOSE) 0.5-2.5 MG-MCG tablet Take 1 tablet by mouth daily.    . Olmesartan-amLODIPine-HCTZ 40-5-12.5 MG TABS TAKE 1 TABLET BY MOUTH ONCE DAILY 90 tablet 3  . ondansetron (ZOFRAN) 4 MG tablet Take 1 tablet (4 mg total) by mouth every 8 (eight) hours as needed for nausea or vomiting. 20 tablet 1   . oxymetazoline (AFRIN NASAL SPRAY) 0.05 % nasal spray Place 1 spray into both nostrils 2 (two) times daily. Use only for 3days, then stop 30 mL 0  . Potassium Chloride ER 20 MEQ TBCR Take 1 tablet by mouth daily. Annual appt due in April must see provider for future refills 90 tablet 0  . Prenatal 27-1 MG TABS TAKE 1 TABLET BY MOUTH EVERY DAY 90 tablet 3  . rizatriptan (MAXALT) 10 MG tablet TAKE 1 TABLET BY MOUTH ONCE AS NEEDED FOR UP TO 1 DOSE FOR MIGRAINE. MAY REPEAT IN 2 HOURS 12 tablet 5  . Acetaminophen-Codeine (TYLENOL/CODEINE #3) 300-30 MG tablet Take 0.5-1 tablets by mouth every 6 (six) hours as needed for pain. (Patient not taking: Reported on 11/06/2020) 12 tablet 0  . COVID-19 mRNA Vac-TriS, Pfizer, SUSP injection USE AS DIRECTED (Patient not taking: Reported on 11/06/2020) .3 mL 0  . metFORMIN (GLUCOPHAGE XR) 500 MG 24 hr tablet Take 1 tablet (500 mg total) by mouth daily with breakfast. (Patient not taking: Reported on 11/06/2020) 90 tablet 3  . Potassium Chloride ER 20 MEQ TBCR Take 1 tablet by mouth daily.    . predniSONE (DELTASONE) 10 MG tablet Prednisone 10 mg: take 4 tabs a day x 3 days; then 3 tabs a day x 4 days;  then 2 tabs a day x 4 days, then 1 tab a day x 6 days, then stop. Take pc. (Patient not taking: Reported on 11/06/2020) 38 tablet 1   No facility-administered medications prior to visit.    ROS: Review of Systems  Constitutional: Negative for activity change, appetite change, chills, fatigue and unexpected weight change.  HENT: Negative for congestion, mouth sores and sinus pressure.   Eyes: Negative for visual disturbance.  Respiratory: Negative for cough and chest tightness.   Gastrointestinal: Negative for abdominal pain and nausea.  Genitourinary: Negative for difficulty urinating, frequency and vaginal pain.  Musculoskeletal: Positive for back pain. Negative for gait problem.  Skin: Negative for pallor and rash.  Neurological: Negative for dizziness, tremors,  weakness, numbness and headaches.  Psychiatric/Behavioral: Negative for confusion and sleep disturbance.    Objective:  BP 140/90 (BP Location: Left Arm)   Pulse 82   Temp 98.3 F (36.8 C) (Oral)   Wt 225 lb 6.4 oz (102.2 kg)   SpO2 98%   BMI 39.93 kg/m   BP Readings from Last 3 Encounters:  11/06/20 140/90  04/25/20 (!) 142/90  03/05/20 118/82    Wt Readings from Last 3 Encounters:  11/06/20 225 lb 6.4 oz (102.2 kg)  04/25/20 235 lb (106.6 kg)  03/05/20 (!) 241 lb (109.3 kg)    Physical Exam Constitutional:      General: She is not in acute distress.    Appearance: She is well-developed. She is obese.  HENT:     Head: Normocephalic.     Right Ear: External ear normal.     Left Ear: External ear normal.     Nose: Nose normal.  Eyes:     General:        Right eye: No discharge.        Left eye: No discharge.     Conjunctiva/sclera: Conjunctivae normal.     Pupils: Pupils are equal, round, and reactive to light.  Neck:     Thyroid: No thyromegaly.     Vascular: No JVD.     Trachea: No tracheal deviation.  Cardiovascular:     Rate and Rhythm: Normal rate and regular rhythm.     Heart sounds: Normal heart sounds.  Pulmonary:     Effort: No respiratory distress.     Breath sounds: No stridor. No wheezing.  Abdominal:     General: Bowel sounds are normal. There is no distension.     Palpations: Abdomen is soft. There is no mass.     Tenderness: There is no abdominal tenderness. There is no guarding or rebound.  Musculoskeletal:        General: Tenderness present.     Cervical back: Normal range of motion and neck supple.  Lymphadenopathy:     Cervical: No cervical adenopathy.  Skin:    Findings: No erythema or rash.  Neurological:     Cranial Nerves: No cranial nerve deficit.     Motor: No abnormal muscle tone.     Coordination: Coordination normal.     Deep Tendon Reflexes: Reflexes normal.  Psychiatric:        Behavior: Behavior normal.        Thought  Content: Thought content normal.        Judgment: Judgment normal.    LS w/pain  FMLA filled out   Lab Results  Component Value Date   WBC 5.9 11/25/2018   HGB 13.0 11/25/2018   HCT 38.8 11/25/2018   PLT 139.0 (  L) 11/25/2018   GLUCOSE 90 06/21/2019   CHOL 180 09/30/2017   TRIG 67.0 09/30/2017   HDL 56.30 09/30/2017   LDLCALC 111 (H) 09/30/2017   ALT 12 11/25/2018   AST 16 11/25/2018   NA 140 06/21/2019   K 3.6 06/21/2019   CL 101 06/21/2019   CREATININE 0.88 06/21/2019   BUN 12 06/21/2019   CO2 31 06/21/2019   TSH 0.69 11/25/2018   INR 0.9 RATIO 03/15/2007   HGBA1C 6.0 11/25/2018    MR Lumbar Spine Wo Contrast  Result Date: 05/14/2020 CLINICAL DATA:  Initial evaluation for lower back pain with right-sided radiculopathy. EXAM: MRI LUMBAR SPINE WITHOUT CONTRAST TECHNIQUE: Multiplanar, multisequence MR imaging of the lumbar spine was performed. No intravenous contrast was administered. COMPARISON:  Comparison made with prior radiograph from 03/05/2020 as well as prior MRI from 03/07/2010. FINDINGS: Segmentation: Transitional lumbosacral anatomy. For the purposes of this dictation, the lowest well-formed disc space is labeled L5-S1, with a vestigial S1-2 interspace. Alignment: Trace retrolisthesis of L1 on L2 and L2 on L3, with trace anterolisthesis of L4 on L5 and L5 on S1. Vertebrae: Vertebral body height maintained without acute or chronic fracture. Bone marrow signal intensity within normal limits. Few scattered benign hemangiomata noted. No worrisome osseous lesions. Reactive marrow edema seen about the L5-S1 facets bilaterally due to facet arthritis. No other abnormal marrow edema. Conus medullaris and cauda equina: Conus extends to the L2-3 level. Conus and cauda equina appear normal. Paraspinal and other soft tissues: Paraspinous soft tissues within normal limits. 1.9 cm simple cyst noted within the interpolar left kidney. Enlarged fibroid uterus partially visualized. Disc  levels: T11-12 and T12-L1: Unremarkable. L1-2: Trace retrolisthesis. Mild diffuse disc bulge with disc desiccation and intervertebral disc space narrowing. Mild reactive endplate change. Mild facet hypertrophy. No significant spinal stenosis. Mild right L1 foraminal stenosis. No significant left foraminal narrowing. L2-3: Disc desiccation with mild disc bulge. Superimposed small left subarticular to foraminal disc protrusion (series 10, image 19). Mild flattening of the left ventral thecal sac with resultant mild left lateral recess narrowing. Central canal remains widely patent. Mild left L2 foraminal stenosis. No significant right foraminal narrowing. L3-4: Negative interspace. Mild bilateral facet hypertrophy. No canal or foraminal stenosis. L4-5: Negative interspace. Moderate bilateral facet hypertrophy. No spinal stenosis. Foramina remain patent. L5-S1: Broad-based posterior disc bulge closely approximates the exiting L5 nerve roots bilaterally (series 13, image 36). Superimposed severe bilateral facet degeneration, left slightly worse than right. Trace joint effusion on the left. Associated reactive marrow edema. No significant spinal stenosis. Mild right with mild-to-moderate left L5 foraminal narrowing. IMPRESSION: 1. 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 Superimposed broad posterior disc bulge at this level with resultant mild to moderate left worse than right L5 foraminal stenosis. 2. Disc bulge with small left subarticular to foraminal disc protrusion at L2-3 with resultant mild left foraminal and left lateral recess stenosis. 3. Progressive degenerative disc disease and disc bulge at L1-2 with resultant mild right L1 foraminal stenosis. 4. Transitional lumbosacral anatomy. Careful correlation with numbering system on this exam recommended prior to any potential future intervention. 5. Enlarged fibroid uterus.  Electronically Signed   By: Jeannine Boga M.D.   On: 05/14/2020 04:15    Assessment & Plan:    Walker Kehr, MD

## 2020-11-06 NOTE — Assessment & Plan Note (Signed)
Worse: FMLA filled out - 4 d per 1 mo Wt loss - On Phentermine 15 mg bid per [facility]; pt lost 9 lbs Breast reduction surgery - is being approved

## 2020-11-06 NOTE — Assessment & Plan Note (Signed)
BP Readings from Last 3 Encounters:  11/06/20 140/90  04/25/20 (!) 142/90  03/05/20 118/82

## 2020-11-06 NOTE — Assessment & Plan Note (Signed)
  Wt loss - On Phentermine 15 mg bid per [facility]; pt lost 9 lbs Breast reduction surgery - is being approved

## 2020-11-06 NOTE — Assessment & Plan Note (Signed)
On Vit D 

## 2020-11-06 NOTE — Assessment & Plan Note (Addendum)
Worse: FMLA filled out - 4 d per 1 mo Wt loss - On Phentermine 15 mg bid per [facility]; pt lost 9 lbs Breast reduction surgery - is being approved Worse: FMLA filled out - 4 d per 1 mo Wt loss - On Phentermine 15 mg bid per [facility]; pt lost 9 lbs Breast reduction surgery - is being approved Try CBD

## 2020-11-14 ENCOUNTER — Other Ambulatory Visit: Payer: Self-pay | Admitting: Internal Medicine

## 2020-12-10 ENCOUNTER — Other Ambulatory Visit: Payer: Self-pay | Admitting: Internal Medicine

## 2020-12-25 ENCOUNTER — Encounter: Payer: Self-pay | Admitting: Internal Medicine

## 2020-12-25 ENCOUNTER — Telehealth (INDEPENDENT_AMBULATORY_CARE_PROVIDER_SITE_OTHER): Payer: 59 | Admitting: Internal Medicine

## 2020-12-25 DIAGNOSIS — M5416 Radiculopathy, lumbar region: Secondary | ICD-10-CM

## 2020-12-25 DIAGNOSIS — M797 Fibromyalgia: Secondary | ICD-10-CM

## 2020-12-25 DIAGNOSIS — R5382 Chronic fatigue, unspecified: Secondary | ICD-10-CM | POA: Diagnosis not present

## 2020-12-25 MED ORDER — METHYLPREDNISOLONE 4 MG PO TBPK: ORAL_TABLET | ORAL | 0 refills | Status: DC

## 2020-12-25 MED ORDER — HYDROCODONE-ACETAMINOPHEN 5-325 MG PO TABS: 1.0000 | ORAL_TABLET | Freq: Four times a day (QID) | ORAL | 0 refills | Status: DC | PRN

## 2020-12-25 NOTE — Progress Notes (Signed)
Virtual Visit via Video Note  I connected with [name] on 12/25/20 at  2:40 PM EDT by a video enabled telemedicine application and verified that I am speaking with the correct person using two identifiers.   I discussed the limitations of evaluation and management by telemedicine and the availability of in person appointments. The patient expressed understanding and agreed to proceed.  I was located at  home office. The patient was at work. There was no one else present in the visit.   History of Present Illness:   [name] is complaining of severe low back pain and radiating down to legs.  Her fibromyalgia symptoms (achiness, myalgia, arthralgias) are worse too.  She has been taking tramadol, ibuprofen, Aleve with no relief.  She has been feeling tired.  Pain in the left leg is worse than in the right leg.  She has been taking Xanax at night along with muscle relaxer. Observations/Objective: The patient appears to be in no acute distress, looks ok  Assessment and Plan:  See my Assessment and Plan. Follow Up Instructions:    I discussed the assessment and treatment plan with the patient. The patient was provided an opportunity to ask questions and all were answered. The patient agreed with the plan and demonstrated an understanding of the instructions.   The patient was advised to call back or seek an in-person evaluation if the symptoms worsen or if the condition fails to improve as anticipated.  I provided face-to-face time during this encounter. We were at different locations.   Walker Kehr, MD

## 2020-12-25 NOTE — Assessment & Plan Note (Addendum)
Exacerbation - Worsening pain Prescribed Norco as needed.  She is not taking tramadol  Potential benefits of a short term opioids use as well as potential risks (i.e. addiction risk, apnea etc) and complications (i.e. Somnolence, constipation and others) were explained to the patient and were aknowledged.  Prescribed Medrol pack RTC 1 wk

## 2020-12-25 NOTE — Assessment & Plan Note (Signed)
Worse.  Treat pain.  Return to clinic next week.  Will likely need to obtain blood work

## 2020-12-25 NOTE — Assessment & Plan Note (Signed)
Worse.  Continue with the muscle relaxer at night.

## 2021-01-02 ENCOUNTER — Other Ambulatory Visit: Payer: Self-pay

## 2021-01-02 ENCOUNTER — Ambulatory Visit: Payer: 59 | Admitting: Internal Medicine

## 2021-01-02 ENCOUNTER — Encounter: Payer: Self-pay | Admitting: Internal Medicine

## 2021-01-02 DIAGNOSIS — M545 Low back pain, unspecified: Secondary | ICD-10-CM | POA: Diagnosis not present

## 2021-01-02 DIAGNOSIS — E559 Vitamin D deficiency, unspecified: Secondary | ICD-10-CM | POA: Diagnosis not present

## 2021-01-02 DIAGNOSIS — I10 Essential (primary) hypertension: Secondary | ICD-10-CM | POA: Diagnosis not present

## 2021-01-02 DIAGNOSIS — N62 Hypertrophy of breast: Secondary | ICD-10-CM

## 2021-01-02 DIAGNOSIS — G8929 Other chronic pain: Secondary | ICD-10-CM

## 2021-01-02 DIAGNOSIS — E538 Deficiency of other specified B group vitamins: Secondary | ICD-10-CM

## 2021-01-02 MED ORDER — HYDROCODONE-ACETAMINOPHEN 5-325 MG PO TABS
1.0000 | ORAL_TABLET | Freq: Four times a day (QID) | ORAL | 0 refills | Status: DC | PRN
Start: 2021-01-02 — End: 2021-06-05

## 2021-01-02 NOTE — Assessment & Plan Note (Signed)
No change Cont w/Tribenzor

## 2021-01-02 NOTE — Assessment & Plan Note (Signed)
R sciatica is better after steroids po

## 2021-01-02 NOTE — Assessment & Plan Note (Signed)
Cervical pain, LBP, R sciatica are aggravated by large breasts Plastic surgery appt is pending

## 2021-01-02 NOTE — Progress Notes (Signed)
Subjective:  Patient ID: [name], female    DOB: 11-11-1970  Age: 50 y.o. MRN: [id number]  CC: Back Pain (Pt states the pain starts in her shoulder.. more on the (R) side goes down to her buttock, into her legs and bottom of her feet)   HPI Quanna Wittke presents for LBP and R radiculopathy - better on Prednisone C/o large breasts - neck pain and low back pain is related  Outpatient Medications Prior to Visit  Medication Sig Dispense Refill  . ALPRAZolam (XANAX) 0.5 MG tablet TAKE 1 TO 2 TABLETS BY MOUTH EVERY DAY AT BEDTIME 180 tablet 0  . Armodafinil 150 MG tablet TAKE 1 TABLET BY MOUTH DAILY 90 tablet 0  . cetirizine (ZYRTEC) 10 MG tablet Take 10 mg by mouth daily.    . chlorhexidine (HIBICLENS) 4 % external liquid Apply topically daily as needed. Use 3/week 1000 mL 3  . Cholecalciferol 25 MCG (1000 UT) tablet Take 1,000 Units by mouth daily.    . cyclobenzaprine (FLEXERIL) 5 MG tablet TAKE 1 TABLET BY MOUTH THREE TIMES A DAY AS NEEDED FOR MUSCLE SPASMS 60 tablet 2  . fluticasone (FLONASE) 50 MCG/ACT nasal spray SPRAY 2 SPRAYS INTO EACH NOSTRIL EVERY DAY 48 mL 0  . FYAVOLV 1-5 MG-MCG TABS tablet Take 1 tablet by mouth daily.    Marland Kitchen HYDROcodone-acetaminophen (NORCO/VICODIN) 5-325 MG tablet Take 1 tablet by mouth every 6 (six) hours as needed for severe pain. 20 tablet 0  . meloxicam (MOBIC) 15 MG tablet Take 1 tablet (15 mg total) by mouth daily. 30 tablet 3  . mometasone-formoterol (DULERA) 200-5 MCG/ACT AERO Inhale 2 puffs into the lungs 2 (two) times daily. 13 g 5  . naproxen (NAPROSYN) 500 MG tablet TAKE 1 TABLET (500 MG TOTAL) BY MOUTH 2 (TWO) TIMES DAILY AS NEEDED FOR MODERATE PAIN OR HEADACHE. USE AS NEEDED 60 tablet 3  . norethindrone-ethinyl estradiol (FEMHRT LOW DOSE) 0.5-2.5 MG-MCG tablet Take 1 tablet by mouth daily.    . Olmesartan-amLODIPine-HCTZ 40-5-12.5 MG TABS TAKE 1 TABLET BY MOUTH ONCE DAILY 90 tablet 3  . oxymetazoline (AFRIN NASAL SPRAY) 0.05 % nasal spray  Place 1 spray into both nostrils 2 (two) times daily. Use only for 3days, then stop 30 mL 0  . Potassium Chloride ER 20 MEQ TBCR Take 1 tablet by mouth daily. 90 tablet 3  . Prenatal 27-1 MG TABS TAKE 1 TABLET BY MOUTH EVERY DAY 90 tablet 3  . rizatriptan (MAXALT) 10 MG tablet TAKE 1 TABLET BY MOUTH ONCE AS NEEDED FOR UP TO 1 DOSE FOR MIGRAINE. MAY REPEAT IN 2 HOURS 12 tablet 5  . doxycycline (VIBRA-TABS) 100 MG tablet Take 1 tablet (100 mg total) by mouth 2 (two) times daily. 20 tablet 3  . methylPREDNISolone (MEDROL DOSEPAK) 4 MG TBPK tablet As directed 21 tablet 0  . mupirocin ointment (BACTROBAN) 2 % On leg wound w/dressing change qd or bid 30 g 0  . ondansetron (ZOFRAN) 4 MG tablet Take 1 tablet (4 mg total) by mouth every 8 (eight) hours as needed for nausea or vomiting. 20 tablet 1   No facility-administered medications prior to visit.    ROS: Review of Systems  Constitutional: Positive for fatigue. Negative for activity change, appetite change, chills and unexpected weight change.  HENT: Negative for congestion, mouth sores and sinus pressure.   Eyes: Negative for visual disturbance.  Respiratory: Negative for cough and chest tightness.   Gastrointestinal: Negative for abdominal pain and nausea.  Genitourinary: Negative for difficulty urinating, frequency and vaginal pain.  Musculoskeletal: Positive for back pain, neck pain and neck stiffness. Negative for gait problem.  Skin: Negative for pallor and rash.  Neurological: Negative for dizziness, tremors, weakness, numbness and headaches.  Psychiatric/Behavioral: Negative for confusion and sleep disturbance.    Objective:  BP 120/88 (BP Location: Left Arm)   Pulse 84   Temp 98.3 F (36.8 C) (Oral)   Wt 221 lb (100.2 kg)   SpO2 99%   BMI 39.15 kg/m   BP Readings from Last 3 Encounters:  01/02/21 120/88  11/06/20 140/90  04/25/20 (!) 142/90    Wt Readings from Last 3 Encounters:  01/02/21 221 lb (100.2 kg)  11/06/20  225 lb 6.4 oz (102.2 kg)  04/25/20 235 lb (106.6 kg)    Physical Exam Constitutional:      General: She is not in acute distress.    Appearance: She is well-developed. She is obese.  HENT:     Head: Normocephalic.     Right Ear: External ear normal.     Left Ear: External ear normal.     Nose: Nose normal.  Eyes:     General:        Right eye: No discharge.        Left eye: No discharge.     Conjunctiva/sclera: Conjunctivae normal.     Pupils: Pupils are equal, round, and reactive to light.  Neck:     Thyroid: No thyromegaly.     Vascular: No JVD.     Trachea: No tracheal deviation.  Cardiovascular:     Rate and Rhythm: Normal rate and regular rhythm.     Heart sounds: Normal heart sounds.  Pulmonary:     Effort: No respiratory distress.     Breath sounds: No stridor. No wheezing.  Abdominal:     General: Bowel sounds are normal. There is no distension.     Palpations: Abdomen is soft. There is no mass.     Tenderness: There is no abdominal tenderness. There is no guarding or rebound.  Musculoskeletal:        General: Tenderness present.     Cervical back: Normal range of motion and neck supple.  Lymphadenopathy:     Cervical: No cervical adenopathy.  Skin:    Findings: No erythema or rash.  Neurological:     Mental Status: She is oriented to person, place, and time.     Cranial Nerves: No cranial nerve deficit.     Motor: No abnormal muscle tone.     Coordination: Coordination normal.     Gait: Gait abnormal.     Deep Tendon Reflexes: Reflexes normal.  Psychiatric:        Behavior: Behavior normal.        Thought Content: Thought content normal.        Judgment: Judgment normal.    Large breasts LS, C spine w/pain  Str leg elevation is (-) B  Obese   Lab Results  Component Value Date   WBC 5.9 11/25/2018   HGB 13.0 11/25/2018   HCT 38.8 11/25/2018   PLT 139.0 (L) 11/25/2018   GLUCOSE 90 06/21/2019   CHOL 180 09/30/2017   TRIG 67.0 09/30/2017    HDL 56.30 09/30/2017   LDLCALC 111 (H) 09/30/2017   ALT 12 11/25/2018   AST 16 11/25/2018   NA 140 06/21/2019   K 3.6 06/21/2019   CL 101 06/21/2019   CREATININE 0.88 06/21/2019   BUN  12 06/21/2019   CO2 31 06/21/2019   TSH 0.69 11/25/2018   INR 0.9 RATIO 03/15/2007   HGBA1C 6.0 11/25/2018    MR Lumbar Spine Wo Contrast  Result Date: 05/14/2020 CLINICAL DATA:  Initial evaluation for lower back pain with right-sided radiculopathy. EXAM: MRI LUMBAR SPINE WITHOUT CONTRAST TECHNIQUE: Multiplanar, multisequence MR imaging of the lumbar spine was performed. No intravenous contrast was administered. COMPARISON:  Comparison made with prior radiograph from 03/05/2020 as well as prior MRI from 03/07/2010. FINDINGS: Segmentation: Transitional lumbosacral anatomy. For the purposes of this dictation, the lowest well-formed disc space is labeled L5-S1, with a vestigial S1-2 interspace. Alignment: Trace retrolisthesis of L1 on L2 and L2 on L3, with trace anterolisthesis of L4 on L5 and L5 on S1. Vertebrae: Vertebral body height maintained without acute or chronic fracture. Bone marrow signal intensity within normal limits. Few scattered benign hemangiomata noted. No worrisome osseous lesions. Reactive marrow edema seen about the L5-S1 facets bilaterally due to facet arthritis. No other abnormal marrow edema. Conus medullaris and cauda equina: Conus extends to the L2-3 level. Conus and cauda equina appear normal. Paraspinal and other soft tissues: Paraspinous soft tissues within normal limits. 1.9 cm simple cyst noted within the interpolar left kidney. Enlarged fibroid uterus partially visualized. Disc levels: T11-12 and T12-L1: Unremarkable. L1-2: Trace retrolisthesis. Mild diffuse disc bulge with disc desiccation and intervertebral disc space narrowing. Mild reactive endplate change. Mild facet hypertrophy. No significant spinal stenosis. Mild right L1 foraminal stenosis. No significant left foraminal narrowing.  L2-3: Disc desiccation with mild disc bulge. Superimposed small left subarticular to foraminal disc protrusion (series 10, image 19). Mild flattening of the left ventral thecal sac with resultant mild left lateral recess narrowing. Central canal remains widely patent. Mild left L2 foraminal stenosis. No significant right foraminal narrowing. L3-4: Negative interspace. Mild bilateral facet hypertrophy. No canal or foraminal stenosis. L4-5: Negative interspace. Moderate bilateral facet hypertrophy. No spinal stenosis. Foramina remain patent. L5-S1: Broad-based posterior disc bulge closely approximates the exiting L5 nerve roots bilaterally (series 13, image 36). Superimposed severe bilateral facet degeneration, left slightly worse than right. Trace joint effusion on the left. Associated reactive marrow edema. No significant spinal stenosis. Mild right with mild-to-moderate left L5 foraminal narrowing. IMPRESSION: 1. 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 Superimposed broad posterior disc bulge at this level with resultant mild to moderate left worse than right L5 foraminal stenosis. 2. Disc bulge with small left subarticular to foraminal disc protrusion at L2-3 with resultant mild left foraminal and left lateral recess stenosis. 3. Progressive degenerative disc disease and disc bulge at L1-2 with resultant mild right L1 foraminal stenosis. 4. Transitional lumbosacral anatomy. Careful correlation with numbering system on this exam recommended prior to any potential future intervention. 5. Enlarged fibroid uterus. Electronically Signed   By: Jeannine Boga M.D.   On: 05/14/2020 04:15    Assessment & Plan:   There are no diagnoses linked to this encounter.   No orders of the defined types were placed in this encounter.    Follow-up: No follow-ups on file.  Walker Kehr, MD

## 2021-01-02 NOTE — Assessment & Plan Note (Signed)
On diet - pt lost a few pounds

## 2021-01-02 NOTE — Assessment & Plan Note (Signed)
Cont w/Vit B12 

## 2021-01-02 NOTE — Assessment & Plan Note (Signed)
Stable Cont w/Vit D

## 2021-01-15 ENCOUNTER — Other Ambulatory Visit: Payer: Self-pay | Admitting: Internal Medicine

## 2021-01-15 NOTE — Telephone Encounter (Signed)
1.Medication Requested: ALPRAZolam (XANAX) 0.5 MG tablet   2. Pharmacy (Name, [address], [place]): [id number] - [place], [place]   3. On Med List: yes   4. Last Visit with PCP: 01-02-21  5. Next visit date with PCP: n/a    Agent: Please be advised that RX refills may take up to 3 business days. We ask that you follow-up with your pharmacy.

## 2021-01-16 ENCOUNTER — Other Ambulatory Visit: Payer: Self-pay

## 2021-01-16 NOTE — Telephone Encounter (Signed)
Patient called and said that she has been out of her medication since last weekend. Informed the patient that it can take up to 3 business days

## 2021-01-16 NOTE — Telephone Encounter (Signed)
Check [place] registry last filled 10/07/2020.Marland KitchenJohny Chess

## 2021-01-20 ENCOUNTER — Other Ambulatory Visit: Payer: Self-pay | Admitting: Internal Medicine

## 2021-01-20 MED ORDER — ALPRAZOLAM 0.5 MG PO TABS
ORAL_TABLET | ORAL | 1 refills | Status: DC
Start: 2021-01-20 — End: 2021-04-23

## 2021-01-21 ENCOUNTER — Other Ambulatory Visit: Payer: Self-pay | Admitting: Internal Medicine

## 2021-01-21 NOTE — Progress Notes (Signed)
done

## 2021-01-22 ENCOUNTER — Encounter (HOSPITAL_BASED_OUTPATIENT_CLINIC_OR_DEPARTMENT_OTHER): Payer: Self-pay | Admitting: Plastic Surgery

## 2021-01-23 ENCOUNTER — Telehealth (INDEPENDENT_AMBULATORY_CARE_PROVIDER_SITE_OTHER): Payer: 59 | Admitting: Internal Medicine

## 2021-01-23 ENCOUNTER — Encounter: Payer: Self-pay | Admitting: Internal Medicine

## 2021-01-23 DIAGNOSIS — J069 Acute upper respiratory infection, unspecified: Secondary | ICD-10-CM

## 2021-01-23 DIAGNOSIS — J4520 Mild intermittent asthma, uncomplicated: Secondary | ICD-10-CM | POA: Diagnosis not present

## 2021-01-23 MED ORDER — CEFDINIR 300 MG PO CAPS: 300.0000 mg | ORAL_CAPSULE | Freq: Two times a day (BID) | ORAL | 0 refills | Status: DC

## 2021-01-23 MED ORDER — HYDROCODONE BIT-HOMATROP MBR 5-1.5 MG/5ML PO SOLN: 5.0000 mL | ORAL | 0 refills | Status: DC | PRN

## 2021-01-23 NOTE — Assessment & Plan Note (Signed)
Omnicef, Hycodan

## 2021-01-23 NOTE — Progress Notes (Signed)
Virtual Visit via Video Note  I connected with [name] on 01/23/21 at  8:10 AM EDT by a video enabled telemedicine application and verified that I am speaking with the correct person using two identifiers.   I discussed the limitations of evaluation and management by telemedicine and the availability of in person appointments. The patient expressed understanding and agreed to proceed.  I was located at our [facility] office. The patient was at work. There was no one else present in the visit.   History of Present Illness:   C/o runny nose, cough - yellow sputum since last week. No chest pain, shortness of breath, abdominal pain, diarrhea, constipation COVID (-)    Observations/Objective: The patient appears to be in no acute distress, looks tired. Coughing  Assessment and Plan:  See my Assessment and Plan. Follow Up Instructions:    I discussed the assessment and treatment plan with the patient. The patient was provided an opportunity to ask questions and all were answered. The patient agreed with the plan and demonstrated an understanding of the instructions.   The patient was advised to call back or seek an in-person evaluation if the symptoms worsen or if the condition fails to improve as anticipated.  I provided face-to-face time during this encounter. We were at different locations.   Walker Kehr, MD

## 2021-01-28 ENCOUNTER — Telehealth: Payer: Self-pay | Admitting: Internal Medicine

## 2021-01-28 NOTE — Progress Notes (Signed)
Called and spoke with receptionist at Dr Nathanial Rancher' office, I let them know that the Pt has decided not to have surgery on 02/05/21.

## 2021-01-28 NOTE — Telephone Encounter (Signed)
   Patient called and said that she still has a slight cough and nasal congestion and a head pressure. She was wondering if something else could be called in. Patient had a virtual on 01-23-21. She said that she took two covid test and said they were negative. Please advise   [id number] - [place], [place]

## 2021-01-29 NOTE — Telephone Encounter (Signed)
Please use over-the-counter cough congestion medicines like Delsym for cough.  Use Claritin daily.  Give it more time.  Thanks

## 2021-01-29 NOTE — Telephone Encounter (Signed)
Pt notified of PCP response & verb understanding. 

## 2021-02-05 ENCOUNTER — Encounter (HOSPITAL_BASED_OUTPATIENT_CLINIC_OR_DEPARTMENT_OTHER): Payer: Self-pay

## 2021-02-05 ENCOUNTER — Ambulatory Visit (HOSPITAL_BASED_OUTPATIENT_CLINIC_OR_DEPARTMENT_OTHER): Admit: 2021-02-05 | Payer: 59 | Admitting: Plastic Surgery

## 2021-02-05 HISTORY — DX: Prediabetes: R73.03

## 2021-02-05 SURGERY — MAMMOPLASTY, REDUCTION
Anesthesia: General | Laterality: Bilateral

## 2021-02-20 ENCOUNTER — Ambulatory Visit: Payer: 59 | Admitting: Internal Medicine

## 2021-02-20 ENCOUNTER — Encounter: Payer: Self-pay | Admitting: Internal Medicine

## 2021-02-20 ENCOUNTER — Other Ambulatory Visit: Payer: Self-pay

## 2021-02-20 DIAGNOSIS — N62 Hypertrophy of breast: Secondary | ICD-10-CM

## 2021-02-20 DIAGNOSIS — M7661 Achilles tendinitis, right leg: Secondary | ICD-10-CM | POA: Diagnosis not present

## 2021-02-20 DIAGNOSIS — M5416 Radiculopathy, lumbar region: Secondary | ICD-10-CM

## 2021-02-20 DIAGNOSIS — G8929 Other chronic pain: Secondary | ICD-10-CM

## 2021-02-20 DIAGNOSIS — M7660 Achilles tendinitis, unspecified leg: Secondary | ICD-10-CM | POA: Insufficient documentation

## 2021-02-20 DIAGNOSIS — M545 Low back pain, unspecified: Secondary | ICD-10-CM

## 2021-02-20 DIAGNOSIS — M542 Cervicalgia: Secondary | ICD-10-CM

## 2021-02-20 NOTE — Progress Notes (Signed)
Subjective:  Patient ID: [name], female    DOB: May 07, 1971  Age: 50 y.o. MRN: [id number]  CC: Foot Pain ((R) Heel)   HPI Ovida Delagarza presents for a large breasts problem - she has an appt to see Dr Marla Roe.  Follow-up on cervical pain and low back pain  C/o R Achilles tendon pain x 2 wks  Outpatient Medications Prior to Visit  Medication Sig Dispense Refill   ALPRAZolam (XANAX) 0.5 MG tablet TAKE 1 TO 2 TABLETS BY MOUTH EVERY DAY AT BEDTIME 60 tablet 1   Armodafinil 150 MG tablet TAKE 1 TABLET BY MOUTH DAILY 90 tablet 0   cetirizine (ZYRTEC) 10 MG tablet Take 10 mg by mouth daily.     chlorhexidine (HIBICLENS) 4 % external liquid Apply topically daily as needed. Use 3/week 1000 mL 3   Cholecalciferol 25 MCG (1000 UT) tablet Take 1,000 Units by mouth daily.     cyclobenzaprine (FLEXERIL) 5 MG tablet TAKE 1 TABLET BY MOUTH THREE TIMES A DAY AS NEEDED FOR MUSCLE SPASMS 60 tablet 2   fluticasone (FLONASE) 50 MCG/ACT nasal spray SPRAY 2 SPRAYS INTO EACH NOSTRIL EVERY DAY 48 mL 0   FYAVOLV 1-5 MG-MCG TABS tablet Take 1 tablet by mouth daily.     HYDROcodone-acetaminophen (NORCO/VICODIN) 5-325 MG tablet Take 1 tablet by mouth every 6 (six) hours as needed for severe pain. 60 tablet 0   meloxicam (MOBIC) 15 MG tablet Take 1 tablet (15 mg total) by mouth daily. 30 tablet 3   mometasone-formoterol (DULERA) 200-5 MCG/ACT AERO Inhale 2 puffs into the lungs 2 (two) times daily. 13 g 5   naproxen (NAPROSYN) 500 MG tablet TAKE 1 TABLET (500 MG TOTAL) BY MOUTH 2 (TWO) TIMES DAILY AS NEEDED FOR MODERATE PAIN OR HEADACHE. USE AS NEEDED 60 tablet 3   norethindrone-ethinyl estradiol (FEMHRT LOW DOSE) 0.5-2.5 MG-MCG tablet Take 1 tablet by mouth daily.     Olmesartan-amLODIPine-HCTZ 40-5-12.5 MG TABS TAKE 1 TABLET BY MOUTH ONCE DAILY 90 tablet 3   oxymetazoline (AFRIN NASAL SPRAY) 0.05 % nasal spray Place 1 spray into both nostrils 2 (two) times daily. Use only for 3days, then stop 30 mL 0    phentermine 30 MG capsule Take by mouth. Take 1 capsule before breakfast everyday     Potassium Chloride ER 20 MEQ TBCR Take 1 tablet by mouth daily. 90 tablet 3   Prenatal 27-1 MG TABS TAKE 1 TABLET BY MOUTH EVERY DAY 90 tablet 3   rizatriptan (MAXALT) 10 MG tablet TAKE 1 TABLET BY MOUTH ONCE AS NEEDED FOR UP TO 1 DOSE FOR MIGRAINE. MAY REPEAT IN 2 HOURS 12 tablet 5   Semaglutide,0.25 or 0.5MG /DOS, (OZEMPIC, 0.25 OR 0.5 MG/DOSE,) 2 MG/1.5ML SOPN Inject 0.5 mg into skin once a week     cefdinir (OMNICEF) 300 MG capsule Take 1 capsule (300 mg total) by mouth 2 (two) times daily. (Patient not taking: Reported on 02/20/2021) 20 capsule 0   HYDROcodone bit-homatropine (HYCODAN) 5-1.5 MG/5ML syrup Take 5 mLs by mouth every 4 (four) hours as needed for cough. (Patient not taking: Reported on 02/20/2021) 240 mL 0   No facility-administered medications prior to visit.    ROS: Review of Systems  Constitutional:  Positive for unexpected weight change. Negative for activity change, appetite change, chills and fatigue.  HENT:  Negative for congestion, mouth sores and sinus pressure.   Eyes:  Negative for visual disturbance.  Respiratory:  Negative for cough and chest tightness.   Gastrointestinal:  Negative for abdominal pain and nausea.  Genitourinary:  Negative for difficulty urinating, frequency and vaginal pain.  Musculoskeletal:  Positive for arthralgias, back pain, gait problem, neck pain and neck stiffness.  Skin:  Negative for pallor and rash.  Neurological:  Negative for dizziness, tremors, weakness, numbness and headaches.  Hematological:  Does not bruise/bleed easily.  Psychiatric/Behavioral:  Negative for confusion and sleep disturbance.    Objective:  BP 120/78 (BP Location: Left Arm)   Pulse 95   Temp 98.3 F (36.8 C) (Oral)   Ht 5\' 3"  (1.6 m)   Wt 228 lb (103.4 kg)   SpO2 99%   BMI 40.39 kg/m   BP Readings from Last 3 Encounters:  02/20/21 120/78  01/02/21 120/88  11/06/20  140/90    Wt Readings from Last 3 Encounters:  02/20/21 228 lb (103.4 kg)  01/02/21 221 lb (100.2 kg)  11/06/20 225 lb 6.4 oz (102.2 kg)    Physical Exam Constitutional:      General: She is not in acute distress.    Appearance: She is well-developed. She is obese.  HENT:     Head: Normocephalic.     Right Ear: External ear normal.     Left Ear: External ear normal.     Nose: Nose normal.  Eyes:     General:        Right eye: No discharge.        Left eye: No discharge.     Conjunctiva/sclera: Conjunctivae normal.     Pupils: Pupils are equal, round, and reactive to light.  Neck:     Thyroid: No thyromegaly.     Vascular: No JVD.     Trachea: No tracheal deviation.  Cardiovascular:     Rate and Rhythm: Normal rate and regular rhythm.     Heart sounds: Normal heart sounds.  Pulmonary:     Effort: No respiratory distress.     Breath sounds: No stridor. No wheezing.  Abdominal:     General: Bowel sounds are normal. There is no distension.     Palpations: Abdomen is soft. There is no mass.     Tenderness: There is no abdominal tenderness. There is no guarding or rebound.  Musculoskeletal:        General: Tenderness present. No deformity.     Cervical back: Normal range of motion and neck supple. Tenderness present. No rigidity.     Right lower leg: No edema.     Left lower leg: No edema.  Lymphadenopathy:     Cervical: No cervical adenopathy.  Skin:    Findings: No erythema or lesion.  Neurological:     Cranial Nerves: No cranial nerve deficit.     Motor: No abnormal muscle tone.     Coordination: Coordination normal.     Gait: Gait normal.     Deep Tendon Reflexes: Reflexes normal.  Psychiatric:        Behavior: Behavior normal.        Thought Content: Thought content normal.        Judgment: Judgment normal.   Right Achilles tendon is painful to palpation.  No swelling or discoloration Lab Results  Component Value Date   WBC 5.9 11/25/2018   HGB 13.0  11/25/2018   HCT 38.8 11/25/2018   PLT 139.0 (L) 11/25/2018   GLUCOSE 90 06/21/2019   CHOL 180 09/30/2017   TRIG 67.0 09/30/2017   HDL 56.30 09/30/2017   LDLCALC 111 (H) 09/30/2017   ALT 12 11/25/2018  AST 16 11/25/2018   NA 140 06/21/2019   K 3.6 06/21/2019   CL 101 06/21/2019   CREATININE 0.88 06/21/2019   BUN 12 06/21/2019   CO2 31 06/21/2019   TSH 0.69 11/25/2018   INR 0.9 RATIO 03/15/2007   HGBA1C 6.0 11/25/2018    MR Lumbar Spine Wo Contrast  Result Date: 05/14/2020 CLINICAL DATA:  Initial evaluation for lower back pain with right-sided radiculopathy. EXAM: MRI LUMBAR SPINE WITHOUT CONTRAST TECHNIQUE: Multiplanar, multisequence MR imaging of the lumbar spine was performed. No intravenous contrast was administered. COMPARISON:  Comparison made with prior radiograph from 03/05/2020 as well as prior MRI from 03/07/2010. FINDINGS: Segmentation: Transitional lumbosacral anatomy. For the purposes of this dictation, the lowest well-formed disc space is labeled L5-S1, with a vestigial S1-2 interspace. Alignment: Trace retrolisthesis of L1 on L2 and L2 on L3, with trace anterolisthesis of L4 on L5 and L5 on S1. Vertebrae: Vertebral body height maintained without acute or chronic fracture. Bone marrow signal intensity within normal limits. Few scattered benign hemangiomata noted. No worrisome osseous lesions. Reactive marrow edema seen about the L5-S1 facets bilaterally due to facet arthritis. No other abnormal marrow edema. Conus medullaris and cauda equina: Conus extends to the L2-3 level. Conus and cauda equina appear normal. Paraspinal and other soft tissues: Paraspinous soft tissues within normal limits. 1.9 cm simple cyst noted within the interpolar left kidney. Enlarged fibroid uterus partially visualized. Disc levels: T11-12 and T12-L1: Unremarkable. L1-2: Trace retrolisthesis. Mild diffuse disc bulge with disc desiccation and intervertebral disc space narrowing. Mild reactive endplate  change. Mild facet hypertrophy. No significant spinal stenosis. Mild right L1 foraminal stenosis. No significant left foraminal narrowing. L2-3: Disc desiccation with mild disc bulge. Superimposed small left subarticular to foraminal disc protrusion (series 10, image 19). Mild flattening of the left ventral thecal sac with resultant mild left lateral recess narrowing. Central canal remains widely patent. Mild left L2 foraminal stenosis. No significant right foraminal narrowing. L3-4: Negative interspace. Mild bilateral facet hypertrophy. No canal or foraminal stenosis. L4-5: Negative interspace. Moderate bilateral facet hypertrophy. No spinal stenosis. Foramina remain patent. L5-S1: Broad-based posterior disc bulge closely approximates the exiting L5 nerve roots bilaterally (series 13, image 36). Superimposed severe bilateral facet degeneration, left slightly worse than right. Trace joint effusion on the left. Associated reactive marrow edema. No significant spinal stenosis. Mild right with mild-to-moderate left L5 foraminal narrowing. IMPRESSION: 1. 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 Superimposed broad posterior disc bulge at this level with resultant mild to moderate left worse than right L5 foraminal stenosis. 2. Disc bulge with small left subarticular to foraminal disc protrusion at L2-3 with resultant mild left foraminal and left lateral recess stenosis. 3. Progressive degenerative disc disease and disc bulge at L1-2 with resultant mild right L1 foraminal stenosis. 4. Transitional lumbosacral anatomy. Careful correlation with numbering system on this exam recommended prior to any potential future intervention. 5. Enlarged fibroid uterus. Electronically Signed   By: Jeannine Boga M.D.   On: 05/14/2020 04:15    Assessment & Plan:     Walker Kehr, MD

## 2021-02-20 NOTE — Assessment & Plan Note (Addendum)
New - R foot 1/4 inch heel lift Voltaren gel, Ice

## 2021-02-20 NOTE — Assessment & Plan Note (Signed)
Breast reduction surgery - [name] will see Dr. Marla Roe

## 2021-02-20 NOTE — Assessment & Plan Note (Signed)
Cervical pain, LBP, R sciatica are aggravated by large breasts Plastic surgery -- Dr Marla Roe

## 2021-02-20 NOTE — Assessment & Plan Note (Signed)
Large breasts discussed - breast reduction should help neck pain.  [name] will see Dr. Marla Roe

## 2021-02-20 NOTE — Assessment & Plan Note (Signed)
Breast reduction surgery - is considered.  Jaicee will see Dr. Marla Roe

## 2021-02-20 NOTE — Patient Instructions (Signed)
1/4 inch heel lift Voltaren gel, Ice   [employer]'s Emergency Medicine: Concepts and Clinical Practice (9th ed., pp. 4782-9562). [place], [place]: [place]. Retrieved from https://www.clinicalkey.com/#!/content/book/[email or web address]">  Achilles Tendinitis  Achilles tendinitis is inflammation of the tough, cord-like band that attaches the lower leg muscles to the heel bone (Achilles tendon). This is usually caused by overusing the tendon and the ankle joint. Achilles tendinitis usually gets better over time with treatment and caring foryourself at home. It can take weeks or months to heal completely. What are the causes? This condition may be caused by: A sudden increase in exercise or activity, such as running. Doing the same exercises or activities, such as jumping, over and over. Not warming up calf muscles before exercising. Exercising in shoes that are worn out or not made for exercise. Having arthritis or a bone growth (spur) on the back of the heel bone. This can rub against the tendon and hurt it. Age-related wear and tear. Tendons become less flexible with age and are more likely to be injured. What are the signs or symptoms? Common symptoms of this condition include: Pain in the Achilles tendon or in the back of the leg, just above the heel. The pain usually gets worse with exercise. Stiffness or soreness in the back of the leg, especially in the morning. Swelling of the skin over the Achilles tendon. Thickening of the tendon. Trouble standing on tiptoe. How is this diagnosed? This condition is diagnosed based on your symptoms and a physical exam. You may have tests, including: X-rays. MRI. How is this treated? The goal of treatment is to relieve symptoms and help your injury heal. Treatment may include: Decreasing or stopping activities that caused the tendinitis. This may mean switching to low-impact exercises like biking or swimming. Icing  the injured area. Doing physical therapy, including strengthening and stretching exercises. Taking NSAIDs, such as ibuprofen, to help relieve pain and swelling. Using supportive shoes, wraps, heel lifts, or a walking boot (air cast). Having surgery. This may be done if your symptoms do not improve after other treatments. Using high-energy shock wave impulses to stimulate the healing process (extracorporeal shock wave therapy). This is rare. Having an injection of medicines that help relieve inflammation (corticosteroids). This is rare. Follow these instructions at home: If you have an air cast: Wear the air cast as told by your health care provider. Remove it only as told by your health care provider. Loosen it if your toes tingle, become numb, or turn cold and blue. Keep it clean. If the air cast is not waterproof: Do not let it get wet. Cover it with a watertight covering when you take a bath or shower. Managing pain, stiffness, and swelling  If directed, put ice on the injured area. To do this: If you have a removable air cast, remove it as told by your health care provider. Put ice in a plastic bag. Place a towel between your skin and the bag. Leave the ice on for 20 minutes, 2-3 times a day. Move your toes often to reduce stiffness and swelling. Raise (elevate) your foot above the level of your heart while you are sitting or lying down.  Activity Gradually return to your normal activities as told by your health care provider. Ask your health care provider what activities are safe for you. Do not do activities that cause pain. Consider doing low-impact exercises, like cycling or swimming. Ask your health care provider when it is safe to drive if you have an  air cast on your foot. If physical therapy was prescribed, do exercises as told by your health care provider or physical therapist. General instructions If directed, wrap your foot with an elastic bandage or other wrap. This  can help to keep your tendon from moving too much while it heals. Your health care provider will show you how to wrap your foot correctly. Wear supportive shoes or heel lifts only as told by your health care provider. Take over-the-counter and prescription medicines only as told by your health care provider. Keep all follow-up visits as told by your health care provider. This is important. Contact a health care provider if you: Have symptoms that get worse. Have pain that does not get better with medicine. Develop new, unexplained symptoms. Develop warmth and swelling in your foot. Have a fever. Get help right away if you: Have a sudden popping sound or sensation in your Achilles tendon followed by severe pain. Cannot move your toes or foot. Cannot put any weight on your foot. Your foot or toes become numb and look white or blue even after loosening your bandage or air cast. Summary Achilles tendinitis is inflammation of the tough, cord-like band that attaches the lower leg muscles to the heel bone (Achilles tendon). This condition is usually caused by overusing the tendon and the ankle joint. It can also be caused by arthritis or normal aging. The most common symptoms of this condition include pain, swelling, or stiffness in the Achilles tendon or in the back of the leg. This condition is usually treated by decreasing or stopping activities that caused the tendinitis, icing the injured area, taking NSAIDs, and doing physical therapy. This information is not intended to replace advice given to you by your health care provider. Make sure you discuss any questions you have with your healthcare provider. Document Revised: 12/06/2018 Document Reviewed: 12/06/2018 Elsevier Patient Education  [place] inserts

## 2021-02-26 ENCOUNTER — Ambulatory Visit (INDEPENDENT_AMBULATORY_CARE_PROVIDER_SITE_OTHER): Payer: 59 | Admitting: Plastic Surgery

## 2021-02-26 ENCOUNTER — Encounter: Payer: Self-pay | Admitting: Plastic Surgery

## 2021-02-26 ENCOUNTER — Other Ambulatory Visit: Payer: Self-pay

## 2021-02-26 VITALS — BP 122/80 | Temp 98.2°F | Resp 16 | Ht 63.0 in | Wt 228.0 lb

## 2021-02-26 DIAGNOSIS — M898X1 Other specified disorders of bone, shoulder: Secondary | ICD-10-CM

## 2021-02-26 DIAGNOSIS — N62 Hypertrophy of breast: Secondary | ICD-10-CM

## 2021-02-26 DIAGNOSIS — M545 Low back pain, unspecified: Secondary | ICD-10-CM

## 2021-02-26 DIAGNOSIS — G8929 Other chronic pain: Secondary | ICD-10-CM

## 2021-02-26 DIAGNOSIS — M542 Cervicalgia: Secondary | ICD-10-CM | POA: Diagnosis not present

## 2021-02-26 NOTE — Progress Notes (Addendum)
Patient ID: [name], female    DOB: 09-Sep-1970, 50 y.o.   MRN: [id number]   Chief Complaint  Patient presents with   Advice Only   Breast Problem    Mammary Hyperplasia: The patient is a 50 y.o. female with a history of mammary hyperplasia for several years.  She has extremely large breasts causing symptoms that include the following: Back pain in the upper and lower back, including neck pain. She pulls or pins her bra straps to provide better lift and relief of the pressure and pain. She notices relief by holding her breast up manually.  Her shoulder straps cause grooves and pain and pressure that requires padding for relief. Pain medication is sometimes required with motrin and tylenol.  Activities that are hindered by enlarged breasts include: exercise and running.  She has tried supportive clothing as well as fitted bras without improvement.  Her breasts are extremely large and fairly symmetric.  She has hyperpigmentation of the inframammary area on both sides.  The sternal to nipple distance on the right is 36 cm and the left is 36 cm.  The IMF distance is 18 cm.  She is 5 feet 3 inches tall and weighs 228 pounds.  The BMI = 40.4.  Preoperative bra size = 42 DDD cup.  The estimated excess breast tissue to be removed at the time of surgery = 750 grams on the left and 750 grams on the right.  Mammogram history: 12/2020 and was negative.  Family history of breast cancer:  none.  Tobacco use:  none.   The patient expresses the desire to pursue surgical intervention.   Review of Systems  Constitutional:  Positive for activity change.  Eyes: Negative.   Respiratory: Negative.  Negative for chest tightness and shortness of breath.   Cardiovascular:  Negative for leg swelling.  Gastrointestinal: Negative.  Negative for abdominal pain.  Endocrine: Negative.   Genitourinary: Negative.   Musculoskeletal:  Positive for back pain.  Skin:  Positive for rash.  Neurological: Negative.    Hematological: Negative.   Psychiatric/Behavioral: Negative.     Past Medical History:  Diagnosis Date   Allergy    rhinitis   Anemia, unspecified    Asthma    asthma   Colitis    Fibromyalgia    Hypertension    Leiomyoma of uterus, unspecified    Meralgia paresthetica    Nausea alone    Obesity    Other B-complex deficiencies    Pre-diabetes    Umbilical hernia    Unspecified vitamin D deficiency     Past Surgical History:  Procedure Laterality Date   fibriodidectomy  2009   MYOMECTOMY  2004   x 2      Current Outpatient Medications:    ALPRAZolam (XANAX) 0.5 MG tablet, TAKE 1 TO 2 TABLETS BY MOUTH EVERY DAY AT BEDTIME, Disp: 60 tablet, Rfl: 1   Armodafinil 150 MG tablet, TAKE 1 TABLET BY MOUTH DAILY, Disp: 90 tablet, Rfl: 0   cetirizine (ZYRTEC) 10 MG tablet, Take 10 mg by mouth daily., Disp: , Rfl:    chlorhexidine (HIBICLENS) 4 % external liquid, Apply topically daily as needed. Use 3/week, Disp: 1000 mL, Rfl: 3   Cholecalciferol 25 MCG (1000 UT) tablet, Take 1,000 Units by mouth daily., Disp: , Rfl:    cyclobenzaprine (FLEXERIL) 5 MG tablet, TAKE 1 TABLET BY MOUTH THREE TIMES A DAY AS NEEDED FOR MUSCLE SPASMS, Disp: 60 tablet, Rfl: 2   fluticasone (  FLONASE) 50 MCG/ACT nasal spray, SPRAY 2 SPRAYS INTO EACH NOSTRIL EVERY DAY, Disp: 48 mL, Rfl: 0   FYAVOLV 1-5 MG-MCG TABS tablet, Take 1 tablet by mouth daily., Disp: , Rfl:    HYDROcodone-acetaminophen (NORCO/VICODIN) 5-325 MG tablet, Take 1 tablet by mouth every 6 (six) hours as needed for severe pain., Disp: 60 tablet, Rfl: 0   meloxicam (MOBIC) 15 MG tablet, Take 1 tablet (15 mg total) by mouth daily., Disp: 30 tablet, Rfl: 3   mometasone-formoterol (DULERA) 200-5 MCG/ACT AERO, Inhale 2 puffs into the lungs 2 (two) times daily., Disp: 13 g, Rfl: 5   naproxen (NAPROSYN) 500 MG tablet, TAKE 1 TABLET (500 MG TOTAL) BY MOUTH 2 (TWO) TIMES DAILY AS NEEDED FOR MODERATE PAIN OR HEADACHE. USE AS NEEDED, Disp: 60 tablet, Rfl:  3   norethindrone-ethinyl estradiol (FEMHRT LOW DOSE) 0.5-2.5 MG-MCG tablet, Take 1 tablet by mouth daily., Disp: , Rfl:    Olmesartan-amLODIPine-HCTZ 40-5-12.5 MG TABS, TAKE 1 TABLET BY MOUTH ONCE DAILY, Disp: 90 tablet, Rfl: 3   oxymetazoline (AFRIN NASAL SPRAY) 0.05 % nasal spray, Place 1 spray into both nostrils 2 (two) times daily. Use only for 3days, then stop, Disp: 30 mL, Rfl: 0   OZEMPIC, 1 MG/DOSE, 4 MG/3ML SOPN, Inject 1 mg into the skin once a week., Disp: , Rfl:    phentermine 30 MG capsule, Take by mouth. Take 1 capsule before breakfast everyday, Disp: , Rfl:    Potassium Chloride ER 20 MEQ TBCR, Take 1 tablet by mouth daily., Disp: 90 tablet, Rfl: 3   Prenatal 27-1 MG TABS, TAKE 1 TABLET BY MOUTH EVERY DAY, Disp: 90 tablet, Rfl: 3   rizatriptan (MAXALT) 10 MG tablet, TAKE 1 TABLET BY MOUTH ONCE AS NEEDED FOR UP TO 1 DOSE FOR MIGRAINE. MAY REPEAT IN 2 HOURS, Disp: 12 tablet, Rfl: 5   Semaglutide,0.25 or 0.'5MG'$ /DOS, (OZEMPIC, 0.25 OR 0.5 MG/DOSE,) 2 MG/1.5ML SOPN, Inject 0.5 mg into skin once a week, Disp: , Rfl:    Objective:   There were no vitals filed for this visit.  Physical Exam Vitals and nursing note reviewed.  Constitutional:      Appearance: Normal appearance.  HENT:     Head: Normocephalic and atraumatic.  Cardiovascular:     Rate and Rhythm: Normal rate.     Pulses: Normal pulses.  Pulmonary:     Effort: Pulmonary effort is normal. No respiratory distress.  Abdominal:     General: Abdomen is flat. There is no distension.     Tenderness: There is no abdominal tenderness.  Skin:    General: Skin is warm.     Capillary Refill: Capillary refill takes less than 2 seconds.     Coloration: Skin is not jaundiced.     Findings: No bruising.  Neurological:     General: No focal deficit present.     Mental Status: She is alert and oriented to person, place, and time.  Psychiatric:        Mood and Affect: Mood normal.        Behavior: Behavior normal.         Thought Content: Thought content normal.  So I think I can do that 750  Assessment & Plan:  Periscapular pain  Neck pain  Chronic bilateral low back pain without sciatica  Symptomatic mammary hypertrophy  The procedure the patient selected and that was best for the patient was discussed. The risk were discussed and include but not limited to the following:  Breast asymmetry, fluid accumulation, firmness of the breast, inability to breast feed, loss of nipple or areola, skin loss, change in skin and nipple sensation, fat necrosis of the breast tissue, bleeding, infection and healing delay.  There are risks of anesthesia and injury to nerves or blood vessels.  Allergic reaction to tape, suture and skin glue are possible.  There will be swelling.  Any of these can lead to the need for revisional surgery.  A breast reduction has potential to interfere with diagnostic procedures in the future.  This procedure is best done when the breast is fully developed.  Changes in the breast will continue to occur over time: pregnancy, weight gain or weigh loss.    Total time: 45 minutes. This includes time spent with the patient during the visit as well as time spent before and after the visit reviewing the chart, documenting the encounter and ordering pertinent studies. and literature emailed to the patient.   Physical therapy:  2021 Mammogram:  negative per patient but we we will do a release of information to get the report.  Recommend bilateral breast reduction with liposuction.    Pictures were obtained of the patient and placed in the chart with the patient's or guardian's permission.   [place], DO

## 2021-03-06 ENCOUNTER — Encounter: Payer: Self-pay | Admitting: Internal Medicine

## 2021-03-06 ENCOUNTER — Telehealth (INDEPENDENT_AMBULATORY_CARE_PROVIDER_SITE_OTHER): Payer: 59 | Admitting: Internal Medicine

## 2021-03-06 DIAGNOSIS — U071 COVID-19: Secondary | ICD-10-CM | POA: Diagnosis not present

## 2021-03-06 MED ORDER — MONTELUKAST SODIUM 10 MG PO TABS: 10.0000 mg | ORAL_TABLET | Freq: Every day | ORAL | 0 refills | Status: DC

## 2021-03-06 NOTE — Assessment & Plan Note (Signed)
Rx singulair, does not need antiviral due to low symptoms. Advised of CDC quarantine timeline.

## 2021-03-06 NOTE — Progress Notes (Signed)
Virtual Visit via Video Note  I connected with [name] on 03/06/21 at  9:40 AM EDT by a video enabled telemedicine application and verified that I am speaking with the correct person using two identifiers.  The patient and the provider were at separate locations throughout the entire encounter. Patient location: home, Provider location: work   I discussed the limitations of evaluation and management by telemedicine and the availability of in person appointments. The patient expressed understanding and agreed to proceed. The patient and the provider were the only parties present for the visit unless noted in HPI below.  History of Present Illness: The patient is a 50 y.o. female with visit for covid-19. Started feeling bad Sunday and tested positive Monday. Exposed to positive. Has congestion. Denies SOB or fevers or chills. Some cough at night. Overall it is stable to mildly improvign. Has tried prior prescription cough medicine.  Observations/Objective: Appearance: normal, breathing appears normal, no coughing during visit, casual grooming, abdomen does not appear distended, throat not well visualized, mental status is A and O times 3  Assessment and Plan: See problem oriented charting  Follow Up Instructions: rx singulair for congestion, advised of quarantine window  I discussed the assessment and treatment plan with the patient. The patient was provided an opportunity to ask questions and all were answered. The patient agreed with the plan and demonstrated an understanding of the instructions.   The patient was advised to call back or seek an in-person evaluation if the symptoms worsen or if the condition fails to improve as anticipated.  Hoyt Koch, MD

## 2021-03-14 ENCOUNTER — Other Ambulatory Visit: Payer: Self-pay | Admitting: Internal Medicine

## 2021-03-25 ENCOUNTER — Other Ambulatory Visit: Payer: Self-pay | Admitting: Internal Medicine

## 2021-03-28 ENCOUNTER — Other Ambulatory Visit: Payer: Self-pay | Admitting: Internal Medicine

## 2021-04-16 ENCOUNTER — Other Ambulatory Visit: Payer: Self-pay | Admitting: Internal Medicine

## 2021-04-18 ENCOUNTER — Other Ambulatory Visit: Payer: Self-pay | Admitting: Internal Medicine

## 2021-04-19 NOTE — Telephone Encounter (Signed)
Check [place] registry last filled  03/18/2021..lmb

## 2021-04-30 ENCOUNTER — Telehealth: Payer: Self-pay

## 2021-04-30 NOTE — Telephone Encounter (Signed)
Returned patients call. She was inquiring about [facility] for a sports bra for after her breast reduction surgery on 06/19/2021 with Dr. Marla Roe. Advised her I will fax over a prescription, and they will call her to schedule an appointment for a fitting.

## 2021-04-30 NOTE — Telephone Encounter (Signed)
Faxed prescription for sports bra fitting to [facility].

## 2021-04-30 NOTE — Telephone Encounter (Signed)
Patient called to find out what type of bra she needs to get from [facility].  Please call.

## 2021-05-20 ENCOUNTER — Telehealth: Payer: Self-pay

## 2021-05-20 NOTE — Telephone Encounter (Signed)
Please advise as the pt is asking if she is in need of a Pneumo vaccine? If so, she would like to come in for one once it is clarified on which one is needed.  Pt is sched for flu vacc on Thursday and if she is due for a pneumo vacc it can be added to that visit.

## 2021-05-20 NOTE — Telephone Encounter (Signed)
Yes.  Prevnar 20.  Thanks

## 2021-05-21 NOTE — Telephone Encounter (Signed)
Notified pt w/MD response. Added Prevnar 20 to nurse visit.Marland KitchenJohny Chess

## 2021-05-23 ENCOUNTER — Ambulatory Visit (INDEPENDENT_AMBULATORY_CARE_PROVIDER_SITE_OTHER): Payer: 59

## 2021-05-23 ENCOUNTER — Other Ambulatory Visit: Payer: Self-pay

## 2021-05-23 DIAGNOSIS — Z23 Encounter for immunization: Secondary | ICD-10-CM

## 2021-05-27 NOTE — Progress Notes (Addendum)
Patient ID: [name], female    DOB: January 27, 1971, 50 y.o.   MRN: [id number]  Chief Complaint  Patient presents with   Pre-op Exam       ICD-10-CM   1. Periscapular pain  M89.8X1        History of Present Illness: [name] is a 50 y.o.  female  with a history of mammary hyperplasia.  She presents for preoperative evaluation for upcoming procedure, bilateral breast reduction with liposuction, scheduled for 06/19/2021 with Dr. Marla Roe.  The patient has not had problems with anesthesia.  She reports that she had myomectomy in the past without complication from anesthesia.  She denies any personal or family history of breast cancer.  She denies personal history of clots or clotting disorder, but does report that her uncle had DVT.  She denies any varicosities or leg swelling.  She is on an estrogen pill for perimenopausal symptoms which she will hold until after surgery.  She also will hold her phentermine 1 week preop as well as her Mobic/naproxen.  She tells me that ideally she would be a C cup.  She had been prescribed #60 Norco 01/02/2021 and she still has a few remaining.  She also reports that she takes Flexeril to help her sleep at night given her early stage fibromyalgia.  Summary of Previous Visit: Patient was seen here for initial consult on 02/26/2021.  At that time, she complained of chronic back and neck pain.  She also endorsed activities such as exercise and running being hindered by her large breasts.  Preoperative bra size equals 42 DDD.  STN 36 cm bilaterally.  Estimated excess breast tissue to be removed at time of surgery equal 750 g each side.  Most recent mammogram 12/2020, negative.  No family history of breast cancer.  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  PMH Significant for: Mammary hypertrophy, fibromyalgia, asthma, hidradenitis, obesity.   Past Medical History: Allergies: No Known Allergies  Current Medications:  Current  Outpatient Medications:    ALPRAZolam (XANAX) 0.5 MG tablet, TAKE 1 TO 2 TABLETS BY MOUTH EVERY DAY AT BEDTIME, Disp: 60 tablet, Rfl: 1   Armodafinil 150 MG tablet, TAKE 1 TABLET BY MOUTH DAILY, Disp: 90 tablet, Rfl: 0   cetirizine (ZYRTEC) 10 MG tablet, Take 10 mg by mouth daily., Disp: , Rfl:    chlorhexidine (HIBICLENS) 4 % external liquid, Apply topically daily as needed. Use 3/week, Disp: 1000 mL, Rfl: 3   Cholecalciferol 25 MCG (1000 UT) tablet, Take 1,000 Units by mouth daily., Disp: , Rfl:    cyclobenzaprine (FLEXERIL) 5 MG tablet, TAKE 1 TABLET BY MOUTH THREE TIMES A DAY AS NEEDED FOR MUSCLE SPASMS, Disp: 60 tablet, Rfl: 2   fluticasone (FLONASE) 50 MCG/ACT nasal spray, SPRAY 2 SPRAYS INTO EACH NOSTRIL EVERY DAY, Disp: 48 mL, Rfl: 0   FYAVOLV 1-5 MG-MCG TABS tablet, Take 1 tablet by mouth daily., Disp: , Rfl:    mometasone-formoterol (DULERA) 200-5 MCG/ACT AERO, Inhale 2 puffs into the lungs 2 (two) times daily., Disp: 13 g, Rfl: 5   montelukast (SINGULAIR) 10 MG tablet, TAKE 1 TABLET BY MOUTH EVERYDAY AT BEDTIME, Disp: 90 tablet, Rfl: 1   naproxen (NAPROSYN) 500 MG tablet, TAKE 1 TABLET BY MOUTH TWICE A DAY AS NEEDED FOR MODERATE PAIN OR HEADACHE, Disp: 60 tablet, Rfl: 3   norethindrone-ethinyl estradiol (FEMHRT LOW DOSE) 0.5-2.5 MG-MCG tablet, Take 1 tablet by mouth daily., Disp: , Rfl:  Olmesartan-amLODIPine-HCTZ 40-5-12.5 MG TABS, TAKE 1 TABLET BY MOUTH ONCE DAILY, Disp: 90 tablet, Rfl: 3   oxymetazoline (AFRIN NASAL SPRAY) 0.05 % nasal spray, Place 1 spray into both nostrils 2 (two) times daily. Use only for 3days, then stop, Disp: 30 mL, Rfl: 0   OZEMPIC, 1 MG/DOSE, 4 MG/3ML SOPN, Inject 1 mg into the skin once a week., Disp: , Rfl:    phentermine (ADIPEX-P) 37.5 MG tablet, Take 37.5 mg by mouth every morning., Disp: , Rfl:    Potassium Chloride ER 20 MEQ TBCR, Take 1 tablet by mouth daily., Disp: 90 tablet, Rfl: 3   Prenatal 27-1 MG TABS, TAKE 1 TABLET BY MOUTH EVERY DAY, Disp: 90  tablet, Rfl: 3   rizatriptan (MAXALT) 10 MG tablet, TAKE 1 TABLET BY MOUTH ONCE AS NEEDED FOR UP TO 1 DOSE FOR MIGRAINE. MAY REPEAT IN 2 HOURS, Disp: 12 tablet, Rfl: 5   Semaglutide,0.25 or 0.5MG /DOS, (OZEMPIC, 0.25 OR 0.5 MG/DOSE,) 2 MG/1.5ML SOPN, Inject 0.5 mg into skin once a week, Disp: , Rfl:    HYDROcodone-acetaminophen (NORCO/VICODIN) 5-325 MG tablet, Take 1 tablet by mouth every 6 (six) hours as needed for severe pain. (Patient not taking: Reported on 05/28/2021), Disp: 60 tablet, Rfl: 0   meloxicam (MOBIC) 15 MG tablet, Take 1 tablet (15 mg total) by mouth daily., Disp: 30 tablet, Rfl: 3   phentermine 30 MG capsule, Take by mouth. Take 1 capsule before breakfast everyday, Disp: , Rfl:   Past Medical Problems: Past Medical History:  Diagnosis Date   Allergy    rhinitis   Anemia, unspecified    Asthma    asthma   Colitis    Fibromyalgia    Hypertension    Leiomyoma of uterus, unspecified    Meralgia paresthetica    Nausea alone    Obesity    Other B-complex deficiencies    Pre-diabetes    Umbilical hernia    Unspecified vitamin D deficiency     Past Surgical History: Past Surgical History:  Procedure Laterality Date   fibriodidectomy  2009   MYOMECTOMY  2004   x 2    Social History: Social History   Socioeconomic History   Marital status: Single    Spouse name: Not on file   Number of children: 0   Years of education: Not on file   Highest education level: Not on file  Occupational History   Occupation: Environmental health practitioner: [employer]  Tobacco Use   Smoking status: Never   Smokeless tobacco: Never  Vaping Use   Vaping Use: Never used  Substance and Sexual Activity   Alcohol use: No   Drug use: No   Sexual activity: Not on file  Other Topics Concern   Not on file  Social History Narrative   Not on file   Social Determinants of Health   Financial Resource Strain: Not on file  Food Insecurity: Not on file  Transportation Needs: Not on file   Physical Activity: Not on file  Stress: Not on file  Social Connections: Not on file  Intimate Partner Violence: Not on file    Family History: Family History  Problem Relation Age of Onset   Hypertension Other    High blood pressure Father    Alcoholism Father     Review of Systems: ROS No recent illness or infection.  Physical Exam: Vital Signs BP 120/82 (BP Location: Left Arm, Patient Position: Sitting, Cuff Size: Large)   Pulse 96  Ht 5\' 3"  (1.6 m)   Wt 220 lb (99.8 kg)   SpO2 99%   BMI 38.97 kg/m   Physical Exam Constitutional:      General: Not in acute distress.    Appearance: Normal appearance. Not ill-appearing.  HENT:     Head: Normocephalic and atraumatic.  Eyes:     Pupils: Pupils are equal, round Neck:     Musculoskeletal: Normal range of motion.  Cardiovascular:     Rate and Rhythm: Normal rate    Pulses: Normal pulses.  Pulmonary:     Effort: Pulmonary effort is normal. No respiratory distress.  Abdominal:     General: Abdomen is flat. There is no distension.  Musculoskeletal: Normal range of motion.  No lower extremity swelling or edema.  No varicosities noted. Skin:    General: Skin is warm and dry.     Findings: No erythema or rash.  Neurological:     General: No focal deficit present.     Mental Status: Alert and oriented to person, place, and time. Mental status is at baseline.     Motor: No weakness.  Psychiatric:        Mood and Affect: Mood normal.        Behavior: Behavior normal.    Assessment/Plan: The patient is scheduled for bilateral breast reduction with liposuction 06/19/2021 with Dr. Marla Roe.  Risks, benefits, and alternatives of procedure discussed, questions answered and consent obtained.    Smoking Status: Non-smoker. Last Mammogram: 12/2020; Results: BI-RADS Category 1, negative.  Caprini Score: 8, high; Risk Factors include: Family history of thrombosis, age, BMI greater than 25, oral estrogen for perimenopausal  symptoms (holding), and length of planned surgery. Recommendation for mechanical and possibly pharmacological prophylaxis.  Will discuss with surgeon.  Encourage early ambulation.   Pictures obtained: 02/26/2021  Post-op Rx sent to pharmacy: Norco, Keflex, Zofran.  Reviewed PDMP, patient was prescribed Norco over 4 months ago and is only taking 1 pill every 2-3 days on average for breakthrough pains.  Discussed risk of drowsiness and respiratory depression, particularly in setting of muscle relaxants and benzodiazepines.  Patient was provided with the General Surgical Risk consent document and Pain Medication Agreement prior to their appointment.  They had adequate time to read through the risk consent documents and Pain Medication Agreement. We also discussed them in person together during this preop appointment. All of their questions were answered to their satisfaction.  Recommended calling if they have any further questions.  Risk consent form and Pain Medication Agreement to be scanned into patient's chart.  The risk that can be encountered with breast reduction were discussed and include the following but not limited to these:  Breast asymmetry, fluid accumulation, firmness of the breast, inability to breast feed, loss of nipple or areola, skin loss, decrease or no nipple sensation, fat necrosis of the breast tissue, bleeding, infection, healing delay.  There are risks of anesthesia, changes to skin sensation and injury to nerves or blood vessels.  The muscle can be temporarily or permanently injured.  You may have an allergic reaction to tape, suture, glue, blood products which can result in skin discoloration, swelling, pain, skin lesions, poor healing.  Any of these can lead to the need for revisonal surgery or stage procedures.  A reduction has potential to interfere with diagnostic procedures.  Nipple or breast piercing can increase risks of infection.  This procedure is best done when the breast  is fully developed.  Changes in the breast  will continue to occur over time.  Pregnancy can alter the outcomes of previous breast reduction surgery, weight gain and weigh loss can also effect the long term appearance.    Electronically signed by: Krista Blue, PA-C 05/28/2021 8:34 AM

## 2021-05-28 ENCOUNTER — Ambulatory Visit (INDEPENDENT_AMBULATORY_CARE_PROVIDER_SITE_OTHER): Payer: 59 | Admitting: Physician Assistant

## 2021-05-28 ENCOUNTER — Encounter: Payer: Self-pay | Admitting: Physician Assistant

## 2021-05-28 ENCOUNTER — Other Ambulatory Visit: Payer: Self-pay

## 2021-05-28 VITALS — BP 120/82 | HR 96 | Ht 63.0 in | Wt 220.0 lb

## 2021-05-28 DIAGNOSIS — M898X1 Other specified disorders of bone, shoulder: Secondary | ICD-10-CM

## 2021-05-28 MED ORDER — ONDANSETRON 4 MG PO TBDP: 4.0000 mg | ORAL_TABLET | Freq: Three times a day (TID) | ORAL | 0 refills | Status: DC | PRN

## 2021-05-28 MED ORDER — HYDROCODONE-ACETAMINOPHEN 5-325 MG PO TABS
1.0000 | ORAL_TABLET | Freq: Four times a day (QID) | ORAL | 0 refills | Status: AC | PRN
Start: 2021-05-28 — End: 2021-06-02

## 2021-05-28 MED ORDER — CEPHALEXIN 500 MG PO CAPS: 500.0000 mg | ORAL_CAPSULE | Freq: Four times a day (QID) | ORAL | 0 refills | Status: AC

## 2021-05-28 NOTE — Addendum Note (Signed)
Addended by: Krista Blue on: 05/28/2021 12:35 PM   Modules accepted: Orders

## 2021-05-30 ENCOUNTER — Telehealth: Payer: Self-pay | Admitting: Internal Medicine

## 2021-05-30 NOTE — Telephone Encounter (Signed)
Patient states she received the flu and pneumonia vac on 05-23-2021  Patient states the next day she started having head and ear aches  Patient is requesting a call back

## 2021-05-31 NOTE — Telephone Encounter (Signed)
Take, NyQuil or DayQuil as needed.  Office visit if problems.  Thanks

## 2021-05-31 NOTE — Telephone Encounter (Signed)
Notified pt w/MD response. Pt states se has tried both med. Requesting antibiotic. Inform pt will have to be seen before antibiotic can be rx. Made appt for 06/04/21.Marland KitchenJohny Chess

## 2021-06-03 ENCOUNTER — Telehealth: Payer: Self-pay | Admitting: *Deleted

## 2021-06-03 NOTE — Telephone Encounter (Signed)
Received on (05/28/21) via of fax DME Standard Written Order requesting signature and return.  Given to provider to sign.  Orders signed and faxed back to [facility].  Confirmation received and copy scanned into the chart.//AB/CMA

## 2021-06-05 ENCOUNTER — Other Ambulatory Visit: Payer: Self-pay

## 2021-06-05 ENCOUNTER — Ambulatory Visit (INDEPENDENT_AMBULATORY_CARE_PROVIDER_SITE_OTHER): Payer: 59 | Admitting: Internal Medicine

## 2021-06-05 ENCOUNTER — Encounter: Payer: Self-pay | Admitting: Internal Medicine

## 2021-06-05 DIAGNOSIS — J0101 Acute recurrent maxillary sinusitis: Secondary | ICD-10-CM | POA: Diagnosis not present

## 2021-06-05 MED ORDER — CEFDINIR 300 MG PO CAPS: 300.0000 mg | ORAL_CAPSULE | Freq: Two times a day (BID) | ORAL | 0 refills | Status: DC

## 2021-06-05 MED ORDER — FLUCONAZOLE 150 MG PO TABS: 150.0000 mg | ORAL_TABLET | Freq: Once | ORAL | 1 refills | Status: AC

## 2021-06-05 NOTE — Progress Notes (Signed)
Subjective:  Patient ID: [name], female    DOB: [birth year]  Age: 50 y.o. MRN: [id number]  CC: Nasal Congestion and CHEST CONGESTION   HPI [name] presents for sinus congestion x 1 wk - worse  Outpatient Medications Prior to Visit  Medication Sig Dispense Refill   ALPRAZolam (XANAX) 0.5 MG tablet TAKE 1 TO 2 TABLETS BY MOUTH EVERY DAY AT BEDTIME 60 tablet 1   Armodafinil 150 MG tablet TAKE 1 TABLET BY MOUTH DAILY 90 tablet 0   cetirizine (ZYRTEC) 10 MG tablet Take 10 mg by mouth daily.     chlorhexidine (HIBICLENS) 4 % external liquid Apply topically daily as needed. Use 3/week 1000 mL 3   Cholecalciferol 25 MCG (1000 UT) tablet Take 1,000 Units by mouth daily.     cyclobenzaprine (FLEXERIL) 5 MG tablet TAKE 1 TABLET BY MOUTH THREE TIMES A DAY AS NEEDED FOR MUSCLE SPASMS 60 tablet 2   fluticasone (FLONASE) 50 MCG/ACT nasal spray SPRAY 2 SPRAYS INTO EACH NOSTRIL EVERY DAY 48 mL 0   mometasone-formoterol (DULERA) 200-5 MCG/ACT AERO Inhale 2 puffs into the lungs 2 (two) times daily. 13 g 5   montelukast (SINGULAIR) 10 MG tablet TAKE 1 TABLET BY MOUTH EVERYDAY AT BEDTIME 90 tablet 1   naproxen (NAPROSYN) 500 MG tablet TAKE 1 TABLET BY MOUTH TWICE A DAY AS NEEDED FOR MODERATE PAIN OR HEADACHE 60 tablet 3   norethindrone-ethinyl estradiol (FEMHRT LOW DOSE) 0.5-2.5 MG-MCG tablet Take 1 tablet by mouth daily.     Olmesartan-amLODIPine-HCTZ 40-5-12.5 MG TABS TAKE 1 TABLET BY MOUTH ONCE DAILY 90 tablet 3   OZEMPIC, 1 MG/DOSE, 4 MG/3ML SOPN Inject 1 mg into the skin once a week.     phentermine (ADIPEX-P) 37.5 MG tablet Take 37.5 mg by mouth every morning.     Potassium Chloride ER 20 MEQ TBCR Take 1 tablet by mouth daily. 90 tablet 3   Prenatal 27-1 MG TABS TAKE 1 TABLET BY MOUTH EVERY DAY 90 tablet 3   rizatriptan (MAXALT) 10 MG tablet TAKE 1 TABLET BY MOUTH ONCE AS NEEDED FOR UP TO 1 DOSE FOR MIGRAINE. MAY REPEAT IN 2 HOURS 12 tablet 5   FYAVOLV 1-5 MG-MCG TABS tablet Take 1  tablet by mouth daily. (Patient not taking: Reported on 06/05/2021)     HYDROcodone-acetaminophen (NORCO/VICODIN) 5-325 MG tablet Take 1 tablet by mouth every 6 (six) hours as needed for severe pain. (Patient not taking: No sig reported) 60 tablet 0   meloxicam (MOBIC) 15 MG tablet Take 1 tablet (15 mg total) by mouth daily. (Patient not taking: Reported on 06/05/2021) 30 tablet 3   ondansetron (ZOFRAN ODT) 4 MG disintegrating tablet Take 1 tablet (4 mg total) by mouth every 8 (eight) hours as needed for nausea or vomiting. (Patient not taking: Reported on 06/05/2021) 20 tablet 0   oxymetazoline (AFRIN NASAL SPRAY) 0.05 % nasal spray Place 1 spray into both nostrils 2 (two) times daily. Use only for 3days, then stop (Patient not taking: Reported on 06/05/2021) 30 mL 0   phentermine 30 MG capsule Take by mouth. Take 1 capsule before breakfast everyday (Patient not taking: Reported on 06/05/2021)     Semaglutide,0.25 or 0.5MG /DOS, (OZEMPIC, 0.25 OR 0.5 MG/DOSE,) 2 MG/1.5ML SOPN Inject 0.5 mg into skin once a week (Patient not taking: Reported on 06/05/2021)     No facility-administered medications prior to visit.    ROS: Review of Systems  Constitutional:  Negative for activity change, appetite change, chills, fatigue  and unexpected weight change.  HENT:  Positive for congestion, ear pain, rhinorrhea and sinus pressure. Negative for mouth sores.   Eyes:  Negative for visual disturbance.  Respiratory:  Negative for cough and chest tightness.   Gastrointestinal:  Negative for abdominal pain and nausea.  Genitourinary:  Negative for difficulty urinating, frequency and vaginal pain.  Musculoskeletal:  Positive for back pain. Negative for gait problem.  Skin:  Negative for pallor and rash.  Neurological:  Negative for dizziness, tremors, weakness, numbness and headaches.  Psychiatric/Behavioral:  Positive for sleep disturbance. Negative for confusion.    Objective:  BP 130/72 (BP Location: Left Arm)    Pulse 87   Temp 98.2 F (36.8 C) (Oral)   SpO2 99%   BP Readings from Last 3 Encounters:  06/05/21 130/72  05/28/21 120/82  03/05/21 122/80    Wt Readings from Last 3 Encounters:  05/28/21 220 lb (99.8 kg)  03/05/21 228 lb (103.4 kg)  02/20/21 228 lb (103.4 kg)    Physical Exam Constitutional:      General: She is not in acute distress.    Appearance: She is well-developed. She is obese.  HENT:     Head: Normocephalic.     Right Ear: External ear normal.     Left Ear: External ear normal.     Nose: Nose normal.  Eyes:     General:        Right eye: No discharge.        Left eye: No discharge.     Conjunctiva/sclera: Conjunctivae normal.     Pupils: Pupils are equal, round, and reactive to light.  Neck:     Thyroid: No thyromegaly.     Vascular: No JVD.     Trachea: No tracheal deviation.  Cardiovascular:     Rate and Rhythm: Normal rate and regular rhythm.     Heart sounds: Normal heart sounds.  Pulmonary:     Effort: No respiratory distress.     Breath sounds: No stridor. No wheezing.  Abdominal:     General: Bowel sounds are normal. There is no distension.     Palpations: Abdomen is soft. There is no mass.     Tenderness: There is no abdominal tenderness. There is no guarding or rebound.  Musculoskeletal:        General: No tenderness.     Cervical back: Normal range of motion and neck supple. No rigidity.  Lymphadenopathy:     Cervical: No cervical adenopathy.  Skin:    Findings: No erythema or rash.  Neurological:     Cranial Nerves: No cranial nerve deficit.     Motor: No abnormal muscle tone.     Coordination: Coordination normal.     Deep Tendon Reflexes: Reflexes normal.  Psychiatric:        Behavior: Behavior normal.        Thought Content: Thought content normal.        Judgment: Judgment normal.  Swollen nasal mucosa  Lab Results  Component Value Date   WBC 5.9 11/25/2018   HGB 13.0 11/25/2018   HCT 38.8 11/25/2018   PLT 139.0 (L)  11/25/2018   GLUCOSE 90 06/21/2019   CHOL 180 09/30/2017   TRIG 67.0 09/30/2017   HDL 56.30 09/30/2017   LDLCALC 111 (H) 09/30/2017   ALT 12 11/25/2018   AST 16 11/25/2018   NA 140 06/21/2019   K 3.6 06/21/2019   CL 101 06/21/2019   CREATININE 0.88 06/21/2019   BUN  12 06/21/2019   CO2 31 06/21/2019   TSH 0.69 11/25/2018   INR 0.9 RATIO 03/15/2007   HGBA1C 6.0 11/25/2018    MR Lumbar Spine Wo Contrast  Result Date: 05/14/2020 CLINICAL DATA:  Initial evaluation for lower back pain with right-sided radiculopathy. EXAM: MRI LUMBAR SPINE WITHOUT CONTRAST TECHNIQUE: Multiplanar, multisequence MR imaging of the lumbar spine was performed. No intravenous contrast was administered. COMPARISON:  Comparison made with prior radiograph from 03/05/2020 as well as prior MRI from 03/07/2010. FINDINGS: Segmentation: Transitional lumbosacral anatomy. For the purposes of this dictation, the lowest well-formed disc space is labeled L5-S1, with a vestigial S1-2 interspace. Alignment: Trace retrolisthesis of L1 on L2 and L2 on L3, with trace anterolisthesis of L4 on L5 and L5 on S1. Vertebrae: Vertebral body height maintained without acute or chronic fracture. Bone marrow signal intensity within normal limits. Few scattered benign hemangiomata noted. No worrisome osseous lesions. Reactive marrow edema seen about the L5-S1 facets bilaterally due to facet arthritis. No other abnormal marrow edema. Conus medullaris and cauda equina: Conus extends to the L2-3 level. Conus and cauda equina appear normal. Paraspinal and other soft tissues: Paraspinous soft tissues within normal limits. 1.9 cm simple cyst noted within the interpolar left kidney. Enlarged fibroid uterus partially visualized. Disc levels: T11-12 and T12-L1: Unremarkable. L1-2: Trace retrolisthesis. Mild diffuse disc bulge with disc desiccation and intervertebral disc space narrowing. Mild reactive endplate change. Mild facet hypertrophy. No significant  spinal stenosis. Mild right L1 foraminal stenosis. No significant left foraminal narrowing. L2-3: Disc desiccation with mild disc bulge. Superimposed small left subarticular to foraminal disc protrusion (series 10, image 19). Mild flattening of the left ventral thecal sac with resultant mild left lateral recess narrowing. Central canal remains widely patent. Mild left L2 foraminal stenosis. No significant right foraminal narrowing. L3-4: Negative interspace. Mild bilateral facet hypertrophy. No canal or foraminal stenosis. L4-5: Negative interspace. Moderate bilateral facet hypertrophy. No spinal stenosis. Foramina remain patent. L5-S1: Broad-based posterior disc bulge closely approximates the exiting L5 nerve roots bilaterally (series 13, image 36). Superimposed severe bilateral facet degeneration, left slightly worse than right. Trace joint effusion on the left. Associated reactive marrow edema. No significant spinal stenosis. Mild right with mild-to-moderate left L5 foraminal narrowing. IMPRESSION: 1. 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 Superimposed broad posterior disc bulge at this level with resultant mild to moderate left worse than right L5 foraminal stenosis. 2. Disc bulge with small left subarticular to foraminal disc protrusion at L2-3 with resultant mild left foraminal and left lateral recess stenosis. 3. Progressive degenerative disc disease and disc bulge at L1-2 with resultant mild right L1 foraminal stenosis. 4. Transitional lumbosacral anatomy. Careful correlation with numbering system on this exam recommended prior to any potential future intervention. 5. Enlarged fibroid uterus. Electronically Signed   By: Jeannine Boga M.D.   On: 05/14/2020 04:15    Assessment & Plan:   Problem List Items Addressed This Visit     Sinusitis, acute    New Start Omnicef Diflucan prn      Relevant  Medications   cefdinir (OMNICEF) 300 MG capsule      Meds ordered this encounter  Medications   cefdinir (OMNICEF) 300 MG capsule    Sig: Take 1 capsule (300 mg total) by mouth 2 (two) times daily.    Dispense:  20 capsule    Refill:  0   fluconazole (DIFLUCAN) 150 MG  tablet    Sig: Take 1 tablet (150 mg total) by mouth once for 1 dose.    Dispense:  1 tablet    Refill:  1      Follow-up: No follow-ups on file.  Walker Kehr, MD

## 2021-06-05 NOTE — Assessment & Plan Note (Signed)
New Start Omnicef Diflucan prn

## 2021-06-08 ENCOUNTER — Encounter: Payer: Self-pay | Admitting: Internal Medicine

## 2021-06-24 NOTE — Progress Notes (Signed)
Patient is a 50 year old female with PMH of macromastia s/p bilateral breast reduction with liposuction performed 06/19/2021 with Dr. Marla Roe who presents to clinic for postoperative follow-up.  Today, patient is doing exceptionally well.  Her JP drains are all intact and functional, diminished volumes each day.  She does have some bruising over the lateral aspects of breasts bilaterally, left greater than right.  Denies any significant pain symptoms.  States that she only required Norco the first day and since has been managing with Tylenol alone.  She also will take naproxen intermittently.  She denies any fevers, chills, redness, or other complication.  No obvious drainage or wounds.  Steri-Strips remain firmly intact.  She is continues compressive garments.  Physical exam reassuring.  Good shape and symmetry.  Patient is quite pleased with outcome.  Bruising noted, but no obvious dehiscence or wounds noted.  Steri-Strips are dry and intact.  JP drain is functional.  Continue with activity modifications and compression.  We will not pull the JP drains today given that she is only 6 days postop and there was a large amount of tissue excised.  Furthermore, she has a good amount of bruising over the lateral aspects of breasts and her left lateral breast felt mildly firm.  No obvious subcutaneous fluid collection, but we will want her to have drains for at least an additional week.  She will call the clinic should she have any new or worsening symptoms.  Overall, she is extremely pleased with the surgical outcome.

## 2021-06-25 ENCOUNTER — Other Ambulatory Visit: Payer: Self-pay

## 2021-06-25 ENCOUNTER — Ambulatory Visit (INDEPENDENT_AMBULATORY_CARE_PROVIDER_SITE_OTHER): Payer: 59 | Admitting: Physician Assistant

## 2021-06-25 ENCOUNTER — Encounter: Payer: 59 | Admitting: Plastic Surgery

## 2021-06-25 DIAGNOSIS — Z9889 Other specified postprocedural states: Secondary | ICD-10-CM

## 2021-07-01 ENCOUNTER — Telehealth: Payer: Self-pay

## 2021-07-01 NOTE — Telephone Encounter (Signed)
I called patient regarding paperwork and she let me know that her right tube is hurting and looks like it has crust around it.  Patient said that the blood coming out of the right tube is light in color and the blood coming out of the left tube is dark in color.  Patient has already spoken with Krista Blue, PA-C.

## 2021-07-01 NOTE — Telephone Encounter (Signed)
I called patient regarding paperwork and she let me know that her right tube is hurting and looks like it has crust around it.  Patient said that the blood coming out of the right tube is light in color and the blood coming out of the left tube is dark in color.   Patient has already spoken with Krista Blue, PA-C.

## 2021-07-01 NOTE — Telephone Encounter (Signed)
Patient left voicemail requesting call back. She stated her right tube is bothering her after breast reduction surgery on 11/16.

## 2021-07-03 ENCOUNTER — Telehealth: Payer: Self-pay

## 2021-07-03 NOTE — Telephone Encounter (Signed)
Patient called to state the right breast is now red and she has noticed she has a lump there. She would like a call back.

## 2021-07-04 ENCOUNTER — Encounter: Payer: Self-pay | Admitting: Plastic Surgery

## 2021-07-04 ENCOUNTER — Telehealth: Payer: Self-pay | Admitting: Plastic Surgery

## 2021-07-04 NOTE — Telephone Encounter (Signed)
Returned patients call. She is complaining of redness and tenderness at both drain sites. The out puts have been averaging 1ML since 06/27/2021. She has an appointment tomorrow at 11:20 with Donna Christen to have drains removed.

## 2021-07-04 NOTE — Telephone Encounter (Signed)
Spoke with patient. She is having pain at tube insertion sites with minimal drainage. Moved her appointment up to tomorrow morning. Thank you

## 2021-07-04 NOTE — Telephone Encounter (Signed)
Patient called in regards to a phone call she made with [place] yesterday. Patient states that the pain has gotten worse and she now is worried that her drains are beginning to come out. She said that they are draining but not as much as they were.  Patient will be sending pictures through [place] and would really like someone to give her a call.  Please Follow Up with patient.

## 2021-07-05 ENCOUNTER — Other Ambulatory Visit: Payer: Self-pay

## 2021-07-05 ENCOUNTER — Ambulatory Visit (INDEPENDENT_AMBULATORY_CARE_PROVIDER_SITE_OTHER): Payer: 59 | Admitting: Physician Assistant

## 2021-07-05 ENCOUNTER — Other Ambulatory Visit: Payer: Self-pay | Admitting: Internal Medicine

## 2021-07-05 ENCOUNTER — Ambulatory Visit: Payer: 59 | Admitting: Physician Assistant

## 2021-07-05 DIAGNOSIS — Z9889 Other specified postprocedural states: Secondary | ICD-10-CM

## 2021-07-05 NOTE — Progress Notes (Signed)
Patient is a 50 year old female with PMH of macromastia s/p bilateral breast reduction with liposuction performed 06/19/2021 with Dr. Marla Roe who presents to clinic for postoperative follow-up.  She was last seen here in clinic 06/25/2021, approximately 6 days postop.  At that time, her exam was quite reassuring.  Mild bruising appreciated over lateral aspects of breasts bilaterally, left greater than right.  Pain symptoms well controlled and she was continue to wear compressive garments.  Plan is for JP drain removal at subsequent visit.  She then called the office complaining of pain at tube insertion site and states that there is hardly any drainage.  She reports they are still charged and functioning.  Today, on my exam, she complains predominantly of pain at right-sided tube insertion site.  She provides me with her drainage volume record which is less than 5 cc/day for the past week from each drain.  She denies any change in character of the drainage.  She does state that there is a mild odor in her inframammary regions bilaterally, and that there is some mild saturation of her Steri-Strips at inferior T-zone.  She denies any fevers or other systemic symptoms.  She does however endorse worsening pain, again solely at the tube insertion site.  Physical exam is entirely reassuring.  Her Steri-Strips are all firmly intact.  Good swelling and symmetry of breast.  No obvious subcutaneous fluid collections.  No erythema.  NAC's are viable.  JP drains intact and functional.  Normal-appearing drainage in bulbs bilaterally.  JP drains removed without complication or difficulty.  While there is mild redness right-sided tube insertion site, suspect simple irritation.  No evidence particular concerning for infection and at this time do not feel as though antibiotics are warranted.  Suspect that it will improve with Vaseline gauze.  She already has another postoperative appointment scheduled with Dr. Marla Roe  next week.  We will leave Steri-Strips on in interim given that they remain firmly intact.  She will call should she develop any questions or concerns.

## 2021-07-09 ENCOUNTER — Encounter: Payer: 59 | Admitting: Surgical

## 2021-07-09 ENCOUNTER — Other Ambulatory Visit: Payer: Self-pay

## 2021-07-09 ENCOUNTER — Telehealth: Payer: Self-pay | Admitting: Plastic Surgery

## 2021-07-09 ENCOUNTER — Encounter: Payer: Self-pay | Admitting: Plastic Surgery

## 2021-07-09 ENCOUNTER — Ambulatory Visit (INDEPENDENT_AMBULATORY_CARE_PROVIDER_SITE_OTHER): Payer: 59 | Admitting: Plastic Surgery

## 2021-07-09 DIAGNOSIS — N62 Hypertrophy of breast: Secondary | ICD-10-CM

## 2021-07-09 NOTE — Telephone Encounter (Signed)
Patient called back and said that she forgot to mention to Dr. Marla Roe that she has had a boil form under her left arm. She was unsure if she should let [facility] know or if she should go to her primary care provider?  I instructed patient to upload images on mychart if possible in the meantime.  Please follow up with patient.

## 2021-07-09 NOTE — Progress Notes (Signed)
The patient is a 50 year old female here for follow-up after undergoing bilateral breast reduction.  Overall she is doing really well.  She has a little bit of maceration at the inframammary fold.  I took the Steri-Strip off on each side and put a dry gauze.  At the drain site she can use a little bit of Vaseline at night and then just dry gauze at the inframammary fold.  Continue with the sports bra.  She can lift up to but not more than 15 pounds until the end of the month.  And I would like to see her back in 2 to 3 weeks.  She is very pleased with her results and doing really well.  Pictures were obtained of the patient and placed in the chart with the patient's or guardian's permission.

## 2021-07-10 NOTE — Telephone Encounter (Signed)
Returned patients call. She has a boil under right armpit (note-correction from original phone call message). Advised her to call her P/C. Patient understood and agreed with plan.

## 2021-07-16 ENCOUNTER — Encounter: Payer: Self-pay | Admitting: Internal Medicine

## 2021-07-16 ENCOUNTER — Ambulatory Visit: Payer: 59 | Admitting: Internal Medicine

## 2021-07-16 ENCOUNTER — Other Ambulatory Visit: Payer: Self-pay

## 2021-07-16 VITALS — BP 126/68 | HR 86 | Resp 18 | Ht 63.0 in | Wt 225.6 lb

## 2021-07-16 DIAGNOSIS — I10 Essential (primary) hypertension: Secondary | ICD-10-CM | POA: Diagnosis not present

## 2021-07-16 DIAGNOSIS — L0292 Furuncle, unspecified: Secondary | ICD-10-CM

## 2021-07-16 DIAGNOSIS — R7989 Other specified abnormal findings of blood chemistry: Secondary | ICD-10-CM | POA: Diagnosis not present

## 2021-07-16 LAB — COMPREHENSIVE METABOLIC PANEL
ALT: 27 U/L (ref 0–35)
AST: 22 U/L (ref 0–37)
Albumin: 3.9 g/dL (ref 3.5–5.2)
Alkaline Phosphatase: 67 U/L (ref 39–117)
BUN: 10 mg/dL (ref 6–23)
CO2: 31 mEq/L (ref 19–32)
Calcium: 9.7 mg/dL (ref 8.4–10.5)
Chloride: 101 mEq/L (ref 96–112)
Creatinine, Ser: 0.68 mg/dL (ref 0.40–1.20)
GFR: 100.83 mL/min (ref >60.00)
Glucose, Bld: 73 mg/dL (ref 70–99)
Potassium: 3.7 mEq/L (ref 3.5–5.1)
Sodium: 141 mEq/L (ref 135–145)
Total Bilirubin: 0.4 mg/dL (ref 0.2–1.2)
Total Protein: 7.3 g/dL (ref 6.0–8.3)

## 2021-07-16 LAB — CBC
HCT: 37.8 % (ref 36.0–46.0)
Hemoglobin: 12.3 g/dL (ref 12.0–15.0)
MCHC: 32.5 g/dL (ref 30.0–36.0)
MCV: 80.6 fl (ref 78.0–100.0)
Platelets: 183 10*3/uL (ref 150.0–400.0)
RBC: 4.68 Mil/uL (ref 3.87–5.11)
RDW: 15.1 % (ref 11.5–15.5)
WBC: 4.3 10*3/uL (ref 4.0–10.5)

## 2021-07-16 LAB — LIPID PANEL
Cholesterol: 171 mg/dL (ref 0–200)
HDL: 59.7 mg/dL (ref >39.00)
LDL Cholesterol: 95 mg/dL (ref 0–99)
NonHDL: 111.17
Total CHOL/HDL Ratio: 3
Triglycerides: 80 mg/dL (ref 0.0–149.0)
VLDL: 16 mg/dL (ref 0.0–40.0)

## 2021-07-16 LAB — TSH: TSH: 0.58 u[IU]/mL (ref 0.35–5.50)

## 2021-07-16 MED ORDER — MUPIROCIN CALCIUM 2 % EX CREA: 1.0000 "application " | TOPICAL_CREAM | Freq: Two times a day (BID) | CUTANEOUS | 3 refills | Status: DC

## 2021-07-16 MED ORDER — HIBICLENS 4 % EX LIQD: Freq: Every day | CUTANEOUS | 3 refills | Status: DC | PRN

## 2021-07-16 NOTE — Patient Instructions (Signed)
We have refilled the cream and sent in chlorhexidine to use to wash under the armpits 2-3 times a week ongoing to prevent boils.  We are checking the labs today.

## 2021-07-16 NOTE — Assessment & Plan Note (Signed)
Rx mupirocin cream to use topically on the area until healed as well as chlorhexidine wash (use 3 times weekly) to help prevent recurrence.

## 2021-07-16 NOTE — Assessment & Plan Note (Signed)
Checking TSH today as not checked in some time.

## 2021-07-16 NOTE — Progress Notes (Signed)
° °  Subjective:   Patient ID: [name], female    DOB: [birth year], 50 y.o.   MRN: [id number]  HPI The patient is a 50 YO female coming in for boil under R armpit.  Review of Systems  Constitutional: Negative.   HENT: Negative.    Eyes: Negative.   Respiratory:  Negative for cough, chest tightness and shortness of breath.   Cardiovascular:  Negative for chest pain, palpitations and leg swelling.  Gastrointestinal:  Negative for abdominal distention, abdominal pain, constipation, diarrhea, nausea and vomiting.  Musculoskeletal: Negative.   Skin:  Positive for wound.  Neurological: Negative.   Psychiatric/Behavioral: Negative.     Objective:  Physical Exam Constitutional:      Appearance: She is well-developed.  HENT:     Head: Normocephalic and atraumatic.  Cardiovascular:     Rate and Rhythm: Normal rate and regular rhythm.  Pulmonary:     Effort: Pulmonary effort is normal. No respiratory distress.     Breath sounds: Normal breath sounds. No wheezing or rales.  Abdominal:     General: Bowel sounds are normal. There is no distension.     Palpations: Abdomen is soft.     Tenderness: There is no abdominal tenderness. There is no rebound.  Musculoskeletal:     Cervical back: Normal range of motion.  Skin:    General: Skin is warm and dry.     Comments: Resolving abscess right axillary, no current signs of infection  Neurological:     Mental Status: She is alert and oriented to person, place, and time.     Coordination: Coordination normal.    Vitals:   07/16/21 0931  BP: 126/68  Pulse: 86  Resp: 18  SpO2: 98%  Weight: 225 lb 9.6 oz (102.3 kg)  Height: 5\' 3"  (1.6 m)    This visit occurred during the SARS-CoV-2 public health emergency.  Safety protocols were in place, including screening questions prior to the visit, additional usage of staff PPE, and extensive cleaning of exam room while observing appropriate contact time as indicated for disinfecting solutions.    Assessment & Plan:

## 2021-07-16 NOTE — Assessment & Plan Note (Signed)
Checking labs as none in some time today. BP at goal. Adjust olmesartan/hctz/amlodipine 40/12.5/5 mg daily as needed.

## 2021-07-17 ENCOUNTER — Telehealth: Payer: Self-pay | Admitting: *Deleted

## 2021-07-17 NOTE — Telephone Encounter (Signed)
Received on (07/09/21) via of fax DME Standard Written Order from [facility].  Requesting signature and return.  Given to provider to sign.    DME Standard Written Order signed and faxed back to [facility].  Confirmation received and copy scanned into the chart.//AB/CMA

## 2021-07-17 NOTE — Telephone Encounter (Signed)
Received on (07/10/21) via of fax Revised DME Standard Written Order from [facility] requesting to be signed and resend.  DME Standard Written Order signed and faxed back to [facility].  Confirmation received and copy scanned into the chart.//AB/CMA

## 2021-07-18 ENCOUNTER — Other Ambulatory Visit: Payer: Self-pay | Admitting: Internal Medicine

## 2021-07-30 ENCOUNTER — Encounter: Payer: Self-pay | Admitting: Plastic Surgery

## 2021-07-30 ENCOUNTER — Other Ambulatory Visit: Payer: Self-pay

## 2021-07-30 ENCOUNTER — Ambulatory Visit (INDEPENDENT_AMBULATORY_CARE_PROVIDER_SITE_OTHER): Payer: 59 | Admitting: Plastic Surgery

## 2021-07-30 DIAGNOSIS — N62 Hypertrophy of breast: Secondary | ICD-10-CM

## 2021-07-30 NOTE — Progress Notes (Signed)
° °  Subjective:    Patient ID: [name], female    DOB: 01/27/1971, 50 y.o.   MRN: [id number]  The patient is a 50 year old female here for follow-up after undergoing a breast reduction several weeks ago.  Overall she is doing extremely well and has had a wonderful result.  She still has a little bit of swelling but things are settling down very nicely.  The Steri-Strips are in place.  On her last exam she had a little bit of wetness underneath the breast at the inframammary fold.  That looks really good today.  No sign of hematoma or seroma.  No sign of infection.  Today she talked about the possibility of abdominal surgery to remove some of the excess tissue.  We talked about some ways to reduce her weight.   Review of Systems  Constitutional:  Positive for activity change. Negative for appetite change.  Eyes: Negative.   Respiratory: Negative.    Cardiovascular: Negative.   Endocrine: Negative.   Genitourinary: Negative.       Objective:   Physical Exam Constitutional:      Appearance: Normal appearance.  Cardiovascular:     Rate and Rhythm: Normal rate.     Pulses: Normal pulses.  Pulmonary:     Effort: Pulmonary effort is normal.  Skin:    Capillary Refill: Capillary refill takes less than 2 seconds.     Coloration: Skin is not jaundiced.     Findings: Bruising present.  Neurological:     Mental Status: She is alert and oriented to person, place, and time.  Psychiatric:        Mood and Affect: Mood normal.        Behavior: Behavior normal.        Thought Content: Thought content normal.        Assessment & Plan:     ICD-10-CM   1. Symptomatic mammary hypertrophy  N62       Pictures were obtained of the patient and placed in the chart with the patient's or guardian's permission.  Referral made to the healthy weight [facility] wellness center.  Continue with the sports bra.  Can increase lifting to 20 pounds.  Follow-up in 3 to 4 weeks.

## 2021-08-05 ENCOUNTER — Other Ambulatory Visit: Payer: Self-pay | Admitting: Internal Medicine

## 2021-08-20 NOTE — Progress Notes (Signed)
Patient is a 51 year old female with PMH of macromastia s/p bilateral breast reduction with liposuction performed 06/19/2021 with Dr. Marla Roe who presents to clinic for postoperative follow-up.  Patient was last seen here in clinic on 07/30/2021.  At that time, exam was entirely reassuring.  She was doing well and pleased with cosmetic outcome.  She was referred to the healthy weight and wellness center and plan was for follow-up in 3 to 4 weeks.  Today, patient is doing well.  She is approximately 8 weeks postop, but her Steri-Strips had remained firmly intact.  She states that they were starting to smell a little bit.  They removed were here in clinic at bedside without complication or difficulty.  The majority of her incisions have healed nicely, no appreciable incisional scarring noted.  However, she likely had a moderate wound at inferior T-zone left breast that has healed nicely.  There is now only a small wound present, approximately 0.25 x 0.25 cm.  No purulent drainage noted.  She denies any worsening pain symptoms or fevers.  Physical exam is reassuring.  Breasts with good shape and symmetry.  She thought that perhaps her left side was bigger, but then thought that maybe the right side looked bigger in the picture.  Overall, seems quite symmetric on my end.  There is a small 0.25 x 0.25 cm wound appreciated over inferior T-zone left breast.  No other wounds or areas of dehiscence noted.  Mild area of hypertrophic scarring at right drain tube insertion site.  Recommending Mederma or similar scar mitigation cream over her incision sites throughout.  Aside from avoiding breast submersion in hot tubs/pools/ocean, no other specific restrictions.  Recommending Vaseline gauze over the small wound, suspected and will heal nicely over the course the next few weeks.  In terms of activity, no specific restrictions and she can lift as tolerated.  She is overall very pleased with the outcome.  She has been  doing a great job with her recovery.  No specific follow-up needed, but she can call the office should she develop any questions or concerns.  Picture(s) obtained of the patient and placed in the chart were with the patient's or guardian's permission.

## 2021-08-21 ENCOUNTER — Ambulatory Visit (INDEPENDENT_AMBULATORY_CARE_PROVIDER_SITE_OTHER): Payer: 59 | Admitting: Physician Assistant

## 2021-08-21 ENCOUNTER — Telehealth: Payer: Self-pay

## 2021-08-21 ENCOUNTER — Other Ambulatory Visit: Payer: Self-pay

## 2021-08-21 DIAGNOSIS — Z9889 Other specified postprocedural states: Secondary | ICD-10-CM

## 2021-08-24 ENCOUNTER — Encounter: Payer: Self-pay | Admitting: Plastic Surgery

## 2021-09-11 ENCOUNTER — Telehealth: Payer: Self-pay

## 2021-09-11 NOTE — Telephone Encounter (Signed)
Returned patients call. She has been having pain on the right side where the tube was removed since her last office visit, feels something sticking out as well. It is also uncomfortable getting out of the bed.. Advised to cover the area with gauze to prevent further irritation. She would like to been seen as soon as possible. Will check the schedule and call her back.

## 2021-09-11 NOTE — Telephone Encounter (Signed)
Patient called she is having pain in her right breast where the drainage tubes were placed patient stated there is a knot in the area patient is requesting a call back with a appointment call back:[phone]

## 2021-09-12 NOTE — Telephone Encounter (Signed)
I have an opening Tuesday, 3:25 pm

## 2021-09-17 ENCOUNTER — Other Ambulatory Visit: Payer: Self-pay

## 2021-09-17 ENCOUNTER — Ambulatory Visit (INDEPENDENT_AMBULATORY_CARE_PROVIDER_SITE_OTHER): Payer: 59 | Admitting: Surgical

## 2021-09-17 DIAGNOSIS — Z9889 Other specified postprocedural states: Secondary | ICD-10-CM

## 2021-09-17 NOTE — Progress Notes (Signed)
51 year old female here for follow-up after bilateral breast reduction with Dr. Marla Roe on 06/19/2021.  She was last seen in the clinic on 08/21/2021, she was doing well.  She presents today with concerns over tenderness over the right drain site and right lateral breast incision.  She reports that she mostly notices the tenderness and pain with lifting and movement.  She reports "pulling" sensation.  Chaperone present on exam On exam bilateral NAC's are viable, bilateral breast incisions are intact and healing well.  I do not appreciate any specific areas of concern on exam, she does have some slightly thickened scarring, but no signs of keloiding or hypertrophic scarring noted.  No erythema or cellulitic changes.  No subcutaneous fluid collections noted palpation.  Recommend massaging the area of concern 1-2 times per day, can do this with moisturizing lotion or scar cream. Recommend following up as needed.  Call with questions or concerns.  Pictures were obtained of the patient and placed in the chart with the patient's or guardian's permission.

## 2021-10-01 ENCOUNTER — Other Ambulatory Visit: Payer: Self-pay | Admitting: Internal Medicine

## 2021-11-01 ENCOUNTER — Other Ambulatory Visit: Payer: Self-pay | Admitting: Internal Medicine

## 2021-11-01 NOTE — Telephone Encounter (Signed)
LOV: 07/16/21 ?Next OV not scheduled yet ?

## 2021-12-03 ENCOUNTER — Other Ambulatory Visit: Payer: Self-pay | Admitting: Internal Medicine

## 2021-12-03 ENCOUNTER — Telehealth: Payer: Self-pay | Admitting: *Deleted

## 2021-12-03 NOTE — Telephone Encounter (Signed)
Received on (10/23/21) via of fax DME Standard Written Order from [facility] requesting signature and return.  Given to provider to sign.   ? ?DME Standard Written Order signed and faxed back to [facility].  Confirmation received and copy scanned into the chart.//AB/CMA ?

## 2021-12-09 ENCOUNTER — Other Ambulatory Visit: Payer: Self-pay | Admitting: Internal Medicine

## 2021-12-10 NOTE — Telephone Encounter (Signed)
Check [place] registry last filled 11/03/2021.Marland KitchenJohny Williams ? ?

## 2021-12-12 ENCOUNTER — Other Ambulatory Visit: Payer: Self-pay | Admitting: Internal Medicine

## 2021-12-12 ENCOUNTER — Telehealth: Payer: Self-pay

## 2021-12-12 NOTE — Telephone Encounter (Addendum)
Pt is requesting a refill on: ?ALPRAZolam (XANAX) 0.5 MG tablet ? ?Pharmacy: ?[id number] [place] ?LOV 06/05/21 ?ROV 01/02/22 ? ?Pt states she takes 2 nightly ?

## 2021-12-13 ENCOUNTER — Encounter: Payer: Self-pay | Admitting: Internal Medicine

## 2021-12-13 ENCOUNTER — Ambulatory Visit: Payer: 59 | Admitting: Internal Medicine

## 2021-12-13 ENCOUNTER — Other Ambulatory Visit: Payer: Self-pay | Admitting: Obstetrics and Gynecology

## 2021-12-13 DIAGNOSIS — R140 Abdominal distension (gaseous): Secondary | ICD-10-CM | POA: Insufficient documentation

## 2021-12-13 DIAGNOSIS — R928 Other abnormal and inconclusive findings on diagnostic imaging of breast: Secondary | ICD-10-CM

## 2021-12-13 MED ORDER — MAGNESIUM CITRATE PO SOLN: 1.0000 | Freq: Once | ORAL | 1 refills | Status: AC

## 2021-12-13 MED ORDER — SENNA-DOCUSATE SODIUM 8.6-50 MG PO TABS: 1.0000 | ORAL_TABLET | Freq: Two times a day (BID) | ORAL | 0 refills | Status: DC

## 2021-12-13 NOTE — Telephone Encounter (Signed)
It was done.  Thanks °

## 2021-12-13 NOTE — Progress Notes (Signed)
? ?  Subjective:  ? ?Patient ID: [name], female    DOB: [birth year], 51 y.o.   MRN: [id number] ? ?HPI ?The patient is a 51 YO female having problems with constipation since surgery in the fall. Taking miralax daily without relief. No BM last 3-4 days. Several small BM 1 per day 2-3 days prior to that. Feels even when she is going not emptying well. Poor appetite today no nausea. Passing gas still. Drinking fluids fine and no stomach pain feels it is bloated.  ? ?Review of Systems  ?Constitutional: Negative.   ?HENT: Negative.    ?Eyes: Negative.   ?Respiratory:  Negative for cough, chest tightness and shortness of breath.   ?Cardiovascular:  Negative for chest pain, palpitations and leg swelling.  ?Gastrointestinal:  Positive for abdominal distention and constipation. Negative for abdominal pain, diarrhea, nausea and vomiting.  ?Musculoskeletal: Negative.   ?Skin: Negative.   ?Neurological: Negative.   ?Psychiatric/Behavioral: Negative.    ? ?Objective:  ?Physical Exam ?Constitutional:   ?   Appearance: She is well-developed. She is obese.  ?HENT:  ?   Head: Normocephalic and atraumatic.  ?Cardiovascular:  ?   Rate and Rhythm: Normal rate and regular rhythm.  ?Pulmonary:  ?   Effort: Pulmonary effort is normal. No respiratory distress.  ?   Breath sounds: Normal breath sounds. No wheezing or rales.  ?Abdominal:  ?   General: Bowel sounds are normal. There is distension.  ?   Palpations: Abdomen is soft. There is no mass.  ?   Tenderness: There is no abdominal tenderness. There is no rebound.  ?   Hernia: No hernia is present.  ?   Comments: BS normal and non-tender  ?Musculoskeletal:  ?   Cervical back: Normal range of motion.  ?Skin: ?   General: Skin is warm and dry.  ?Neurological:  ?   Mental Status: She is alert and oriented to person, place, and time.  ?   Coordination: Coordination normal.  ? ? ?Vitals:  ? 12/13/21 1102  ?BP: 122/88  ?Pulse: 83  ?Resp: 18  ?SpO2: 99%  ?Weight: 218 lb 3.2 oz (99 kg)   ?Height: '5\' 3"'$  (1.6 m)  ? ? ?Assessment & Plan:  ? ?

## 2021-12-13 NOTE — Patient Instructions (Addendum)
We have sent in senokot-d to take 1 pill twice a day for the next 2 weeks to help you go to the bathroom. ? ?We have also sent in magnesium citrate to drink a bottle today and then another bottle tomorrow morning if you have not gone to the bathroom. ? ?Keep taking the miralax daily as well. ? ? ?

## 2021-12-13 NOTE — Assessment & Plan Note (Addendum)
No signs of obstruction on exam with non-tender and normal BS. Suspect moderate to severe constipation. Rx magnesium citrate 1 bottle today and then 1 tomorrow. Also keep miralax 1 cap daily ongoing. Add senokot-d (also prescribed) 1 pill twice a day for next 1-2 weeks for helping her empty. Seek care with abdominal pain, nausea/vomiting lack of BM with treatment. ?

## 2021-12-20 ENCOUNTER — Encounter: Payer: Self-pay | Admitting: Nurse Practitioner

## 2021-12-20 ENCOUNTER — Ambulatory Visit: Payer: 59 | Admitting: Nurse Practitioner

## 2021-12-20 ENCOUNTER — Telehealth: Payer: Self-pay | Admitting: Internal Medicine

## 2021-12-20 VITALS — BP 116/79 | HR 85 | Ht 63.0 in | Wt 217.3 lb

## 2021-12-20 DIAGNOSIS — K5900 Constipation, unspecified: Secondary | ICD-10-CM

## 2021-12-20 DIAGNOSIS — R42 Dizziness and giddiness: Secondary | ICD-10-CM | POA: Insufficient documentation

## 2021-12-20 DIAGNOSIS — R002 Palpitations: Secondary | ICD-10-CM | POA: Diagnosis not present

## 2021-12-20 NOTE — Assessment & Plan Note (Signed)
Etiology unclear.  For now recommend she reduce Ozempic dose from 2 mg/week to 1 mg/week for 2-3 doses to see if this improves her constipation.  Also recommend she continue using her stool softeners.  We will get ultrasound of abdomen for further evaluation, if ultrasound normal would recommend evaluation with gastroenterology.  Referral to GI ordered today in anticipation that she may need consultation with specialist.

## 2021-12-20 NOTE — Telephone Encounter (Signed)
PT calls today in regards to ongoing symptoms from last appointment. PT had visited [place] with symptoms of bloating. PT is going back to the bathroom again regularly and has been taking medication as prescribed but still is experiencing this bloating. PT would like to know next plan of action.  PT suggesting a possible referral to GI or an MRI being done of the abdomen.  CB: [phone]

## 2021-12-20 NOTE — Progress Notes (Signed)
Established Patient Office Visit  Subjective   Patient ID: [name], female    DOB: 1971-06-16  Age: 51 y.o. MRN: [id number]  Chief Complaint  Patient presents with   Dizziness    Started yesterday feels as if the room spinns suddenly   Patient arrives today for acute visit for the above.  Dizziness: First episode occurred yesterday while she was standing up.  She had a repeat episode today when sitting down.  During the episode she starts to feel close to passing out, the room seems to feel like it is spinning around her, she starts experiencing cardiac palpitations, and feels hot and sweaty.  In both cases episode spontaneously resolved within about 10 minutes.  She denies chest pain or shortness of breath during the episode.  She does report some left leg intermittent numbness that has been occurring since after her breast reduction surgery this past November (approximately 6 months ago).  She denies other neurologic symptoms such as obvious double vision, weakness, other sensory changes.  She does report being under a lot of emotional stress related to recent change in her job as well as preparing to close on a house.  She has a history of hypertension but it is currently well controlled.  Last lipid panel showed LDL of 95.  She reports being on phentermine as prescribed by her bariatric provider.  She reports being on this for about a year.  She is also on Ozempic.  Constipation/bloating: She has been experiencing constipation and bloating since her breast reduction surgery about 6 months ago.  She has been taking MiraLAX, increasing fiber in her diet, avoiding dairy products (she is lactose intolerant).  She has also taken magnesium citrate as needed.  She is having regular stools however they are small and she does not feel like she is emptying her bowels fully when she has a stool.  She reports diagnostic colonoscopy back in 2014 which found evidence of colitis, and was ultimately  determined to be related to food poisoning.  She denies any visible blood in her stool.  She denies any abdominal pain.    Review of Systems  Constitutional:  Negative for fever.  HENT:  Negative for ear pain.   Eyes:  Negative for blurred vision and double vision.  Respiratory:  Negative for shortness of breath.   Cardiovascular:  Positive for palpitations. Negative for chest pain.  Gastrointestinal:  Positive for constipation. Negative for abdominal pain, blood in stool, melena, nausea and vomiting.  Neurological:  Positive for sensory change (improves with walking on it) and weakness (improves with walking on it).     Objective:     BP 116/79 (BP Location: Left Arm, Patient Position: Sitting, Cuff Size: Normal)   Pulse 85   Ht '5\' 3"'$  (1.6 m)   Wt 217 lb 4.8 oz (98.6 kg)   SpO2 99%   BMI 38.49 kg/m  BP Readings from Last 3 Encounters:  12/20/21 116/79  12/13/21 122/88  07/16/21 126/68   Wt Readings from Last 3 Encounters:  12/20/21 217 lb 4.8 oz (98.6 kg)  12/13/21 218 lb 3.2 oz (99 kg)  07/16/21 225 lb 9.6 oz (102.3 kg)      Physical Exam Vitals reviewed.  Constitutional:      General: She is not in acute distress.    Appearance: Normal appearance.  HENT:     Head: Normocephalic and atraumatic.  Neck:     Vascular: No carotid bruit.  Cardiovascular:  Rate and Rhythm: Normal rate and regular rhythm.     Pulses: Normal pulses.     Heart sounds: Normal heart sounds.  Pulmonary:     Effort: Pulmonary effort is normal.     Breath sounds: Normal breath sounds.  Abdominal:     General: Abdomen is protuberant. Bowel sounds are decreased. There is no distension.     Palpations: Abdomen is soft. There is no mass.     Tenderness: There is no abdominal tenderness.  Skin:    General: Skin is warm and dry.  Neurological:     General: No focal deficit present.     Mental Status: She is alert and oriented to person, place, and time.     Cranial Nerves: Cranial nerves  2-12 are intact.     Sensory: Sensation is intact.     Motor: Motor function is intact.     Coordination: Coordination is intact.     Gait: Gait is intact.  Psychiatric:        Mood and Affect: Mood normal.        Behavior: Behavior normal.        Judgment: Judgment normal.   EKG: Normal sinus rhythm, heart rate of 84, no ST segment abnormalities.  No results found for any visits on 12/20/21.    The 10-year ASCVD risk score (Arnett DK, et al., 2019) is: 1.1%    Assessment & Plan:   Problem List Items Addressed This Visit       Other   Palpitations - Primary    Acute, intermittent, associated with dizziness.  Possible etiologies include cardiac arrhythmia, stress, side effect of phentermine, vertigo, abnormal thyroid functioning, migraine, stroke.  Neurologic exam completely intact today during visit.  Thus I feel stroke or TIA is less likely.  Concern for possible paroxysmal arrhythmia such as atrial fibrillation.  Blood work ordered for further evaluation including CBC, CMP, and thyroid panel.  Will refer to cardiology for further assistance with evaluating for possible arrhythmia during episodes.  Further recommendations may be made based upon lab work results.  Encourage patient to discuss trialing drug holiday from phentermine to see if this results in improvement in her symptoms.  She tells me she will consider this.      Relevant Orders   EKG 12-Lead   CBC   Comprehensive metabolic panel   TSH   T3, free   T4, free   Ambulatory referral to Cardiology   Dizziness    Acute, intermittent, associated with cardiac palpitations.  Possible etiologies include cardiac arrhythmia, stress, side effect of phentermine, vertigo, abnormal thyroid functioning, migraine, stroke.  Neurologic exam completely intact today during visit.  Thus I feel stroke or TIA is less likely.  Concern for possible paroxysmal arrhythmia such as atrial fibrillation.  Blood work ordered for further evaluation  including CBC, CMP, and thyroid panel.  Will refer to cardiology for further assistance with evaluating for possible arrhythmia during episodes.  Further recommendations may be made based upon lab work results.       Relevant Orders   CBC   Comprehensive metabolic panel   TSH   T3, free   T4, free   Ambulatory referral to Cardiology   Constipation    Etiology unclear.  For now recommend she reduce Ozempic dose from 2 mg/week to 1 mg/week for 2-3 doses to see if this improves her constipation.  Also recommend she continue using her stool softeners.  We will get ultrasound of abdomen for  further evaluation, if ultrasound normal would recommend evaluation with gastroenterology.  Referral to GI ordered today in anticipation that she may need consultation with specialist.       Relevant Orders   US Abdomen Complete   Ambulatory referral to Gastroenterology    Return for Follow-up as scheduled in 2 weeks.  I spent approximately 65 minutes today by evaluating patient as well as ordering tests, chart review, and EKG interpretation.   Ailene Ards, NP

## 2021-12-20 NOTE — Assessment & Plan Note (Signed)
Acute, intermittent, associated with cardiac palpitations.  Possible etiologies include cardiac arrhythmia, stress, side effect of phentermine, vertigo, abnormal thyroid functioning, migraine, stroke.  Neurologic exam completely intact today during visit.  Thus I feel stroke or TIA is less likely.  Concern for possible paroxysmal arrhythmia such as atrial fibrillation.  Blood work ordered for further evaluation including CBC, CMP, and thyroid panel.  Will refer to cardiology for further assistance with evaluating for possible arrhythmia during episodes.  Further recommendations may be made based upon lab work results.

## 2021-12-20 NOTE — Assessment & Plan Note (Signed)
Acute, intermittent, associated with dizziness.  Possible etiologies include cardiac arrhythmia, stress, side effect of phentermine, vertigo, abnormal thyroid functioning, migraine, stroke.  Neurologic exam completely intact today during visit.  Thus I feel stroke or TIA is less likely.  Concern for possible paroxysmal arrhythmia such as atrial fibrillation.  Blood work ordered for further evaluation including CBC, CMP, and thyroid panel.  Will refer to cardiology for further assistance with evaluating for possible arrhythmia during episodes.  Further recommendations may be made based upon lab work results.  Encourage patient to discuss trialing drug holiday from phentermine to see if this results in improvement in her symptoms.  She tells me she will consider this.

## 2021-12-22 ENCOUNTER — Encounter: Payer: Self-pay | Admitting: Internal Medicine

## 2021-12-24 ENCOUNTER — Ambulatory Visit
Admission: RE | Admit: 2021-12-24 | Discharge: 2021-12-24 | Disposition: A | Discharge status: Home / Self Care | Payer: 59 | Source: Ambulatory Visit | Attending: Obstetrics and Gynecology | Admitting: Obstetrics and Gynecology

## 2021-12-24 ENCOUNTER — Ambulatory Visit: Payer: 59

## 2021-12-24 ENCOUNTER — Other Ambulatory Visit (INDEPENDENT_AMBULATORY_CARE_PROVIDER_SITE_OTHER): Payer: 59

## 2021-12-24 DIAGNOSIS — R002 Palpitations: Secondary | ICD-10-CM

## 2021-12-24 DIAGNOSIS — R42 Dizziness and giddiness: Secondary | ICD-10-CM | POA: Diagnosis not present

## 2021-12-24 DIAGNOSIS — K5900 Constipation, unspecified: Secondary | ICD-10-CM

## 2021-12-24 DIAGNOSIS — R928 Other abnormal and inconclusive findings on diagnostic imaging of breast: Secondary | ICD-10-CM

## 2021-12-24 LAB — COMPREHENSIVE METABOLIC PANEL
ALT: 14 U/L (ref 0–35)
AST: 20 U/L (ref 0–37)
Albumin: 4.2 g/dL (ref 3.5–5.2)
Alkaline Phosphatase: 48 U/L (ref 39–117)
BUN: 11 mg/dL (ref 6–23)
CO2: 29 mEq/L (ref 19–32)
Calcium: 10 mg/dL (ref 8.4–10.5)
Chloride: 102 mEq/L (ref 96–112)
Creatinine, Ser: 0.68 mg/dL (ref 0.40–1.20)
GFR: 100.52 mL/min (ref >60.00)
Glucose, Bld: 87 mg/dL (ref 70–99)
Potassium: 3.7 mEq/L (ref 3.5–5.1)
Sodium: 139 mEq/L (ref 135–145)
Total Bilirubin: 0.3 mg/dL (ref 0.2–1.2)
Total Protein: 7.7 g/dL (ref 6.0–8.3)

## 2021-12-24 LAB — CBC
HCT: 41.1 % (ref 36.0–46.0)
Hemoglobin: 13.7 g/dL (ref 12.0–15.0)
MCHC: 33.3 g/dL (ref 30.0–36.0)
MCV: 82.1 fl (ref 78.0–100.0)
Platelets: 149 10*3/uL — ABNORMAL LOW (ref 150.0–400.0)
RBC: 5 Mil/uL (ref 3.87–5.11)
RDW: 14.7 % (ref 11.5–15.5)
WBC: 4.7 10*3/uL (ref 4.0–10.5)

## 2021-12-24 LAB — TSH: TSH: 0.79 u[IU]/mL (ref 0.35–5.50)

## 2021-12-24 LAB — T3, FREE: T3, Free: 2.9 pg/mL (ref 2.3–4.2)

## 2021-12-24 LAB — T4, FREE: Free T4: 0.71 ng/dL (ref 0.60–1.60)

## 2021-12-24 NOTE — Telephone Encounter (Signed)
Message was sent to Natasha Mead, Please stop Ozempic and see me in 10 days. Thanks, AP

## 2021-12-25 ENCOUNTER — Ambulatory Visit
Admission: RE | Admit: 2021-12-25 | Discharge: 2021-12-25 | Disposition: A | Discharge status: Home / Self Care | Payer: 59 | Source: Ambulatory Visit | Attending: Nurse Practitioner | Admitting: Nurse Practitioner

## 2021-12-25 NOTE — Telephone Encounter (Signed)
Per chart pt made appt for 01/02/22.../l,mb

## 2021-12-26 ENCOUNTER — Ambulatory Visit: Payer: 59 | Admitting: Cardiology

## 2021-12-26 NOTE — Progress Notes (Deleted)
Cardiology Office Note:    Date:  12/26/2021   ID:  [name], DOB 16-Jan-1971, MRN [id number]  PCP:  Cassandria Anger, MD  Cardiologist:  None  Electrophysiologist:  None   Referring MD: Cassandria Anger, MD   No chief complaint on file. ***  History of Present Illness:    [name] is a 51 y.o. female with a hx of asthma, hypertension, fibromyalgia, prediabetes who was referred by Dr. Alain Marion for evaluation of palpitations.  Past Medical History:  Diagnosis Date   Allergy    rhinitis   Anemia, unspecified    Asthma    asthma   Colitis    Fibromyalgia    Hypertension    Leiomyoma of uterus, unspecified    Meralgia paresthetica    Nausea alone    Obesity    Other B-complex deficiencies    Pre-diabetes    Umbilical hernia    Unspecified vitamin D deficiency     Past Surgical History:  Procedure Laterality Date   fibriodidectomy  2009   MYOMECTOMY  2004   x 2    Current Medications: No outpatient medications have been marked as taking for the 12/26/21 encounter (Appointment) with Donato Heinz, MD.     Allergies:   Patient has no known allergies.   Social History   Socioeconomic History   Marital status: Single    Spouse name: Not on file   Number of children: 0   Years of education: Not on file   Highest education level: Not on file  Occupational History   Occupation: Environmental health practitioner: [employer]  Tobacco Use   Smoking status: Never   Smokeless tobacco: Never  Vaping Use   Vaping Use: Never used  Substance and Sexual Activity   Alcohol use: No   Drug use: No   Sexual activity: Not on file  Other Topics Concern   Not on file  Social History Narrative   Not on file   Social Determinants of Health   Financial Resource Strain: Not on file  Food Insecurity: Not on file  Transportation Needs: Not on file  Physical Activity: Not on file  Stress: Not on file  Social Connections: Not on file     Family  History: The patient's ***family history includes Alcoholism in her father; High blood pressure in her father; Hypertension in an other family member.  ROS:   Please see the history of present illness.    *** All other systems reviewed and are negative.  EKGs/Labs/Other Studies Reviewed:    The following studies were reviewed today: ***  EKG:  EKG is *** ordered today.  The ekg ordered today demonstrates ***  Recent Labs: 12/24/2021: ALT 14; BUN 11; Creatinine, Ser 0.68; Hemoglobin 13.7; Platelets 149.0; Potassium 3.7; Sodium 139; TSH 0.79  Recent Lipid Panel    Component Value Date/Time   CHOL 171 07/16/2021 0945   TRIG 80.0 07/16/2021 0945   HDL 59.70 07/16/2021 0945   CHOLHDL 3 07/16/2021 0945   VLDL 16.0 07/16/2021 0945   [place] 95 07/16/2021 0945    Physical Exam:    VS:  There were no vitals taken for this visit.    Wt Readings from Last 3 Encounters:  12/20/21 217 lb 4.8 oz (98.6 kg)  12/13/21 218 lb 3.2 oz (99 kg)  07/16/21 225 lb 9.6 oz (102.3 kg)     GEN: *** Well nourished, well developed in no acute distress HEENT: Normal NECK: No JVD;  No carotid bruits LYMPHATICS: No lymphadenopathy CARDIAC: ***RRR, no murmurs, rubs, gallops RESPIRATORY:  Clear to auscultation without rales, wheezing or rhonchi  ABDOMEN: Soft, non-tender, non-distended MUSCULOSKELETAL:  No edema; No deformity  SKIN: Warm and dry NEUROLOGIC:  Alert and oriented x 3 PSYCHIATRIC:  Normal affect   ASSESSMENT:    No diagnosis found. PLAN:    Palpitations: -Phentermine***  Hypertension: On olmesartan-amlodipine-hydrochlorothiazide 40-5-12.5 mg daily  Obesity: On phentermine and Ozempic  RTC in***   Medication Adjustments/Labs and Tests Ordered: Current medicines are reviewed at length with the patient today.  Concerns regarding medicines are outlined above.  No orders of the defined types were placed in this encounter.  No orders of the defined types were placed in this  encounter.   There are no Patient Instructions on file for this visit.   Signed, Donato Heinz, MD  12/26/2021 6:07 AM    [place]

## 2022-01-02 ENCOUNTER — Encounter: Payer: Self-pay | Admitting: Internal Medicine

## 2022-01-02 ENCOUNTER — Ambulatory Visit: Payer: 59 | Admitting: Internal Medicine

## 2022-01-02 VITALS — BP 110/80 | HR 93 | Temp 99.0°F | Ht 63.0 in | Wt 220.0 lb

## 2022-01-02 DIAGNOSIS — K5651 Intestinal adhesions [bands], with partial obstruction: Secondary | ICD-10-CM

## 2022-01-02 DIAGNOSIS — R1084 Generalized abdominal pain: Secondary | ICD-10-CM

## 2022-01-02 DIAGNOSIS — E669 Obesity, unspecified: Secondary | ICD-10-CM | POA: Diagnosis not present

## 2022-01-02 DIAGNOSIS — K120 Recurrent oral aphthae: Secondary | ICD-10-CM

## 2022-01-02 DIAGNOSIS — R140 Abdominal distension (gaseous): Secondary | ICD-10-CM | POA: Diagnosis not present

## 2022-01-02 MED ORDER — TRIAMCINOLONE ACETONIDE 0.1 % MT PSTE: 1.0000 "application " | PASTE | Freq: Three times a day (TID) | OROMUCOSAL | 1 refills | Status: DC

## 2022-01-02 MED ORDER — VALACYCLOVIR HCL 1 G PO TABS: 1000.0000 mg | ORAL_TABLET | Freq: Three times a day (TID) | ORAL | 1 refills | Status: DC

## 2022-01-02 NOTE — Progress Notes (Signed)
Subjective:  Patient ID: [name], female    DOB: 1971-05-14  Age: 51 y.o. MRN: [id number]  CC: No chief complaint on file.   HPI [name] presents for abd protruding C/o bloating Pt stopped Ozempic 1 mo ago R tongue ulcer - new [name] is being treated at [facility] Dr Alvester Chou (now on Phentermine)  Outpatient Medications Prior to Visit  Medication Sig Dispense Refill   ALPRAZolam (XANAX) 0.5 MG tablet TAKE 1 TO 2 TABLETS BY MOUTH EVERY DAY AT BEDTIME 60 tablet 0   Armodafinil 150 MG tablet TAKE 1 TABLET BY MOUTH DAILY 90 tablet 0   cefdinir (OMNICEF) 300 MG capsule Take 1 capsule (300 mg total) by mouth 2 (two) times daily. 20 capsule 0   cetirizine (ZYRTEC) 10 MG tablet Take 10 mg by mouth daily.     chlorhexidine (HIBICLENS) 4 % external liquid Apply topically daily as needed. Use 3/week 1000 mL 3   Cholecalciferol 25 MCG (1000 UT) tablet Take 1,000 Units by mouth daily.     cyclobenzaprine (FLEXERIL) 5 MG tablet TAKE 1 TABLET BY MOUTH THREE TIMES A DAY AS NEEDED FOR MUSCLE SPASMS 60 tablet 2   fluticasone (FLONASE) 50 MCG/ACT nasal spray SPRAY 2 SPRAYS INTO EACH NOSTRIL EVERY DAY 48 mL 0   mometasone-formoterol (DULERA) 200-5 MCG/ACT AERO Inhale 2 puffs into the lungs 2 (two) times daily. 13 g 5   montelukast (SINGULAIR) 10 MG tablet TAKE 1 TABLET BY MOUTH EVERYDAY AT BEDTIME 90 tablet 1   mupirocin cream (BACTROBAN) 2 % Apply 1 application topically 2 (two) times daily. 30 g 3   naproxen (NAPROSYN) 500 MG tablet TAKE 1 TABLET BY MOUTH TWICE A DAY AS NEEDED FOR MODERATE PAIN OR HEADACHE 60 tablet 3   norethindrone-ethinyl estradiol (FEMHRT LOW DOSE) 0.5-2.5 MG-MCG tablet Take 1 tablet by mouth daily.     Olmesartan-amLODIPine-HCTZ 40-5-12.5 MG TABS TAKE 1 TABLET BY MOUTH EVERY DAY 90 tablet 3   OZEMPIC, 1 MG/DOSE, 4 MG/3ML SOPN Inject 1 mg into the skin once a week.     phentermine (ADIPEX-P) 37.5 MG tablet Take 37.5 mg by mouth every morning.      Potassium Chloride ER 20 MEQ TBCR TAKE 1 TABLET BY MOUTH EVERY DAY 90 tablet 1   Prenatal Vit-Fe Fumarate-FA (M-NATAL PLUS) 27-1 MG TABS TAKE 1 TABLET BY MOUTH EVERY DAY 90 tablet 3   rizatriptan (MAXALT) 10 MG tablet TAKE 1 TABLET BY MOUTH ONCE AS NEEDED FOR UP TO 1 DOSE FOR MIGRAINE. MAY REPEAT IN 2 HOURS 12 tablet 5   sennosides-docusate sodium (SENOKOT-S) 8.6-50 MG tablet Take 1 tablet by mouth in the morning and at bedtime. 60 tablet 0   No facility-administered medications prior to visit.    ROS: Review of Systems  Constitutional:  Positive for fatigue. Negative for activity change, appetite change, chills and unexpected weight change.  HENT:  Negative for congestion, mouth sores and sinus pressure.   Eyes:  Negative for visual disturbance.  Respiratory:  Negative for cough and chest tightness.   Gastrointestinal:  Positive for abdominal distention, abdominal pain, constipation and nausea. Negative for anal bleeding, blood in stool and vomiting.  Genitourinary:  Negative for difficulty urinating, frequency and vaginal pain.  Musculoskeletal:  Positive for back pain. Negative for gait problem.  Skin:  Negative for pallor and rash.  Neurological:  Negative for dizziness, tremors, weakness, numbness and headaches.  Psychiatric/Behavioral:  Negative for confusion, sleep disturbance and suicidal ideas.  Objective:  BP 110/80 (BP Location: Left Arm, Patient Position: Sitting, Cuff Size: Large)   Pulse 93   Temp 99 F (37.2 C) (Oral)   Ht '5\' 3"'$  (1.6 m)   Wt 220 lb (99.8 kg)   SpO2 93%   BMI 38.97 kg/m   BP Readings from Last 3 Encounters:  01/02/22 110/80  12/20/21 116/79  12/13/21 122/88    Wt Readings from Last 3 Encounters:  01/02/22 220 lb (99.8 kg)  12/20/21 217 lb 4.8 oz (98.6 kg)  12/13/21 218 lb 3.2 oz (99 kg)    Physical Exam Constitutional:      General: She is not in acute distress.    Appearance: She is well-developed. She is obese.  HENT:     Head:  Normocephalic.     Right Ear: External ear normal.     Left Ear: External ear normal.     Nose: Nose normal.  Eyes:     General:        Right eye: No discharge.        Left eye: No discharge.     Conjunctiva/sclera: Conjunctivae normal.     Pupils: Pupils are equal, round, and reactive to light.  Neck:     Thyroid: No thyromegaly.     Vascular: No JVD.     Trachea: No tracheal deviation.  Cardiovascular:     Rate and Rhythm: Normal rate and regular rhythm.     Heart sounds: Normal heart sounds.  Pulmonary:     Effort: No respiratory distress.     Breath sounds: No stridor. No wheezing.  Abdominal:     General: Bowel sounds are normal. There is distension.     Palpations: Abdomen is soft. There is no mass.     Tenderness: There is no abdominal tenderness. There is no guarding or rebound.     Hernia: No hernia is present.  Musculoskeletal:        General: No tenderness.     Cervical back: Normal range of motion and neck supple. No rigidity.  Lymphadenopathy:     Cervical: No cervical adenopathy.  Skin:    Findings: No bruising, erythema or rash.  Neurological:     Cranial Nerves: No cranial nerve deficit.     Motor: No abnormal muscle tone.     Coordination: Coordination normal.     Deep Tendon Reflexes: Reflexes normal.  Psychiatric:        Behavior: Behavior normal.        Thought Content: Thought content normal.        Judgment: Judgment normal.  Protruding upper abd No HSM, no mass Ulcer on R tonge base     A total time of 45 minutes was spent preparing to see the patient, reviewing tests, x-rays, operative reports and other recent medical records.  Also, obtaining history and performing comprehensive physical exam.  Additionally, counseling the patient regarding the above listed issues.   Finally, documenting clinical information in the health records, coordination of care, educating the patient. It is a complex case.   Lab Results  Component Value Date   WBC  4.7 12/24/2021   HGB 13.7 12/24/2021   HCT 41.1 12/24/2021   PLT 149.0 (L) 12/24/2021   GLUCOSE 87 12/24/2021   CHOL 171 07/16/2021   TRIG 80.0 07/16/2021   HDL 59.70 07/16/2021   LDLCALC 95 07/16/2021   ALT 14 12/24/2021   AST 20 12/24/2021   NA 139 12/24/2021   K 3.7 12/24/2021  CL 102 12/24/2021   CREATININE 0.68 12/24/2021   BUN 11 12/24/2021   CO2 29 12/24/2021   TSH 0.79 12/24/2021   INR 0.9 RATIO 03/15/2007   HGBA1C 6.0 11/25/2018    US Abdomen Complete  Result Date: 12/25/2021 CLINICAL DATA:  Bloating EXAM: ABDOMEN ULTRASOUND COMPLETE COMPARISON:  None Available. FINDINGS: Gallbladder: No gallstones or wall thickening visualized. No sonographic Murphy sign noted by sonographer. Common bile duct: Diameter: 3 mm Liver: Inhomogeneous mildly increased echogenicity of the parenchyma with no focal mass identified. Portal vein is patent on color Doppler imaging with normal direction of blood flow towards the liver. IVC: No abnormality visualized. Pancreas: Visualized portion unremarkable. Spleen: Size and appearance within normal limits. Right Kidney: Length: 12.3 cm. Echogenicity within normal limits. No mass or hydronephrosis visualized. Left Kidney: Length: 11.8 cm. Echogenicity within normal limits. 2.2 cm anechoic cyst in the lower pole. No hydronephrosis visualized. Abdominal aorta: No aneurysm visualized. Other findings: None. IMPRESSION: 1. Abnormal appearance of the liver parenchyma suggesting hepatic steatosis and/or other hepatocellular disease. 2. Left renal cyst. Electronically Signed   By: Ofilia Neas M.D.   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    Dispense:  21 tablet    Refill:  1   triamcinolone (KENALOG) 0.1 % paste    Sig: Use as directed 1 application. in the mouth or throat 3 (three) times daily. On canker sores    Dispense:  5 g    Refill:  1      Follow-up: Return in about 2 weeks (around 01/16/2022) for a follow-up visit.  Walker Kehr, MD

## 2022-01-02 NOTE — Assessment & Plan Note (Signed)
R tongue

## 2022-01-02 NOTE — Assessment & Plan Note (Signed)
  [name] is being treated at [facility] Dr Alvester Chou (now on Phentermine)

## 2022-01-07 ENCOUNTER — Telehealth: Payer: Self-pay

## 2022-01-07 NOTE — Telephone Encounter (Signed)
Pt called to see about why she hasn't heard from CT. I called Centralized scheduling and connect the call so she could schedule that appt.  FYI

## 2022-01-12 ENCOUNTER — Ambulatory Visit (HOSPITAL_COMMUNITY)
Admission: RE | Admit: 2022-01-12 | Discharge: 2022-01-12 | Disposition: A | Discharge status: Home / Self Care | Payer: 59 | Source: Ambulatory Visit | Attending: Internal Medicine | Admitting: Internal Medicine

## 2022-01-12 ENCOUNTER — Ambulatory Visit (HOSPITAL_COMMUNITY): Payer: 59

## 2022-01-12 ENCOUNTER — Ambulatory Visit (HOSPITAL_COMMUNITY)
Admission: RE | Admit: 2022-01-12 | Discharge: 2022-01-12 | Disposition: A | Discharge status: Home / Self Care | Payer: 59 | Attending: Internal Medicine | Admitting: Internal Medicine

## 2022-01-12 DIAGNOSIS — R140 Abdominal distension (gaseous): Secondary | ICD-10-CM | POA: Diagnosis present

## 2022-01-12 DIAGNOSIS — K5651 Intestinal adhesions [bands], with partial obstruction: Secondary | ICD-10-CM

## 2022-01-12 DIAGNOSIS — R109 Unspecified abdominal pain: Secondary | ICD-10-CM | POA: Diagnosis present

## 2022-01-12 DIAGNOSIS — R1084 Generalized abdominal pain: Secondary | ICD-10-CM | POA: Insufficient documentation

## 2022-01-12 MED ORDER — IOHEXOL 300 MG/ML  SOLN
100.0000 mL | Freq: Once | INTRAMUSCULAR | Status: AC | PRN
  Administered 2022-01-12: 100 mL via INTRAVENOUS

## 2022-01-12 NOTE — Progress Notes (Deleted)
Cardiology Office Note:    Date:  01/12/2022   ID:  [name], DOB September 25, 1970, MRN [id number]  PCP:  Vanessa Anger, MD  Cardiologist:  None  Electrophysiologist:  None   Referring MD: Vanessa Anger, MD   No chief complaint on file. ***  History of Present Illness:    [name] is a 51 y.o. female with a hx of asthma, fibromyalgia, hypertension, obesity, prediabetes who is referred by Dr. Alain Williams for evaluation of palpitations.  Past Medical History:  Diagnosis Date   Allergy    rhinitis   Anemia, unspecified    Asthma    asthma   Colitis    Fibromyalgia    Hypertension    Leiomyoma of uterus, unspecified    Meralgia paresthetica    Nausea alone    Obesity    Other B-complex deficiencies    Pre-diabetes    Umbilical hernia    Unspecified vitamin D deficiency     Past Surgical History:  Procedure Laterality Date   fibriodidectomy  2009   MYOMECTOMY  2004   x 2    Current Medications: No outpatient medications have been marked as taking for the 01/13/22 encounter (Appointment) with Donato Heinz, MD.     Allergies:   Patient has no known allergies.   Social History   Socioeconomic History   Marital status: Single    Spouse name: Not on file   Number of children: 0   Years of education: Not on file   Highest education level: Not on file  Occupational History   Occupation: Environmental health practitioner: [employer]  Tobacco Use   Smoking status: Never   Smokeless tobacco: Never  Vaping Use   Vaping Use: Never used  Substance and Sexual Activity   Alcohol use: No   Drug use: No   Sexual activity: Not on file  Other Topics Concern   Not on file  Social History Narrative   Not on file   Social Determinants of Health   Financial Resource Strain: Not on file  Food Insecurity: Not on file  Transportation Needs: Not on file  Physical Activity: Not on file  Stress: Not on file  Social Connections: Not on file      Family History: The patient's ***family history includes Alcoholism in her father; High blood pressure in her father; Hypertension in an other family member.  ROS:   Please see the history of present illness.    *** All other systems reviewed and are negative.  EKGs/Labs/Other Studies Reviewed:    The following studies were reviewed today: ***  EKG:  EKG is *** ordered today.  The ekg ordered today demonstrates ***  Recent Labs: 12/24/2021: ALT 14; BUN 11; Creatinine, Ser 0.68; Hemoglobin 13.7; Platelets 149.0; Potassium 3.7; Sodium 139; TSH 0.79  Recent Lipid Panel    Component Value Date/Time   CHOL 171 07/16/2021 0945   TRIG 80.0 07/16/2021 0945   HDL 59.70 07/16/2021 0945   CHOLHDL 3 07/16/2021 0945   VLDL 16.0 07/16/2021 0945   [place] 95 07/16/2021 0945    Physical Exam:    VS:  There were no vitals taken for this visit.    Wt Readings from Last 3 Encounters:  01/02/22 220 lb (99.8 kg)  12/20/21 217 lb 4.8 oz (98.6 kg)  12/13/21 218 lb 3.2 oz (99 kg)     GEN: *** Well nourished, well developed in no acute distress HEENT: Normal NECK: No JVD; No  carotid bruits LYMPHATICS: No lymphadenopathy CARDIAC: ***RRR, no murmurs, rubs, gallops RESPIRATORY:  Clear to auscultation without rales, wheezing or rhonchi  ABDOMEN: Soft, non-tender, non-distended MUSCULOSKELETAL:  No edema; No deformity  SKIN: Warm and dry NEUROLOGIC:  Alert and oriented x 3 PSYCHIATRIC:  Normal affect   ASSESSMENT:    No diagnosis found. PLAN:    Palpitations:  Hypertension: On olmesartan-amlodipine-hydrochlorothiazide 40-5-12.5 mg daily  Obesity: On Ozempic  RTC in***  Medication Adjustments/Labs and Tests Ordered: Current medicines are reviewed at length with the patient today.  Concerns regarding medicines are outlined above.  No orders of the defined types were placed in this encounter.  No orders of the defined types were placed in this encounter.   There are no Patient  Instructions on file for this visit.   Signed, Donato Heinz, MD  01/12/2022 4:52 PM    [facility]

## 2022-01-13 ENCOUNTER — Telehealth: Payer: Self-pay

## 2022-01-13 ENCOUNTER — Ambulatory Visit: Payer: 59 | Admitting: Cardiology

## 2022-01-13 ENCOUNTER — Encounter: Payer: Self-pay | Admitting: Internal Medicine

## 2022-01-13 NOTE — Telephone Encounter (Signed)
Pt is calling to see if she would need to keep the GI appt scheduled for tomorrow.  Pt has view the CT results from 01/12/22 and isn't sure exactly how to interpret that.  Please advise

## 2022-01-13 NOTE — Telephone Encounter (Signed)
Spoke w/ patient. She is still bloated and was asking if she need to keep gastroenterology appt for tomorrow. Inform pt that she need to keep  appt for tomorrow. Gastroenterology specialize with the stomach, and since she ios still bloated she need to keep appt. Pt agreed../l,mb

## 2022-01-14 ENCOUNTER — Ambulatory Visit: Payer: 59 | Admitting: Gastroenterology

## 2022-01-14 ENCOUNTER — Encounter: Payer: Self-pay | Admitting: Gastroenterology

## 2022-01-14 VITALS — BP 134/72 | HR 89 | Ht 63.0 in | Wt 218.0 lb

## 2022-01-14 DIAGNOSIS — Z1211 Encounter for screening for malignant neoplasm of colon: Secondary | ICD-10-CM

## 2022-01-14 DIAGNOSIS — R140 Abdominal distension (gaseous): Secondary | ICD-10-CM | POA: Diagnosis not present

## 2022-01-14 DIAGNOSIS — R109 Unspecified abdominal pain: Secondary | ICD-10-CM

## 2022-01-14 MED ORDER — LINACLOTIDE 145 MCG PO CAPS: 145.0000 ug | ORAL_CAPSULE | Freq: Every day | ORAL | 3 refills | Status: DC

## 2022-01-14 MED ORDER — NA SULFATE-K SULFATE-MG SULF 17.5-3.13-1.6 GM/177ML PO SOLN: 1.0000 | Freq: Once | ORAL | 0 refills | Status: AC

## 2022-01-14 MED ORDER — HYOSCYAMINE SULFATE 0.125 MG SL SUBL
0.1250 mg | SUBLINGUAL_TABLET | SUBLINGUAL | 0 refills | Status: DC | PRN
Start: 2022-01-14 — End: 2022-03-21

## 2022-01-14 NOTE — Telephone Encounter (Signed)
Please keep GI appointment.  Thanks

## 2022-01-14 NOTE — Patient Instructions (Addendum)
It was my pleasure to provide care to you today. Based on our discussion, I am providing you with my recommendations below:  RECOMMENDATION(S):   PRESCRIPTION MEDICATION(S):   We have sent the following medication(s) to your pharmacy:  Linzess 145 mcg daily before breakfast. Levsin SL 0.125 mg four times daily 20-30 minutes before meals and at bedtime.  COLONOSCOPY/UPPER ENDOSCOPY:   You have been scheduled for a colonoscopy/upper endoscopy. Please follow written instructions given to you at your visit today.   PREP:   Please pick up your prep supplies at the pharmacy within the next 1-3 days.  INHALERS:   If you use inhalers (even only as needed), please bring them with you on the day of your procedure.  COLONOSCOPY TIPS:  To reduce nausea and dehydration, stay well hydrated for 3-4 days prior to the exam.  To prevent skin/hemorrhoid irritation - prior to wiping, put A&Dointment or vaseline on the toilet paper. Keep a towel or pad on the bed.  BEFORE STARTING YOUR PREP, drink  64oz of clear liquids in the morning. This will help to flush the colon and will ensure you are well hydrated!!!!  NOTE - This is in addition to the fluids required for to complete your prep. Use of a flavored hard candy, such as grape Anise Salvo, can counteract some of the flavor of the prep and may prevent some nausea.   FOLLOW UP:  After your procedure, you will receive a call from my office staff regarding my recommendation for follow up.  BMI:  If you are age 50 or older, your body mass index should be between 23-30. Your Body mass index is 38.62 kg/m. If this is out of the aforementioned range listed, please consider follow up with your Primary Care Provider.  If you are age 57 or younger, your body mass index should be between 19-25. Your Body mass index is 38.62 kg/m. If this is out of the aformentioned range listed, please consider follow up with your Primary Care Provider.   MY  CHART:  The [place] [facility] providers would like to encourage you to use [facility] to communicate with providers for non-urgent requests or questions.  Due to long hold times on the telephone, sending your provider a message by [facility] may be a faster and more efficient way to get a response.  Please allow 48 business hours for a response.  Please remember that this is for non-urgent requests.   Thank you for trusting me with your gastrointestinal care!    Thornton Park, MD, MPH

## 2022-01-14 NOTE — Progress Notes (Signed)
Referring Provider: Cassandria Anger, MD Primary Care Physician:  Cassandria Anger, MD   Reason for Consultation: Constipation   IMPRESSION:  Constipation and bloating not explained by recent ultrasound or CT scan. Acute on chronic problem. Normal TSH and calcium. No alarm features. Minimal relief with MiraLAX AM, stool softeners BID, increasing fiber in her diet, avoiding dairy products, magnesium citrate.    Postprandial bloating not improved off Ozympec for one month. 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  Fatty liver by ultrasound, not mentioned by CT with chronic thrombocytopenia raising the possibility of advanced fibrosis +/- cirrhosis.   Sigmoid diverticulosis by CT. There is no history of diverticulitis.    PLAN: - Levsin SL 0.125 mg QID taking prior to meals - Linzess 145 mcg QAM - EGD and colonoscopy - Evaluate/stage fatty liver after improvement of ongoing GI symptoms - Plan GES if endoscopic evaluation is non-diagnostic   HPI: [name] is a 51 y.o. female referred by NP Pearline Cables for further evaluation of constipation.  The history is obtained through the patient and review of her electronic health record.  She has a history of hypertension, fibromyalgia, obesity, and breast reduction 2022.  She is followed by bariatric provider and has been treated with phentermine and Ozempic. She is a Office manager.  She has previously been under the care  of Dr. Olevia Perches and Dr. Silverio Decamp for reflux and constipation.  Seen today for constipation and bloating.  Bowel movements are small and there is a sense of incomplete evacuation. Last month she developed acute distension. Notes primarily a postprandial bloating that occurs within 15 minutes of eat. There is no associated abdominal pain.  There is no blood or mucus in the stool.  Symptoms started following her breast reduction surgery after  she had significant constipation and passed a large, hard stool.  Minimal relief with MiraLAX AM, stool softeners BID, increasing fiber in her diet, avoiding dairy products.  She has been using magnesium citrate as needed but she has been doing this fairly regularly to have a bowel movement every other day.  Discontinuation of Ozempic last month did not change her symptoms.   Abdominal ultrasound 12/25/2021 showed fatty liver and a left renal cyst  CT of the abdomen and pelvis without contrast 01/12/2022 showed no acute findings.  There is a 2.2 cm stable right adrenal adenoma and a stable 1.3 cm posterior right adrenal adenoma, mild sigmoid diverticulosis, fibroid uterus, small umbilical hernia containing an unremarkable loop of small bowel.  She reports a diagnostic colonoscopy in 2014 showed colitis which was ultimately determined to be related to food poisoning. The results from that procedure are not available to me at this time.   She understands that she has a dysfunction GI tract.   Normal TSH and serum calcium 12/24/2021 Normal CBC except for platelets of 149 12/24/2021 Platelets have been low normal to low for the last 5 years ranging from 135-183.  There is no known family history of colon cancer or polyps. No family history of stomach cancer or other GI malignancy. No family history of inflammatory bowel disease or celiac.    Past Medical History:  Diagnosis Date   Allergy    rhinitis   Anemia, unspecified    Asthma    asthma   Colitis    Fibromyalgia    Hypertension    Leiomyoma of uterus, unspecified    Meralgia paresthetica  Nausea alone    Obesity    Other B-complex deficiencies    Pre-diabetes    Umbilical hernia    Unspecified vitamin D deficiency     Past Surgical History:  Procedure Laterality Date   BREAST REDUCTION SURGERY     fibriodidectomy  2009   MYOMECTOMY  2004   x 2      Current Outpatient Medications  Medication Sig Dispense Refill    ALPRAZolam (XANAX) 0.5 MG tablet TAKE 1 TO 2 TABLETS BY MOUTH EVERY DAY AT BEDTIME 60 tablet 0   Armodafinil 150 MG tablet TAKE 1 TABLET BY MOUTH DAILY 90 tablet 0   cefdinir (OMNICEF) 300 MG capsule Take 1 capsule (300 mg total) by mouth 2 (two) times daily. 20 capsule 0   chlorhexidine (HIBICLENS) 4 % external liquid Apply topically daily as needed. Use 3/week 1000 mL 3   Cholecalciferol 25 MCG (1000 UT) tablet Take 1,000 Units by mouth daily.     cyclobenzaprine (FLEXERIL) 5 MG tablet TAKE 1 TABLET BY MOUTH THREE TIMES A DAY AS NEEDED FOR MUSCLE SPASMS 60 tablet 2   fluticasone (FLONASE) 50 MCG/ACT nasal spray SPRAY 2 SPRAYS INTO EACH NOSTRIL EVERY DAY 48 mL 0   montelukast (SINGULAIR) 10 MG tablet TAKE 1 TABLET BY MOUTH EVERYDAY AT BEDTIME 90 tablet 1   mupirocin cream (BACTROBAN) 2 % Apply 1 application topically 2 (two) times daily. 30 g 3   naproxen (NAPROSYN) 500 MG tablet TAKE 1 TABLET BY MOUTH TWICE A DAY AS NEEDED FOR MODERATE PAIN OR HEADACHE 60 tablet 3   norethindrone-ethinyl estradiol (FEMHRT LOW DOSE) 0.5-2.5 MG-MCG tablet Take 1 tablet by mouth daily.     Olmesartan-amLODIPine-HCTZ 40-5-12.5 MG TABS TAKE 1 TABLET BY MOUTH EVERY DAY 90 tablet 3   phentermine (ADIPEX-P) 37.5 MG tablet Take 37.5 mg by mouth every morning.     Potassium Chloride ER 20 MEQ TBCR TAKE 1 TABLET BY MOUTH EVERY DAY 90 tablet 1   Prenatal Vit-Fe Fumarate-FA (M-NATAL PLUS) 27-1 MG TABS TAKE 1 TABLET BY MOUTH EVERY DAY 90 tablet 3   rizatriptan (MAXALT) 10 MG tablet TAKE 1 TABLET BY MOUTH ONCE AS NEEDED FOR UP TO 1 DOSE FOR MIGRAINE. MAY REPEAT IN 2 HOURS 12 tablet 5   sennosides-docusate sodium (SENOKOT-S) 8.6-50 MG tablet Take 1 tablet by mouth in the morning and at bedtime. 60 tablet 0   triamcinolone (KENALOG) 0.1 % paste Use as directed 1 application. in the mouth or throat 3 (three) times daily. On canker sores 5 g 1   valACYclovir (VALTREX) 1000 MG tablet Take 1 tablet (1,000 mg total) by mouth 3 (three)  times daily. 21 tablet 1   cetirizine (ZYRTEC) 10 MG tablet Take 10 mg by mouth daily. (Patient not taking: Reported on 01/14/2022)     mometasone-formoterol (DULERA) 200-5 MCG/ACT AERO Inhale 2 puffs into the lungs 2 (two) times daily. (Patient not taking: Reported on 01/14/2022) 13 g 5   OZEMPIC, 1 MG/DOSE, 4 MG/3ML SOPN Inject 1 mg into the skin once a week. (Patient not taking: Reported on 01/14/2022)     No current facility-administered medications for this visit.    Allergies as of 01/14/2022   (No Known Allergies)    Family History  Problem Relation Age of Onset   High blood pressure Father    Alcoholism Father    Diabetes Paternal Grandmother    Hypertension Other    Esophageal cancer Neg Hx    Colon cancer Neg Hx  Stomach cancer Neg Hx     Social History   Socioeconomic History   Marital status: Single    Spouse name: Not on file   Number of children: 0   Years of education: Not on file   Highest education level: Not on file  Occupational History   Occupation: Environmental health practitioner: [employer]  Tobacco Use   Smoking status: Never   Smokeless tobacco: Never  Vaping Use   Vaping Use: Never used  Substance and Sexual Activity   Alcohol use: No   Drug use: No   Sexual activity: Not on file  Other Topics Concern   Not on file  Social History Narrative   Not on file   Social Determinants of Health   Financial Resource Strain: Not on file  Food Insecurity: Not on file  Transportation Needs: Not on file  Physical Activity: Not on file  Stress: Not on file  Social Connections: Not on file  Intimate Partner Violence: Not on file    Review of Systems: 12 system ROS is negative except as noted above.   Physical Exam: General:   Alert,  well-nourished, pleasant and cooperative in NAD Head:  Normocephalic and atraumatic. Eyes:  Sclera clear, no icterus.   Conjunctiva pink. Ears:  Normal auditory acuity. Nose:  No deformity, discharge,  or lesions. Mouth:   No deformity or lesions.   Neck:  Supple; no masses or thyromegaly. Lungs:  Clear throughout to auscultation.   No wheezes. Heart:  Regular rate and rhythm; no murmurs. Abdomen:  Soft, nontender, nondistended, normal bowel sounds, no rebound or guarding. No hepatosplenomegaly.   Rectal:  Deferred  Msk:  Symmetrical. No boney deformities LAD: No inguinal or umbilical LAD Extremities:  No clubbing or edema. Neurologic:  Alert and  oriented x4;  grossly nonfocal Skin:  Intact without significant lesions or rashes. Psych:  Alert and cooperative. Normal mood and affect.    Studies/Results: CT Abdomen Pelvis W Contrast  Result Date: 01/13/2022 CLINICAL DATA:  Severe abdominal bloating, upper abdominal mass. Bowel obstruction. EXAM: CT ABDOMEN AND PELVIS WITH CONTRAST TECHNIQUE: Multidetector CT imaging of the abdomen and pelvis was performed using the standard protocol following bolus administration of intravenous contrast. RADIATION DOSE REDUCTION: This exam was performed according to the departmental dose-optimization program which includes automated exposure control, adjustment of the mA and/or kV according to patient size and/or use of iterative reconstruction technique. CONTRAST:  154m OMNIPAQUE IOHEXOL 300 MG/ML  SOLN COMPARISON:  None Available. FINDINGS: Lower chest: No acute abnormality. Hepatobiliary: No focal liver abnormality is seen. No gallstones, gallbladder wall thickening, or biliary dilatation. Pancreas: Unremarkable Spleen: Unremarkable Adrenals/Urinary Tract: Stable 2.2 cm right adrenal adenoma. Stable 13 mm more posterior right adrenal nodule, previously characterized as a lipid poor adenoma. Left adrenal gland is unremarkable. The kidneys are normal in size and position. Simple cortical cyst noted within the lower pole the left kidney. No follow-up imaging is recommended for this lesion. The kidneys are otherwise unremarkable. Bladder unremarkable. Stomach/Bowel: Mild sigmoid  diverticulosis. A small umbilical hernia contains a single unremarkable loop of small bowel. The stomach, small bowel, and large bowel are otherwise unremarkable. No evidence of obstruction or focal inflammation. No free intraperitoneal gas or fluid. Appendix normal. Vascular/Lymphatic: Circumaortic left renal vein. The abdominal vasculature is otherwise unremarkable. No pathologic adenopathy within the abdomen and pelvis. Reproductive: The uterus is lobulated and demonstrates heterogeneous enhancement with scattered coarse calcifications in keeping with multiple underlying uterine fibroids. The pelvic  organs are otherwise unremarkable. Other: None Musculoskeletal: Degenerative changes seen within the lumbar spine. No acute bone abnormality. No lytic or blastic bone lesion. IMPRESSION: No acute intra-abdominal pathology identified. No bowel obstruction. No definite radiographic explanation for the patient's reported symptoms. Stable 2.2 cm benign right adrenal adenoma. Stable 1.3 cm more posterior right adrenal nodule, previously characterized as a lipid poor adenoma. Mild sigmoid diverticulosis without superimposed acute inflammatory change. Fibroid uterus. Small umbilical hernia containing and unremarkable loop of small bowel, similar to prior examination. Electronically Signed   By: Fidela Salisbury M.D.   On: 01/13/2022 02:02      Laure Leone L. Tarri Glenn, MD, MPH 01/14/2022, 9:19 AM

## 2022-01-16 ENCOUNTER — Ambulatory Visit: Payer: 59 | Admitting: Internal Medicine

## 2022-01-16 ENCOUNTER — Encounter: Payer: Self-pay | Admitting: Internal Medicine

## 2022-01-16 VITALS — BP 118/74 | HR 103 | Temp 98.1°F | Ht 63.0 in | Wt 218.4 lb

## 2022-01-16 DIAGNOSIS — R7303 Prediabetes: Secondary | ICD-10-CM

## 2022-01-16 DIAGNOSIS — R140 Abdominal distension (gaseous): Secondary | ICD-10-CM | POA: Diagnosis not present

## 2022-01-16 DIAGNOSIS — R110 Nausea: Secondary | ICD-10-CM

## 2022-01-16 DIAGNOSIS — Z23 Encounter for immunization: Secondary | ICD-10-CM

## 2022-01-16 DIAGNOSIS — R1084 Generalized abdominal pain: Secondary | ICD-10-CM

## 2022-01-16 DIAGNOSIS — F419 Anxiety disorder, unspecified: Secondary | ICD-10-CM | POA: Insufficient documentation

## 2022-01-16 MED ORDER — ALPRAZOLAM 0.5 MG PO TABS: ORAL_TABLET | ORAL | 3 refills | Status: DC

## 2022-01-16 NOTE — Assessment & Plan Note (Signed)
Xanax prn  Potential benefits of a long term benzodiazepines  use as well as potential risks  and complications were explained to the patient and were aknowledged. 

## 2022-01-16 NOTE — Assessment & Plan Note (Signed)
Bloating - probable gastroparesis post-Ozempic. She may be doing a little better. Dr Tarri Glenn is planning to do colonoscopy and gave [name], Levsin, Linzess. CT was OK

## 2022-01-16 NOTE — Assessment & Plan Note (Signed)
Will monitor a1c

## 2022-01-16 NOTE — Addendum Note (Signed)
Addended by: Shirlyn Goltz on: 01/16/2022 04:22 PM   Modules accepted: Orders

## 2022-01-16 NOTE — Progress Notes (Signed)
Subjective:  Patient ID: [name], female    DOB: 1970-08-12  Age: 51 y.o. MRN: [id number]  CC: Follow-up   HPI [name] presents for bloating - probable gastroparesis post-Ozempic. She may be doing a little better. Dr Tarri Glenn is planning to do colonoscopy and gave Bea Laura, Levsin, Linzess. F/u on anxiety  Outpatient Medications Prior to Visit  Medication Sig Dispense Refill   Armodafinil 150 MG tablet TAKE 1 TABLET BY MOUTH DAILY 90 tablet 0   cefdinir (OMNICEF) 300 MG capsule Take 1 capsule (300 mg total) by mouth 2 (two) times daily. 20 capsule 0   cetirizine (ZYRTEC) 10 MG tablet Take 10 mg by mouth daily.     chlorhexidine (HIBICLENS) 4 % external liquid Apply topically daily as needed. Use 3/week 1000 mL 3   Cholecalciferol 25 MCG (1000 UT) tablet Take 1,000 Units by mouth daily.     cyclobenzaprine (FLEXERIL) 5 MG tablet TAKE 1 TABLET BY MOUTH THREE TIMES A DAY AS NEEDED FOR MUSCLE SPASMS 60 tablet 2   fluticasone (FLONASE) 50 MCG/ACT nasal spray SPRAY 2 SPRAYS INTO EACH NOSTRIL EVERY DAY 48 mL 0   hyoscyamine (LEVSIN SL) 0.125 MG SL tablet Place 1 tablet (0.125 mg total) under the tongue every 4 (four) hours as needed. 30 tablet 0   linaclotide (LINZESS) 145 MCG CAPS capsule Take 1 capsule (145 mcg total) by mouth daily before breakfast. 30 capsule 3   montelukast (SINGULAIR) 10 MG tablet TAKE 1 TABLET BY MOUTH EVERYDAY AT BEDTIME 90 tablet 1   mupirocin cream (BACTROBAN) 2 % Apply 1 application topically 2 (two) times daily. 30 g 3   naproxen (NAPROSYN) 500 MG tablet TAKE 1 TABLET BY MOUTH TWICE A DAY AS NEEDED FOR MODERATE PAIN OR HEADACHE 60 tablet 3   norethindrone-ethinyl estradiol (FEMHRT LOW DOSE) 0.5-2.5 MG-MCG tablet Take 1 tablet by mouth daily.     Olmesartan-amLODIPine-HCTZ 40-5-12.5 MG TABS TAKE 1 TABLET BY MOUTH EVERY DAY 90 tablet 3   phentermine (ADIPEX-P) 37.5 MG tablet Take 37.5 mg by mouth every morning.     Potassium Chloride ER 20 MEQ  TBCR TAKE 1 TABLET BY MOUTH EVERY DAY 90 tablet 1   Prenatal Vit-Fe Fumarate-FA (M-NATAL PLUS) 27-1 MG TABS TAKE 1 TABLET BY MOUTH EVERY DAY 90 tablet 3   rizatriptan (MAXALT) 10 MG tablet TAKE 1 TABLET BY MOUTH ONCE AS NEEDED FOR UP TO 1 DOSE FOR MIGRAINE. MAY REPEAT IN 2 HOURS 12 tablet 5   sennosides-docusate sodium (SENOKOT-S) 8.6-50 MG tablet Take 1 tablet by mouth in the morning and at bedtime. 60 tablet 0   triamcinolone (KENALOG) 0.1 % paste Use as directed 1 application. in the mouth or throat 3 (three) times daily. On canker sores 5 g 1   valACYclovir (VALTREX) 1000 MG tablet Take 1 tablet (1,000 mg total) by mouth 3 (three) times daily. 21 tablet 1   ALPRAZolam (XANAX) 0.5 MG tablet TAKE 1 TO 2 TABLETS BY MOUTH EVERY DAY AT BEDTIME 60 tablet 0   mometasone-formoterol (DULERA) 200-5 MCG/ACT AERO Inhale 2 puffs into the lungs 2 (two) times daily. (Patient not taking: Reported on 01/14/2022) 13 g 5   OZEMPIC, 1 MG/DOSE, 4 MG/3ML SOPN Inject 1 mg into the skin once a week. (Patient not taking: Reported on 01/14/2022)     No facility-administered medications prior to visit.    ROS: Review of Systems  Constitutional:  Positive for fatigue. Negative for activity change, appetite change, chills and unexpected weight  change.  HENT:  Negative for congestion, mouth sores and sinus pressure.   Eyes:  Negative for visual disturbance.  Respiratory:  Negative for cough and chest tightness.   Gastrointestinal:  Positive for abdominal distention, constipation and nausea. Negative for abdominal pain, blood in stool and vomiting.  Genitourinary:  Negative for difficulty urinating, frequency and vaginal pain.  Musculoskeletal:  Negative for back pain and gait problem.  Skin:  Negative for pallor and rash.  Neurological:  Negative for dizziness, tremors, weakness, numbness and headaches.  Psychiatric/Behavioral:  Negative for confusion, sleep disturbance and suicidal ideas. The patient is  nervous/anxious.     Objective:  BP 118/74   Pulse (!) 103   Temp 98.1 F (36.7 C) (Oral)   Ht '5\' 3"'$  (1.6 m)   Wt 218 lb 6 oz (99.1 kg)   SpO2 92%   BMI 38.68 kg/m   BP Readings from Last 3 Encounters:  01/16/22 118/74  01/14/22 134/72  01/02/22 110/80    Wt Readings from Last 3 Encounters:  01/16/22 218 lb 6 oz (99.1 kg)  01/14/22 218 lb (98.9 kg)  01/02/22 220 lb (99.8 kg)    Physical Exam Constitutional:      General: She is not in acute distress.    Appearance: She is well-developed. She is obese.  HENT:     Head: Normocephalic.     Right Ear: External ear normal.     Left Ear: External ear normal.     Nose: Nose normal.  Eyes:     General:        Right eye: No discharge.        Left eye: No discharge.     Conjunctiva/sclera: Conjunctivae normal.     Pupils: Pupils are equal, round, and reactive to light.  Neck:     Thyroid: No thyromegaly.     Vascular: No JVD.     Trachea: No tracheal deviation.  Cardiovascular:     Rate and Rhythm: Regular rhythm. Tachycardia present.     Heart sounds: Normal heart sounds.  Pulmonary:     Effort: No respiratory distress.     Breath sounds: No stridor. No wheezing.  Abdominal:     General: Bowel sounds are normal. There is distension.     Palpations: Abdomen is soft. There is no mass.     Tenderness: There is no abdominal tenderness. There is no guarding or rebound.  Musculoskeletal:        General: No tenderness.     Cervical back: Normal range of motion and neck supple. No rigidity.  Lymphadenopathy:     Cervical: No cervical adenopathy.  Skin:    Findings: No erythema or rash.  Neurological:     Mental Status: She is oriented to person, place, and time.     Cranial Nerves: No cranial nerve deficit.     Motor: No abnormal muscle tone.     Coordination: Coordination normal.     Deep Tendon Reflexes: Reflexes normal.  Psychiatric:        Mood and Affect: Mood normal.        Behavior: Behavior normal.         Thought Content: Thought content normal.        Judgment: Judgment normal.   No HSM HR 90s  Lab Results  Component Value Date   WBC 4.7 12/24/2021   HGB 13.7 12/24/2021   HCT 41.1 12/24/2021   PLT 149.0 (L) 12/24/2021   GLUCOSE 87 12/24/2021  CHOL 171 07/16/2021   TRIG 80.0 07/16/2021   HDL 59.70 07/16/2021   LDLCALC 95 07/16/2021   ALT 14 12/24/2021   AST 20 12/24/2021   NA 139 12/24/2021   K 3.7 12/24/2021   CL 102 12/24/2021   CREATININE 0.68 12/24/2021   BUN 11 12/24/2021   CO2 29 12/24/2021   TSH 0.79 12/24/2021   INR 0.9 RATIO 03/15/2007   HGBA1C 6.0 11/25/2018    CT Abdomen Pelvis W Contrast  Result Date: 01/13/2022 CLINICAL DATA:  Severe abdominal bloating, upper abdominal mass. Bowel obstruction. EXAM: CT ABDOMEN AND PELVIS WITH CONTRAST TECHNIQUE: Multidetector CT imaging of the abdomen and pelvis was performed using the standard protocol following bolus administration of intravenous contrast. RADIATION DOSE REDUCTION: This exam was performed according to the departmental dose-optimization program which includes automated exposure control, adjustment of the mA and/or kV according to patient size and/or use of iterative reconstruction technique. CONTRAST:  165m OMNIPAQUE IOHEXOL 300 MG/ML  SOLN COMPARISON:  None Available. FINDINGS: Lower chest: No acute abnormality. Hepatobiliary: No focal liver abnormality is seen. No gallstones, gallbladder wall thickening, or biliary dilatation. Pancreas: Unremarkable Spleen: Unremarkable Adrenals/Urinary Tract: Stable 2.2 cm right adrenal adenoma. Stable 13 mm more posterior right adrenal nodule, previously characterized as a lipid poor adenoma. Left adrenal gland is unremarkable. The kidneys are normal in size and position. Simple cortical cyst noted within the lower pole the left kidney. No follow-up imaging is recommended for this lesion. The kidneys are otherwise unremarkable. Bladder unremarkable. Stomach/Bowel: Mild sigmoid  diverticulosis. A small umbilical hernia contains a single unremarkable loop of small bowel. The stomach, small bowel, and large bowel are otherwise unremarkable. No evidence of obstruction or focal inflammation. No free intraperitoneal gas or fluid. Appendix normal. Vascular/Lymphatic: Circumaortic left renal vein. The abdominal vasculature is otherwise unremarkable. No pathologic adenopathy within the abdomen and pelvis. Reproductive: The uterus is lobulated and demonstrates heterogeneous enhancement with scattered coarse calcifications in keeping with multiple underlying uterine fibroids. The pelvic organs are otherwise unremarkable. Other: None Musculoskeletal: Degenerative changes seen within the lumbar spine. No acute bone abnormality. No lytic or blastic bone lesion. IMPRESSION: No acute intra-abdominal pathology identified. No bowel obstruction. No definite radiographic explanation for the patient's reported symptoms. Stable 2.2 cm benign right adrenal adenoma. Stable 1.3 cm more posterior right adrenal nodule, previously characterized as a lipid poor adenoma. Mild sigmoid diverticulosis without superimposed acute inflammatory change. Fibroid uterus. Small umbilical hernia containing and unremarkable loop of small bowel, similar to prior examination. Electronically Signed   By: AFidela SalisburyM.D.   On: 01/13/2022 02:02    Assessment & Plan:   Problem List Items Addressed This Visit     Abdominal distention    Bloating - probable gastroparesis post-Ozempic. She may be doing a little better. Dr BTarri Glennis planning to do colonoscopy and gave JBea Laura Levsin, Linzess. CT was OK      Abdominal pain    Bloating - probable gastroparesis post-Ozempic. She may be doing a little better. Dr BTarri Glennis planning to do colonoscopy and gave JBea Laura Levsin, Linzess.      Anxiety disorder    Xanax prn  Potential benefits of a long term benzodiazepines  use as well as potential risks  and  complications were explained to the patient and were aknowledged.       Relevant Medications   ALPRAZolam (XANAX) 0.5 MG tablet   Nausea    Bloating - probable gastroparesis post-Ozempic. She may be doing a  little better. Dr Tarri Glenn is planning to do colonoscopy and gave Bea Laura, Levsin, Linzess.      Prediabetes    Will monitor a1c         Meds ordered this encounter  Medications   ALPRAZolam (XANAX) 0.5 MG tablet    Sig: TAKE 1 TO 2 TABLETS BY MOUTH EVERY DAY AT BEDTIME PRN    Dispense:  60 tablet    Refill:  3    Not to exceed 5 additional fills before 05/02/2022      Follow-up: Return in about 3 months (around 04/18/2022) for a follow-up visit.  Walker Kehr, MD

## 2022-01-16 NOTE — Assessment & Plan Note (Signed)
Bloating - probable gastroparesis post-Ozempic. She may be doing a little better. Dr Tarri Glenn is planning to do colonoscopy and gave Bea Laura, Levsin, Linzess.

## 2022-02-15 ENCOUNTER — Other Ambulatory Visit: Payer: Self-pay | Admitting: Internal Medicine

## 2022-03-05 ENCOUNTER — Telehealth: Payer: Self-pay | Admitting: Gastroenterology

## 2022-03-05 NOTE — Telephone Encounter (Signed)
Contacted patient and let her know that she should be on clear liquids the entire day tomorrow. We also went over prep instructions again and I resent her prep instructions to her through mychart. Patient had no questions at the end of call.

## 2022-03-05 NOTE — Telephone Encounter (Signed)
Inbound call from patient stating that she is scheduled to have a endo and colon with Dr. Tarri Glenn on 8/4. Patient stated that she is supposed to have a light breakfast tomorrow and is wanting to know what that includes. Please advise.

## 2022-03-07 ENCOUNTER — Encounter: Payer: 59 | Admitting: Gastroenterology

## 2022-03-07 ENCOUNTER — Telehealth: Payer: Self-pay

## 2022-03-07 DIAGNOSIS — Z1211 Encounter for screening for malignant neoplasm of colon: Secondary | ICD-10-CM

## 2022-03-07 DIAGNOSIS — R140 Abdominal distension (gaseous): Secondary | ICD-10-CM

## 2022-03-07 NOTE — Telephone Encounter (Signed)
Pt did not stop her phentermine 10 days before procedure. Rescheduled to 03/31/22 at 1:30.

## 2022-03-14 ENCOUNTER — Telehealth: Payer: Self-pay | Admitting: Internal Medicine

## 2022-03-14 NOTE — Telephone Encounter (Signed)
Pt has a boil under her arm and the mupirocin cream is not helping fast enough, pt is requesting we call in cefdinir (OMNICEF) 300 MG capsule RX because when she took the antibiotic last time it worked faster and more effectively.   Please send RX to [id number] Phone:  [phone] Fax:  [phone]

## 2022-03-17 ENCOUNTER — Ambulatory Visit (INDEPENDENT_AMBULATORY_CARE_PROVIDER_SITE_OTHER): Payer: 59 | Admitting: Family Medicine

## 2022-03-17 MED ORDER — CEFDINIR 300 MG PO CAPS: 300.0000 mg | ORAL_CAPSULE | Freq: Two times a day (BID) | ORAL | 0 refills | Status: DC

## 2022-03-17 NOTE — Telephone Encounter (Signed)
[place] if issues. Thx

## 2022-03-18 NOTE — Telephone Encounter (Signed)
Notified pt w/MD response.../lmb 

## 2022-03-20 ENCOUNTER — Telehealth: Payer: Self-pay | Admitting: Gastroenterology

## 2022-03-20 NOTE — Telephone Encounter (Signed)
Inbound call from patient stating that she needs a refill for LEVSIN. Please advise.

## 2022-03-21 MED ORDER — HYOSCYAMINE SULFATE 0.125 MG SL SUBL
0.1250 mg | SUBLINGUAL_TABLET | SUBLINGUAL | 1 refills | Status: DC | PRN
Start: 2022-03-21 — End: 2024-02-17

## 2022-03-21 NOTE — Telephone Encounter (Signed)
Refill sent to patients pharmacy. 

## 2022-03-24 ENCOUNTER — Encounter: Payer: Self-pay | Admitting: Gastroenterology

## 2022-03-27 DIAGNOSIS — Z0289 Encounter for other administrative examinations: Secondary | ICD-10-CM

## 2022-03-31 ENCOUNTER — Encounter: Payer: Self-pay | Admitting: Gastroenterology

## 2022-03-31 ENCOUNTER — Ambulatory Visit (INDEPENDENT_AMBULATORY_CARE_PROVIDER_SITE_OTHER): Payer: 59 | Admitting: Family Medicine

## 2022-03-31 ENCOUNTER — Ambulatory Visit (AMBULATORY_SURGERY_CENTER): Payer: 59 | Admitting: Gastroenterology

## 2022-03-31 ENCOUNTER — Telehealth: Payer: Self-pay | Admitting: Gastroenterology

## 2022-03-31 VITALS — BP 137/80 | HR 77 | Temp 98.0°F | Resp 12 | Ht 63.0 in | Wt 218.0 lb

## 2022-03-31 DIAGNOSIS — K621 Rectal polyp: Secondary | ICD-10-CM

## 2022-03-31 DIAGNOSIS — Z1211 Encounter for screening for malignant neoplasm of colon: Secondary | ICD-10-CM | POA: Diagnosis not present

## 2022-03-31 DIAGNOSIS — R1013 Epigastric pain: Secondary | ICD-10-CM

## 2022-03-31 DIAGNOSIS — K2960 Other gastritis without bleeding: Secondary | ICD-10-CM

## 2022-03-31 DIAGNOSIS — D128 Benign neoplasm of rectum: Secondary | ICD-10-CM

## 2022-03-31 DIAGNOSIS — K2100 Gastro-esophageal reflux disease with esophagitis, without bleeding: Secondary | ICD-10-CM | POA: Diagnosis not present

## 2022-03-31 DIAGNOSIS — R140 Abdominal distension (gaseous): Secondary | ICD-10-CM

## 2022-03-31 MED ORDER — PANTOPRAZOLE SODIUM 40 MG PO TBEC: 40.0000 mg | DELAYED_RELEASE_TABLET | Freq: Every day | ORAL | 3 refills | Status: DC

## 2022-03-31 MED ORDER — LINACLOTIDE 290 MCG PO CAPS: 290.0000 ug | ORAL_CAPSULE | Freq: Every day | ORAL | 3 refills | Status: DC

## 2022-03-31 MED ORDER — SODIUM CHLORIDE 0.9 % IV SOLN: 500.0000 mL | Freq: Once | INTRAVENOUS | Status: DC

## 2022-03-31 NOTE — Progress Notes (Signed)
[facility] History and Physical   Primary Care Physician:  Cassandria Anger, MD   Reason for Procedure:  Abdominal bloating, upper abdominal discomfort, constipation  Plan:    EGD and colonoscopy with possible interventions as needed     HPI: [name] is a very pleasant 51 y.o. female here for EGD and colonoscopy for evaluation of generalized abdominal boating and upper abdominal discomfort. Due for colorectal cancer screening.   The risks and benefits as well as alternatives of endoscopic procedure(s) have been discussed and reviewed. All questions answered. The patient agrees to proceed.    Past Medical History:  Diagnosis Date   Allergy    rhinitis   Anemia, unspecified    Asthma    asthma   Colitis    Fibromyalgia    Hypertension    Leiomyoma of uterus, unspecified    Meralgia paresthetica    Nausea alone    Obesity    Other B-complex deficiencies    Pre-diabetes    Umbilical hernia    Unspecified vitamin D deficiency     Past Surgical History:  Procedure Laterality Date   BREAST REDUCTION SURGERY     fibriodidectomy  2009   MYOMECTOMY  2004   x 2    Prior to Admission medications   Medication Sig Start Date End Date Taking? Authorizing Provider  ALPRAZolam (XANAX) 0.5 MG tablet TAKE 1 TO 2 TABLETS BY MOUTH EVERY DAY AT BEDTIME PRN 01/16/22  Yes Plotnikov, Evie Lacks, MD  [facility] SENNA PLUS 8.6-50 MG tablet TAKE 1 TABLET BY MOUTH IN THE MORNING AND IN THE EVENING 02/18/22  Yes Plotnikov, Evie Lacks, MD  cyclobenzaprine (FLEXERIL) 5 MG tablet TAKE 1 TABLET BY MOUTH THREE TIMES A DAY AS NEEDED FOR MUSCLE SPASMS 02/17/22  Yes Plotnikov, Evie Lacks, MD  hyoscyamine (LEVSIN SL) 0.125 MG SL tablet Place 1 tablet (0.125 mg total) under the tongue every 4 (four) hours as needed. 03/21/22  Yes Thornton Park, MD  linaclotide Rolan Lipa) 145 MCG CAPS capsule Take 1 capsule (145 mcg total) by mouth daily before breakfast. 01/14/22  Yes Thornton Park,  MD  montelukast (SINGULAIR) 10 MG tablet TAKE 1 TABLET BY MOUTH EVERYDAY AT BEDTIME 08/05/21  Yes Plotnikov, Evie Lacks, MD  norethindrone-ethinyl estradiol (FEMHRT LOW DOSE) 0.5-2.5 MG-MCG tablet Take 1 tablet by mouth daily. 12/02/19  Yes [provider]  Olmesartan-amLODIPine-HCTZ 40-5-12.5 MG TABS TAKE 1 TABLET BY MOUTH EVERY DAY 10/01/21  Yes Plotnikov, Evie Lacks, MD  Potassium Chloride ER 20 MEQ TBCR TAKE 1 TABLET BY MOUTH EVERY DAY 12/03/21  Yes Plotnikov, Evie Lacks, MD  Prenatal Vit-Fe Fumarate-FA (M-NATAL PLUS) 27-1 MG TABS TAKE 1 TABLET BY MOUTH EVERY DAY 07/18/21  Yes Plotnikov, Evie Lacks, MD  Armodafinil 150 MG tablet TAKE 1 TABLET BY MOUTH DAILY Patient not taking: Reported on 03/31/2022 11/03/19   Plotnikov, Evie Lacks, MD  cefdinir (OMNICEF) 300 MG capsule Take 1 capsule (300 mg total) by mouth 2 (two) times daily. Patient not taking: Reported on 03/31/2022 03/17/22   Plotnikov, Evie Lacks, MD  cetirizine (ZYRTEC) 10 MG tablet Take 10 mg by mouth daily. Patient not taking: Reported on 03/31/2022    [provider]  chlorhexidine (HIBICLENS) 4 % external liquid Apply topically daily as needed. Use 3/week 07/16/21   Hoyt Koch, MD  Cholecalciferol 25 MCG (1000 UT) tablet Take 1,000 Units by mouth daily.    [provider]  fluticasone (FLONASE) 50 MCG/ACT nasal spray SPRAY 2 SPRAYS INTO [facility]  NOSTRIL EVERY DAY 06/08/20   Plotnikov, Evie Lacks, MD  mometasone-formoterol (DULERA) 200-5 MCG/ACT AERO Inhale 2 puffs into the lungs 2 (two) times daily. Patient not taking: Reported on 01/14/2022 10/25/19   Plotnikov, Evie Lacks, MD  mupirocin cream (BACTROBAN) 2 % Apply 1 application topically 2 (two) times daily. 07/16/21   Hoyt Koch, MD  naproxen (NAPROSYN) 500 MG tablet TAKE 1 TABLET BY MOUTH TWICE A DAY AS NEEDED FOR MODERATE PAIN OR HEADACHE 03/14/21   Plotnikov, Evie Lacks, MD  OZEMPIC, 1 MG/DOSE, 4 MG/3ML SOPN Inject 1 mg into the skin once a week. Patient  not taking: Reported on 01/14/2022 02/01/21   [provider]  phentermine (ADIPEX-P) 37.5 MG tablet Take 37.5 mg by mouth every morning. 05/01/21   [provider]  rizatriptan (MAXALT) 10 MG tablet TAKE 1 TABLET BY MOUTH ONCE AS NEEDED FOR UP TO 1 DOSE FOR MIGRAINE. MAY REPEAT IN 2 HOURS 04/16/21   Plotnikov, Evie Lacks, MD  triamcinolone (KENALOG) 0.1 % paste Use as directed 1 application. in the mouth or throat 3 (three) times daily. On canker sores 01/02/22   Plotnikov, Evie Lacks, MD  valACYclovir (VALTREX) 1000 MG tablet Take 1 tablet (1,000 mg total) by mouth 3 (three) times daily. 01/02/22   Plotnikov, Evie Lacks, MD    Current Outpatient Medications  Medication Sig Dispense Refill   ALPRAZolam (XANAX) 0.5 MG tablet TAKE 1 TO 2 TABLETS BY MOUTH EVERY DAY AT BEDTIME PRN 60 tablet 3   [facility] SENNA PLUS 8.6-50 MG tablet TAKE 1 TABLET BY MOUTH IN THE MORNING AND IN THE EVENING 60 tablet 0   cyclobenzaprine (FLEXERIL) 5 MG tablet TAKE 1 TABLET BY MOUTH THREE TIMES A DAY AS NEEDED FOR MUSCLE SPASMS 60 tablet 2   hyoscyamine (LEVSIN SL) 0.125 MG SL tablet Place 1 tablet (0.125 mg total) under the tongue every 4 (four) hours as needed. 45 tablet 1   linaclotide (LINZESS) 145 MCG CAPS capsule Take 1 capsule (145 mcg total) by mouth daily before breakfast. 30 capsule 3   montelukast (SINGULAIR) 10 MG tablet TAKE 1 TABLET BY MOUTH EVERYDAY AT BEDTIME 90 tablet 1   norethindrone-ethinyl estradiol (FEMHRT LOW DOSE) 0.5-2.5 MG-MCG tablet Take 1 tablet by mouth daily.     Olmesartan-amLODIPine-HCTZ 40-5-12.5 MG TABS TAKE 1 TABLET BY MOUTH EVERY DAY 90 tablet 3   Potassium Chloride ER 20 MEQ TBCR TAKE 1 TABLET BY MOUTH EVERY DAY 90 tablet 1   Prenatal Vit-Fe Fumarate-FA (M-NATAL PLUS) 27-1 MG TABS TAKE 1 TABLET BY MOUTH EVERY DAY 90 tablet 3   Armodafinil 150 MG tablet TAKE 1 TABLET BY MOUTH DAILY (Patient not taking: Reported on 03/31/2022) 90 tablet 0   cefdinir (OMNICEF) 300 MG capsule Take 1  capsule (300 mg total) by mouth 2 (two) times daily. (Patient not taking: Reported on 03/31/2022) 20 capsule 0   cetirizine (ZYRTEC) 10 MG tablet Take 10 mg by mouth daily. (Patient not taking: Reported on 03/31/2022)     chlorhexidine (HIBICLENS) 4 % external liquid Apply topically daily as needed. Use 3/week 1000 mL 3   Cholecalciferol 25 MCG (1000 UT) tablet Take 1,000 Units by mouth daily.     fluticasone (FLONASE) 50 MCG/ACT nasal spray SPRAY 2 SPRAYS INTO EACH NOSTRIL EVERY DAY 48 mL 0   mometasone-formoterol (DULERA) 200-5 MCG/ACT AERO Inhale 2 puffs into the lungs 2 (two) times daily. (Patient not taking: Reported on 01/14/2022) 13 g 5   mupirocin cream (BACTROBAN) 2 %  Apply 1 application topically 2 (two) times daily. 30 g 3   naproxen (NAPROSYN) 500 MG tablet TAKE 1 TABLET BY MOUTH TWICE A DAY AS NEEDED FOR MODERATE PAIN OR HEADACHE 60 tablet 3   OZEMPIC, 1 MG/DOSE, 4 MG/3ML SOPN Inject 1 mg into the skin once a week. (Patient not taking: Reported on 01/14/2022)     phentermine (ADIPEX-P) 37.5 MG tablet Take 37.5 mg by mouth every morning.     rizatriptan (MAXALT) 10 MG tablet TAKE 1 TABLET BY MOUTH ONCE AS NEEDED FOR UP TO 1 DOSE FOR MIGRAINE. MAY REPEAT IN 2 HOURS 12 tablet 5   triamcinolone (KENALOG) 0.1 % paste Use as directed 1 application. in the mouth or throat 3 (three) times daily. On canker sores 5 g 1   valACYclovir (VALTREX) 1000 MG tablet Take 1 tablet (1,000 mg total) by mouth 3 (three) times daily. 21 tablet 1   Current Facility-Administered Medications  Medication Dose Route Frequency Provider Last Rate Last Admin   0.9 %  sodium chloride infusion  500 mL Intravenous Once Mauri Pole, MD        Allergies as of 03/31/2022   (No Known Allergies)    Family History  Problem Relation Age of Onset   High blood pressure Father    Alcoholism Father    Diabetes Paternal Grandmother    Hypertension Other    Esophageal cancer Neg Hx    Colon cancer Neg Hx    Stomach  cancer Neg Hx     Social History   Socioeconomic History   Marital status: Single    Spouse name: Not on file   Number of children: 0   Years of education: Not on file   Highest education level: Not on file  Occupational History   Occupation: Environmental health practitioner: [employer]  Tobacco Use   Smoking status: Never   Smokeless tobacco: Never  Vaping Use   Vaping Use: Never used  Substance and Sexual Activity   Alcohol use: No   Drug use: No   Sexual activity: Not on file  Other Topics Concern   Not on file  Social History Narrative   Not on file   Social Determinants of Health   Financial Resource Strain: Not on file  Food Insecurity: Not on file  Transportation Needs: Not on file  Physical Activity: Not on file  Stress: Not on file  Social Connections: Not on file  Intimate Partner Violence: Not on file    Review of Systems:  All other review of systems negative except as mentioned in the HPI.  Physical Exam: Vital signs in last 24 hours: BP (!) 152/71   Pulse 88   Temp 98 F (36.7 C)   Ht '5\' 3"'$  (1.6 m)   Wt 218 lb (98.9 kg)   SpO2 98%   BMI 38.62 kg/m  General:   Alert, NAD Lungs:  Clear .   Heart:  Regular rate and rhythm Abdomen:  Soft, nontender and nondistended. Neuro/Psych:  Alert and cooperative. Normal mood and affect. A and O x 3  Reviewed labs, radiology imaging, old records and pertinent past GI work up  Patient is appropriate for planned procedure(s) and anesthesia in an ambulatory setting   K. Denzil Magnuson , MD [phone]

## 2022-03-31 NOTE — Progress Notes (Signed)
To pacu, VSS. Report to [email or web address] 

## 2022-03-31 NOTE — Progress Notes (Signed)
Called to room to assist during endoscopic procedure.  Patient ID and intended procedure confirmed with present staff. Received instructions for my participation in the procedure from the performing physician.  

## 2022-03-31 NOTE — Op Note (Signed)
[place] Patient Name: [name] Procedure Date: 03/31/2022 1:15 PM MRN: [id number] Endoscopist: Mauri Pole , MD Age: 51 Referring MD:  Date of Birth: Sep 06, 1970 Gender: Female Account #: [id number] Procedure:                Colonoscopy Indications:              Screening for colorectal malignant neoplasm Medicines:                Monitored Anesthesia Care Procedure:                Pre-Anesthesia Assessment:                           - Prior to the procedure, a History and Physical                            was performed, and patient medications and                            allergies were reviewed. The patient's tolerance of                            previous anesthesia was also reviewed. The risks                            and benefits of the procedure and the sedation                            options and risks were discussed with the patient.                            All questions were answered, and informed consent                            was obtained. Prior Anticoagulants: The patient has                            taken no previous anticoagulant or antiplatelet                            agents. ASA Grade Assessment: III - A patient with                            severe systemic disease. After reviewing the risks                            and benefits, the patient was deemed in                            satisfactory condition to undergo the procedure.                           After obtaining informed consent, the colonoscope  was passed under direct vision. Throughout the                            procedure, the patient's blood pressure, pulse, and                            oxygen saturations were monitored continuously. The                            Olympus PCF-H190DL (#[id number]) Colonoscope was                            introduced through the anus and advanced to the the                            cecum,  identified by appendiceal orifice and                            ileocecal valve. The colonoscopy was performed                            without difficulty. The patient tolerated the                            procedure well. The quality of the bowel                            preparation was good. The ileocecal valve,                            appendiceal orifice, and rectum were photographed. Scope In: 1:35:52 PM Scope Out: 1:53:19 PM Scope Withdrawal Time: 0 hours 12 minutes 10 seconds  Total Procedure Duration: 0 hours 17 minutes 27 seconds  Findings:                 The perianal and digital rectal examinations were                            normal.                           The terminal ileum appeared normal.                           Scattered small-mouthed diverticula were found in                            the sigmoid colon and descending colon.                           Two sessile polyps were found in the rectum. The                            polyps were 1 to 2 mm in size. These polyps were  removed with a cold biopsy forceps. Resection and                            retrieval were complete.                           Non-bleeding external and internal hemorrhoids were                            found during retroflexion. The hemorrhoids were                            small. Complications:            No immediate complications. Estimated Blood Loss:     Estimated blood loss was minimal. Impression:               - The examined portion of the ileum was normal.                           - Diverticulosis in the sigmoid colon and in the                            descending colon.                           - Two 1 to 2 mm polyps in the rectum, removed with                            a cold biopsy forceps. Resected and retrieved.                           - Non-bleeding external and internal hemorrhoids. Recommendation:           - Patient has a  contact number available for                            emergencies. The signs and symptoms of potential                            delayed complications were discussed with the                            patient. Return to normal activities tomorrow.                            Written discharge instructions were provided to the                            patient.                           - Resume previous diet.                           - Continue present medications.                           -  Await pathology results.                           - Repeat colonoscopy in 5-10 years for surveillance                            based on pathology results.                           - Change Rx Linzess to 260mg daily X 30 days with                            3 refills. Hold Miralax, senna and colace                           - Return to GI clinic at the next available                            appointment with Dr BTarri Glenn KMauri Pole MD 03/31/2022 2:02:45 PM This report has been signed electronically.

## 2022-03-31 NOTE — Telephone Encounter (Signed)
Hi Dr. Tarri Glenn,  This patient had a Colon/EGD with Dr. Silverio Decamp this morning.  She said she would like to switch her care to her as she has only seen you once and she felt like she really clicked with Dr. Silverio Decamp.  Please let me know if you agree with the switch.  Thank you.

## 2022-03-31 NOTE — Patient Instructions (Signed)
Handout on hemorrhoids, diverticulosis, polyps, and gastritis given to patient. Await pathology results. Resume previous diet and continue present medications  Follow anti-reflux regimen Pick up prescription for Linzess - 290 mcg daily for 30 days with 3 refills. HOLD Miralax, senna, and colace.  Pick up prescription for Protonix (pantoprazole) 40 mg daily for 3 months. No Ibuprofen, Naproxen, or any other non-steroidal anti-inflammatory medications. STOP probiotics. Follow up in GI clinic at next available appointment with Dr. Tarri Glenn Repeat colonoscopy for surveillance will be determined based off of pathology results.   YOU HAD AN ENDOSCOPIC PROCEDURE TODAY AT [facility]:   Refer to the procedure report that was given to you for any specific questions about what was found during the examination.  If the procedure report does not answer your questions, please call your gastroenterologist to clarify.  If you requested that your care partner not be given the details of your procedure findings, then the procedure report has been included in a sealed envelope for you to review at your convenience later.  YOU SHOULD EXPECT: Some feelings of bloating in the abdomen. Passage of more gas than usual.  Walking can help get rid of the air that was put into your GI tract during the procedure and reduce the bloating. If you had a lower endoscopy (such as a colonoscopy or flexible sigmoidoscopy) you may notice spotting of blood in your stool or on the toilet paper. If you underwent a bowel prep for your procedure, you may not have a normal bowel movement for a few days.  Please Note:  You might notice some irritation and congestion in your nose or some drainage.  This is from the oxygen used during your procedure.  There is no need for concern and it should clear up in a day or so.  SYMPTOMS TO REPORT IMMEDIATELY:  Following lower endoscopy (colonoscopy or flexible  sigmoidoscopy):  Excessive amounts of blood in the stool  Significant tenderness or worsening of abdominal pains  Swelling of the abdomen that is new, acute  Fever of 100F or higher  Following upper endoscopy (EGD)  Vomiting of blood or coffee ground material  New chest pain or pain under the shoulder blades  Painful or persistently difficult swallowing  New shortness of breath  Fever of 100F or higher  Black, tarry-looking stools  For urgent or emergent issues, a gastroenterologist can be reached at any hour by calling [phone]. Do not use MyChart messaging for urgent concerns.    DIET:  We do recommend a small meal at first, but then you may proceed to your regular diet.  Drink plenty of fluids but you should avoid alcoholic beverages for 24 hours.  ACTIVITY:  You should plan to take it easy for the rest of today and you should NOT DRIVE or use heavy machinery until tomorrow (because of the sedation medicines used during the test).    FOLLOW UP: Our staff will call the number listed on your records the next business day following your procedure.  We will call around 7:15- 8:00 am to check on you and address any questions or concerns that you may have regarding the information given to you following your procedure. If we do not reach you, we will leave a message.  If you develop any symptoms (ie: fever, flu-like symptoms, shortness of breath, cough etc.) before then, please call [phone].  If you test positive for Covid 19 in the 2 weeks post procedure, please call and report  this information to [place].    If any biopsies were taken you will be contacted by phone or by letter within the next 1-3 weeks.  Please call us at [phone] if you have not heard about the biopsies in 3 weeks.    SIGNATURES/CONFIDENTIALITY: You and/or your care partner have signed paperwork which will be entered into your electronic medical record.  These signatures attest to the fact that that the  information above on your After Visit Summary has been reviewed and is understood.  Full responsibility of the confidentiality of this discharge information lies with you and/or your care-partner.

## 2022-03-31 NOTE — Op Note (Signed)
[place] Patient Name: [name] Procedure Date: 03/31/2022 1:16 PM MRN: [id number] Endoscopist: Mauri Pole , MD Age: 51 Referring MD:  Date of Birth: 1971-06-21 Gender: Female Account #: [id number] Procedure:                Upper GI endoscopy Indications:              Epigastric abdominal pain, Dyspepsia, Abdominal                            distention, Abdominal bloating Medicines:                Monitored Anesthesia Care Procedure:                Pre-Anesthesia Assessment:                           - Prior to the procedure, a History and Physical                            was performed, and patient medications and                            allergies were reviewed. The patient's tolerance of                            previous anesthesia was also reviewed. The risks                            and benefits of the procedure and the sedation                            options and risks were discussed with the patient.                            All questions were answered, and informed consent                            was obtained. Prior Anticoagulants: The patient has                            taken no previous anticoagulant or antiplatelet                            agents. ASA Grade Assessment: III - A patient with                            severe systemic disease. After reviewing the risks                            and benefits, the patient was deemed in                            satisfactory condition to undergo the procedure.  After obtaining informed consent, the endoscope was                            passed under direct vision. Throughout the                            procedure, the patient's blood pressure, pulse, and                            oxygen saturations were monitored continuously. The                            GIF [id number] #[id number] was introduced through the                            mouth, and advanced  to the second part of duodenum.                            The upper GI endoscopy was accomplished without                            difficulty. The patient tolerated the procedure                            well. Scope In: Scope Out: Findings:                 LA Grade C (one or more mucosal breaks continuous                            between tops of 2 or more mucosal folds, less than                            75% circumference) esophagitis with no bleeding was                            found 34 to 36 cm from the incisors.                           A 2 cm hiatal hernia was present.                           Patchy mild inflammation characterized by                            congestion (edema), erosions and erythema was found                            in the gastric antrum and in the prepyloric region                            of the stomach. Biopsies were taken with a cold  forceps for Helicobacter pylori testing.                           The cardia and gastric fundus were normal on                            retroflexion.                           The examined duodenum was normal. Complications:            No immediate complications. Estimated Blood Loss:     Estimated blood loss was minimal. Impression:               - LA Grade C reflux esophagitis with no bleeding.                           - 2 cm hiatal hernia.                           - Gastritis. Biopsied.                           - Normal examined duodenum. Recommendation:           - Patient has a contact number available for                            emergencies. The signs and symptoms of potential                            delayed complications were discussed with the                            patient. Return to normal activities tomorrow.                            Written discharge instructions were provided to the                            patient.                           - Resume  previous diet.                           - Continue present medications.                           - Await pathology results.                           - Follow an antireflux regimen.                           - Use Protonix (pantoprazole) 40 mg PO daily for 3  months.                           - No ibuprofen, naproxen, or other non-steroidal                            anti-inflammatory drugs.                           - Stop probiotics Mauri Pole, MD 03/31/2022 2:06:12 PM This report has been signed electronically.

## 2022-04-01 ENCOUNTER — Telehealth: Payer: Self-pay | Admitting: *Deleted

## 2022-04-01 ENCOUNTER — Encounter (INDEPENDENT_AMBULATORY_CARE_PROVIDER_SITE_OTHER): Payer: Self-pay | Admitting: Family Medicine

## 2022-04-01 ENCOUNTER — Ambulatory Visit (INDEPENDENT_AMBULATORY_CARE_PROVIDER_SITE_OTHER): Payer: 59 | Admitting: Family Medicine

## 2022-04-01 VITALS — BP 122/85 | HR 84 | Temp 97.6°F | Ht 63.0 in | Wt 224.0 lb

## 2022-04-01 DIAGNOSIS — E669 Obesity, unspecified: Secondary | ICD-10-CM

## 2022-04-01 DIAGNOSIS — I10 Essential (primary) hypertension: Secondary | ICD-10-CM

## 2022-04-01 DIAGNOSIS — R5383 Other fatigue: Secondary | ICD-10-CM

## 2022-04-01 DIAGNOSIS — Z1331 Encounter for screening for depression: Secondary | ICD-10-CM | POA: Diagnosis not present

## 2022-04-01 DIAGNOSIS — R0609 Other forms of dyspnea: Secondary | ICD-10-CM | POA: Insufficient documentation

## 2022-04-01 DIAGNOSIS — R0602 Shortness of breath: Secondary | ICD-10-CM

## 2022-04-01 DIAGNOSIS — E66812 Obesity, class 2: Secondary | ICD-10-CM

## 2022-04-01 DIAGNOSIS — K5909 Other constipation: Secondary | ICD-10-CM | POA: Diagnosis not present

## 2022-04-01 DIAGNOSIS — K2970 Gastritis, unspecified, without bleeding: Secondary | ICD-10-CM

## 2022-04-01 DIAGNOSIS — Z6839 Body mass index (BMI) 39.0-39.9, adult: Secondary | ICD-10-CM

## 2022-04-01 DIAGNOSIS — R140 Abdominal distension (gaseous): Secondary | ICD-10-CM

## 2022-04-01 NOTE — Telephone Encounter (Signed)
  Follow up Call-     03/31/2022   12:37 PM  Call back number  Post procedure Call Back phone  # [phone]  Permission to leave phone message Yes     Patient questions:  Do you have a fever, pain , or abdominal swelling? No. Pain Score  0 *  Have you tolerated food without any problems? Yes.    Have you been able to return to your normal activities? Yes.    Do you have any questions about your discharge instructions: Diet   No. Medications  No. Follow up visit  No.  Do you have questions or concerns about your Care? No.  Actions: * If pain score is 4 or above: No action needed, pain <4.

## 2022-04-01 NOTE — Telephone Encounter (Signed)
Hi Dr. Silverio Decamp,  Please see notes below and let me know if you also agree with the switch in care for this patient.  Thank you.

## 2022-04-02 LAB — CBC WITH DIFFERENTIAL/PLATELET
Basophils Absolute: 0 10*3/uL (ref 0.0–0.2)
Basos: 0 %
EOS (ABSOLUTE): 0 10*3/uL (ref 0.0–0.4)
Eos: 1 %
Hematocrit: 40.6 % (ref 34.0–46.6)
Hemoglobin: 13.5 g/dL (ref 11.1–15.9)
Immature Grans (Abs): 0 10*3/uL (ref 0.0–0.1)
Immature Granulocytes: 0 %
Lymphocytes Absolute: 1.3 10*3/uL (ref 0.7–3.1)
Lymphs: 26 %
MCH: 27.8 pg (ref 26.6–33.0)
MCHC: 33.3 g/dL (ref 31.5–35.7)
MCV: 84 fL (ref 79–97)
Monocytes Absolute: 0.3 10*3/uL (ref 0.1–0.9)
Monocytes: 6 %
Neutrophils Absolute: 3.4 10*3/uL (ref 1.4–7.0)
Neutrophils: 67 %
Platelets: 164 10*3/uL (ref 150–450)
RBC: 4.86 x10E6/uL (ref 3.77–5.28)
RDW: 13.6 % (ref 11.7–15.4)
WBC: 5 10*3/uL (ref 3.4–10.8)

## 2022-04-02 LAB — TSH: TSH: 0.529 u[IU]/mL (ref 0.450–4.500)

## 2022-04-02 LAB — LIPID PANEL
Chol/HDL Ratio: 3.1 ratio (ref 0.0–4.4)
Cholesterol, Total: 170 mg/dL (ref 100–199)
HDL: 55 mg/dL (ref >39)
LDL Chol Calc (NIH): 102 mg/dL — ABNORMAL HIGH (ref 0–99)
Triglycerides: 65 mg/dL (ref 0–149)
VLDL Cholesterol Cal: 13 mg/dL (ref 5–40)

## 2022-04-02 LAB — INSULIN, RANDOM: INSULIN: 10.5 u[IU]/mL (ref 2.6–24.9)

## 2022-04-02 LAB — COMPREHENSIVE METABOLIC PANEL
ALT: 14 IU/L (ref 0–32)
AST: 20 IU/L (ref 0–40)
Albumin/Globulin Ratio: 1.8 (ref 1.2–2.2)
Albumin: 4.4 g/dL (ref 3.8–4.9)
Alkaline Phosphatase: 60 IU/L (ref 44–121)
BUN/Creatinine Ratio: 12 (ref 9–23)
BUN: 7 mg/dL (ref 6–24)
Bilirubin Total: 0.3 mg/dL (ref 0.0–1.2)
CO2: 22 mmol/L (ref 20–29)
Calcium: 9.3 mg/dL (ref 8.7–10.2)
Chloride: 100 mmol/L (ref 96–106)
Creatinine, Ser: 0.59 mg/dL (ref 0.57–1.00)
Globulin, Total: 2.5 g/dL (ref 1.5–4.5)
Glucose: 86 mg/dL (ref 70–99)
Potassium: 4 mmol/L (ref 3.5–5.2)
Sodium: 140 mmol/L (ref 134–144)
Total Protein: 6.9 g/dL (ref 6.0–8.5)
eGFR: 108 mL/min/{1.73_m2} (ref >59)

## 2022-04-02 LAB — HEMOGLOBIN A1C
Est. average glucose Bld gHb Est-mCnc: 123 mg/dL
Hgb A1c MFr Bld: 5.9 % — ABNORMAL HIGH (ref 4.8–5.6)

## 2022-04-02 LAB — VITAMIN D 25 HYDROXY (VIT D DEFICIENCY, FRACTURES): Vit D, 25-Hydroxy: 40.9 ng/mL (ref 30.0–100.0)

## 2022-04-02 LAB — VITAMIN B12: Vitamin B-12: 590 pg/mL (ref 232–1245)

## 2022-04-02 LAB — T4, FREE: Free T4: 1.13 ng/dL (ref 0.82–1.77)

## 2022-04-11 ENCOUNTER — Encounter: Payer: Self-pay | Admitting: Gastroenterology

## 2022-04-15 NOTE — Progress Notes (Signed)
Chief Complaint:   OBESITY [name] (MR# [id number]) is a 51 y.o. female who presents for evaluation and treatment of obesity and related comorbidities. Current BMI is Body mass index is 39.68 kg/m. [name] has been struggling with her weight for many years and has been unsuccessful in either losing weight, maintaining weight loss, or reaching her healthy weight goal.  [name] was last seen by me on 01/03/2022.  She is off Ozempic since May and is still having bloating.  Saw GI, had EGD/colonoscopy yesterday.  Off phentermine.  Works long hours.  Has protein smoothie for breakfast, cafeteria food for lunch, and fast food for dinner.  Eats few snacks.  Denies large portions or cravings.  [name] is currently in the action stage of change and ready to dedicate time achieving and maintaining a healthier weight. [name] is interested in becoming our patient and working on intensive lifestyle modifications including (but not limited to) diet and exercise for weight loss.  [name]'s habits were reviewed today and are as follows: her desired weight loss is 59 lbs, she has been heavy most of her life, she started gaining weight in her late 20's, her heaviest weight ever was 232 pounds, she has significant food cravings issues, she snacks frequently in the evenings, she skips meals frequently, she is frequently drinking liquids with calories, she frequently makes poor food choices, she frequently eats larger portions than normal, she has binge eating behaviors, and she struggles with emotional eating.  Depression Screen [name]'s Food and Mood (modified PHQ-9) score was 9.     04/01/2022    8:59 AM  Depression screen PHQ 2/9  Decreased Interest 1  Down, Depressed, Hopeless 1  PHQ - 2 Score 2  Altered sleeping 1  Tired, decreased energy 2  Change in appetite 2  Feeling bad or failure about yourself  0  Trouble concentrating 2  Moving slowly or fidgety/restless 0  Suicidal thoughts 0   PHQ-9 Score 9  Difficult doing work/chores Not difficult at all   Subjective:   1. Other fatigue [name] admits to daytime somnolence and admits to waking up still tired. Patient has a history of symptoms of daytime fatigue and morning fatigue. [name] generally gets 5 or 6 hours of sleep per night, and states that she has generally restful sleep. Snoring is not present. Apneic episodes are not present. Epworth Sleepiness Score is 1.   2. SOBOE (shortness of breath on exertion) [name] notes increasing shortness of breath with exercising and seems to be worsening over time with weight gain. She notes getting out of breath sooner with activity than she used to. This has not gotten worse recently. [name] denies shortness of breath at rest or orthopnea.  3. Essential hypertension [name]'s blood pressure is well controlled on Tribenzor 40-5-12.5 mg daily.  She denies chest pain.  4. Gastritis, presence of bleeding unspecified, unspecified chronicity, unspecified gastritis type Gastritis was found on EGD with Dr. Silverio Decamp yesterday with hiatal hernia. [name] was started on Protonix 40 mg daily.  5. Other constipation GI increased Linzess to 290 mg once daily.  [name] has increased her water intake.  6. Abdominal bloating [name] tried elimination diet, dairy and gluten.  She did not see a difference.  She has decreased high-fiber foods, carbonation, and stopped probiotic.  SIBO is possible.  Assessment/Plan:   1. Other fatigue [name] does feel that her weight is causing her energy to be lower than it should be. Fatigue may be related to  obesity, depression or many other causes. Labs will be ordered, and in the meanwhile, [name] will focus on self care including making healthy food choices, increasing physical activity and focusing on stress reduction.  - VITAMIN D 25 Hydroxy (Vit-D Deficiency, Fractures) - TSH - T4, free - Lipid panel - Insulin, random - Hemoglobin A1c -  Comprehensive metabolic panel - Vitamin W09 - CBC with Differential/Platelet  2. SOBOE (shortness of breath on exertion) [name] does feel that she gets out of breath more easily that she used to when she exercises. [name]'s shortness of breath appears to be obesity related and exercise induced. She has agreed to work on weight loss and gradually increase exercise to treat her exercise induced shortness of breath. Will continue to monitor closely.  3. Essential hypertension [name] will continue her blood pressure medications per her PCP.  4. Gastritis, presence of bleeding unspecified, unspecified chronicity, unspecified gastritis type Look for improvements on Protonix.  5. Other constipation [name] will watch for constipation with increased dose of Linzess with decreased fiber intake.  6. Abdominal bloating [name] is to eat small portions and eating every 2-3 hours with decreased fiber intake, and avoid high sugar items.  7. Depression screening [name] had a positive depression screening. Depression is commonly associated with obesity and often results in emotional eating behaviors. We will monitor this closely and work on CBT to help improve the non-hunger eating patterns. Referral to Psychology may be required if no improvement is seen as she continues in our clinic.  8. Obesity, current BMI 39.8 [name] is currently in the action stage of change and her goal is to continue with weight loss efforts. I recommend [name] begin the structured treatment plan as follows:  She has agreed to the Category 3 Plan and keeping a food journal and adhering to recommended goals of 1500-1700 calories and 90-100 grams of protein daily.  [name] is to stay off Ozempic and phentermine.  Exercise goals: Recommended wearing a smart watch.  Behavioral modification strategies: increasing lean protein intake, increasing water intake, decreasing eating out, no skipping meals, meal planning and cooking  strategies, better snacking choices, and decreasing junk food.  She was informed of the importance of frequent follow-up visits to maximize her success with intensive lifestyle modifications for her multiple health conditions. She was informed we would discuss her lab results at her next visit unless there is a critical issue that needs to be addressed sooner. [name] agreed to keep her next visit at the agreed upon time to discuss these results.  Objective:   Blood pressure 122/85, pulse 84, temperature 97.6 F (36.4 C), height '5\' 3"'$  (1.6 m), weight 224 lb (101.6 kg), SpO2 100 %. Body mass index is 39.68 kg/m.  EKG: Normal sinus rhythm, rate 85 BPM.  Indirect Calorimeter completed today shows a VO2 of 227 and a REE of 1570.  Her calculated basal metabolic rate is 8119 thus her basal metabolic rate is worse than expected.  General: Cooperative, alert, well developed, in no acute distress. HEENT: Conjunctivae and lids unremarkable. Cardiovascular: Regular rhythm.  Lungs: Normal work of breathing. Neurologic: No focal deficits.   Lab Results  Component Value Date   CREATININE 0.59 04/01/2022   BUN 7 04/01/2022   NA 140 04/01/2022   K 4.0 04/01/2022   CL 100 04/01/2022   CO2 22 04/01/2022   Lab Results  Component Value Date   ALT 14 04/01/2022   AST 20 04/01/2022   ALKPHOS 60 04/01/2022   BILITOT  0.3 04/01/2022   Lab Results  Component Value Date   HGBA1C 5.9 (H) 04/01/2022   HGBA1C 6.0 11/25/2018   HGBA1C 5.8 02/05/2018   HGBA1C 5.8 (H) 10/07/2017   HGBA1C 6.1 10/23/2016   Lab Results  Component Value Date   INSULIN 10.5 04/01/2022   INSULIN 7.8 10/07/2017   Lab Results  Component Value Date   TSH 0.529 04/01/2022   Lab Results  Component Value Date   CHOL 170 04/01/2022   HDL 55 04/01/2022   LDLCALC 102 (H) 04/01/2022   TRIG 65 04/01/2022   CHOLHDL 3.1 04/01/2022   Lab Results  Component Value Date   WBC 5.0 04/01/2022   HGB 13.5 04/01/2022   HCT 40.6  04/01/2022   MCV 84 04/01/2022   PLT 164 04/01/2022   Lab Results  Component Value Date   IRON 104 09/30/2017   TIBC 344 09/30/2017   FERRITIN 33 09/30/2017   Attestation Statements:   Reviewed by clinician on day of visit: allergies, medications, problem list, medical history, surgical history, family history, social history, and previous encounter notes.   Wilhemena Durie, am acting as transcriptionist for Loyal Gambler, DO.  I have reviewed the above documentation for accuracy and completeness, and I agree with the above. Dell Ponto, DO

## 2022-04-16 ENCOUNTER — Encounter (INDEPENDENT_AMBULATORY_CARE_PROVIDER_SITE_OTHER): Payer: Self-pay | Admitting: Family Medicine

## 2022-04-16 ENCOUNTER — Ambulatory Visit (INDEPENDENT_AMBULATORY_CARE_PROVIDER_SITE_OTHER): Payer: 59 | Admitting: Family Medicine

## 2022-04-16 VITALS — BP 132/83 | HR 83 | Temp 98.0°F | Ht 63.0 in | Wt 222.0 lb

## 2022-04-16 DIAGNOSIS — E559 Vitamin D deficiency, unspecified: Secondary | ICD-10-CM | POA: Diagnosis not present

## 2022-04-16 DIAGNOSIS — K2970 Gastritis, unspecified, without bleeding: Secondary | ICD-10-CM

## 2022-04-16 DIAGNOSIS — E669 Obesity, unspecified: Secondary | ICD-10-CM

## 2022-04-16 DIAGNOSIS — Z6839 Body mass index (BMI) 39.0-39.9, adult: Secondary | ICD-10-CM

## 2022-04-16 DIAGNOSIS — R7303 Prediabetes: Secondary | ICD-10-CM

## 2022-04-16 DIAGNOSIS — R140 Abdominal distension (gaseous): Secondary | ICD-10-CM

## 2022-04-16 DIAGNOSIS — Z6838 Body mass index (BMI) 38.0-38.9, adult: Secondary | ICD-10-CM

## 2022-04-16 MED ORDER — VITAMIN D (ERGOCALCIFEROL) 1.25 MG (50000 UNIT) PO CAPS: 50000.0000 [IU] | ORAL_CAPSULE | ORAL | 0 refills | Status: DC

## 2022-04-16 NOTE — Telephone Encounter (Signed)
Its fine

## 2022-04-21 NOTE — Progress Notes (Unsigned)
Chief Complaint:   OBESITY [name] is here to discuss her progress with her obesity treatment plan along with follow-up of her obesity related diagnoses. [name] is on the Category 3 Plan and states she is following her eating plan approximately 50% of the time. [name] states she is not exercising.  Today's visit was #: 2 Starting weight: 224 lbs Starting date: 03/31/2022 Today's weight: 222 lbs Today's date: 04/16/2022 Total lbs lost to date: 2 lbs Total lbs lost since last in-office visit: 2 lbs  Interim History: Has been working nights this week.  Bowels are more regular with Linzess.  Having more sugar cravings, especially with stress.  Work has been stressful.  Lacking fruits and veggies.  Schedule at work has kept her from getting and snack in.  Had a weekend trip away.   Subjective:   1. Prediabetes Discussed labs with patient today. A1c 5.9, off Ozempic for 3 months.  Previously had GI upset on Metformin.   2. Vitamin D deficiency Discussed labs with patient today. On OTC Vitamin D 2,000 IU daily.  Vitamin D level 40.9.  3. Abdominal bloating Improving on Linzess.  Denies any worsening with bread or dairy on meal plan. Has been evaluated by GI.    4. Gastritis, presence of bleeding unspecified, unspecified chronicity, unspecified gastritis type EGD done by Dr Halford Decamp, 03/31/2022 showed gastritis.  On pantoprazole 40 mg daily, helping.  Has been off Ozempic since May.   Assessment/Plan:   1. Prediabetes Continue to work on decreasing intake of sugar.  Increase walking time, weight loss.   2. Vitamin D deficiency Discontinue OTC Vitamin D.   Begin - Vitamin D, Ergocalciferol, (DRISDOL) 1.25 MG (50000 UNIT) CAPS capsule; Take 1 capsule (50,000 Units total) by mouth every 7 (seven) days.  Dispense: 5 capsule; Refill: 0  3. Abdominal bloating Continue Linzess daily.   4. Gastritis, presence of bleeding unspecified, unspecified chronicity, unspecified gastritis  type Stay off Ozempic, continue PPI daily per GI.   5. Obesity,current BMI 39.4 Eating out handout given.  Reviewed [employer] nutrition facts.   [name] is currently in the action stage of change. As such, her goal is to continue with weight loss efforts. She has agreed to the Category 3 Plan.   Exercise goals:  Track daily steps.   Behavioral modification strategies: increasing lean protein intake, increasing vegetables, increasing water intake, increasing high fiber foods, decreasing eating out, no skipping meals, keeping healthy foods in the home, better snacking choices, and travel eating strategies.  [name] has agreed to follow-up with our clinic in 3-4 weeks. She was informed of the importance of frequent follow-up visits to maximize her success with intensive lifestyle modifications for her multiple health conditions.   Objective:   Blood pressure 132/83, pulse 83, temperature 98 F (36.7 C), height '5\' 3"'$  (1.6 m), weight 222 lb (100.7 kg), SpO2 99 %. Body mass index is 39.33 kg/m.  General: Cooperative, alert, well developed, in no acute distress. HEENT: Conjunctivae and lids unremarkable. Cardiovascular: Regular rhythm.  Lungs: Normal work of breathing. Neurologic: No focal deficits.   Lab Results  Component Value Date   CREATININE 0.59 04/01/2022   BUN 7 04/01/2022   NA 140 04/01/2022   K 4.0 04/01/2022   CL 100 04/01/2022   CO2 22 04/01/2022   Lab Results  Component Value Date   ALT 14 04/01/2022   AST 20 04/01/2022   ALKPHOS 60 04/01/2022   BILITOT 0.3 04/01/2022   Lab Results  Component Value Date   HGBA1C 5.9 (H) 04/01/2022   HGBA1C 6.0 11/25/2018   HGBA1C 5.8 02/05/2018   HGBA1C 5.8 (H) 10/07/2017   HGBA1C 6.1 10/23/2016   Lab Results  Component Value Date   INSULIN 10.5 04/01/2022   INSULIN 7.8 10/07/2017   Lab Results  Component Value Date   TSH 0.529 04/01/2022   Lab Results  Component Value Date   CHOL 170 04/01/2022   HDL 55  04/01/2022   LDLCALC 102 (H) 04/01/2022   TRIG 65 04/01/2022   CHOLHDL 3.1 04/01/2022   Lab Results  Component Value Date   VD25OH 40.9 04/01/2022   VD25OH 34.82 11/25/2018   VD25OH 31.69 09/30/2017   Lab Results  Component Value Date   WBC 5.0 04/01/2022   HGB 13.5 04/01/2022   HCT 40.6 04/01/2022   MCV 84 04/01/2022   PLT 164 04/01/2022   Lab Results  Component Value Date   IRON 104 09/30/2017   TIBC 344 09/30/2017   FERRITIN 33 09/30/2017    Attestation Statements:   Reviewed by clinician on day of visit: allergies, medications, problem list, medical history, surgical history, family history, social history, and previous encounter notes.  I, Davy Pique, am acting as Location manager for Loyal Gambler, DO.  I have reviewed the above documentation for accuracy and completeness, and I agree with the above. Dell Ponto, DO

## 2022-04-23 ENCOUNTER — Encounter: Payer: Self-pay | Admitting: Internal Medicine

## 2022-04-23 ENCOUNTER — Ambulatory Visit: Payer: 59 | Admitting: Internal Medicine

## 2022-04-23 DIAGNOSIS — K2970 Gastritis, unspecified, without bleeding: Secondary | ICD-10-CM | POA: Diagnosis not present

## 2022-04-23 DIAGNOSIS — M7551 Bursitis of right shoulder: Secondary | ICD-10-CM

## 2022-04-23 DIAGNOSIS — F419 Anxiety disorder, unspecified: Secondary | ICD-10-CM | POA: Diagnosis not present

## 2022-04-23 DIAGNOSIS — M7550 Bursitis of unspecified shoulder: Secondary | ICD-10-CM | POA: Insufficient documentation

## 2022-04-23 DIAGNOSIS — R140 Abdominal distension (gaseous): Secondary | ICD-10-CM | POA: Diagnosis not present

## 2022-04-23 DIAGNOSIS — M5416 Radiculopathy, lumbar region: Secondary | ICD-10-CM

## 2022-04-23 DIAGNOSIS — R110 Nausea: Secondary | ICD-10-CM

## 2022-04-23 DIAGNOSIS — Z23 Encounter for immunization: Secondary | ICD-10-CM

## 2022-04-23 DIAGNOSIS — E538 Deficiency of other specified B group vitamins: Secondary | ICD-10-CM

## 2022-04-23 DIAGNOSIS — E559 Vitamin D deficiency, unspecified: Secondary | ICD-10-CM

## 2022-04-23 MED ORDER — CYCLOBENZAPRINE HCL 5 MG PO TABS: ORAL_TABLET | ORAL | 3 refills | Status: DC

## 2022-04-23 MED ORDER — ALPRAZOLAM 0.5 MG PO TABS: ORAL_TABLET | ORAL | 3 refills | Status: DC

## 2022-04-23 NOTE — Patient Instructions (Signed)
Blue-Emu cream was -- use 2-3 times a day ? ?

## 2022-04-23 NOTE — Assessment & Plan Note (Signed)
On B12 

## 2022-04-23 NOTE — Assessment & Plan Note (Signed)
On Flexeril at hs

## 2022-04-23 NOTE — Assessment & Plan Note (Signed)
Xanax prn  Potential benefits of a long term benzodiazepines  use as well as potential risks  and complications were explained to the patient and were aknowledged. 

## 2022-04-23 NOTE — Assessment & Plan Note (Signed)
On Protonix now Pt had EGD 8/23

## 2022-04-23 NOTE — Assessment & Plan Note (Signed)
8/23 R Blue-Emu cream was recommended to use 2-3 times a day Will inject if needed ROM exercises

## 2022-04-23 NOTE — Progress Notes (Signed)
Subjective:  Patient ID: [name], female    DOB: 29-Jul-1971  Age: 51 y.o. MRN: [id number]  CC: Follow-up (Pt wants get her flu shot and pneumonia. Pt also states she wants her right arm to be looked at she did say it hurts while lifting it. )   HPI Shadiyah Wernli presents for gastroparesis, LBP, anxiety C/o R shoulder pain x 3 wks  Outpatient Medications Prior to Visit  Medication Sig Dispense Refill   Ascorbic Acid (VITAMIN C PO) Take by mouth.     chlorhexidine (HIBICLENS) 4 % external liquid Apply topically daily as needed. Use 3/week 1000 mL 3   Cholecalciferol 25 MCG (1000 UT) tablet Take 2,000 Units by mouth daily.     Doxylamine-Phenylephrine-APAP (VICKS SINEX NIGHTTIME PO) Take by mouth.     fluticasone (FLONASE) 50 MCG/ACT nasal spray SPRAY 2 SPRAYS INTO EACH NOSTRIL EVERY DAY 48 mL 0   hyoscyamine (LEVSIN SL) 0.125 MG SL tablet Place 1 tablet (0.125 mg total) under the tongue every 4 (four) hours as needed. 45 tablet 1   linaclotide (LINZESS) 290 MCG CAPS capsule Take 1 capsule (290 mcg total) by mouth daily before breakfast. 30 capsule 3   montelukast (SINGULAIR) 10 MG tablet TAKE 1 TABLET BY MOUTH EVERYDAY AT BEDTIME 90 tablet 1   mupirocin cream (BACTROBAN) 2 % Apply 1 application topically 2 (two) times daily. 30 g 3   naproxen (NAPROSYN) 500 MG tablet TAKE 1 TABLET BY MOUTH TWICE A DAY AS NEEDED FOR MODERATE PAIN OR HEADACHE 60 tablet 3   norethindrone-ethinyl estradiol (FYAVOLV) 1-5 MG-MCG TABS tablet Take by mouth daily.     Olmesartan-amLODIPine-HCTZ 40-5-12.5 MG TABS TAKE 1 TABLET BY MOUTH EVERY DAY 90 tablet 3   pantoprazole (PROTONIX) 40 MG tablet Take 1 tablet (40 mg total) by mouth daily. 30 tablet 3   Potassium Chloride ER 20 MEQ TBCR TAKE 1 TABLET BY MOUTH EVERY DAY 90 tablet 1   Prenatal Vit-Fe Fumarate-FA (M-NATAL PLUS) 27-1 MG TABS TAKE 1 TABLET BY MOUTH EVERY DAY 90 tablet 3   rizatriptan (MAXALT) 10 MG tablet TAKE 1 TABLET BY MOUTH ONCE AS NEEDED  FOR UP TO 1 DOSE FOR MIGRAINE. MAY REPEAT IN 2 HOURS 12 tablet 5   triamcinolone (KENALOG) 0.1 % paste Use as directed 1 application. in the mouth or throat 3 (three) times daily. On canker sores 5 g 1   valACYclovir (VALTREX) 1000 MG tablet Take 1 tablet (1,000 mg total) by mouth 3 (three) times daily. 21 tablet 1   Vitamin D, Ergocalciferol, (DRISDOL) 1.25 MG (50000 UNIT) CAPS capsule Take 1 capsule (50,000 Units total) by mouth every 7 (seven) days. 5 capsule 0   ALPRAZolam (XANAX) 0.5 MG tablet TAKE 1 TO 2 TABLETS BY MOUTH EVERY DAY AT BEDTIME PRN 60 tablet 3   cyclobenzaprine (FLEXERIL) 5 MG tablet TAKE 1 TABLET BY MOUTH THREE TIMES A DAY AS NEEDED FOR MUSCLE SPASMS 60 tablet 2   No facility-administered medications prior to visit.    ROS: Review of Systems  Constitutional:  Negative for activity change, appetite change, chills, fatigue and unexpected weight change.  HENT:  Negative for congestion, mouth sores and sinus pressure.   Eyes:  Negative for visual disturbance.  Respiratory:  Negative for cough and chest tightness.   Gastrointestinal:  Positive for constipation. Negative for abdominal distention, abdominal pain, diarrhea, nausea and vomiting.  Genitourinary:  Negative for difficulty urinating, frequency and vaginal pain.  Musculoskeletal:  Positive for arthralgias and  back pain. Negative for gait problem.  Skin:  Negative for pallor and rash.  Neurological:  Negative for dizziness, tremors, weakness, numbness and headaches.  Psychiatric/Behavioral:  Negative for confusion and sleep disturbance.     Objective:  BP 120/78 (BP Location: Left Arm, Patient Position: Sitting, Cuff Size: Normal)   Pulse 88   Temp 98.2 F (36.8 C) (Oral)   Ht '5\' 3"'$  (1.6 m)   Wt 230 lb 4 oz (104.4 kg)   SpO2 98%   BMI 40.79 kg/m   BP Readings from Last 3 Encounters:  04/23/22 120/78  04/16/22 132/83  04/01/22 122/85    Wt Readings from Last 3 Encounters:  04/23/22 230 lb 4 oz (104.4  kg)  04/16/22 222 lb (100.7 kg)  04/01/22 224 lb (101.6 kg)    Physical Exam Constitutional:      General: She is not in acute distress.    Appearance: She is well-developed. She is obese.  HENT:     Head: Normocephalic.     Right Ear: External ear normal.     Left Ear: External ear normal.     Nose: Nose normal.  Eyes:     General:        Right eye: No discharge.        Left eye: No discharge.     Conjunctiva/sclera: Conjunctivae normal.     Pupils: Pupils are equal, round, and reactive to light.  Neck:     Thyroid: No thyromegaly.     Vascular: No JVD.     Trachea: No tracheal deviation.  Cardiovascular:     Rate and Rhythm: Normal rate and regular rhythm.     Heart sounds: Normal heart sounds.  Pulmonary:     Effort: No respiratory distress.     Breath sounds: No stridor. No wheezing.  Abdominal:     General: Bowel sounds are normal. There is no distension.     Palpations: Abdomen is soft. There is no mass.     Tenderness: There is no abdominal tenderness. There is no guarding or rebound.  Musculoskeletal:        General: No tenderness.     Cervical back: Normal range of motion and neck supple. No rigidity.  Lymphadenopathy:     Cervical: No cervical adenopathy.  Skin:    Findings: No erythema or rash.  Neurological:     Mental Status: She is oriented to person, place, and time.     Cranial Nerves: No cranial nerve deficit.     Motor: No abnormal muscle tone.     Coordination: Coordination normal.     Deep Tendon Reflexes: Reflexes normal.  Psychiatric:        Behavior: Behavior normal.        Thought Content: Thought content normal.        Judgment: Judgment normal.   R shoulder w/pain Abd is smaller now, NT    A total time of 45 minutes was spent preparing to see the patient, reviewing tests, x-rays, operative reports and other medical records.  Also, obtaining history and performing comprehensive physical exam.  Additionally, counseling the patient  regarding the above listed issues.   Finally, documenting clinical information in the health records, coordination of care, educating the patient - gastroparessis, rotator cuff issue, anxiety   Lab Results  Component Value Date   WBC 5.0 04/01/2022   HGB 13.5 04/01/2022   HCT 40.6 04/01/2022   PLT 164 04/01/2022   GLUCOSE 86 04/01/2022  CHOL 170 04/01/2022   TRIG 65 04/01/2022   HDL 55 04/01/2022   LDLCALC 102 (H) 04/01/2022   ALT 14 04/01/2022   AST 20 04/01/2022   NA 140 04/01/2022   K 4.0 04/01/2022   CL 100 04/01/2022   CREATININE 0.59 04/01/2022   BUN 7 04/01/2022   CO2 22 04/01/2022   TSH 0.529 04/01/2022   INR 0.9 RATIO 03/15/2007   HGBA1C 5.9 (H) 04/01/2022    CT Abdomen Pelvis W Contrast  Result Date: 01/13/2022 CLINICAL DATA:  Severe abdominal bloating, upper abdominal mass. Bowel obstruction. EXAM: CT ABDOMEN AND PELVIS WITH CONTRAST TECHNIQUE: Multidetector CT imaging of the abdomen and pelvis was performed using the standard protocol following bolus administration of intravenous contrast. RADIATION DOSE REDUCTION: This exam was performed according to the departmental dose-optimization program which includes automated exposure control, adjustment of the mA and/or kV according to patient size and/or use of iterative reconstruction technique. CONTRAST:  144m OMNIPAQUE IOHEXOL 300 MG/ML  SOLN COMPARISON:  None Available. FINDINGS: Lower chest: No acute abnormality. Hepatobiliary: No focal liver abnormality is seen. No gallstones, gallbladder wall thickening, or biliary dilatation. Pancreas: Unremarkable Spleen: Unremarkable Adrenals/Urinary Tract: Stable 2.2 cm right adrenal adenoma. Stable 13 mm more posterior right adrenal nodule, previously characterized as a lipid poor adenoma. Left adrenal gland is unremarkable. The kidneys are normal in size and position. Simple cortical cyst noted within the lower pole the left kidney. No follow-up imaging is recommended for this  lesion. The kidneys are otherwise unremarkable. Bladder unremarkable. Stomach/Bowel: Mild sigmoid diverticulosis. A small umbilical hernia contains a single unremarkable loop of small bowel. The stomach, small bowel, and large bowel are otherwise unremarkable. No evidence of obstruction or focal inflammation. No free intraperitoneal gas or fluid. Appendix normal. Vascular/Lymphatic: Circumaortic left renal vein. The abdominal vasculature is otherwise unremarkable. No pathologic adenopathy within the abdomen and pelvis. Reproductive: The uterus is lobulated and demonstrates heterogeneous enhancement with scattered coarse calcifications in keeping with multiple underlying uterine fibroids. The pelvic organs are otherwise unremarkable. Other: None Musculoskeletal: Degenerative changes seen within the lumbar spine. No acute bone abnormality. No lytic or blastic bone lesion. IMPRESSION: No acute intra-abdominal pathology identified. No bowel obstruction. No definite radiographic explanation for the patient's reported symptoms. Stable 2.2 cm benign right adrenal adenoma. Stable 1.3 cm more posterior right adrenal nodule, previously characterized as a lipid poor adenoma. Mild sigmoid diverticulosis without superimposed acute inflammatory change. Fibroid uterus. Small umbilical hernia containing and unremarkable loop of small bowel, similar to prior examination. Electronically Signed   By: AFidela SalisburyM.D.   On: 01/13/2022 02:02    Assessment & Plan:   Problem List Items Addressed This Visit     Abdominal bloating    ?gastroparesis due to Ozempic Pt had EGD+colon 8/23      Anxiety disorder    Xanax prn  Potential benefits of a long term benzodiazepines  use as well as potential risks  and complications were explained to the patient and were aknowledged.      Relevant Medications   ALPRAZolam (XANAX) 0.5 MG tablet   B12 deficiency    On B12      Gastritis    On Protonix now Pt had EGD 8/23       Lumbar radiculopathy    On Flexeril at hs      Relevant Medications   ALPRAZolam (XANAX) 0.5 MG tablet   cyclobenzaprine (FLEXERIL) 5 MG tablet   Nausea    ?gastroparesis due to Ozempic -  no sx's now Pt had EGD+colon 8/23      Shoulder bursitis    8/23 R Blue-Emu cream was recommended to use 2-3 times a day Will inject if needed ROM exercises      Vitamin D deficiency      Meds ordered this encounter  Medications   ALPRAZolam (XANAX) 0.5 MG tablet    Sig: TAKE 1 TO 2 TABLETS BY MOUTH EVERY DAY AT BEDTIME PRN    Dispense:  60 tablet    Refill:  3    Not to exceed 5 additional fills before 05/02/2022   cyclobenzaprine (FLEXERIL) 5 MG tablet    Sig: TAKE 1 TABLET BY MOUTH THREE TIMES A DAY AS NEEDED FOR MUSCLE SPASMS    Dispense:  60 tablet    Refill:  3      Follow-up: Return in about 3 months (around 07/23/2022) for a follow-up visit.  Walker Kehr, MD

## 2022-04-23 NOTE — Assessment & Plan Note (Signed)
?  gastroparesis due to Ozempic - no sx's now Pt had EGD+colon 8/23

## 2022-04-23 NOTE — Assessment & Plan Note (Signed)
?  gastroparesis due to Ozempic Pt had EGD+colon 8/23

## 2022-05-10 ENCOUNTER — Encounter (INDEPENDENT_AMBULATORY_CARE_PROVIDER_SITE_OTHER): Payer: Self-pay

## 2022-05-11 ENCOUNTER — Encounter: Payer: Self-pay | Admitting: Plastic Surgery

## 2022-05-12 NOTE — Telephone Encounter (Signed)
Patient is a 51 year old female who underwent bilateral breast reduction with Dr. Marla Roe on 06/19/2021.  She reports that she has been doing well, however reports after removing her bra the other day she noticed a dark spot and small opening along the lower part of her breast.  She reports that it is tender.  She is not having any infectious symptoms.  She is not noticing any active drainage or increased redness surrounding the area.  She has an appointment next week with Dr. Marla Roe.  We discussed recommendations for dressings in this area.  I discussed with her reasons to call us and we could see her sooner such as redness, purulent drainage, increased tenderness.  All of her questions were answered to her content.  I recommend calling with any other questions or concerns.

## 2022-05-13 ENCOUNTER — Other Ambulatory Visit: Payer: Self-pay | Admitting: Gastroenterology

## 2022-05-14 ENCOUNTER — Ambulatory Visit (INDEPENDENT_AMBULATORY_CARE_PROVIDER_SITE_OTHER): Payer: 59 | Admitting: Family Medicine

## 2022-05-14 ENCOUNTER — Encounter (INDEPENDENT_AMBULATORY_CARE_PROVIDER_SITE_OTHER): Payer: Self-pay | Admitting: Family Medicine

## 2022-05-14 VITALS — BP 125/85 | HR 83 | Temp 97.9°F | Ht 63.0 in | Wt 229.0 lb

## 2022-05-14 DIAGNOSIS — K2960 Other gastritis without bleeding: Secondary | ICD-10-CM | POA: Diagnosis not present

## 2022-05-14 DIAGNOSIS — Z6838 Body mass index (BMI) 38.0-38.9, adult: Secondary | ICD-10-CM

## 2022-05-14 DIAGNOSIS — Z6841 Body Mass Index (BMI) 40.0 and over, adult: Secondary | ICD-10-CM

## 2022-05-14 DIAGNOSIS — E669 Obesity, unspecified: Secondary | ICD-10-CM | POA: Diagnosis not present

## 2022-05-14 DIAGNOSIS — E559 Vitamin D deficiency, unspecified: Secondary | ICD-10-CM | POA: Diagnosis not present

## 2022-05-14 DIAGNOSIS — R7303 Prediabetes: Secondary | ICD-10-CM | POA: Diagnosis not present

## 2022-05-14 MED ORDER — VITAMIN D (ERGOCALCIFEROL) 1.25 MG (50000 UNIT) PO CAPS: 50000.0000 [IU] | ORAL_CAPSULE | ORAL | 0 refills | Status: DC

## 2022-05-14 MED ORDER — SEMAGLUTIDE(0.25 OR 0.5MG/DOS) 2 MG/3ML ~~LOC~~ SOPN: 0.2500 mg | PEN_INJECTOR | SUBCUTANEOUS | 0 refills | Status: DC

## 2022-05-22 ENCOUNTER — Telehealth: Payer: Self-pay | Admitting: *Deleted

## 2022-05-22 NOTE — Telephone Encounter (Signed)
Received on (05/19/22) via of fax DME Standard Written Order from [facility] [place] requesting signature and return.  Given to provider to sign.    DME Standard Written Order signed and faxed back to [facility] [place].  Confirmation received and copy scanned into the chart.//AB/CMA

## 2022-05-22 NOTE — Progress Notes (Signed)
Chief Complaint:   OBESITY [name] is here to discuss her progress with her obesity treatment plan along with follow-up of her obesity related diagnoses. [name] is on the Category 3 Plan and states she is following her eating plan approximately 10% of the time. [name] states she is not exercising.  Today's visit was #: 3 Starting weight: 224 lbs Starting date: 03/31/2022 Today's weight: 229 lbs Today's date: 05/14/2022 Total lbs lost to date: 0 Total lbs lost since last in-office visit: +7 lbs  Interim History: She traveled for work for 2 weeks.  She was not able to eat on plan.  Stress still contributing to sugar craving.  Weight loss has increased off of phentermine and Ozempic.  She struggles to eat on a schedule and plan meals due to long work days.   Subjective:   1. Pre-diabetes Last A1c 5.9.  She has been off Ozempic for a few months.  She had some bloating and constipation on 2 mg dose.   2. Vitamin D deficiency She is on prescription Vitamin D 50,000 IU weekly.   3. Other specified gastritis, presence of bleeding unspecified, unspecified chronicity Improving on Protonix 40 mg daily and low add diet.   Assessment/Plan:   1. Pre-diabetes Low sugar diet, will need to increase exercise frequency.   Restart - Semaglutide,0.25 or 0.'5MG'$ /DOS, 2 MG/3ML SOPN; Inject 0.25 mg into the skin once a week.  Dispense: 3 mL; Refill: 0  2. Vitamin D deficiency Refill - Vitamin D, Ergocalciferol, (DRISDOL) 1.25 MG (50000 UNIT) CAPS capsule; Take 1 capsule (50,000 Units total) by mouth every 7 (seven) days.  Dispense: 5 capsule; Refill: 0  3. Other specified gastritis, presence of bleeding unspecified, unspecified chronicity Follow up with GI as scheduled.   4. Obesity,current BMI 40.7 Reviewed eating schedule, inserting a mid morning and afternoon snack.  Okay to replace breakfast options with protein smoothie.   [name] is currently in the action stage of change. As such,  her goal is to continue with weight loss efforts. She has agreed to the Category 3 Plan.   Exercise goals: All adults should avoid inactivity. Some physical activity is better than none, and adults who participate in any amount of physical activity gain some health benefits.  Behavioral modification strategies: increasing lean protein intake, increasing vegetables, increasing water intake, decreasing eating out, no skipping meals, meal planning and cooking strategies, keeping healthy foods in the home, better snacking choices, travel eating strategies, planning for success, and decreasing junk food.  [name] has agreed to follow-up with our clinic in 3 weeks. She was informed of the importance of frequent follow-up visits to maximize her success with intensive lifestyle modifications for her multiple health conditions.   Objective:   Blood pressure 125/85, pulse 83, temperature 97.9 F (36.6 C), height '5\' 3"'$  (1.6 m), weight 229 lb (103.9 kg), SpO2 98 %. Body mass index is 40.57 kg/m.  General: Cooperative, alert, well developed, in no acute distress. HEENT: Conjunctivae and lids unremarkable. Cardiovascular: Regular rhythm.  Lungs: Normal work of breathing. Neurologic: No focal deficits.   Lab Results  Component Value Date   CREATININE 0.59 04/01/2022   BUN 7 04/01/2022   NA 140 04/01/2022   K 4.0 04/01/2022   CL 100 04/01/2022   CO2 22 04/01/2022   Lab Results  Component Value Date   ALT 14 04/01/2022   AST 20 04/01/2022   ALKPHOS 60 04/01/2022   BILITOT 0.3 04/01/2022   Lab Results  Component  Value Date   HGBA1C 5.9 (H) 04/01/2022   HGBA1C 6.0 11/25/2018   HGBA1C 5.8 02/05/2018   HGBA1C 5.8 (H) 10/07/2017   HGBA1C 6.1 10/23/2016   Lab Results  Component Value Date   INSULIN 10.5 04/01/2022   INSULIN 7.8 10/07/2017   Lab Results  Component Value Date   TSH 0.529 04/01/2022   Lab Results  Component Value Date   CHOL 170 04/01/2022   HDL 55 04/01/2022    LDLCALC 102 (H) 04/01/2022   TRIG 65 04/01/2022   CHOLHDL 3.1 04/01/2022   Lab Results  Component Value Date   VD25OH 40.9 04/01/2022   VD25OH 34.82 11/25/2018   VD25OH 31.69 09/30/2017   Lab Results  Component Value Date   WBC 5.0 04/01/2022   HGB 13.5 04/01/2022   HCT 40.6 04/01/2022   MCV 84 04/01/2022   PLT 164 04/01/2022   Lab Results  Component Value Date   IRON 104 09/30/2017   TIBC 344 09/30/2017   FERRITIN 33 09/30/2017    Attestation Statements:   Reviewed by clinician on day of visit: allergies, medications, problem list, medical history, surgical history, family history, social history, and previous encounter notes.  I, Davy Pique, am acting as Location manager for Loyal Gambler, DO.  I have reviewed the above documentation for accuracy and completeness, and I agree with the above. Dell Ponto, DO

## 2022-05-23 ENCOUNTER — Ambulatory Visit: Payer: 59 | Admitting: Plastic Surgery

## 2022-05-23 ENCOUNTER — Encounter: Payer: Self-pay | Admitting: Plastic Surgery

## 2022-05-23 DIAGNOSIS — S21009A Unspecified open wound of unspecified breast, initial encounter: Secondary | ICD-10-CM | POA: Insufficient documentation

## 2022-05-23 DIAGNOSIS — S21001A Unspecified open wound of right breast, initial encounter: Secondary | ICD-10-CM | POA: Diagnosis not present

## 2022-05-23 NOTE — Progress Notes (Signed)
   Subjective:    Patient ID: [name], female    DOB: 04-23-1971, 51 y.o.   MRN: [id number]  HPI The patient is a 51 year old female here for evaluation of her right breast.  She underwent bilateral reduction a year ago.  She is very happy with her results.  She was concerned because a week ago she stretched and felt her skin at the inframammary fold of the right breast.  On exam it is completely healed and was at the base of the vertical limb.  Overall she looks good and is healed.   Review of Systems  Constitutional: Negative.   Eyes: Negative.   Respiratory: Negative.  Negative for chest tightness and shortness of breath.   Cardiovascular: Negative.   Gastrointestinal: Negative.   Endocrine: Negative.   Genitourinary: Negative.        Objective:   Physical Exam Constitutional:      Appearance: Normal appearance.  HENT:     Head: Normocephalic and atraumatic.  Cardiovascular:     Rate and Rhythm: Normal rate.     Pulses: Normal pulses.  Pulmonary:     Effort: Pulmonary effort is normal.  Abdominal:     Palpations: Abdomen is soft.  Skin:    Capillary Refill: Capillary refill takes less than 2 seconds.  Neurological:     Mental Status: She is alert and oriented to person, place, and time.  Psychiatric:        Mood and Affect: Mood normal.        Behavior: Behavior normal.        Thought Content: Thought content normal.         Assessment & Plan:     ICD-10-CM   1. Wound of right breast, initial encounter  S21.001A       Pictures were obtained of the patient and placed in the chart with the patient's or guardian's permission.  Continue massage as needed.  She can certainly use Mederma or skin Uva and the silicone pads for scarring.

## 2022-06-04 ENCOUNTER — Ambulatory Visit (INDEPENDENT_AMBULATORY_CARE_PROVIDER_SITE_OTHER): Payer: 59 | Admitting: Family Medicine

## 2022-06-04 ENCOUNTER — Encounter (INDEPENDENT_AMBULATORY_CARE_PROVIDER_SITE_OTHER): Payer: Self-pay | Admitting: Family Medicine

## 2022-06-04 VITALS — BP 139/83 | HR 84 | Temp 98.4°F | Ht 63.0 in | Wt 224.0 lb

## 2022-06-04 DIAGNOSIS — K219 Gastro-esophageal reflux disease without esophagitis: Secondary | ICD-10-CM | POA: Diagnosis not present

## 2022-06-04 DIAGNOSIS — E559 Vitamin D deficiency, unspecified: Secondary | ICD-10-CM | POA: Diagnosis not present

## 2022-06-04 DIAGNOSIS — Z6839 Body mass index (BMI) 39.0-39.9, adult: Secondary | ICD-10-CM

## 2022-06-04 DIAGNOSIS — R7303 Prediabetes: Secondary | ICD-10-CM | POA: Diagnosis not present

## 2022-06-04 DIAGNOSIS — Z6838 Body mass index (BMI) 38.0-38.9, adult: Secondary | ICD-10-CM

## 2022-06-04 DIAGNOSIS — E669 Obesity, unspecified: Secondary | ICD-10-CM | POA: Diagnosis not present

## 2022-06-04 MED ORDER — SEMAGLUTIDE(0.25 OR 0.5MG/DOS) 2 MG/3ML ~~LOC~~ SOPN: 0.2500 mg | PEN_INJECTOR | SUBCUTANEOUS | 0 refills | Status: DC

## 2022-06-04 MED ORDER — VITAMIN D (ERGOCALCIFEROL) 1.25 MG (50000 UNIT) PO CAPS: 50000.0000 [IU] | ORAL_CAPSULE | ORAL | 0 refills | Status: DC

## 2022-06-16 ENCOUNTER — Ambulatory Visit: Payer: 59

## 2022-06-17 NOTE — Progress Notes (Signed)
Chief Complaint:   OBESITY [name] is here to discuss her progress with her obesity treatment plan along with follow-up of her obesity related diagnoses. [name] is on the Category 3 Plan and states she is following her eating plan approximately 60% of the time. [name] states she is not exercising.   Today's visit was #: 4 Starting weight: 224 lbs Starting date: 03/31/2022 Today's weight: 224 lbs Today's date: 06/04/2022 Total lbs lost to date: 0 Total lbs lost since last in-office visit: 5 lbs  Interim History: She has reduced intake of sugar.  She has been eating on a schedule.  She is drinking a protein shake early morning, has eggs and spinach in the morning, salad and chicken for lunch, cabbage and baked chicken for dinner.  Has a beef jerky for PM snack.  Sugar cravings are reduced.  Feeling adequately full.   Subjective:   1. Pre-diabetes Last A1c 5.9.  Doing better on low sugar, lower carbohydrate diet and the restart of Ozempic 0.25 mg weekly.    2. Gastroesophageal reflux disease, unspecified whether esophagitis present She is on Protonix 40 mg daily which is helping, prescribed through Dr Halford Decamp.   3. Vitamin D deficiency She is on prescription Vitamin D 50,000 IU weekly.   Assessment/Plan:   1. Pre-diabetes Continue - Semaglutide,0.25 or 0.'5MG'$ /DOS, 2 MG/3ML SOPN; Inject 0.25 mg into the skin once a week.  Dispense: 3 mL; Refill: 0  2. Gastroesophageal reflux disease, unspecified whether esophagitis present Continue Protonix daily, GERD can be worsened by Ozempic.  3. Vitamin D deficiency Refill - Vitamin D, Ergocalciferol, (DRISDOL) 1.25 MG (50000 UNIT) CAPS capsule; Take 1 capsule (50,000 Units total) by mouth every 7 (seven) days.  Dispense: 5 capsule; Refill: 0  4. Obesity,current BMI 39.8 [name] is currently in the action stage of change. As such, her goal is to continue with weight loss efforts. She has agreed to following a lower carbohydrate,  vegetable and lean protein rich diet plan+ 1500 calories.   Exercise goals: All adults should avoid inactivity. Some physical activity is better than none, and adults who participate in any amount of physical activity gain some health benefits.  Behavioral modification strategies: increasing lean protein intake, increasing vegetables, increasing water intake, decreasing eating out, no skipping meals, meal planning and cooking strategies, keeping healthy foods in the home, and decreasing junk food.  [name] has agreed to follow-up with our clinic in 4 weeks. She was informed of the importance of frequent follow-up visits to maximize her success with intensive lifestyle modifications for her multiple health conditions.   Objective:   Blood pressure 139/83, pulse 84, temperature 98.4 F (36.9 C), height '5\' 3"'$  (1.6 m), weight 224 lb (101.6 kg), SpO2 97 %. Body mass index is 39.68 kg/m.  General: Cooperative, alert, well developed, in no acute distress. HEENT: Conjunctivae and lids unremarkable. Cardiovascular: Regular rhythm.  Lungs: Normal work of breathing. Neurologic: No focal deficits.   Lab Results  Component Value Date   CREATININE 0.59 04/01/2022   BUN 7 04/01/2022   NA 140 04/01/2022   K 4.0 04/01/2022   CL 100 04/01/2022   CO2 22 04/01/2022   Lab Results  Component Value Date   ALT 14 04/01/2022   AST 20 04/01/2022   ALKPHOS 60 04/01/2022   BILITOT 0.3 04/01/2022   Lab Results  Component Value Date   HGBA1C 5.9 (H) 04/01/2022   HGBA1C 6.0 11/25/2018   HGBA1C 5.8 02/05/2018   HGBA1C 5.8 (H)  10/07/2017   HGBA1C 6.1 10/23/2016   Lab Results  Component Value Date   INSULIN 10.5 04/01/2022   INSULIN 7.8 10/07/2017   Lab Results  Component Value Date   TSH 0.529 04/01/2022   Lab Results  Component Value Date   CHOL 170 04/01/2022   HDL 55 04/01/2022   LDLCALC 102 (H) 04/01/2022   TRIG 65 04/01/2022   CHOLHDL 3.1 04/01/2022   Lab Results  Component Value  Date   VD25OH 40.9 04/01/2022   VD25OH 34.82 11/25/2018   VD25OH 31.69 09/30/2017   Lab Results  Component Value Date   WBC 5.0 04/01/2022   HGB 13.5 04/01/2022   HCT 40.6 04/01/2022   MCV 84 04/01/2022   PLT 164 04/01/2022   Lab Results  Component Value Date   IRON 104 09/30/2017   TIBC 344 09/30/2017   FERRITIN 33 09/30/2017   Attestation Statements:   Reviewed by clinician on day of visit: allergies, medications, problem list, medical history, surgical history, family history, social history, and previous encounter notes.  I, Davy Pique, am acting as Location manager for Loyal Gambler, DO.  I have reviewed the above documentation for accuracy and completeness, and I agree with the above. Dell Ponto, DO

## 2022-06-26 ENCOUNTER — Other Ambulatory Visit: Payer: Self-pay | Admitting: Gastroenterology

## 2022-06-26 DIAGNOSIS — D128 Benign neoplasm of rectum: Secondary | ICD-10-CM

## 2022-06-26 DIAGNOSIS — Z1211 Encounter for screening for malignant neoplasm of colon: Secondary | ICD-10-CM

## 2022-06-26 DIAGNOSIS — R140 Abdominal distension (gaseous): Secondary | ICD-10-CM

## 2022-06-27 ENCOUNTER — Other Ambulatory Visit: Payer: Self-pay | Admitting: Internal Medicine

## 2022-07-02 ENCOUNTER — Ambulatory Visit (INDEPENDENT_AMBULATORY_CARE_PROVIDER_SITE_OTHER): Payer: 59 | Admitting: Family Medicine

## 2022-07-02 ENCOUNTER — Encounter (INDEPENDENT_AMBULATORY_CARE_PROVIDER_SITE_OTHER): Payer: Self-pay | Admitting: Family Medicine

## 2022-07-02 VITALS — BP 128/83 | HR 78 | Temp 98.7°F | Ht 63.0 in | Wt 222.0 lb

## 2022-07-02 DIAGNOSIS — Z6839 Body mass index (BMI) 39.0-39.9, adult: Secondary | ICD-10-CM

## 2022-07-02 DIAGNOSIS — K219 Gastro-esophageal reflux disease without esophagitis: Secondary | ICD-10-CM

## 2022-07-02 DIAGNOSIS — E669 Obesity, unspecified: Secondary | ICD-10-CM

## 2022-07-02 DIAGNOSIS — E559 Vitamin D deficiency, unspecified: Secondary | ICD-10-CM | POA: Diagnosis not present

## 2022-07-02 DIAGNOSIS — R7303 Prediabetes: Secondary | ICD-10-CM | POA: Diagnosis not present

## 2022-07-02 MED ORDER — SEMAGLUTIDE(0.25 OR 0.5MG/DOS) 2 MG/3ML ~~LOC~~ SOPN: 0.5000 mg | PEN_INJECTOR | SUBCUTANEOUS | 0 refills | Status: DC

## 2022-07-02 MED ORDER — VITAMIN D (ERGOCALCIFEROL) 1.25 MG (50000 UNIT) PO CAPS: 50000.0000 [IU] | ORAL_CAPSULE | ORAL | 0 refills | Status: DC

## 2022-07-14 NOTE — Progress Notes (Signed)
Chief Complaint:   OBESITY [name] is here to discuss her progress with her obesity treatment plan along with follow-up of her obesity related diagnoses. [name] is on following a lower carbohydrate, vegetable and lean protein rich diet plan and states she is following her eating plan approximately 90% of the time. Kalijah states she is exercising.  Today's visit was #: 5 Starting weight: 224 LBS Starting date: 03/31/2022 Today's weight: 222 LBS Today's date: 07/02/2022 Total lbs lost to date: 2 LBS Total lbs lost since last in-office visit: 2 LBS  Interim History: Patient got a puppy last week.  She has not added in regular exercise.  She has cut out sweets for 1 month even with Thanksgiving.  Denies cravings.  She has poor sleep at night.  Her stress levels are high at work.  Subjective:   1. Pre-diabetes Her last A1c was 5.9.  She restarted Ozempic 0.25 mg weekly, she denies GI upset, or constipation.  2. Gastroesophageal reflux disease, unspecified whether esophagitis present Managed by Dr. Silverio Decamp.  She is taking Protonix 40 mg daily, it is controlling her symptoms.  3. Vitamin D deficiency She is currently taking prescription vitamin D 50,000 IU each week. She denies nausea, vomiting or muscle weakness.  Last vitamin D level 40.9.   Assessment/Plan:   1. Pre-diabetes Increase Ozempic to 0.5 mg weekly.  Increase- Semaglutide,0.25 or 0.'5MG'$ /DOS, 2 MG/3ML SOPN; Inject 0.5 mg into the skin once a week.  Dispense: 3 mL; Refill: 0  2. Gastroesophageal reflux disease, unspecified whether esophagitis present Continue Protonix.  Avoid late night eating and large meals.  3. Vitamin D deficiency Refill- Vitamin D, Ergocalciferol, (DRISDOL) 1.25 MG (50000 UNIT) CAPS capsule; Take 1 capsule (50,000 Units total) by mouth every 7 (seven) days.  Dispense: 5 capsule; Refill: 0  4. Obesity, current BMI 39.5 1.  Discussed importance of adding in exercise. 2.  Log daily caloric  intake.  [name] is currently in the action stage of change. As such, her goal is to continue with weight loss efforts. She has agreed to keeping a food journal and adhering to recommended goals of 1300 calories and 90-100 protein daily.  Exercise goals: All adults should avoid inactivity. Some physical activity is better than none, and adults who participate in any amount of physical activity gain some health benefits.  Behavioral modification strategies: increasing lean protein intake, increasing vegetables, increasing water intake, decreasing eating out, meal planning and cooking strategies, keeping healthy foods in the home, better snacking choices, and planning for success.  [name] has agreed to follow-up with our clinic in 4 weeks. She was informed of the importance of frequent follow-up visits to maximize her success with intensive lifestyle modifications for her multiple health conditions.   Objective:   Blood pressure 128/83, pulse 78, temperature 98.7 F (37.1 C), height '5\' 3"'$  (1.6 m), weight 222 lb (100.7 kg), SpO2 99 %. Body mass index is 39.33 kg/m.  General: Cooperative, alert, well developed, in no acute distress. HEENT: Conjunctivae and lids unremarkable. Cardiovascular: Regular rhythm.  Lungs: Normal work of breathing. Neurologic: No focal deficits.   Lab Results  Component Value Date   CREATININE 0.59 04/01/2022   BUN 7 04/01/2022   NA 140 04/01/2022   K 4.0 04/01/2022   CL 100 04/01/2022   CO2 22 04/01/2022   Lab Results  Component Value Date   ALT 14 04/01/2022   AST 20 04/01/2022   ALKPHOS 60 04/01/2022   BILITOT 0.3 04/01/2022  Lab Results  Component Value Date   HGBA1C 5.9 (H) 04/01/2022   HGBA1C 6.0 11/25/2018   HGBA1C 5.8 02/05/2018   HGBA1C 5.8 (H) 10/07/2017   HGBA1C 6.1 10/23/2016   Lab Results  Component Value Date   INSULIN 10.5 04/01/2022   INSULIN 7.8 10/07/2017   Lab Results  Component Value Date   TSH 0.529 04/01/2022   Lab  Results  Component Value Date   CHOL 170 04/01/2022   HDL 55 04/01/2022   LDLCALC 102 (H) 04/01/2022   TRIG 65 04/01/2022   CHOLHDL 3.1 04/01/2022   Lab Results  Component Value Date   VD25OH 40.9 04/01/2022   VD25OH 34.82 11/25/2018   VD25OH 31.69 09/30/2017   Lab Results  Component Value Date   WBC 5.0 04/01/2022   HGB 13.5 04/01/2022   HCT 40.6 04/01/2022   MCV 84 04/01/2022   PLT 164 04/01/2022   Lab Results  Component Value Date   IRON 104 09/30/2017   TIBC 344 09/30/2017   FERRITIN 33 09/30/2017   Attestation Statements:   Reviewed by clinician on day of visit: allergies, medications, problem list, medical history, surgical history, family history, social history, and previous encounter notes.  I, Davy Pique, am acting as Location manager for Loyal Gambler, DO.  I have reviewed the above documentation for accuracy and completeness, and I agree with the above. Dell Ponto, DO

## 2022-07-23 ENCOUNTER — Encounter: Payer: Self-pay | Admitting: Internal Medicine

## 2022-07-23 ENCOUNTER — Ambulatory Visit: Payer: 59 | Admitting: Internal Medicine

## 2022-07-23 VITALS — BP 122/80 | HR 82 | Temp 98.0°F | Ht 63.0 in | Wt 224.0 lb

## 2022-07-23 DIAGNOSIS — Z23 Encounter for immunization: Secondary | ICD-10-CM

## 2022-07-23 DIAGNOSIS — F5101 Primary insomnia: Secondary | ICD-10-CM | POA: Diagnosis not present

## 2022-07-23 DIAGNOSIS — M545 Low back pain, unspecified: Secondary | ICD-10-CM | POA: Diagnosis not present

## 2022-07-23 DIAGNOSIS — L732 Hidradenitis suppurativa: Secondary | ICD-10-CM

## 2022-07-23 DIAGNOSIS — G8929 Other chronic pain: Secondary | ICD-10-CM | POA: Diagnosis not present

## 2022-07-23 DIAGNOSIS — G4700 Insomnia, unspecified: Secondary | ICD-10-CM | POA: Insufficient documentation

## 2022-07-23 DIAGNOSIS — N926 Irregular menstruation, unspecified: Secondary | ICD-10-CM | POA: Insufficient documentation

## 2022-07-23 MED ORDER — DOXYCYCLINE HYCLATE 100 MG PO TABS: 100.0000 mg | ORAL_TABLET | Freq: Two times a day (BID) | ORAL | 3 refills | Status: DC

## 2022-07-23 MED ORDER — CYCLOBENZAPRINE HCL 10 MG PO TABS: 10.0000 mg | ORAL_TABLET | Freq: Every day | ORAL | 3 refills | Status: DC

## 2022-07-23 MED ORDER — HYDROXYZINE PAMOATE 25 MG PO CAPS: 25.0000 mg | ORAL_CAPSULE | Freq: Every day | ORAL | 1 refills | Status: DC

## 2022-07-23 MED ORDER — RIZATRIPTAN BENZOATE 10 MG PO TABS: ORAL_TABLET | ORAL | 5 refills | Status: DC

## 2022-07-23 MED ORDER — ALPRAZOLAM 1 MG PO TABS: 1.0000 mg | ORAL_TABLET | Freq: Two times a day (BID) | ORAL | 1 refills | Status: DC | PRN

## 2022-07-23 NOTE — Patient Instructions (Signed)
  Start Doxy  Hibiclens scrub Use Boilease cream Use Epsom salt compress

## 2022-07-23 NOTE — Assessment & Plan Note (Signed)
Worse Cont w/ flexeril, Xanax at night Added Hydroxyzine at hs

## 2022-07-23 NOTE — Addendum Note (Signed)
Addended by: Basil Dess on: 07/23/2022 11:07 AM   Modules accepted: Orders

## 2022-07-23 NOTE — Assessment & Plan Note (Signed)
New - relapsed  Start Doxy  Hibiclens scrub Use Boilease cream Use Epsom salt compress

## 2022-07-23 NOTE — Progress Notes (Signed)
Subjective:  Patient ID: [name], female    DOB: Aug 15, 1970  Age: 51 y.o. MRN: [id number]  CC: Follow-up (Has a knot under right arm)   HPI Deniqua Perry presents for insomnia - worse  Outpatient Medications Prior to Visit  Medication Sig Dispense Refill   Ascorbic Acid (VITAMIN C PO) Take by mouth.     chlorhexidine (HIBICLENS) 4 % external liquid Apply topically daily as needed. Use 3/week 1000 mL 3   Cholecalciferol 25 MCG (1000 UT) tablet Take 2,000 Units by mouth daily.     Doxylamine-Phenylephrine-APAP (VICKS SINEX NIGHTTIME PO) Take by mouth.     fluticasone (FLONASE) 50 MCG/ACT nasal spray SPRAY 2 SPRAYS INTO EACH NOSTRIL EVERY DAY 48 mL 0   hyoscyamine (LEVSIN SL) 0.125 MG SL tablet Place 1 tablet (0.125 mg total) under the tongue every 4 (four) hours as needed. 45 tablet 1   linaclotide (LINZESS) 290 MCG CAPS capsule Take 1 capsule (290 mcg total) by mouth daily before breakfast. 30 capsule 3   montelukast (SINGULAIR) 10 MG tablet TAKE 1 TABLET BY MOUTH EVERYDAY AT BEDTIME 90 tablet 1   mupirocin cream (BACTROBAN) 2 % Apply 1 application topically 2 (two) times daily. 30 g 3   naproxen (NAPROSYN) 500 MG tablet TAKE 1 TABLET BY MOUTH TWICE A DAY AS NEEDED FOR MODERATE PAIN OR HEADACHE 60 tablet 3   norethindrone-ethinyl estradiol (FYAVOLV) 1-5 MG-MCG TABS tablet Take by mouth daily.     Olmesartan-amLODIPine-HCTZ 40-5-12.5 MG TABS TAKE 1 TABLET BY MOUTH EVERY DAY 90 tablet 3   pantoprazole (PROTONIX) 40 MG tablet TAKE 1 TABLET BY MOUTH EVERY DAY 90 tablet 1   Potassium Chloride ER 20 MEQ TBCR TAKE 1 TABLET BY MOUTH EVERY DAY 90 tablet 1   Prenatal Vit-Fe Fumarate-FA (M-NATAL PLUS) 27-1 MG TABS TAKE 1 TABLET BY MOUTH EVERY DAY 90 tablet 3   Semaglutide,0.25 or 0.'5MG'$ /DOS, 2 MG/3ML SOPN Inject 0.5 mg into the skin once a week. 3 mL 0   triamcinolone (KENALOG) 0.1 % paste Use as directed 1 application. in the mouth or throat 3 (three) times daily. On canker sores 5 g 1    valACYclovir (VALTREX) 1000 MG tablet Take 1 tablet (1,000 mg total) by mouth 3 (three) times daily. 21 tablet 1   Vitamin D, Ergocalciferol, (DRISDOL) 1.25 MG (50000 UNIT) CAPS capsule Take 1 capsule (50,000 Units total) by mouth every 7 (seven) days. 5 capsule 0   ALPRAZolam (XANAX) 0.5 MG tablet TAKE 1 TO 2 TABLETS BY MOUTH EVERY DAY AT BEDTIME PRN 60 tablet 3   cyclobenzaprine (FLEXERIL) 5 MG tablet TAKE 1 TABLET BY MOUTH THREE TIMES A DAY AS NEEDED FOR MUSCLE SPASMS 60 tablet 3   rizatriptan (MAXALT) 10 MG tablet TAKE 1 TABLET BY MOUTH ONCE AS NEEDED FOR UP TO 1 DOSE FOR MIGRAINE. MAY REPEAT IN 2 HOURS 12 tablet 5   No facility-administered medications prior to visit.    ROS: Review of Systems  Constitutional:  Negative for activity change, appetite change, chills, fatigue and unexpected weight change.  HENT:  Negative for congestion, mouth sores and sinus pressure.   Eyes:  Negative for visual disturbance.  Respiratory:  Negative for cough and chest tightness.   Gastrointestinal:  Negative for abdominal pain and nausea.  Genitourinary:  Negative for difficulty urinating, frequency and vaginal pain.  Musculoskeletal:  Negative for back pain and gait problem.  Skin:  Positive for color change and rash. Negative for pallor.  Neurological:  Negative  for dizziness, tremors, weakness, numbness and headaches.  Psychiatric/Behavioral:  Positive for sleep disturbance. Negative for confusion and suicidal ideas. The patient is not nervous/anxious.     Objective:  BP 122/80 (BP Location: Left Arm, Patient Position: Sitting, Cuff Size: Normal)   Pulse 82   Temp 98 F (36.7 C) (Oral)   Ht '5\' 3"'$  (1.6 m)   Wt 224 lb (101.6 kg)   SpO2 97%   BMI 39.68 kg/m   BP Readings from Last 3 Encounters:  07/23/22 122/80  07/02/22 128/83  06/04/22 139/83    Wt Readings from Last 3 Encounters:  07/23/22 224 lb (101.6 kg)  07/02/22 222 lb (100.7 kg)  06/04/22 224 lb (101.6 kg)    Physical  Exam Constitutional:      General: She is not in acute distress.    Appearance: She is well-developed.  HENT:     Head: Normocephalic.     Right Ear: External ear normal.     Left Ear: External ear normal.     Nose: Nose normal.  Eyes:     General:        Right eye: No discharge.        Left eye: No discharge.     Conjunctiva/sclera: Conjunctivae normal.     Pupils: Pupils are equal, round, and reactive to light.  Neck:     Thyroid: No thyromegaly.     Vascular: No JVD.     Trachea: No tracheal deviation.  Cardiovascular:     Rate and Rhythm: Normal rate and regular rhythm.     Heart sounds: Normal heart sounds.  Pulmonary:     Effort: No respiratory distress.     Breath sounds: No stridor. No wheezing.  Abdominal:     General: Bowel sounds are normal. There is no distension.     Palpations: Abdomen is soft. There is no mass.     Tenderness: There is no abdominal tenderness. There is no guarding or rebound.  Musculoskeletal:        General: No tenderness.     Cervical back: Normal range of motion and neck supple. No rigidity.  Lymphadenopathy:     Cervical: No cervical adenopathy.  Skin:    Findings: Erythema and lesion present. No rash.  Neurological:     Cranial Nerves: No cranial nerve deficit.     Motor: No abnormal muscle tone.     Coordination: Coordination normal.     Deep Tendon Reflexes: Reflexes normal.  Psychiatric:        Behavior: Behavior normal.        Thought Content: Thought content normal.        Judgment: Judgment normal.    1 cm R axilla boil   Lab Results  Component Value Date   WBC 5.0 04/01/2022   HGB 13.5 04/01/2022   HCT 40.6 04/01/2022   PLT 164 04/01/2022   GLUCOSE 86 04/01/2022   CHOL 170 04/01/2022   TRIG 65 04/01/2022   HDL 55 04/01/2022   LDLCALC 102 (H) 04/01/2022   ALT 14 04/01/2022   AST 20 04/01/2022   NA 140 04/01/2022   K 4.0 04/01/2022   CL 100 04/01/2022   CREATININE 0.59 04/01/2022   BUN 7 04/01/2022   CO2 22  04/01/2022   TSH 0.529 04/01/2022   INR 0.9 RATIO 03/15/2007   HGBA1C 5.9 (H) 04/01/2022    CT Abdomen Pelvis W Contrast  Result Date: 01/13/2022 CLINICAL DATA:  Severe abdominal bloating, upper abdominal  mass. Bowel obstruction. EXAM: CT ABDOMEN AND PELVIS WITH CONTRAST TECHNIQUE: Multidetector CT imaging of the abdomen and pelvis was performed using the standard protocol following bolus administration of intravenous contrast. RADIATION DOSE REDUCTION: This exam was performed according to the departmental dose-optimization program which includes automated exposure control, adjustment of the mA and/or kV according to patient size and/or use of iterative reconstruction technique. CONTRAST:  184m OMNIPAQUE IOHEXOL 300 MG/ML  SOLN COMPARISON:  None Available. FINDINGS: Lower chest: No acute abnormality. Hepatobiliary: No focal liver abnormality is seen. No gallstones, gallbladder wall thickening, or biliary dilatation. Pancreas: Unremarkable Spleen: Unremarkable Adrenals/Urinary Tract: Stable 2.2 cm right adrenal adenoma. Stable 13 mm more posterior right adrenal nodule, previously characterized as a lipid poor adenoma. Left adrenal gland is unremarkable. The kidneys are normal in size and position. Simple cortical cyst noted within the lower pole the left kidney. No follow-up imaging is recommended for this lesion. The kidneys are otherwise unremarkable. Bladder unremarkable. Stomach/Bowel: Mild sigmoid diverticulosis. A small umbilical hernia contains a single unremarkable loop of small bowel. The stomach, small bowel, and large bowel are otherwise unremarkable. No evidence of obstruction or focal inflammation. No free intraperitoneal gas or fluid. Appendix normal. Vascular/Lymphatic: Circumaortic left renal vein. The abdominal vasculature is otherwise unremarkable. No pathologic adenopathy within the abdomen and pelvis. Reproductive: The uterus is lobulated and demonstrates heterogeneous enhancement with  scattered coarse calcifications in keeping with multiple underlying uterine fibroids. The pelvic organs are otherwise unremarkable. Other: None Musculoskeletal: Degenerative changes seen within the lumbar spine. No acute bone abnormality. No lytic or blastic bone lesion. IMPRESSION: No acute intra-abdominal pathology identified. No bowel obstruction. No definite radiographic explanation for the patient's reported symptoms. Stable 2.2 cm benign right adrenal adenoma. Stable 1.3 cm more posterior right adrenal nodule, previously characterized as a lipid poor adenoma. Mild sigmoid diverticulosis without superimposed acute inflammatory change. Fibroid uterus. Small umbilical hernia containing and unremarkable loop of small bowel, similar to prior examination. Electronically Signed   By: AFidela SalisburyM.D.   On: 01/13/2022 02:02    Assessment & Plan:   Problem List Items Addressed This Visit     Low back pain    Worse Cont w/ flexeril, Xanax at night Added Hydroxyzine at hs      Relevant Medications   cyclobenzaprine (FLEXERIL) 10 MG tablet   Insomnia disorder - Primary    Worse Cont w/ flexeril, Xanax at night Added Hydroxyzine at hs      Hydradenitis    New - relapsed  Start Doxy  Hibiclens scrub Use Boilease cream Use Epsom salt compress         Meds ordered this encounter  Medications   ALPRAZolam (XANAX) 1 MG tablet    Sig: Take 1 tablet (1 mg total) by mouth 3 times/day as needed-between meals & bedtime for anxiety.    Dispense:  90 tablet    Refill:  1   hydrOXYzine (VISTARIL) 25 MG capsule    Sig: Take 1 capsule (25 mg total) by mouth at bedtime.    Dispense:  90 capsule    Refill:  1   cyclobenzaprine (FLEXERIL) 10 MG tablet    Sig: Take 1 tablet (10 mg total) by mouth at bedtime.    Dispense:  90 tablet    Refill:  3   doxycycline (VIBRA-TABS) 100 MG tablet    Sig: Take 1 tablet (100 mg total) by mouth 2 (two) times daily.    Dispense:  20 tablet  Refill:  3    rizatriptan (MAXALT) 10 MG tablet    Sig: TAKE 1 TABLET BY MOUTH ONCE AS NEEDED FOR UP TO 1 DOSE FOR MIGRAINE. MAY REPEAT IN 2 HOURS    Dispense:  12 tablet    Refill:  5      Follow-up: Return in about 4 months (around 11/22/2022) for a follow-up visit.  Walker Kehr, MD

## 2022-07-24 ENCOUNTER — Encounter (INDEPENDENT_AMBULATORY_CARE_PROVIDER_SITE_OTHER): Payer: Self-pay | Admitting: Family Medicine

## 2022-07-24 ENCOUNTER — Ambulatory Visit (INDEPENDENT_AMBULATORY_CARE_PROVIDER_SITE_OTHER): Payer: 59 | Admitting: Family Medicine

## 2022-07-24 VITALS — BP 116/83 | HR 88 | Temp 98.1°F | Ht 63.0 in | Wt 220.0 lb

## 2022-07-24 DIAGNOSIS — Z72820 Sleep deprivation: Secondary | ICD-10-CM | POA: Diagnosis not present

## 2022-07-24 DIAGNOSIS — R7303 Prediabetes: Secondary | ICD-10-CM | POA: Diagnosis not present

## 2022-07-24 DIAGNOSIS — E669 Obesity, unspecified: Secondary | ICD-10-CM | POA: Diagnosis not present

## 2022-07-24 DIAGNOSIS — E559 Vitamin D deficiency, unspecified: Secondary | ICD-10-CM | POA: Diagnosis not present

## 2022-07-24 DIAGNOSIS — Z6839 Body mass index (BMI) 39.0-39.9, adult: Secondary | ICD-10-CM

## 2022-07-24 MED ORDER — SEMAGLUTIDE(0.25 OR 0.5MG/DOS) 2 MG/3ML ~~LOC~~ SOPN: 0.5000 mg | PEN_INJECTOR | SUBCUTANEOUS | 0 refills | Status: DC

## 2022-07-24 MED ORDER — VITAMIN D (ERGOCALCIFEROL) 1.25 MG (50000 UNIT) PO CAPS: 50000.0000 [IU] | ORAL_CAPSULE | ORAL | 0 refills | Status: DC

## 2022-07-31 NOTE — Progress Notes (Signed)
Chief Complaint:   OBESITY [name] is here to discuss her progress with her obesity treatment plan along with follow-up of her obesity related diagnoses. [name] is on keeping a food journal and adhering to recommended goals of 1300 calories and 90 protein and states she is following her eating plan approximately  80% of the time. [name] states she is not exercising.  Today's visit was #: 6 Starting weight: 224 lbs Starting date: 03/31/2022 Today's weight: 220 lbs Today's date: 07/24/2022 Total lbs lost to date: 4 lbs Total lbs lost since last in-office visit: 2 lbs  Interim History: Has bloating with most carbs.  Breakfast is Spinach and egg beaters, cheese, coffee and SF creamer.  Has a premier protein shake mid AM, a salad with chicken from work cafeteria for lunch and   Dinner was [place], cobb salad and ranch.  Not cooking dinner at home. Poor sleep has caused fatigue and poor motivation.   Subjective:   1. Poor sleep Multiple awakenings through the night.  Started hydroxyzine per PCP.  But so far, it has not helped.   2. Pre-diabetes A1c, 5.9 on 04/01/2022.  Sticking to a low sugar, low carb diet.  Doing well on Ozempic 0.5 weekly without GI side effects.    3. Vitamin D deficiency Vitamin D level 40.9, 04/01/2022.  Patient is on prescription Vitamin D 50,000 IU weekly.  Energy levels low due to lack of sleep.   Assessment/Plan:   1. Poor sleep Continue current medications, working on stress reduction and sleep hygiene.    2. Pre-diabetes Low carb handout given. Check A1c at next visit.   Refill - Semaglutide,0.25 or 0.'5MG'$ /DOS, 2 MG/3ML SOPN; Inject 0.5 mg into the skin once a week.  Dispense: 3 mL; Refill: 0  3. Vitamin D deficiency Recheck Vitamin D level next visit.   Refill - Vitamin D, Ergocalciferol, (DRISDOL) 1.25 MG (50000 UNIT) CAPS capsule; Take 1 capsule (50,000 Units total) by mouth every 7 (seven) days.  Dispense: 5 capsule; Refill: 0  4.  Obesity,current BMI 39.0  Minimize meals out.  Track daily steps  Reviewed increase of 2.2 lb muscle, decrease 5 lb of body fat since last visit.  Eating out guide given. Reviewed calorie content of salad dressings.  [name] is currently in the action stage of change. As such, her goal is to continue with weight loss efforts. She has agreed to keeping a food journal and adhering to recommended goals of 1300 calories and 90 protein daily.  Exercise goals:  As is.   Behavioral modification strategies: increasing lean protein intake, increasing vegetables, increasing water intake, decreasing eating out, meal planning and cooking strategies, keeping healthy foods in the home, holiday eating strategies , and planning for success.  [name] has agreed to follow-up with our clinic in 4 weeks. She was informed of the importance of frequent follow-up visits to maximize her success with intensive lifestyle modifications for her multiple health conditions.   Objective:   Blood pressure 116/83, pulse 88, temperature 98.1 F (36.7 C), height '5\' 3"'$  (1.6 m), weight 220 lb (99.8 kg), SpO2 98 %. Body mass index is 38.97 kg/m.  General: Cooperative, alert, well developed, in no acute distress. HEENT: Conjunctivae and lids unremarkable. Cardiovascular: Regular rhythm.  Lungs: Normal work of breathing. Neurologic: No focal deficits.   Lab Results  Component Value Date   CREATININE 0.59 04/01/2022   BUN 7 04/01/2022   NA 140 04/01/2022   K 4.0 04/01/2022  CL 100 04/01/2022   CO2 22 04/01/2022   Lab Results  Component Value Date   ALT 14 04/01/2022   AST 20 04/01/2022   ALKPHOS 60 04/01/2022   BILITOT 0.3 04/01/2022   Lab Results  Component Value Date   HGBA1C 5.9 (H) 04/01/2022   HGBA1C 6.0 11/25/2018   HGBA1C 5.8 02/05/2018   HGBA1C 5.8 (H) 10/07/2017   HGBA1C 6.1 10/23/2016   Lab Results  Component Value Date   INSULIN 10.5 04/01/2022   INSULIN 7.8 10/07/2017   Lab Results   Component Value Date   TSH 0.529 04/01/2022   Lab Results  Component Value Date   CHOL 170 04/01/2022   HDL 55 04/01/2022   LDLCALC 102 (H) 04/01/2022   TRIG 65 04/01/2022   CHOLHDL 3.1 04/01/2022   Lab Results  Component Value Date   VD25OH 40.9 04/01/2022   VD25OH 34.82 11/25/2018   VD25OH 31.69 09/30/2017   Lab Results  Component Value Date   WBC 5.0 04/01/2022   HGB 13.5 04/01/2022   HCT 40.6 04/01/2022   MCV 84 04/01/2022   PLT 164 04/01/2022   Lab Results  Component Value Date   IRON 104 09/30/2017   TIBC 344 09/30/2017   FERRITIN 33 09/30/2017   Attestation Statements:   Reviewed by clinician on day of visit: allergies, medications, problem list, medical history, surgical history, family history, social history, and previous encounter notes.  I, Davy Pique, am acting as Location manager for Loyal Gambler, DO.  I have reviewed the above documentation for accuracy and completeness, and I agree with the above. Dell Ponto, DO

## 2022-08-10 ENCOUNTER — Other Ambulatory Visit: Payer: Self-pay | Admitting: Gastroenterology

## 2022-08-10 DIAGNOSIS — Z1211 Encounter for screening for malignant neoplasm of colon: Secondary | ICD-10-CM

## 2022-08-10 DIAGNOSIS — D128 Benign neoplasm of rectum: Secondary | ICD-10-CM

## 2022-08-10 DIAGNOSIS — R140 Abdominal distension (gaseous): Secondary | ICD-10-CM

## 2022-08-21 ENCOUNTER — Ambulatory Visit (INDEPENDENT_AMBULATORY_CARE_PROVIDER_SITE_OTHER): Payer: 59 | Admitting: Family Medicine

## 2022-09-22 ENCOUNTER — Other Ambulatory Visit: Payer: Self-pay | Admitting: Internal Medicine

## 2022-09-22 ENCOUNTER — Encounter (INDEPENDENT_AMBULATORY_CARE_PROVIDER_SITE_OTHER): Payer: Self-pay | Admitting: Family Medicine

## 2022-09-22 ENCOUNTER — Ambulatory Visit (INDEPENDENT_AMBULATORY_CARE_PROVIDER_SITE_OTHER): Payer: 59 | Admitting: Family Medicine

## 2022-09-22 VITALS — BP 127/86 | HR 86 | Temp 98.5°F | Ht 63.0 in | Wt 222.0 lb

## 2022-09-22 DIAGNOSIS — E559 Vitamin D deficiency, unspecified: Secondary | ICD-10-CM | POA: Diagnosis not present

## 2022-09-22 DIAGNOSIS — R457 State of emotional shock and stress, unspecified: Secondary | ICD-10-CM

## 2022-09-22 DIAGNOSIS — Z6839 Body mass index (BMI) 39.0-39.9, adult: Secondary | ICD-10-CM

## 2022-09-22 DIAGNOSIS — R7303 Prediabetes: Secondary | ICD-10-CM

## 2022-09-22 MED ORDER — VITAMIN D (ERGOCALCIFEROL) 1.25 MG (50000 UNIT) PO CAPS: 50000.0000 [IU] | ORAL_CAPSULE | ORAL | 0 refills | Status: DC

## 2022-09-22 MED ORDER — SEMAGLUTIDE (1 MG/DOSE) 4 MG/3ML ~~LOC~~ SOPN: 1.0000 mg | PEN_INJECTOR | SUBCUTANEOUS | 0 refills | Status: DC

## 2022-09-22 NOTE — Assessment & Plan Note (Signed)
Lab Results  Component Value Date   HGBA1C 5.9 (H) 04/01/2022    Continue prescribed meal plan with 1300 kcal/day which should include about 100 g of protein daily.  Continue to limit intake of added sugar and refined carbohydrates or working on lean protein and fiber with meals.  She has been tolerating Ozempic 0.5 mg once weekly injection without abdominal pain or constipation this time around.  Will increase her Ozempic to 1 mg once weekly injection.  Watch for constipation and nausea.

## 2022-09-22 NOTE — Addendum Note (Signed)
Addended by: Dell Ponto on: 09/22/2022 02:28 PM   Modules accepted: Orders

## 2022-09-22 NOTE — Assessment & Plan Note (Addendum)
Plans to talk to her boss about getting help at work Will set up a visit with Dr Mallie Mussel for CBT Will work on stress reduction and mindful eating Will be working on increasing exercise to help manage stress better Her mom is her support system.

## 2022-09-22 NOTE — Progress Notes (Signed)
Office: [phone]  /  Fax: [facility] Height: 5' 3"$  (1.6 m) Weight: 222 lb (100.7 kg) Temp: 98.5 F (36.9 C) Pulse Rate: 86 BP: 127/86 SpO2: 98 % Fasting: yes Labs: yes Today's Visit #: 7 Weight at Last VIsit: 220lb Weight Lost Since Last Visit: +2  Body Fat %: 42.8 % Fat Mass (lbs): 95.2 lbs Muscle Mass (lbs): 121 lbs Total Body Water (lbs): 82.6 lbs Visceral Fat Rating : 13 Starting Date: 03/31/22 Starting Weight: 224lb Total Weight Loss (lbs): 2 lb (0.907 kg)    HPI  Chief Complaint: OBESITY  [name] is here to discuss her progress with her obesity treatment plan. She is on the keeping a food journal and adhering to recommended goals of 1300 calories and 90 protein and states she is following her eating plan approximately 50 % of the time. She states she is not exercising.    Interval History:  Since last office visit she up 2 lb.  She is still using Ozempic 0.5 [place] weekly.  No longer having abdominal bloating.  Admits to emotional eating with sweets due to high stress levels at work.  Denies meal skipping.  Getting in more lean protein with meals and snacks.  She is sleeping better.  She does plan to increase her exercise time.  She does have a dog that she plans on walking more in the spring.  Pharmacotherapy: Ozempic  PHYSICAL EXAM:  Blood pressure 127/86, pulse 86, temperature 98.5 F (36.9 C), height 5' 3"$  (1.6 m), weight 222 lb (100.7 kg), SpO2 98 %. Body mass index is 39.33 kg/m.  General: She is overweight, cooperative, alert, well developed, and in no acute distress. PSYCH: Has normal mood, affect and thought process.   HEENT: EOMI, sclerae are anicteric. Lungs: Normal breathing effort, no conversational dyspnea. Extremities: No edema.  Neurologic: No gross sensory or motor deficits. No tremors or fasciculations noted.    DIAGNOSTIC DATA REVIEWED:  BMET    Component Value Date/Time   NA  140 04/01/2022 1025   K 4.0 04/01/2022 1025   CL 100 04/01/2022 1025   CO2 22 04/01/2022 1025   GLUCOSE 86 04/01/2022 1025   GLUCOSE 87 12/24/2021 1021   BUN 7 04/01/2022 1025   CREATININE 0.59 04/01/2022 1025   CALCIUM 9.3 04/01/2022 1025   GFRNONAA 111 10/07/2017 1011   GFRAA 128 10/07/2017 1011   Lab Results  Component Value Date   HGBA1C 5.9 (H) 04/01/2022   HGBA1C 5.2 03/29/2007   Lab Results  Component Value Date   INSULIN 10.5 04/01/2022   INSULIN 7.8 10/07/2017   Lab Results  Component Value Date   TSH 0.529 04/01/2022   CBC    Component Value Date/Time   WBC 5.0 04/01/2022 1025   WBC 4.7 12/24/2021 1021   RBC 4.86 04/01/2022 1025   RBC 5.00 12/24/2021 1021   HGB 13.5 04/01/2022 1025   HCT 40.6 04/01/2022 1025   PLT 164 04/01/2022 1025   MCV 84 04/01/2022 1025   MCH 27.8 04/01/2022 1025   MCH 26.3 08/24/2017 0922   MCHC 33.3 04/01/2022 1025   MCHC 33.3 12/24/2021 1021   RDW 13.6 04/01/2022 1025   Iron Studies    Component Value Date/Time   IRON 104 09/30/2017 0938   TIBC 344 09/30/2017 0938   FERRITIN 33 09/30/2017 0938   IRONPCTSAT 30 09/30/2017 0938   Lipid Panel     Component Value Date/Time   CHOL  170 04/01/2022 1025   TRIG 65 04/01/2022 1025   HDL 55 04/01/2022 1025   CHOLHDL 3.1 04/01/2022 1025   CHOLHDL 3 07/16/2021 0945   VLDL 16.0 07/16/2021 0945   LDLCALC 102 (H) 04/01/2022 1025   Hepatic Function Panel     Component Value Date/Time   PROT 6.9 04/01/2022 1025   ALBUMIN 4.4 04/01/2022 1025   AST 20 04/01/2022 1025   ALT 14 04/01/2022 1025   ALKPHOS 60 04/01/2022 1025   BILITOT 0.3 04/01/2022 1025   BILIDIR 0.1 11/25/2018 1546   IBILI 0.1 (L) 10/21/2012 2000      Component Value Date/Time   TSH 0.529 04/01/2022 1025   Nutritional Lab Results  Component Value Date   VD25OH 40.9 04/01/2022   VD25OH 34.82 11/25/2018   VD25OH 31.69 09/30/2017     ASSESSMENT AND PLAN  TREATMENT PLAN FOR OBESITY:  Recommended Dietary  Goals  Passion is currently in the action stage of change. As such, her goal is to continue weight management plan. She has agreed to keeping a food journal and adhering to recommended goals of 1300 calories and 100 protein.  Behavioral Intervention  We discussed the following Behavioral Modification Strategies today: increasing lean protein intake, increasing vegetables, increasing lower sugar fruits, increase water intake, work on meal planning and easy cooking plans, think about ways to increase physical activity, emotional eating strategies, and avoiding temptations.  Additional resources provided today: NA  Recommended Physical Activity Goals  Miguel has been advised to work up to 150 minutes of moderate intensity aerobic activity a week and strengthening exercises 2-3 times per week for cardiovascular health, weight loss maintenance and preservation of muscle mass.   She has agreed to Will begin regular aerobic exercise 30 minutes, 3 times per week. Chosen activity walking.   Pharmacotherapy We discussed various medication options to help Mitzi with her weight loss efforts and we both agreed to [employer].  ASSOCIATED CONDITIONS ADDRESSED TODAY  Prediabetes Assessment & Plan: Lab Results  Component Value Date   HGBA1C 5.9 (H) 04/01/2022    Continue prescribed meal plan with 1300 kcal/day which should include about 100 g of protein daily.  Continue to limit intake of added sugar and refined carbohydrates or working on lean protein and fiber with meals.  She has been tolerating Ozempic 0.5 mg once weekly injection without abdominal pain or constipation this time around.  Will increase her Ozempic to 1 mg once weekly injection.  Watch for constipation and nausea.  Orders: -     Semaglutide (1 MG/DOSE); Inject 1 mg as directed once a week.  Dispense: 3 mL; Refill: 0  Morbid obesity (HCC)  BMI 39.0-39.9,adult  Emotional stress Assessment & Plan: Plans to talk to her boss  about getting help at work Will set up a visit with Dr Mallie Mussel for CBT Will work on stress reduction and mindful eating Will be working on increasing exercise to help manage stress better Her mom is her support system.   Vitamin D deficiency Assessment & Plan: Taking prescription vitamin D 50,000 IU once weekly.  Last vitamin D level was 40.9 with a goal of 50-70.  Will recheck vitamin D level next visit.  Last vitamin D Lab Results  Component Value Date   VD25OH 40.9 04/01/2022     Orders: -     Vitamin D (Ergocalciferol); Take 1 capsule (50,000 Units total) by mouth every 7 (seven) days.  Dispense: 5 capsule; Refill: 0      No follow-ups  on file.Marland Kitchen She was informed of the importance of frequent follow up visits to maximize her success with intensive lifestyle modifications for her multiple health conditions.   ATTESTASTION STATEMENTS:  Reviewed by clinician on day of visit: allergies, medications, problem list, medical history, surgical history, family history, social history, and previous encounter notes.   I have personally spent 30 minutes total time today in preparation, patient care, nutritional counseling and documentation for this visit, including the following: review of clinical lab tests; review of medical tests/procedures/services.     Dell Ponto, DO

## 2022-09-22 NOTE — Assessment & Plan Note (Signed)
Taking prescription vitamin D 50,000 IU once weekly.  Last vitamin D level was 40.9 with a goal of 50-70.  Will recheck vitamin D level next visit.  Last vitamin D Lab Results  Component Value Date   VD25OH 40.9 04/01/2022

## 2022-09-23 LAB — VITAMIN D 25 HYDROXY (VIT D DEFICIENCY, FRACTURES): Vit D, 25-Hydroxy: 57 ng/mL (ref 30.0–100.0)

## 2022-09-23 LAB — HEMOGLOBIN A1C
Est. average glucose Bld gHb Est-mCnc: 120 mg/dL
Hgb A1c MFr Bld: 5.8 % — ABNORMAL HIGH (ref 4.8–5.6)

## 2022-09-30 ENCOUNTER — Telehealth (INDEPENDENT_AMBULATORY_CARE_PROVIDER_SITE_OTHER): Payer: 59 | Admitting: Psychology

## 2022-10-04 ENCOUNTER — Telehealth: Payer: Self-pay | Admitting: *Deleted

## 2022-10-04 NOTE — Telephone Encounter (Addendum)
Received on (10/03/2022) via of fax DME Standard Written Order from [facility].  Requesting signature and return.  Given to provider to sign.   DME Standard Written Order signed and faxed back to [facility].  Confirmation received and copy scanned into the chart.//AB/CMA

## 2022-10-12 ENCOUNTER — Other Ambulatory Visit: Payer: Self-pay | Admitting: Internal Medicine

## 2022-10-21 ENCOUNTER — Telehealth (INDEPENDENT_AMBULATORY_CARE_PROVIDER_SITE_OTHER): Payer: Self-pay | Admitting: Family Medicine

## 2022-10-21 ENCOUNTER — Encounter (INDEPENDENT_AMBULATORY_CARE_PROVIDER_SITE_OTHER): Payer: Self-pay | Admitting: Family Medicine

## 2022-10-21 ENCOUNTER — Ambulatory Visit (INDEPENDENT_AMBULATORY_CARE_PROVIDER_SITE_OTHER): Payer: 59 | Admitting: Family Medicine

## 2022-10-21 VITALS — BP 127/84 | HR 87 | Temp 97.7°F | Ht 63.0 in | Wt 227.0 lb

## 2022-10-21 DIAGNOSIS — E559 Vitamin D deficiency, unspecified: Secondary | ICD-10-CM

## 2022-10-21 DIAGNOSIS — R457 State of emotional shock and stress, unspecified: Secondary | ICD-10-CM | POA: Diagnosis not present

## 2022-10-21 DIAGNOSIS — R7303 Prediabetes: Secondary | ICD-10-CM

## 2022-10-21 DIAGNOSIS — Z6841 Body Mass Index (BMI) 40.0 and over, adult: Secondary | ICD-10-CM

## 2022-10-21 MED ORDER — TIRZEPATIDE 5 MG/0.5ML ~~LOC~~ SOAJ: 5.0000 mg | SUBCUTANEOUS | 0 refills | Status: DC

## 2022-10-21 NOTE — Assessment & Plan Note (Signed)
Pt has struggled to see weight loss over the past 6 mos, up 3 lb Feeling little satiety on Ozempic despite increasing dose Has been inconsistent with diet and exercise.  Plan: Begin logging daily intake on the MyFitnessPal ap, aiming for 1300 kcal/ day which should include 90+ g of protein daily. Increase walking time to 30 min 4 days/ wk.

## 2022-10-21 NOTE — Progress Notes (Signed)
Office: [phone]  /  Fax: [facility]  Starting Date: 03/31/22  Starting Weight: 224lb   Weight Lost Since Last Visit: 0   Vitals Temp: 97.7 F (36.5 C) BP: 127/84 Pulse Rate: 87 SpO2: 98 %   Body Composition  Body Fat %: 43 % Fat Mass (lbs): 97.8 lbs Muscle Mass (lbs): 123.2 lbs Total Body Water (lbs): 81.6 lbs Visceral Fat Rating : 13     HPI  Chief Complaint: OBESITY  [name] is here to discuss her progress with her obesity treatment plan. She is on the keeping a food journal and adhering to recommended goals of 1300 calories and 90 protein and states she is following her eating plan approximately 50 % of the time. She states she is exercising 20 minutes 2 times per week.   Interval History:  Since last office visit she is up 5 lb She no longer feels satiety on Ozempic even moving from 0.5 mg to 1 mg dose She has been working on stress reduction working with a a Social worker at work for high stress She did gain 2.2 lb of muscle in the past month She has been walking the dog but has Spring allergies She plans to go to the gym more She is getting in a protein shake most days of the week  Pharmacotherapy: Ozempic  PHYSICAL EXAM:  Blood pressure 127/84, pulse 87, temperature 97.7 F (36.5 C), height 5\' 3"  (1.6 m), weight 227 lb (103 kg), SpO2 98 %. Body mass index is 40.21 kg/m.  General: She is overweight, cooperative, alert, well developed, and in no acute distress. PSYCH: Has normal mood, affect and thought process.   Lungs: Normal breathing effort, no conversational dyspnea.   ASSESSMENT AND PLAN  TREATMENT PLAN FOR OBESITY:  Recommended Dietary Goals  [name] is currently in the action stage of change. As such, her goal is to continue weight management plan. She has agreed to keeping a food journal and adhering to recommended goals of 1300  calories and 90+ g of  protein.  Behavioral Intervention  We  discussed the following Behavioral Modification Strategies today: increasing lean protein intake, increasing vegetables, increasing water intake, work on meal planning and easy cooking plans, decreasing eating out, consumption of processed foods, and making healthy choices when eating convenient foods, reading food labels , emotional eating strategies and understanding the difference between hunger signals and cravings, and work on managing stress, creating time for self-care and relaxation measures.  Additional resources provided today: NA  Recommended Physical Activity Goals  [name] has been advised to work up to 150 minutes of moderate intensity aerobic activity a week and strengthening exercises 2-3 times per week for cardiovascular health, weight loss maintenance and preservation of muscle mass.   She has agreed to Work on scheduling and tracking physical activity.   Pharmacotherapy changes for the treatment of obesity: change Ozempic to Mounjaro 5 mg weekly  ASSOCIATED CONDITIONS ADDRESSED TODAY  Prediabetes Assessment & Plan: Lab Results  Component Value Date   HGBA1C 5.8 (H) 09/22/2022   Reviewed lab from last visit.  Plan: Avoid high sugar foods and drinks. Increase walking time. Change Ozempic 1 mg --> Mounjaro 5 mg [place] weekly injection.  Orders: -     Tirzepatide; Inject 5 mg into the skin once a week.  Dispense: 2 mL; Refill: 0  Vitamin D deficiency Assessment & Plan: Last vitamin D Lab Results  Component Value Date   VD25OH 57.0 09/22/2022  Reviewed lab from last visit. Has taken RX vitamin D 50,000 IU weekly for the past 4 mos. Vitamin D improved from 40.9 --> 57.  Plan: Discontinue RX vitamin D weekly. Begin OTC Vitamin D3 2,000 IU daily for maintenance Recheck in 6 mos   Emotional stress Assessment & Plan: She declined CBT visit with Dr Mallie Mussel but is seeing an EAP counselor through work and has been working on stress reduction techniques.  Continue  counseling. Aim for 8 hrs of sleep at night Keep junk food out of the house    Morbid obesity ([place]) Assessment & Plan: Pt has struggled to see weight loss over the past 6 mos, up 3 lb Feeling little satiety on Ozempic despite increasing dose Has been inconsistent with diet and exercise.  Plan: Begin logging daily intake on the MyFitnessPal ap, aiming for 1300 kcal/ day which should include 90+ g of protein daily. Increase walking time to 30 min 4 days/ wk.   BMI 40.0-44.9, adult [facility])      She was informed of the importance of frequent follow up visits to maximize her success with intensive lifestyle modifications for her multiple health conditions.   ATTESTASTION STATEMENTS:  Reviewed by clinician on day of visit: allergies, medications, problem list, medical history, surgical history, family history, social history, and previous encounter notes pertinent to obesity diagnosis.   I have personally spent 30 minutes total time today in preparation, patient care, nutritional counseling and documentation for this visit, including the following: review of clinical lab tests; review of medical tests/procedures/services.      Dell Ponto, DO DABFM, DABOM [facility] [address] [place], [place] [address] [phone]

## 2022-10-21 NOTE — Assessment & Plan Note (Signed)
She declined CBT visit with Dr Mallie Mussel but is seeing an EAP counselor through work and has been working on stress reduction techniques.  Continue counseling. Aim for 8 hrs of sleep at night Keep junk food out of the house

## 2022-10-21 NOTE — Telephone Encounter (Signed)
Prior authorization done via cover my meds for patients Mounjaro.  [name] (Key: [id number]) PA Case ID #: [id number] Rx #: [id number]

## 2022-10-21 NOTE — Assessment & Plan Note (Signed)
Last vitamin D Lab Results  Component Value Date   VD25OH 57.0 09/22/2022   Reviewed lab from last visit. Has taken RX vitamin D 50,000 IU weekly for the past 4 mos. Vitamin D improved from 40.9 --> 57.  Plan: Discontinue RX vitamin D weekly. Begin OTC Vitamin D3 2,000 IU daily for maintenance Recheck in 6 mos

## 2022-10-21 NOTE — Assessment & Plan Note (Signed)
Lab Results  Component Value Date   HGBA1C 5.8 (H) 09/22/2022   Reviewed lab from last visit.  Plan: Avoid high sugar foods and drinks. Increase walking time. Change Ozempic 1 mg --> Mounjaro 5 mg [place] weekly injection.

## 2022-10-23 ENCOUNTER — Encounter (INDEPENDENT_AMBULATORY_CARE_PROVIDER_SITE_OTHER): Payer: Self-pay

## 2022-10-23 ENCOUNTER — Telehealth (INDEPENDENT_AMBULATORY_CARE_PROVIDER_SITE_OTHER): Payer: Self-pay | Admitting: Family Medicine

## 2022-10-23 NOTE — Telephone Encounter (Signed)
Prior authorization denied for her Mounjaro. Patient notified and Dr. Valetta Close.

## 2022-10-23 NOTE — Telephone Encounter (Signed)
Message sent to provider 

## 2022-10-23 NOTE — Telephone Encounter (Signed)
Patient states that her Darcel Bayley was denied by pharmacy. Pt Ozempic was changed to [facility] by Dr. Valetta Close. Pt's pharmacy advised the patient to call Dr. Valetta Close because the PA was denied for [facility]. Please call pt at the number on file. [facility].

## 2022-10-23 NOTE — Telephone Encounter (Signed)
Prior authorization done via cover my meds for patients Zepbound. Waiting on determination.

## 2022-10-27 ENCOUNTER — Telehealth (INDEPENDENT_AMBULATORY_CARE_PROVIDER_SITE_OTHER): Payer: Self-pay | Admitting: Family Medicine

## 2022-10-27 NOTE — Telephone Encounter (Signed)
Patient states that she cannot receiver her RX of Zepbound until pt's pharmacy receives a prior authorization. Please cal patient today at the number on file. [facility].

## 2022-10-28 MED ORDER — SEMAGLUTIDE (1 MG/DOSE) 4 MG/3ML ~~LOC~~ SOPN: 1.0000 mg | PEN_INJECTOR | SUBCUTANEOUS | 0 refills | Status: DC

## 2022-10-29 ENCOUNTER — Telehealth (INDEPENDENT_AMBULATORY_CARE_PROVIDER_SITE_OTHER): Payer: Self-pay

## 2022-10-29 NOTE — Telephone Encounter (Addendum)
PA for Zepbound was denied on 10/23/22, archiving today 10/29/22  PA for Darcel Bayley was denied on 10/21/22, archiving today 10/29/22

## 2022-11-11 LAB — HM DIABETES EYE EXAM

## 2022-11-20 ENCOUNTER — Ambulatory Visit: Payer: 59 | Admitting: Internal Medicine

## 2022-11-20 ENCOUNTER — Encounter: Payer: Self-pay | Admitting: Internal Medicine

## 2022-11-20 VITALS — BP 128/82 | HR 87 | Temp 98.2°F | Ht 63.0 in | Wt 235.0 lb

## 2022-11-20 DIAGNOSIS — E538 Deficiency of other specified B group vitamins: Secondary | ICD-10-CM | POA: Diagnosis not present

## 2022-11-20 DIAGNOSIS — E559 Vitamin D deficiency, unspecified: Secondary | ICD-10-CM

## 2022-11-20 DIAGNOSIS — D508 Other iron deficiency anemias: Secondary | ICD-10-CM

## 2022-11-20 DIAGNOSIS — I10 Essential (primary) hypertension: Secondary | ICD-10-CM | POA: Diagnosis not present

## 2022-11-20 MED ORDER — AIRSUPRA 90-80 MCG/ACT IN AERO: 2.0000 | INHALATION_SPRAY | RESPIRATORY_TRACT | 5 refills | Status: AC | PRN

## 2022-11-20 NOTE — Patient Instructions (Addendum)
18:6 intermittent fasting is an eating strategy that involves fasting for 18 hours and then eating during a 6-hour window. During this fasting period, you may drink water, tea, coffee, or other zero-calorie beverages. A feeding window follows this fasting window where you eat healthy, nutrient-dense foods

## 2022-11-20 NOTE — Progress Notes (Signed)
Subjective:  Patient ID: [name], female    DOB: 13-Apr-1971  Age: 52 y.o. MRN: [id number]  CC: Medical Management of Chronic Issues (80mo follow up-concerns of weight gain, ozempic not helping and insurance will not cover mounjaro. Weight gain causing leg pain and breathing issues)   HPI [name] presents for wt gain - seeing Dr Cathey Endow On Ozempic 2 mg/d  C/o wheezing, sneezing  Outpatient Medications Prior to Visit  Medication Sig Dispense Refill   ALPRAZolam (XANAX) 1 MG tablet Take 1 tablet (1 mg total) by mouth 3 times/day as needed-between meals & bedtime for anxiety. 90 tablet 1   Ascorbic Acid (VITAMIN C PO) Take by mouth.     chlorhexidine (HIBICLENS) 4 % external liquid Apply topically daily as needed. Use 3/week 1000 mL 3   Cholecalciferol 25 MCG (1000 UT) tablet Take 2,000 Units by mouth daily.     cyclobenzaprine (FLEXERIL) 10 MG tablet Take 1 tablet (10 mg total) by mouth at bedtime. 90 tablet 3   doxycycline (VIBRA-TABS) 100 MG tablet Take 1 tablet (100 mg total) by mouth 2 (two) times daily. 20 tablet 3   Doxylamine-Phenylephrine-APAP (VICKS SINEX NIGHTTIME PO) Take by mouth.     fluticasone (FLONASE) 50 MCG/ACT nasal spray SPRAY 2 SPRAYS INTO EACH NOSTRIL EVERY DAY 48 mL 0   hydrOXYzine (VISTARIL) 25 MG capsule Take 1 capsule (25 mg total) by mouth at bedtime. 90 capsule 1   hyoscyamine (LEVSIN SL) 0.125 MG SL tablet Place 1 tablet (0.125 mg total) under the tongue every 4 (four) hours as needed. 45 tablet 1   LINZESS 290 MCG CAPS capsule TAKE 1 CAPSULE BY MOUTH DAILY BEFORE BREAKFAST. 30 capsule 3   montelukast (SINGULAIR) 10 MG tablet TAKE 1 TABLET BY MOUTH EVERYDAY AT BEDTIME 90 tablet 1   mupirocin cream (BACTROBAN) 2 % Apply 1 application topically 2 (two) times daily. 30 g 3   naproxen (NAPROSYN) 500 MG tablet TAKE 1 TABLET BY MOUTH TWICE A DAY AS NEEDED FOR MODERATE PAIN OR HEADACHE 60 tablet 3   norethindrone-ethinyl estradiol (FYAVOLV) 1-5 MG-MCG  TABS tablet Take by mouth daily.     Olmesartan-amLODIPine-HCTZ 40-5-12.5 MG TABS TAKE 1 TABLET BY MOUTH EVERY DAY 90 tablet 2   pantoprazole (PROTONIX) 40 MG tablet TAKE 1 TABLET BY MOUTH EVERY DAY 90 tablet 1   Potassium Chloride ER 20 MEQ TBCR TAKE 1 TABLET BY MOUTH EVERY DAY 90 tablet 1   Prenatal Vit-Fe Fumarate-FA (M-NATAL PLUS) 27-1 MG TABS TAKE 1 TABLET BY MOUTH EVERY DAY 90 tablet 1   rizatriptan (MAXALT) 10 MG tablet TAKE 1 TABLET BY MOUTH ONCE AS NEEDED FOR UP TO 1 DOSE FOR MIGRAINE. MAY REPEAT IN 2 HOURS 12 tablet 5   Semaglutide, 1 MG/DOSE, 4 MG/3ML SOPN Inject 1 mg as directed once a week. 9 mL 0   triamcinolone (KENALOG) 0.1 % paste Use as directed 1 application. in the mouth or throat 3 (three) times daily. On canker sores 5 g 1   valACYclovir (VALTREX) 1000 MG tablet Take 1 tablet (1,000 mg total) by mouth 3 (three) times daily. 21 tablet 1   Vitamin D, Ergocalciferol, (DRISDOL) 1.25 MG (50000 UNIT) CAPS capsule Take 1 capsule (50,000 Units total) by mouth every 7 (seven) days. 5 capsule 0   No facility-administered medications prior to visit.    ROS: Review of Systems  Constitutional:  Positive for unexpected weight change. Negative for activity change, appetite change, chills and fatigue.  HENT:  Positive for congestion and sinus pressure. Negative for mouth sores.   Eyes:  Negative for visual disturbance.  Respiratory:  Positive for wheezing. Negative for cough and chest tightness.   Gastrointestinal:  Negative for abdominal pain and nausea.  Genitourinary:  Negative for difficulty urinating, frequency and vaginal pain.  Musculoskeletal:  Negative for back pain and gait problem.  Skin:  Negative for pallor and rash.  Neurological:  Negative for dizziness, tremors, weakness, numbness and headaches.  Psychiatric/Behavioral:  Negative for confusion and sleep disturbance.     Objective:  BP 128/82   Pulse 87   Temp 98.2 F (36.8 C) (Oral)   Ht 5\' 3"  (1.6 m)   Wt 235  lb (106.6 kg)   SpO2 94%   BMI 41.63 kg/m   BP Readings from Last 3 Encounters:  11/20/22 128/82  10/21/22 127/84  09/22/22 127/86    Wt Readings from Last 3 Encounters:  11/20/22 235 lb (106.6 kg)  10/21/22 227 lb (103 kg)  09/22/22 222 lb (100.7 kg)    Physical Exam Constitutional:      General: She is not in acute distress.    Appearance: She is well-developed. She is obese.  HENT:     Head: Normocephalic.     Right Ear: External ear normal.     Left Ear: External ear normal.     Nose: Nose normal.  Eyes:     General:        Right eye: No discharge.        Left eye: No discharge.     Conjunctiva/sclera: Conjunctivae normal.     Pupils: Pupils are equal, round, and reactive to light.  Neck:     Thyroid: No thyromegaly.     Vascular: No JVD.     Trachea: No tracheal deviation.  Cardiovascular:     Rate and Rhythm: Normal rate and regular rhythm.     Heart sounds: Normal heart sounds.  Pulmonary:     Effort: No respiratory distress.     Breath sounds: No stridor. No wheezing.  Abdominal:     General: Bowel sounds are normal. There is no distension.     Palpations: Abdomen is soft. There is no mass.     Tenderness: There is no abdominal tenderness. There is no guarding or rebound.  Musculoskeletal:        General: No tenderness.     Cervical back: Normal range of motion and neck supple. No rigidity.  Lymphadenopathy:     Cervical: No cervical adenopathy.  Skin:    Findings: No erythema or rash.  Neurological:     Cranial Nerves: No cranial nerve deficit.     Motor: No abnormal muscle tone.     Coordination: Coordination normal.     Deep Tendon Reflexes: Reflexes normal.  Psychiatric:        Behavior: Behavior normal.        Thought Content: Thought content normal.        Judgment: Judgment normal.     Lab Results  Component Value Date   WBC 5.0 04/01/2022   HGB 13.5 04/01/2022   HCT 40.6 04/01/2022   PLT 164 04/01/2022   GLUCOSE 86 04/01/2022    CHOL 170 04/01/2022   TRIG 65 04/01/2022   HDL 55 04/01/2022   LDLCALC 102 (H) 04/01/2022   ALT 14 04/01/2022   AST 20 04/01/2022   NA 140 04/01/2022   K 4.0 04/01/2022   CL 100 04/01/2022   CREATININE  0.59 04/01/2022   BUN 7 04/01/2022   CO2 22 04/01/2022   TSH 0.529 04/01/2022   INR 0.9 RATIO 03/15/2007   HGBA1C 5.8 (H) 09/22/2022    CT Abdomen Pelvis W Contrast  Result Date: 01/13/2022 CLINICAL DATA:  Severe abdominal bloating, upper abdominal mass. Bowel obstruction. EXAM: CT ABDOMEN AND PELVIS WITH CONTRAST TECHNIQUE: Multidetector CT imaging of the abdomen and pelvis was performed using the standard protocol following bolus administration of intravenous contrast. RADIATION DOSE REDUCTION: This exam was performed according to the departmental dose-optimization program which includes automated exposure control, adjustment of the mA and/or kV according to patient size and/or use of iterative reconstruction technique. CONTRAST:  OMNIPAQUE IOHEXOL 300 MG/ML  SOLN COMPARISON:  None Available. FINDINGS: Lower chest: No acute abnormality. Hepatobiliary: No focal liver abnormality is seen. No gallstones, gallbladder wall thickening, or biliary dilatation. Pancreas: Unremarkable Spleen: Unremarkable Adrenals/Urinary Tract: Stable 2.2 cm right adrenal adenoma. Stable 13 mm more posterior right adrenal nodule, previously characterized as a lipid poor adenoma. Left adrenal gland is unremarkable. The kidneys are normal in size and position. Simple cortical cyst noted within the lower pole the left kidney. No follow-up imaging is recommended for this lesion. The kidneys are otherwise unremarkable. Bladder unremarkable. Stomach/Bowel: Mild sigmoid diverticulosis. A small umbilical hernia contains a single unremarkable loop of small bowel. The stomach, small bowel, and large bowel are otherwise unremarkable. No evidence of obstruction or focal inflammation. No free intraperitoneal gas or fluid.  Appendix normal. Vascular/Lymphatic: Circumaortic left renal vein. The abdominal vasculature is otherwise unremarkable. No pathologic adenopathy within the abdomen and pelvis. Reproductive: The uterus is lobulated and demonstrates heterogeneous enhancement with scattered coarse calcifications in keeping with multiple underlying uterine fibroids. The pelvic organs are otherwise unremarkable. Other: None Musculoskeletal: Degenerative changes seen within the lumbar spine. No acute bone abnormality. No lytic or blastic bone lesion. IMPRESSION: No acute intra-abdominal pathology identified. No bowel obstruction. No definite radiographic explanation for the patient's reported symptoms. Stable 2.2 cm benign right adrenal adenoma. Stable 1.3 cm more posterior right adrenal nodule, previously characterized as a lipid poor adenoma. Mild sigmoid diverticulosis without superimposed acute inflammatory change. Fibroid uterus. Small umbilical hernia containing and unremarkable loop of small bowel, similar to prior examination. Electronically Signed   By: Helyn Numbers M.D.   On: 01/13/2022 02:02    Assessment & Plan:   Problem List Items Addressed This Visit       Cardiovascular and Mediastinum   Essential hypertension - Primary     Other   Anemia    No relapse      B12 deficiency    On B12      OBESITY, MORBID    Ozempic 2 mg/wk - not working any more per pt F/u w/Dr Cathey Endow Try 18:6 intermittent fasting is an eating strategy that involves fasting for 18 hours and then eating during a 6-hour window. During this fasting period, you may drink water, tea, coffee, or other zero-calorie beverages. A feeding window follows this fasting window where you eat healthy, nutrient-dense foods Walk >5000 steps/d Get more sleep      Vitamin D deficiency    On Vit D         Meds ordered this encounter  Medications   Albuterol-Budesonide (AIRSUPRA) 90-80 MCG/ACT AERO    Sig: Inhale 2 Inhalations into the lungs  every 4 (four) hours as needed.    Dispense:  10 g    Refill:  5  Follow-up: Return in about 6 months (around 05/22/2023) for Wellness Exam.  Sonda Primes, MD

## 2022-11-20 NOTE — Assessment & Plan Note (Signed)
On B12 

## 2022-11-20 NOTE — Assessment & Plan Note (Addendum)
Ozempic 2 mg/wk - not working any more per pt F/u w/Dr Cathey Endow Try 18:6 intermittent fasting is an eating strategy that involves fasting for 18 hours and then eating during a 6-hour window. During this fasting period, you may drink water, tea, coffee, or other zero-calorie beverages. A feeding window follows this fasting window where you eat healthy, nutrient-dense foods Walk >5000 steps/d Get more sleep

## 2022-11-20 NOTE — Assessment & Plan Note (Signed)
On Vit D 

## 2022-11-20 NOTE — Assessment & Plan Note (Signed)
No relapse 

## 2022-11-24 ENCOUNTER — Ambulatory Visit (INDEPENDENT_AMBULATORY_CARE_PROVIDER_SITE_OTHER): Payer: 59 | Admitting: Family Medicine

## 2022-11-24 ENCOUNTER — Other Ambulatory Visit (INDEPENDENT_AMBULATORY_CARE_PROVIDER_SITE_OTHER): Payer: Self-pay | Admitting: Family Medicine

## 2022-11-24 ENCOUNTER — Encounter (INDEPENDENT_AMBULATORY_CARE_PROVIDER_SITE_OTHER): Payer: Self-pay | Admitting: Family Medicine

## 2022-11-24 VITALS — BP 123/86 | HR 92 | Temp 98.0°F | Ht 63.0 in | Wt 230.0 lb

## 2022-11-24 DIAGNOSIS — R7303 Prediabetes: Secondary | ICD-10-CM

## 2022-11-24 DIAGNOSIS — Z566 Other physical and mental strain related to work: Secondary | ICD-10-CM

## 2022-11-24 DIAGNOSIS — Z6841 Body Mass Index (BMI) 40.0 and over, adult: Secondary | ICD-10-CM

## 2022-11-24 MED ORDER — SEMAGLUTIDE (2 MG/DOSE) 8 MG/3ML ~~LOC~~ SOPN
2.0000 mg | PEN_INJECTOR | SUBCUTANEOUS | 0 refills | Status: DC
Start: 2022-11-24 — End: 2022-12-22

## 2022-11-24 NOTE — Progress Notes (Signed)
Office: [phone]  /  Fax: [phone]  WEIGHT SUMMARY AND BIOMETRICS  Starting Date: 03/31/22  Starting Weight: 224lb   Weight Lost Since Last Visit: 0   Vitals Temp: 98 F (36.7 C) BP: 123/86 Pulse Rate: 92 SpO2: 98 %   Body Composition  Body Fat %: 43.3 % Fat Mass (lbs): 99.6 lbs Muscle Mass (lbs): 124 lbs Total Body Water (lbs): 82.8 lbs Visceral Fat Rating : 13   HPI  Chief Complaint: OBESITY  [name] is here to discuss [name] progress with [name] obesity treatment plan. She is on the the Category 1 Plan and states she is following [name] eating plan approximately 40 % of the time. She states she is exercising 0 minutes 0 times per week.  Interval History:  Since last office visit she is up 3 lb She is up 0.8 lb of muscle mass and is up 1.8 lb of body fat She is not eating all the foods on [name] meal plan She has more work stress For breakfast, she is skipping.  At 1 am, she has a croissant with egg and cheese from the work [employer].  On days off work, she may have a protein shake (20 g whey protein).  She eats lunch out more often on days off work.  She eats some starches. She usually has cafeteria lunch on work days- things like meatloaf, greens and potatoes. She only snacks if she skips lunch like chips or crackers.  She is back home for dinner, eating out most nights- chick [employer]. She is unable to walk outdoors due to pollen She has a Photographer and plans to meet up with trainer  Pharmacotherapy: Ozempic  weekly  PHYSICAL EXAM:  Blood pressure 123/86, pulse 92, temperature 98 F (36.7 C), height  (1.6 m), weight 230 lb (104.3 kg), SpO2 98 %. Body mass index is 40.74 kg/m.  General: She is overweight, cooperative, alert, well developed, and in no acute distress. PSYCH: Has normal mood, affect and thought process.   Lungs: Normal breathing effort, no conversational dyspnea.   ASSESSMENT AND PLAN  TREATMENT PLAN FOR OBESITY:  Recommended  Dietary Goals  [name] is currently in the action stage of change. As such, [name] goal is to continue weight management plan. She has agreed to keeping a food journal and adhering to recommended goals of 1200 calories and 100 g of protein.  Behavioral Intervention  We discussed the following Behavioral Modification Strategies today: increasing lean protein intake, decreasing simple carbohydrates , increasing vegetables, increasing lower glycemic fruits, increasing fiber rich foods, avoiding skipping meals, increasing water intake, work on meal planning and preparation, work on tracking and journaling calories using tracking application, reading food labels , continue to practice mindfulness when eating, and planning for success.  Additional resources provided today: NA  Recommended Physical Activity Goals  [name] has been advised to work up to 150 minutes of moderate intensity aerobic activity a week and strengthening exercises 2-3 times per week for cardiovascular health, weight loss maintenance and preservation of muscle mass.   She has agreed to Work on scheduling and tracking physical activity.   Pharmacotherapy changes for the treatment of obesity: Ozempic increased to 2 mg once weekly injection  ASSOCIATED CONDITIONS ADDRESSED TODAY  Prediabetes Assessment & Plan: Lab Results  Component Value Date   HGBA1C 5.8 (H) 09/22/2022   Previously intolerant to metformin.  She is doing well on Ozempic 1 mg once weekly injection without adverse side effect.  She is  limiting [name] intake of added sugar but continues to consume excess starches and restaurant foods.  She is lacking regular exercise.  She has a net weight gain of 6 pounds in the past 7 months of medically supervised weight management  We reviewed options for easy grab and go meals instead of frequent meals out and cafeteria food.  Continue to limit intake of added sugar and refined carbohydrates.  Recommend adding in gym exercise at  least 2 days a week.  Will increase Ozempic to 2 mg once weekly injection.  Orders: -     Semaglutide (2 MG/DOSE); Inject 2 mg as directed once a week.  Dispense: 3 mL; Refill: 0  Morbid obesity Assessment & Plan: Patient has failed to see much weight loss over the past 5 years of actively trying.  She continues to struggle with eating on a schedule, meal planning and fitting and regular exercise.  She blames work stress and lack of time for associated factors.  She is skipping breakfast some days of the week essentially incorporating some intermittent fasting and has only had increased food cravings later in the day.  I did not recommend intermittent fasting.  She is to maintain about 1200 kcal/day which should include about 100 g of protein daily.  We discussed using a protein shake in place of cafeteria breakfast.  This should contain 20 to 30 g of protein and less than 7 g of sugar.  Made recommendations for bringing a healthy lunch to work approximately 300 to 400 cal, adding in more fruits and vegetables.  I gave [name] a calorie goal for dinner at 500 cal including over 20 g of protein.  Fast food handout given.  She has failed to fit in regular exercise.  She agrees to gym exercise and we set a goal for at least 2 days a week. If patient is unable to follow through with these changes and make progress, bariatric surgery may be an option for [name].   BMI 40.0-44.9, adult  Stress at work Assessment & Plan: Stress at work and lack of time management are big contributing factors to [name] lack of progress over the past 6 to 7 months.  We discussed the importance of making time for meal planning, eating on a schedule and fitting and regular exercise.  She is thinking about planning a vacation for stress reduction.  Consider adding in cognitive behavioral therapy to help patient with emotional eating and time management.       She was informed of the importance of frequent follow up visits to  maximize [name] success with intensive lifestyle modifications for [name] multiple health conditions.   ATTESTASTION STATEMENTS:  Reviewed by clinician on day of visit: allergies, medications, problem list, medical history, surgical history, family history, social history, and previous encounter notes pertinent to obesity diagnosis.   I have personally spent 30 minutes total time today in preparation, patient care, nutritional counseling and documentation for this visit, including the following: review of clinical lab tests; review of medical tests/procedures/services.      Glennis Brink, DO DABFM, DABOM [facility] [address] [place], [place] [address] [phone]

## 2022-11-24 NOTE — Assessment & Plan Note (Signed)
Lab Results  Component Value Date   HGBA1C 5.8 (H) 09/22/2022   Previously intolerant to metformin.  She is doing well on Ozempic 1 mg once weekly injection without adverse side effect.  She is limiting her intake of added sugar but continues to consume excess starches and restaurant foods.  She is lacking regular exercise.  She has a net weight gain of 6 pounds in the past 7 months of medically supervised weight management  We reviewed options for easy grab and go meals instead of frequent meals out and cafeteria food.  Continue to limit intake of added sugar and refined carbohydrates.  Recommend adding in gym exercise at least 2 days a week.  Will increase Ozempic to 2 mg once weekly injection.

## 2022-11-24 NOTE — Assessment & Plan Note (Signed)
Patient has failed to see much weight loss over the past 5 years of actively trying.  She continues to struggle with eating on a schedule, meal planning and fitting and regular exercise.  She blames work stress and lack of time for associated factors.  She is skipping breakfast some days of the week essentially incorporating some intermittent fasting and has only had increased food cravings later in the day.  I did not recommend intermittent fasting.  She is to maintain about 1200 kcal/day which should include about 100 g of protein daily.  We discussed using a protein shake in place of cafeteria breakfast.  This should contain 20 to 30 g of protein and less than 7 g of sugar.  Made recommendations for bringing a healthy lunch to work approximately 300 to 400 cal, adding in more fruits and vegetables.  I gave her a calorie goal for dinner at 500 cal including over 20 g of protein.  Fast food handout given.  She has failed to fit in regular exercise.  She agrees to gym exercise and we set a goal for at least 2 days a week. If patient is unable to follow through with these changes and make progress, bariatric surgery may be an option for her.

## 2022-11-24 NOTE — Assessment & Plan Note (Signed)
Stress at work and lack of time management are big contributing factors to her lack of progress over the past 6 to 7 months.  We discussed the importance of making time for meal planning, eating on a schedule and fitting and regular exercise.  She is thinking about planning a vacation for stress reduction.  Consider adding in cognitive behavioral therapy to help patient with emotional eating and time management.

## 2022-11-25 ENCOUNTER — Telehealth (INDEPENDENT_AMBULATORY_CARE_PROVIDER_SITE_OTHER): Payer: Self-pay | Admitting: Family Medicine

## 2022-11-25 NOTE — Telephone Encounter (Signed)
Pt called 11/25/22 pharmacy need prior authorization for Ozempic [facility]

## 2022-11-27 ENCOUNTER — Telehealth (INDEPENDENT_AMBULATORY_CARE_PROVIDER_SITE_OTHER): Payer: Self-pay | Admitting: Family Medicine

## 2022-11-27 NOTE — Telephone Encounter (Signed)
[facility] insurance called back and said since patient does not have a diagnosis of diabetes the Ozempic will not be covered. Patient has been notified.

## 2022-11-27 NOTE — Telephone Encounter (Signed)
Prior authorization done via cover my meds. Waiting on determination.  [name] (Key: [id number]) Ozempic (2 MG/DOSE) /3ML pen-injectors

## 2022-11-27 NOTE — Telephone Encounter (Signed)
Left voicemail for [employer] from [facility]. I have completed a PA for her Ozempic. Waiting on determination.

## 2022-11-27 NOTE — Telephone Encounter (Signed)
[name] with [employer] [phone], ext [phone]) called wanting to inquire if a prior authorization had been completed for patient's Ozempic . Please call the patient at the number on file. [facility].

## 2022-12-01 ENCOUNTER — Telehealth (INDEPENDENT_AMBULATORY_CARE_PROVIDER_SITE_OTHER): Payer: Self-pay | Admitting: *Deleted

## 2022-12-01 NOTE — Telephone Encounter (Signed)
Patient called this morning to let us know that she picked up the [facility] at [facility] this weekend even though we got the denial letter. Per [facility] had to [facility] in [facility] and she was able to get it for 24$. Follow up with patient at her next appointment.

## 2022-12-07 ENCOUNTER — Other Ambulatory Visit: Payer: Self-pay | Admitting: Internal Medicine

## 2022-12-21 ENCOUNTER — Other Ambulatory Visit: Payer: Self-pay | Admitting: Gastroenterology

## 2022-12-21 DIAGNOSIS — Z1211 Encounter for screening for malignant neoplasm of colon: Secondary | ICD-10-CM

## 2022-12-21 DIAGNOSIS — R140 Abdominal distension (gaseous): Secondary | ICD-10-CM

## 2022-12-21 DIAGNOSIS — D128 Benign neoplasm of rectum: Secondary | ICD-10-CM

## 2022-12-22 ENCOUNTER — Ambulatory Visit (INDEPENDENT_AMBULATORY_CARE_PROVIDER_SITE_OTHER): Payer: 59 | Admitting: Family Medicine

## 2022-12-22 ENCOUNTER — Encounter (INDEPENDENT_AMBULATORY_CARE_PROVIDER_SITE_OTHER): Payer: Self-pay | Admitting: Family Medicine

## 2022-12-22 VITALS — BP 138/83 | HR 84 | Temp 97.6°F | Ht 63.0 in | Wt 233.0 lb

## 2022-12-22 DIAGNOSIS — R7303 Prediabetes: Secondary | ICD-10-CM

## 2022-12-22 DIAGNOSIS — E669 Obesity, unspecified: Secondary | ICD-10-CM

## 2022-12-22 DIAGNOSIS — E559 Vitamin D deficiency, unspecified: Secondary | ICD-10-CM | POA: Diagnosis not present

## 2022-12-22 DIAGNOSIS — F4321 Adjustment disorder with depressed mood: Secondary | ICD-10-CM | POA: Diagnosis not present

## 2022-12-22 DIAGNOSIS — Z6841 Body Mass Index (BMI) 40.0 and over, adult: Secondary | ICD-10-CM

## 2022-12-22 MED ORDER — SEMAGLUTIDE (2 MG/DOSE) 8 MG/3ML ~~LOC~~ SOPN
2.0000 mg | PEN_INJECTOR | SUBCUTANEOUS | 1 refills | Status: DC
Start: 2022-12-22 — End: 2023-06-22

## 2022-12-22 NOTE — Assessment & Plan Note (Addendum)
Lab Results  Component Value Date   HGBA1C 5.8 (H) 09/22/2022   She is doing well on Ozempic 2 mg once weekly injection without GI upset She has improved satiety but continues to eat past 'full' She is working on reducing her intake of starches and sweets She has yet to add in exercise  Continue Ozempic 2 mg once weekly injection Begin dietary logging Plan for 30 min of walking 3 x a week Repeat A1c and CMP next visit

## 2022-12-22 NOTE — Assessment & Plan Note (Signed)
Pt reports 3 recent deaths in her family that has lead to some emotional eating. In addition, she has chronic job stress and has struggled to move forward with making healthy lifestyle changes due to these issues She has a good support system and is looking into changing positions at work  We discussed the normal grieving process, leaning on family for support but also working on self care, sleep, proper nutrition and mindful eating Keep junk food out of the house and minimize meals out

## 2022-12-22 NOTE — Assessment & Plan Note (Signed)
Last vitamin D Lab Results  Component Value Date   VD25OH 57.0 09/22/2022   She has finished out RX vitamin D 50,000 IU weekly Last vitamin D level at goal  Recommend OTC Vitamin D 3 2,000 IU daily for maintenance Recheck vitamin D level next visit

## 2022-12-22 NOTE — Progress Notes (Signed)
Office: [phone]  /  Fax: [phone]  WEIGHT SUMMARY AND BIOMETRICS  Starting Date: 03/31/22  Starting Weight: 224lb   Weight Lost Since Last Visit: 0   Vitals Temp: 97.6 F (36.4 C) BP: 138/83 Pulse Rate: 84 SpO2: 99 %   Body Composition  Body Fat %: 44.3 % Fat Mass (lbs): 103.6 lbs Muscle Mass (lbs): 123.6 lbs Total Body Water (lbs): 84.6 lbs Visceral Fat Rating : 14     HPI  Chief Complaint: OBESITY  [name] is here to discuss her progress with her obesity treatment plan. She is on the keeping a food journal and adhering to recommended goals of 1200 calories and 100 protein and states she is following her eating plan approximately 40-50 % of the time. She states she is exercising 0 minutes 0 times per week.   Interval History:  Since last office visit she is up 3 lb She has had 3 deaths in her family and admits to more emotional eating Stress levels are work are high and she is looking at a different position She is bringing lean cuisines to work more than eating fast food She has some improved satiety with Ozempic 2 mg dose She is sleeping well but has not started to exercise She plans to start getting in the pool She has declined weight loss surgery  Pharmacotherapy: Ozempic 2 mg weekly  PHYSICAL EXAM:  Blood pressure 138/83, pulse 84, temperature 97.6 F (36.4 C), height 5\' 3"  (1.6 m), weight 233 lb (105.7 kg), SpO2 99 %. Body mass index is 41.27 kg/m.  General: She is overweight, cooperative, alert, well developed, and in no acute distress. PSYCH: Has normal mood, affect and thought process.   Lungs: Normal breathing effort, no conversational dyspnea.   ASSESSMENT AND PLAN  TREATMENT PLAN FOR OBESITY:  Recommended Dietary Goals  [name] is currently in the action stage of change. As such, her goal is to continue weight management plan. She has agreed to keeping a food journal and adhering to recommended goals of 1200 calories and 90 g  of  protein.  Behavioral Intervention  We discussed the following Behavioral Modification Strategies today: increasing lean protein intake, decreasing simple carbohydrates , increasing vegetables, increasing lower glycemic fruits, increasing water intake, work on meal planning and preparation, work on Counselling psychologist calories using tracking application, keeping healthy foods at home, work on managing stress, creating time for self-care and relaxation measures, avoiding temptations and identifying enticing environmental cues, continue to practice mindfulness when eating, and planning for success.  Additional resources provided today: NA  Recommended Physical Activity Goals  [name] has been advised to work up to 150 minutes of moderate intensity aerobic activity a week and strengthening exercises 2-3 times per week for cardiovascular health, weight loss maintenance and preservation of muscle mass.   She has agreed to Increase the intensity, frequency or duration of aerobic exercises    Pharmacotherapy changes for the treatment of obesity:   ASSOCIATED CONDITIONS ADDRESSED TODAY  Prediabetes Assessment & Plan: Lab Results  Component Value Date   HGBA1C 5.8 (H) 09/22/2022   She is doing well on Ozempic 2 mg once weekly injection without GI upset She has improved satiety but continues to eat past 'full' She is working on reducing her intake of starches and sweets She has yet to add in exercise  Continue Ozempic 2 mg once weekly injection Begin dietary logging Plan for 30 min of walking 3 x a week Repeat A1c and CMP next visit  Orders: -     Semaglutide (2 MG/DOSE); Inject 2 mg as directed once a week.  Dispense: 3 mL; Refill: 1  Vitamin D deficiency Assessment & Plan: Last vitamin D Lab Results  Component Value Date   VD25OH 57.0 09/22/2022   She has finished out RX vitamin D 50,000 IU weekly Last vitamin D level at goal  Recommend OTC Vitamin D 3 2,000 IU daily for  maintenance Recheck vitamin D level next visit   OBESITY, MORBID WITH STARTING BMI 39  BMI 40.0-44.9, adult (HCC)  Reaction, adjustment, with depressed mood, brief Assessment & Plan: Pt reports 3 recent deaths in her family that has lead to some emotional eating. In addition, she has chronic job stress and has struggled to move forward with making healthy lifestyle changes due to these issues She has a good support system and is looking into changing positions at work  We discussed the normal grieving process, leaning on family for support but also working on self care, sleep, proper nutrition and mindful eating Keep junk food out of the house and minimize meals out       She was informed of the importance of frequent follow up visits to maximize her success with intensive lifestyle modifications for her multiple health conditions.   ATTESTASTION STATEMENTS:  Reviewed by clinician on day of visit: allergies, medications, problem list, medical history, surgical history, family history, social history, and previous encounter notes pertinent to obesity diagnosis.   I have personally spent 30 minutes total time today in preparation, patient care, nutritional counseling and documentation for this visit, including the following: review of clinical lab tests; review of medical tests/procedures/services.      Glennis Brink, DO DABFM, DABOM [facility] [address] [place], [place] [address] [phone]

## 2022-12-23 ENCOUNTER — Telehealth (INDEPENDENT_AMBULATORY_CARE_PROVIDER_SITE_OTHER): Payer: Self-pay | Admitting: Family Medicine

## 2022-12-23 NOTE — Telephone Encounter (Signed)
Prior authorization done via cover my meds for patients Ozempic. Waiting on determination.  

## 2022-12-23 NOTE — Telephone Encounter (Signed)
5/21 [employer] is calling because there was a prior authozation put in for [employer] the insurance is stating that there is no Labwork to prove why she need the ozempic . The number is [phone] ext [phone]

## 2022-12-23 NOTE — Telephone Encounter (Signed)
Faxed over office notes to [facility] health care for patients Ozempic. Waiting for determination.

## 2022-12-23 NOTE — Telephone Encounter (Signed)
Left voicemail for Ladona Ridgel from [employer] regarding patients Ozempic prior authorization.

## 2023-01-05 NOTE — Telephone Encounter (Signed)
Received through Cover My Meds: Why was my request denied? This request was denied because you did not meet the following requirements: Based on the information provided, you do not meet the established medication-specific criteria or guidelines for Ozempic at this time. The request for coverage for OZEMPIC INJ 8MG /3ML, use as directed (3 per month), is denied. This decision is based on health plan criteria for OZEMPIC INJ 8MG /3ML. This medicine is covered only if: One of the following: (1) Your doctor submits medical records (for example, chart notes) confirming diagnosis of type 2 diabetes mellitus as evidenced by one of the following laboratory values: (A) A1C greater than or equal to 6.5%.

## 2023-01-09 ENCOUNTER — Other Ambulatory Visit: Payer: Self-pay | Admitting: Internal Medicine

## 2023-01-12 ENCOUNTER — Other Ambulatory Visit: Payer: Self-pay | Admitting: Internal Medicine

## 2023-01-16 ENCOUNTER — Other Ambulatory Visit: Payer: Self-pay | Admitting: Gastroenterology

## 2023-01-16 ENCOUNTER — Other Ambulatory Visit: Payer: Self-pay | Admitting: Internal Medicine

## 2023-01-16 DIAGNOSIS — D128 Benign neoplasm of rectum: Secondary | ICD-10-CM

## 2023-01-16 DIAGNOSIS — Z1211 Encounter for screening for malignant neoplasm of colon: Secondary | ICD-10-CM

## 2023-01-16 DIAGNOSIS — R140 Abdominal distension (gaseous): Secondary | ICD-10-CM

## 2023-01-22 ENCOUNTER — Ambulatory Visit: Payer: 59

## 2023-01-27 ENCOUNTER — Ambulatory Visit (INDEPENDENT_AMBULATORY_CARE_PROVIDER_SITE_OTHER): Payer: 59 | Admitting: Family Medicine

## 2023-02-02 ENCOUNTER — Telehealth: Payer: Self-pay | Admitting: *Deleted

## 2023-02-02 NOTE — Telephone Encounter (Signed)
Received on (02/02/23) via of fax DME Standard Written Order from [facility] [place].  Requesting signature and return.  Given to provider to sign.//AB/CMA

## 2023-02-03 ENCOUNTER — Telehealth: Payer: Self-pay | Admitting: *Deleted

## 2023-02-03 NOTE — Telephone Encounter (Deleted)
Received on (02/02/2023) via of fax DME Standard Written Order from [facility] [place].  Requesting signature and return.  Given to provider to sign.    DME Standard Written Order signed and faxed back to [facility] [place].  Confirmation received and copy scanned into the chart.//AB/CMA

## 2023-02-03 NOTE — Telephone Encounter (Signed)
Entered in error.//AB/CMA 

## 2023-02-03 NOTE — Telephone Encounter (Signed)
DME Standard Written Order signed and faxed back to [facility] [place].  Confirmation received and copy scanned into the chart.//AB/CMA

## 2023-02-06 ENCOUNTER — Telehealth: Payer: Self-pay | Admitting: Internal Medicine

## 2023-02-06 NOTE — Telephone Encounter (Signed)
Patient called and said she was coughing up blood. She wanted to schedule an appointment. She was repeatedly advised to go to the ER or urgent care. She agreed, but still scheduled an appointment on 02/19/2023 to follow up with PCP.

## 2023-02-08 NOTE — Telephone Encounter (Signed)
I agree.  Graciana should see another available provider on an urgent basis.  Thanks

## 2023-02-09 ENCOUNTER — Ambulatory Visit: Payer: 59 | Admitting: Internal Medicine

## 2023-02-09 ENCOUNTER — Encounter: Payer: Self-pay | Admitting: Internal Medicine

## 2023-02-09 VITALS — BP 124/78 | HR 81 | Temp 98.3°F | Ht 63.0 in | Wt 242.0 lb

## 2023-02-09 DIAGNOSIS — H6992 Unspecified Eustachian tube disorder, left ear: Secondary | ICD-10-CM | POA: Diagnosis not present

## 2023-02-09 DIAGNOSIS — R6889 Other general symptoms and signs: Secondary | ICD-10-CM | POA: Diagnosis not present

## 2023-02-09 DIAGNOSIS — J4520 Mild intermittent asthma, uncomplicated: Secondary | ICD-10-CM | POA: Diagnosis not present

## 2023-02-09 DIAGNOSIS — E559 Vitamin D deficiency, unspecified: Secondary | ICD-10-CM

## 2023-02-09 DIAGNOSIS — I10 Essential (primary) hypertension: Secondary | ICD-10-CM

## 2023-02-09 DIAGNOSIS — R7309 Other abnormal glucose: Secondary | ICD-10-CM

## 2023-02-09 LAB — POC COVID19 BINAXNOW: SARS Coronavirus 2 Ag: NEGATIVE

## 2023-02-09 MED ORDER — PREDNISONE 10 MG PO TABS: ORAL_TABLET | ORAL | 0 refills | Status: DC

## 2023-02-09 MED ORDER — ALBUTEROL SULFATE HFA 108 (90 BASE) MCG/ACT IN AERS: 2.0000 | INHALATION_SPRAY | Freq: Four times a day (QID) | RESPIRATORY_TRACT | 0 refills | Status: DC | PRN

## 2023-02-09 MED ORDER — HYDROCODONE BIT-HOMATROP MBR 5-1.5 MG/5ML PO SOLN: 5.0000 mL | Freq: Four times a day (QID) | ORAL | 0 refills | Status: AC | PRN

## 2023-02-09 MED ORDER — AZITHROMYCIN 250 MG PO TABS: ORAL_TABLET | ORAL | 1 refills | Status: AC

## 2023-02-09 NOTE — Assessment & Plan Note (Signed)
Also for mucinex bid prn 

## 2023-02-09 NOTE — Progress Notes (Signed)
Patient ID: [name], female   DOB: Aug 30, 1970, 52 y.o.   MRN: [id number]        Chief Complaint: follow up asthmatic bronchitis, left ear pain, htn, hyperglycemia, low vit d       HPI:  [name] is a 52 y.o. female here with c/o Here with acute onset mild to mod 2-3 days ST, HA, general weakness and malaise, with prod cough greenish sputum, but Pt denies chest pain, increased sob or doe, wheezing, orthopnea, PND, increased LE swelling, palpitations, dizziness or syncope, except for mild wheezing sob doe since yestaerday.  Just [place] with lots of people exposure    left ear was pain, popping with landing in the plane yesterday       Wt Readings from Last 3 Encounters:  02/09/23 242 lb (109.8 kg)  12/22/22 233 lb (105.7 kg)  11/24/22 230 lb (104.3 kg)   BP Readings from Last 3 Encounters:  02/09/23 124/78  12/22/22 138/83  11/24/22 123/86         Past Medical History:  Diagnosis Date   Allergy    rhinitis   Anemia, unspecified    Anxiety    Asthma    asthma   Back pain    Colitis    Fibromyalgia    Hypertension    Joint pain    Lactose intolerance    Leiomyoma of uterus, unspecified    Meralgia paresthetica    Nausea alone    Obesity    Other B-complex deficiencies    Pre-diabetes    Stomach ulcer    Umbilical hernia    Unspecified vitamin D deficiency    Past Surgical History:  Procedure Laterality Date   BREAST REDUCTION SURGERY     fibriodidectomy  2009   MYOMECTOMY  2004   x 2    reports that she has never smoked. She has never been exposed to tobacco smoke. She has never used smokeless tobacco. She reports that she does not drink alcohol and does not use drugs. family history includes Alcoholism in her father; Diabetes in her paternal grandmother; High blood pressure in her father; Hypertension in her father and another family member. No Known Allergies Current Outpatient Medications on File Prior  to Visit  Medication Sig Dispense Refill   Albuterol-Budesonide (AIRSUPRA) 90-80 MCG/ACT AERO Inhale 2 Inhalations into the lungs every 4 (four) hours as needed. 10 g 5   ALPRAZolam (XANAX) 1 MG tablet TAKE 1 TABLET (1 MG TOTAL) BY MOUTH 3 TIMES/DAY AS NEEDED-BETWEEN MEALS & BEDTIME FOR ANXIETY. 90 tablet 1   Ascorbic Acid (VITAMIN C PO) Take by mouth.     chlorhexidine (HIBICLENS) 4 % external liquid Apply topically daily as needed. Use 3/week 1000 mL 3   Cholecalciferol 25 MCG (1000 UT) tablet Take 2,000 Units by mouth daily.     cyclobenzaprine (FLEXERIL) 10 MG tablet Take 1 tablet (10 mg total) by mouth at bedtime. 90 tablet 3   doxycycline (VIBRA-TABS) 100 MG tablet Take 1 tablet (100 mg total) by mouth 2 (two) times daily. 20 tablet 3   Doxylamine-Phenylephrine-APAP (VICKS SINEX NIGHTTIME PO) Take by mouth.     fluticasone (FLONASE) 50 MCG/ACT nasal spray SPRAY 2 SPRAYS INTO EACH NOSTRIL EVERY DAY 48 mL 0   hydrOXYzine (VISTARIL) 25 MG capsule Take 1 capsule (25 mg total) by mouth at bedtime. Annual appt due in August must see provider for future refills 90 capsule 0  hyoscyamine (LEVSIN SL) 0.125 MG SL tablet Place 1 tablet (0.125 mg total) under the tongue every 4 (four) hours as needed. 45 tablet 1   LINZESS 145 MCG CAPS capsule TAKE 1 CAPSULE BY MOUTH DAILY BEFORE BREAKFAST. 30 capsule 3   LINZESS 290 MCG CAPS capsule TAKE 1 CAPSULE BY MOUTH EVERY DAY BEFORE BREAKFAST 30 capsule 3   montelukast (SINGULAIR) 10 MG tablet TAKE 1 TABLET BY MOUTH EVERYDAY AT BEDTIME 90 tablet 1   mupirocin cream (BACTROBAN) 2 % Apply 1 application topically 2 (two) times daily. 30 g 3   naproxen (NAPROSYN) 500 MG tablet TAKE 1 TABLET BY MOUTH TWICE A DAY AS NEEDED FOR MODERATE PAIN OR HEADACHE 60 tablet 3   norethindrone-ethinyl estradiol (FYAVOLV) 1-5 MG-MCG TABS tablet Take by mouth daily.     Olmesartan-amLODIPine-HCTZ 40-5-12.5 MG TABS TAKE 1 TABLET BY MOUTH EVERY DAY 90 tablet 2   pantoprazole  (PROTONIX) 40 MG tablet TAKE 1 TABLET BY MOUTH EVERY DAY 90 tablet 1   Potassium Chloride ER 20 MEQ TBCR Take 1 tablet (20 mEq total) by mouth daily. Annual appt due in August must see provider for future refills 90 tablet 0   Prenatal Vit-Fe Fumarate-FA (M-NATAL PLUS) 27-1 MG TABS TAKE 1 TABLET BY MOUTH EVERY DAY 90 tablet 1   rizatriptan (MAXALT) 10 MG tablet TAKE 1 TABLET BY MOUTH ONCE AS NEEDED FOR UP TO 1 DOSE FOR MIGRAINE. MAY REPEAT IN 2 HOURS 12 tablet 5   Semaglutide, 2 MG/DOSE, 8 MG/3ML SOPN Inject 2 mg as directed once a week. 3 mL 1   triamcinolone (KENALOG) 0.1 % paste Use as directed 1 application. in the mouth or throat 3 (three) times daily. On canker sores 5 g 1   valACYclovir (VALTREX) 1000 MG tablet Take 1 tablet (1,000 mg total) by mouth 3 (three) times daily. 21 tablet 1   Vitamin D, Ergocalciferol, (DRISDOL) 1.25 MG (50000 UNIT) CAPS capsule Take 1 capsule (50,000 Units total) by mouth every 7 (seven) days. 5 capsule 0   No current facility-administered medications on file prior to visit.        ROS:  All others reviewed and negative.  Objective        PE:  BP 124/78 (BP Location: Left Arm, Patient Position: Sitting, Cuff Size: Normal)   Pulse 81   Temp 98.3 F (36.8 C) (Oral)   Ht 5\' 3"  (1.6 m)   Wt 242 lb (109.8 kg)   SpO2 100%   BMI 42.87 kg/m                 Constitutional: Pt appears in NAD, mild ill, non toxic               HENT: Head: NCAT.                Right Ear: External ear normal.                 Left Ear: External ear normal.  Bilat tm's with mild erythema.  Max sinus areas non tender.  Pharynx with mild erythema, no exudate                Eyes: . Pupils are equal, round, and reactive to light. Conjunctivae and EOM are normal               Nose: without d/c or deformity               Neck: Neck supple. Michaell Cowing  normal ROM               Cardiovascular: Normal rate and regular rhythm.                 Pulmonary/Chest: Effort normal and breath sounds  without rales or wheezing.                               Neurological: Pt is alert. At baseline orientation, motor grossly intact               Skin: Skin is warm. No rashes, no other new lesions, LE edema - none               Psychiatric: Pt behavior is normal without agitation   Micro: none  Cardiac tracings I have personally interpreted today:  none  Pertinent Radiological findings (summarize): none   Lab Results  Component Value Date   WBC 5.0 04/01/2022   HGB 13.5 04/01/2022   HCT 40.6 04/01/2022   PLT 164 04/01/2022   GLUCOSE 86 04/01/2022   CHOL 170 04/01/2022   TRIG 65 04/01/2022   HDL 55 04/01/2022   LDLCALC 102 (H) 04/01/2022   ALT 14 04/01/2022   AST 20 04/01/2022   NA 140 04/01/2022   K 4.0 04/01/2022   CL 100 04/01/2022   CREATININE 0.59 04/01/2022   BUN 7 04/01/2022   CO2 22 04/01/2022   TSH 0.529 04/01/2022   INR 0.9 RATIO 03/15/2007   HGBA1C 5.8 (H) 09/22/2022   POCT - COVID - neg  Assessment/Plan:  [name] is a 52 y.o. Black or African American [2] female with  has a past medical history of Allergy, Anemia, unspecified, Anxiety, Asthma, Back pain, Colitis, Fibromyalgia, Hypertension, Joint pain, Lactose intolerance, Leiomyoma of uterus, unspecified, Meralgia paresthetica, Nausea alone, Obesity, Other B-complex deficiencies, Pre-diabetes, Stomach ulcer, Umbilical hernia, and Unspecified vitamin D deficiency.  Acute dysfunction of left eustachian tube Also for mucinex bid prn  Asthmatic bronchitis Mild to mod, for antibx course levaquin, cough med prn, prednisone course and inhealer prn, declines cxr,  to f/u any worsening symptoms or concerns  Essential hypertension BP Readings from Last 3 Encounters:  02/09/23 124/78  12/22/22 138/83  11/24/22 123/86   Stable, pt to continue medical treatment tribenzor 50-12.5 - 5 qd   HYPERGLYCEMIA Lab Results  Component Value Date   HGBA1C 5.8 (H) 09/22/2022   Stable, pt to continue current  medical treatment  - diet, wt control   Vitamin D deficiency Last vitamin D Lab Results  Component Value Date   VD25OH 57.0 09/22/2022   Stable, cont oral replacement  Followup: Return if symptoms worsen or fail to improve.  Oliver Barre, MD 02/09/2023 7:43 PM [facility]

## 2023-02-09 NOTE — Assessment & Plan Note (Signed)
Last vitamin D Lab Results  Component Value Date   VD25OH 57.0 09/22/2022   Stable, cont oral replacement

## 2023-02-09 NOTE — Telephone Encounter (Signed)
Patient seeing Dr. Jonny Ruiz today for appointment.

## 2023-02-09 NOTE — Assessment & Plan Note (Signed)
Lab Results  Component Value Date   HGBA1C 5.8 (H) 09/22/2022   Stable, pt to continue current medical treatment  - diet, wt control

## 2023-02-09 NOTE — Patient Instructions (Addendum)
Your COVID test was negative  Please take all new medication as prescribed - the antibiotic, cough medicine, prednisone, and inhaler  Please continue all other medications as before, and refills have been done if requested.  Please have the pharmacy call with any other refills you may need.  Please keep your appointments with your specialists as you may have planned

## 2023-02-09 NOTE — Assessment & Plan Note (Signed)
BP Readings from Last 3 Encounters:  02/09/23 124/78  12/22/22 138/83  11/24/22 123/86   Stable, pt to continue medical treatment tribenzor 50-12.5 - 5 qd

## 2023-02-09 NOTE — Assessment & Plan Note (Signed)
Mild to mod, for antibx course levaquin, cough med prn, prednisone course and inhealer prn, declines cxr,  to f/u any worsening symptoms or concerns

## 2023-02-19 ENCOUNTER — Encounter: Payer: Self-pay | Admitting: Internal Medicine

## 2023-02-19 ENCOUNTER — Ambulatory Visit: Payer: 59 | Admitting: Internal Medicine

## 2023-02-19 VITALS — BP 120/82 | HR 92 | Temp 98.6°F | Ht 63.0 in | Wt 242.0 lb

## 2023-02-19 DIAGNOSIS — J4520 Mild intermittent asthma, uncomplicated: Secondary | ICD-10-CM

## 2023-02-19 DIAGNOSIS — R0609 Other forms of dyspnea: Secondary | ICD-10-CM | POA: Diagnosis not present

## 2023-02-19 DIAGNOSIS — R635 Abnormal weight gain: Secondary | ICD-10-CM | POA: Insufficient documentation

## 2023-02-19 DIAGNOSIS — E049 Nontoxic goiter, unspecified: Secondary | ICD-10-CM | POA: Insufficient documentation

## 2023-02-19 DIAGNOSIS — R739 Hyperglycemia, unspecified: Secondary | ICD-10-CM

## 2023-02-19 DIAGNOSIS — R7309 Other abnormal glucose: Secondary | ICD-10-CM

## 2023-02-19 MED ORDER — PSEUDOEPHEDRINE HCL ER 120 MG PO TB12: 120.0000 mg | ORAL_TABLET | Freq: Two times a day (BID) | ORAL | 2 refills | Status: DC | PRN

## 2023-02-19 MED ORDER — METHYLPREDNISOLONE 4 MG PO TBPK: ORAL_TABLET | ORAL | 0 refills | Status: DC

## 2023-02-19 NOTE — Progress Notes (Signed)
Subjective:  Patient ID: [name], female    DOB: [birth year]  Age: 52 y.o. MRN: [id number]  CC: Follow-up (Sob upon exertion, can't lay back due to feeling of choking, and weight gain due to unknown reason )   HPI [name] presents for SOB/DOE and wheezing Pt saw Dr Jonny Ruiz - cough, ST are much better She is still tired, has SOB w/walking and when laying down, crocked voice MDI helped  Outpatient Medications Prior to Visit  Medication Sig Dispense Refill   Albuterol-Budesonide (AIRSUPRA) 90-80 MCG/ACT AERO Inhale 2 Inhalations into the lungs every 4 (four) hours as needed. 10 g 5   ALPRAZolam (XANAX) 1 MG tablet TAKE 1 TABLET (1 MG TOTAL) BY MOUTH 3 TIMES/DAY AS NEEDED-BETWEEN MEALS & BEDTIME FOR ANXIETY. 90 tablet 1   Ascorbic Acid (VITAMIN C PO) Take by mouth.     chlorhexidine (HIBICLENS) 4 % external liquid Apply topically daily as needed. Use 3/week 1000 mL 3   Cholecalciferol 25 MCG (1000 UT) tablet Take 2,000 Units by mouth daily.     cyclobenzaprine (FLEXERIL) 10 MG tablet Take 1 tablet (10 mg total) by mouth at bedtime. 90 tablet 3   Doxylamine-Phenylephrine-APAP (VICKS SINEX NIGHTTIME PO) Take by mouth.     fluticasone (FLONASE) 50 MCG/ACT nasal spray SPRAY 2 SPRAYS INTO EACH NOSTRIL EVERY DAY 48 mL 0   HYDROcodone bit-homatropine (HYCODAN) 5-1.5 MG/5ML syrup Take 5 mLs by mouth every 6 (six) hours as needed for up to 10 days. 180 mL 0   hydrOXYzine (VISTARIL) 25 MG capsule Take 1 capsule (25 mg total) by mouth at bedtime. Annual appt due in August must see provider for future refills 90 capsule 0   hyoscyamine (LEVSIN SL) 0.125 MG SL tablet Place 1 tablet (0.125 mg total) under the tongue every 4 (four) hours as needed. 45 tablet 1   LINZESS 145 MCG CAPS capsule TAKE 1 CAPSULE BY MOUTH DAILY BEFORE BREAKFAST. 30 capsule 3   LINZESS 290 MCG CAPS capsule TAKE 1 CAPSULE BY MOUTH EVERY DAY BEFORE BREAKFAST 30 capsule 3   montelukast (SINGULAIR) 10 MG tablet TAKE 1  TABLET BY MOUTH EVERYDAY AT BEDTIME 90 tablet 1   mupirocin cream (BACTROBAN) 2 % Apply 1 application topically 2 (two) times daily. 30 g 3   naproxen (NAPROSYN) 500 MG tablet TAKE 1 TABLET BY MOUTH TWICE A DAY AS NEEDED FOR MODERATE PAIN OR HEADACHE 60 tablet 3   norethindrone-ethinyl estradiol (FYAVOLV) 1-5 MG-MCG TABS tablet Take by mouth daily.     Olmesartan-amLODIPine-HCTZ 40-5-12.5 MG TABS TAKE 1 TABLET BY MOUTH EVERY DAY 90 tablet 2   pantoprazole (PROTONIX) 40 MG tablet TAKE 1 TABLET BY MOUTH EVERY DAY 90 tablet 1   Potassium Chloride ER 20 MEQ TBCR Take 1 tablet (20 mEq total) by mouth daily. Annual appt due in August must see provider for future refills 90 tablet 0   Prenatal Vit-Fe Fumarate-FA (M-NATAL PLUS) 27-1 MG TABS TAKE 1 TABLET BY MOUTH EVERY DAY 90 tablet 1   rizatriptan (MAXALT) 10 MG tablet TAKE 1 TABLET BY MOUTH ONCE AS NEEDED FOR UP TO 1 DOSE FOR MIGRAINE. MAY REPEAT IN 2 HOURS 12 tablet 5   Semaglutide, 2 MG/DOSE, 8 MG/3ML SOPN Inject 2 mg as directed once a week. 3 mL 1   triamcinolone (KENALOG) 0.1 % paste Use as directed 1 application. in the mouth or throat 3 (three) times daily. On canker sores 5 g 1   valACYclovir (VALTREX) 1000 MG tablet Take  1 tablet (1,000 mg total) by mouth 3 (three) times daily. 21 tablet 1   Vitamin D, Ergocalciferol, (DRISDOL) 1.25 MG (50000 UNIT) CAPS capsule Take 1 capsule (50,000 Units total) by mouth every 7 (seven) days. 5 capsule 0   albuterol (VENTOLIN HFA) 108 (90 Base) MCG/ACT inhaler Inhale 2 puffs into the lungs every 6 (six) hours as needed for wheezing or shortness of breath. 8 g 0   predniSONE (DELTASONE) 10 MG tablet 3 tabs by mouth per day for 3 days,2tabs per day for 3 days,1tab per day for 3 days 18 tablet 0   doxycycline (VIBRA-TABS) 100 MG tablet Take 1 tablet (100 mg total) by mouth 2 (two) times daily. (Patient not taking: Reported on 02/19/2023) 20 tablet 3   No facility-administered medications prior to visit.     ROS: Review of Systems  Constitutional:  Positive for fatigue and unexpected weight change. Negative for activity change, appetite change, chills, diaphoresis and fever.  HENT:  Positive for voice change. Negative for congestion, ear discharge, mouth sores, sinus pressure, sore throat and trouble swallowing.   Eyes:  Negative for visual disturbance.  Respiratory:  Positive for cough, shortness of breath and wheezing. Negative for choking, chest tightness and stridor.   Cardiovascular:  Negative for leg swelling.  Gastrointestinal:  Negative for abdominal pain and nausea.  Genitourinary:  Negative for difficulty urinating, frequency and vaginal pain.  Musculoskeletal:  Negative for back pain and gait problem.  Skin:  Negative for pallor and rash.  Neurological:  Negative for dizziness, tremors, weakness, numbness and headaches.  Psychiatric/Behavioral:  Negative for confusion and sleep disturbance.     Objective:  BP 120/82 (BP Location: Left Arm, Patient Position: Sitting, Cuff Size: Large)   Pulse 92   Temp 98.6 F (37 C) (Oral)   Ht 5\' 3"  (1.6 m)   Wt 242 lb (109.8 kg)   SpO2 94%   BMI 42.87 kg/m   BP Readings from Last 3 Encounters:  02/19/23 120/82  02/09/23 124/78  12/22/22 138/83    Wt Readings from Last 3 Encounters:  02/19/23 242 lb (109.8 kg)  02/09/23 242 lb (109.8 kg)  12/22/22 233 lb (105.7 kg)    Physical Exam Constitutional:      General: She is not in acute distress.    Appearance: She is well-developed. She is obese. She is not toxic-appearing.  HENT:     Head: Normocephalic.     Right Ear: External ear normal.     Left Ear: External ear normal.     Nose: Nose normal.  Eyes:     General:        Right eye: No discharge.        Left eye: No discharge.     Conjunctiva/sclera: Conjunctivae normal.     Pupils: Pupils are equal, round, and reactive to light.  Neck:     Thyroid: No thyromegaly.     Vascular: No JVD.     Trachea: No tracheal  deviation.  Cardiovascular:     Rate and Rhythm: Normal rate and regular rhythm.     Heart sounds: Normal heart sounds.  Pulmonary:     Effort: No respiratory distress.     Breath sounds: No stridor. No wheezing.  Abdominal:     General: Bowel sounds are normal. There is no distension.     Palpations: Abdomen is soft. There is no mass.     Tenderness: There is no abdominal tenderness. There is no guarding or rebound.  Musculoskeletal:        General: No tenderness.     Cervical back: Normal range of motion and neck supple. No rigidity.  Lymphadenopathy:     Cervical: No cervical adenopathy.  Skin:    Findings: No erythema or rash.  Neurological:     Cranial Nerves: No cranial nerve deficit.     Motor: No abnormal muscle tone.     Coordination: Coordination normal.     Deep Tendon Reflexes: Reflexes normal.  Psychiatric:        Behavior: Behavior normal.        Thought Content: Thought content normal.        Judgment: Judgment normal.    Hoarse Goiter Erythematous throat Lab Results  Component Value Date   WBC 5.0 04/01/2022   HGB 13.5 04/01/2022   HCT 40.6 04/01/2022   PLT 164 04/01/2022   GLUCOSE 86 04/01/2022   CHOL 170 04/01/2022   TRIG 65 04/01/2022   HDL 55 04/01/2022   LDLCALC 102 (H) 04/01/2022   ALT 14 04/01/2022   AST 20 04/01/2022   NA 140 04/01/2022   K 4.0 04/01/2022   CL 100 04/01/2022   CREATININE 0.59 04/01/2022   BUN 7 04/01/2022   CO2 22 04/01/2022   TSH 0.529 04/01/2022   INR 0.9 RATIO 03/15/2007   HGBA1C 5.8 (H) 09/22/2022    CT Abdomen Pelvis W Contrast  Result Date: 01/13/2022 CLINICAL DATA:  Severe abdominal bloating, upper abdominal mass. Bowel obstruction. EXAM: CT ABDOMEN AND PELVIS WITH CONTRAST TECHNIQUE: Multidetector CT imaging of the abdomen and pelvis was performed using the standard protocol following bolus administration of intravenous contrast. RADIATION DOSE REDUCTION: This exam was performed according to the departmental  dose-optimization program which includes automated exposure control, adjustment of the mA and/or kV according to patient size and/or use of iterative reconstruction technique. CONTRAST:  OMNIPAQUE IOHEXOL 300 MG/ML  SOLN COMPARISON:  None Available. FINDINGS: Lower chest: No acute abnormality. Hepatobiliary: No focal liver abnormality is seen. No gallstones, gallbladder wall thickening, or biliary dilatation. Pancreas: Unremarkable Spleen: Unremarkable Adrenals/Urinary Tract: Stable 2.2 cm right adrenal adenoma. Stable 13 mm more posterior right adrenal nodule, previously characterized as a lipid poor adenoma. Left adrenal gland is unremarkable. The kidneys are normal in size and position. Simple cortical cyst noted within the lower pole the left kidney. No follow-up imaging is recommended for this lesion. The kidneys are otherwise unremarkable. Bladder unremarkable. Stomach/Bowel: Mild sigmoid diverticulosis. A small umbilical hernia contains a single unremarkable loop of small bowel. The stomach, small bowel, and large bowel are otherwise unremarkable. No evidence of obstruction or focal inflammation. No free intraperitoneal gas or fluid. Appendix normal. Vascular/Lymphatic: Circumaortic left renal vein. The abdominal vasculature is otherwise unremarkable. No pathologic adenopathy within the abdomen and pelvis. Reproductive: The uterus is lobulated and demonstrates heterogeneous enhancement with scattered coarse calcifications in keeping with multiple underlying uterine fibroids. The pelvic organs are otherwise unremarkable. Other: None Musculoskeletal: Degenerative changes seen within the lumbar spine. No acute bone abnormality. No lytic or blastic bone lesion. IMPRESSION: No acute intra-abdominal pathology identified. No bowel obstruction. No definite radiographic explanation for the patient's reported symptoms. Stable 2.2 cm benign right adrenal adenoma. Stable 1.3 cm more posterior right adrenal nodule,  previously characterized as a lipid poor adenoma. Mild sigmoid diverticulosis without superimposed acute inflammatory change. Fibroid uterus. Small umbilical hernia containing and unremarkable loop of small bowel, similar to prior examination. Electronically Signed   By: Lyda Kalata.D.  On: 01/13/2022 02:02    Assessment & Plan:   Problem List Items Addressed This Visit     Asthmatic bronchitis    Continue with Airsupra 2 puffs 4 times a day Medrol DosePack Sudafed 12-hour 1 twice daily as needed congestion      Relevant Medications   methylPREDNISolone (MEDROL DOSEPAK) 4 MG TBPK tablet   Dyspnea - Primary    Ongoing.  Rule out goiter - obtain thyroid ultrasound.  No signs of stridor, tonsillar abscess etc. Obtain chest x-ray Continue with Airsupra 2 puffs 4 times a day Medrol DosePack Sudafed 12-hour 1 twice daily as needed congestion      Relevant Orders   DG Chest 2 View   HYPERGLYCEMIA   Relevant Orders   Comprehensive metabolic panel   Goiter    Palpable. Rule out goiter - obtain thyroid ultrasound.  No signs of stridor, tonsillar abscess etc. Continue with Airsupra 2 puffs 4 times a day Medrol DosePack Sudafed 12-hour 1 twice daily as needed congestion      Relevant Orders   US THYROID   Hyperglycemia    Obtain c-Met      Weight gain    We discussed issues related to weight gain, i.e. dyspnea on exertion         Meds ordered this encounter  Medications   pseudoephedrine (SUDAFED) 120 MG 12 hr tablet    Sig: Take 1 tablet (120 mg total) by mouth 2 (two) times daily as needed for congestion.    Dispense:  20 tablet    Refill:  2   methylPREDNISolone (MEDROL DOSEPAK) 4 MG TBPK tablet    Sig: As directed    Dispense:  21 tablet    Refill:  0      Follow-up: Return in about 3 months (around 05/22/2023) for a follow-up visit.  Sonda Primes, MD

## 2023-02-19 NOTE — Assessment & Plan Note (Signed)
Obtain c-Met 

## 2023-02-19 NOTE — Assessment & Plan Note (Signed)
Continue with Airsupra 2 puffs 4 times a day Medrol DosePack Sudafed 12-hour 1 twice daily as needed congestion

## 2023-02-19 NOTE — Assessment & Plan Note (Signed)
We discussed issues related to weight gain, i.e. dyspnea on exertion

## 2023-02-19 NOTE — Assessment & Plan Note (Signed)
Palpable. Rule out goiter - obtain thyroid ultrasound.  No signs of stridor, tonsillar abscess etc. Continue with Airsupra 2 puffs 4 times a day Medrol DosePack Sudafed 12-hour 1 twice daily as needed congestion

## 2023-02-19 NOTE — Assessment & Plan Note (Signed)
Ongoing.  Rule out goiter - obtain thyroid ultrasound.  No signs of stridor, tonsillar abscess etc. Obtain chest x-ray Continue with Airsupra 2 puffs 4 times a day Medrol DosePack Sudafed 12-hour 1 twice daily as needed congestion

## 2023-02-23 ENCOUNTER — Other Ambulatory Visit (INDEPENDENT_AMBULATORY_CARE_PROVIDER_SITE_OTHER): Payer: 59

## 2023-02-23 DIAGNOSIS — R7309 Other abnormal glucose: Secondary | ICD-10-CM

## 2023-02-23 LAB — COMPREHENSIVE METABOLIC PANEL WITH GFR
ALT: 22 U/L (ref 0–35)
AST: 22 U/L (ref 0–37)
Albumin: 4.1 g/dL (ref 3.5–5.2)
Alkaline Phosphatase: 56 U/L (ref 39–117)
BUN: 14 mg/dL (ref 6–23)
CO2: 24 meq/L (ref 19–32)
Calcium: 9.6 mg/dL (ref 8.4–10.5)
Chloride: 104 meq/L (ref 96–112)
Creatinine, Ser: 0.72 mg/dL (ref 0.40–1.20)
GFR: 95.71 mL/min
Glucose, Bld: 160 mg/dL — ABNORMAL HIGH (ref 70–99)
Potassium: 3.1 meq/L — ABNORMAL LOW (ref 3.5–5.1)
Sodium: 142 meq/L (ref 135–145)
Total Bilirubin: 0.3 mg/dL (ref 0.2–1.2)
Total Protein: 7.5 g/dL (ref 6.0–8.3)

## 2023-02-24 ENCOUNTER — Encounter: Payer: Self-pay | Admitting: Internal Medicine

## 2023-02-24 ENCOUNTER — Ambulatory Visit
Admission: RE | Admit: 2023-02-24 | Discharge: 2023-02-24 | Disposition: A | Discharge status: Home / Self Care | Payer: 59 | Source: Ambulatory Visit | Attending: Internal Medicine | Admitting: Internal Medicine

## 2023-02-24 DIAGNOSIS — E049 Nontoxic goiter, unspecified: Secondary | ICD-10-CM

## 2023-02-24 DIAGNOSIS — E1165 Type 2 diabetes mellitus with hyperglycemia: Secondary | ICD-10-CM | POA: Insufficient documentation

## 2023-02-24 MED ORDER — POTASSIUM CHLORIDE CRYS ER 20 MEQ PO TBCR: 20.0000 meq | EXTENDED_RELEASE_TABLET | Freq: Two times a day (BID) | ORAL | 0 refills | Status: DC

## 2023-02-24 MED ORDER — TIRZEPATIDE 2.5 MG/0.5ML ~~LOC~~ SOAJ: 2.5000 mg | SUBCUTANEOUS | 2 refills | Status: DC

## 2023-02-25 ENCOUNTER — Encounter: Payer: Self-pay | Admitting: Internal Medicine

## 2023-02-25 ENCOUNTER — Telehealth: Payer: Self-pay | Admitting: *Deleted

## 2023-02-25 NOTE — Telephone Encounter (Signed)
Pt is needing a PA on Mounjaro. Submitted w/  (Key: [id number]) [facility] is reviewing your PA request../l,mb

## 2023-02-26 ENCOUNTER — Encounter: Payer: Self-pay | Admitting: Internal Medicine

## 2023-02-26 ENCOUNTER — Other Ambulatory Visit: Payer: Self-pay | Admitting: Internal Medicine

## 2023-02-26 DIAGNOSIS — E049 Nontoxic goiter, unspecified: Secondary | ICD-10-CM

## 2023-02-26 NOTE — Telephone Encounter (Signed)
Check status on PA.. rec'd msg " Request Reference Number: [id number]. MOUNJARO INJ 2.5/0.5 is denied for not meeting the prior authorization requirement(s)...Raechel Chute

## 2023-02-27 NOTE — Telephone Encounter (Signed)
It is for type 2 diabetes - new dx. Why was it denied? Thx

## 2023-02-27 NOTE — Telephone Encounter (Signed)
Called [facility] [phone] spoke to advocate she was able to pull up PA. She states even though the pt have a diagnosis for Type 2 diabetes mellitus with hyperglycemia, her A1C was normal at 5.8. Test must be at least 6.5.Marland KitchenRaechel Chute

## 2023-03-01 NOTE — Telephone Encounter (Signed)
Her fasting glucose was 160 which qualifies her for type 2 diabetes diagnosis according to ADA -American diabetes Association. Thank you

## 2023-03-08 ENCOUNTER — Other Ambulatory Visit: Payer: Self-pay | Admitting: Internal Medicine

## 2023-03-15 ENCOUNTER — Encounter: Payer: Self-pay | Admitting: Internal Medicine

## 2023-03-17 ENCOUNTER — Other Ambulatory Visit: Payer: Self-pay | Admitting: Internal Medicine

## 2023-03-17 DIAGNOSIS — R5382 Chronic fatigue, unspecified: Secondary | ICD-10-CM

## 2023-03-17 DIAGNOSIS — R739 Hyperglycemia, unspecified: Secondary | ICD-10-CM

## 2023-03-18 ENCOUNTER — Other Ambulatory Visit: Payer: Self-pay | Admitting: Internal Medicine

## 2023-03-19 ENCOUNTER — Other Ambulatory Visit: Payer: 59

## 2023-03-20 ENCOUNTER — Other Ambulatory Visit (INDEPENDENT_AMBULATORY_CARE_PROVIDER_SITE_OTHER): Payer: 59

## 2023-03-20 DIAGNOSIS — R739 Hyperglycemia, unspecified: Secondary | ICD-10-CM

## 2023-03-20 DIAGNOSIS — R5382 Chronic fatigue, unspecified: Secondary | ICD-10-CM | POA: Diagnosis not present

## 2023-03-20 LAB — CBC WITH DIFFERENTIAL/PLATELET
Basophils Absolute: 0 10*3/uL (ref 0.0–0.1)
Basophils Relative: 0.4 % (ref 0.0–3.0)
Eosinophils Absolute: 0.1 10*3/uL (ref 0.0–0.7)
Eosinophils Relative: 2.6 % (ref 0.0–5.0)
HCT: 40.1 % (ref 36.0–46.0)
Hemoglobin: 12.8 g/dL (ref 12.0–15.0)
Lymphocytes Relative: 32.4 % (ref 12.0–46.0)
Lymphs Abs: 1.4 10*3/uL (ref 0.7–4.0)
MCHC: 32 g/dL (ref 30.0–36.0)
MCV: 82.5 fl (ref 78.0–100.0)
Monocytes Absolute: 0.4 10*3/uL (ref 0.1–1.0)
Monocytes Relative: 8.2 % (ref 3.0–12.0)
Neutro Abs: 2.4 10*3/uL (ref 1.4–7.7)
Neutrophils Relative %: 56.4 % (ref 43.0–77.0)
Platelets: 157 10*3/uL (ref 150.0–400.0)
RBC: 4.85 Mil/uL (ref 3.87–5.11)
RDW: 15 % (ref 11.5–15.5)
WBC: 4.3 10*3/uL (ref 4.0–10.5)

## 2023-03-20 LAB — BASIC METABOLIC PANEL
BUN: 11 mg/dL (ref 6–23)
CO2: 23 mEq/L (ref 19–32)
Calcium: 9.5 mg/dL (ref 8.4–10.5)
Chloride: 101 mEq/L (ref 96–112)
Creatinine, Ser: 0.59 mg/dL (ref 0.40–1.20)
GFR: 103.12 mL/min (ref >60.00)
Glucose, Bld: 101 mg/dL — ABNORMAL HIGH (ref 70–99)
Potassium: 3.6 mEq/L (ref 3.5–5.1)
Sodium: 135 mEq/L (ref 135–145)

## 2023-03-20 LAB — T4, FREE: Free T4: 0.77 ng/dL (ref 0.60–1.60)

## 2023-03-20 LAB — T3, FREE: T3, Free: 3.8 pg/mL (ref 2.3–4.2)

## 2023-03-20 LAB — TSH: TSH: 0.63 u[IU]/mL (ref 0.35–5.50)

## 2023-03-24 ENCOUNTER — Other Ambulatory Visit (HOSPITAL_COMMUNITY)
Admission: RE | Admit: 2023-03-24 | Discharge: 2023-03-24 | Disposition: A | Discharge status: Home / Self Care | Payer: 59 | Source: Ambulatory Visit | Attending: Internal Medicine | Admitting: Internal Medicine

## 2023-03-24 ENCOUNTER — Ambulatory Visit
Admission: RE | Admit: 2023-03-24 | Discharge: 2023-03-24 | Disposition: A | Discharge status: Home / Self Care | Payer: 59 | Source: Ambulatory Visit | Attending: Internal Medicine | Admitting: Internal Medicine

## 2023-03-24 DIAGNOSIS — E049 Nontoxic goiter, unspecified: Secondary | ICD-10-CM

## 2023-03-26 LAB — CYTOLOGY - NON PAP

## 2023-04-01 ENCOUNTER — Encounter: Payer: Self-pay | Admitting: Internal Medicine

## 2023-04-01 ENCOUNTER — Ambulatory Visit: Payer: 59 | Admitting: Internal Medicine

## 2023-04-01 VITALS — BP 118/80 | HR 90 | Temp 98.6°F | Ht 63.0 in | Wt 244.0 lb

## 2023-04-01 DIAGNOSIS — M545 Low back pain, unspecified: Secondary | ICD-10-CM | POA: Diagnosis not present

## 2023-04-01 DIAGNOSIS — G8929 Other chronic pain: Secondary | ICD-10-CM | POA: Diagnosis not present

## 2023-04-01 DIAGNOSIS — R635 Abnormal weight gain: Secondary | ICD-10-CM | POA: Diagnosis not present

## 2023-04-01 DIAGNOSIS — E049 Nontoxic goiter, unspecified: Secondary | ICD-10-CM | POA: Diagnosis not present

## 2023-04-01 MED ORDER — NAPROXEN 500 MG PO TABS: 500.0000 mg | ORAL_TABLET | Freq: Two times a day (BID) | ORAL | 3 refills | Status: DC

## 2023-04-01 MED ORDER — PHENTERMINE HCL 37.5 MG PO TABS: 37.5000 mg | ORAL_TABLET | Freq: Every day | ORAL | 2 refills | Status: DC

## 2023-04-01 NOTE — Progress Notes (Signed)
Subjective:  Patient ID: [name], female    DOB: May 22, 1971  Age: 52 y.o. MRN: [id number]  CC: Follow-up   HPI [name] presents for a thyroid FNA - Bethesda III  C/o wt gain, LBP, fatigue, choking in her sleep C/o LBP irrad to the R>L leg   MRI 2021 LS:  IMPRESSION: 1. 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 Superimposed broad posterior disc bulge at this level with resultant mild to moderate left worse than right L5 foraminal stenosis. 2. Disc bulge with small left subarticular to foraminal disc protrusion at L2-3 with resultant mild left foraminal and left lateral recess stenosis. 3. Progressive degenerative disc disease and disc bulge at L1-2 with resultant mild right L1 foraminal stenosis. 4. Transitional lumbosacral anatomy. Careful correlation with numbering system on this exam recommended prior to any potential future intervention. 5. Enlarged fibroid uterus.     Electronically Signed   By: Rise Mu M.D.   On: 05/14/2020 04:15   Outpatient Medications Prior to Visit  Medication Sig Dispense Refill   Albuterol-Budesonide (AIRSUPRA) 90-80 MCG/ACT AERO Inhale 2 Inhalations into the lungs every 4 (four) hours as needed. 10 g 5   ALPRAZolam (XANAX) 1 MG tablet TAKE 1 TABLET (1 MG TOTAL) BY MOUTH 3 TIMES/DAY AS NEEDED-BETWEEN MEALS & BEDTIME FOR ANXIETY. 90 tablet 1   Ascorbic Acid (VITAMIN C PO) Take by mouth.     chlorhexidine (HIBICLENS) 4 % external liquid Apply topically daily as needed. Use 3/week 1000 mL 3   Cholecalciferol 25 MCG (1000 UT) tablet Take 2,000 Units by mouth daily.     cyclobenzaprine (FLEXERIL) 10 MG tablet Take 1 tablet (10 mg total) by mouth at bedtime. 90 tablet 3   Doxylamine-Phenylephrine-APAP (VICKS SINEX NIGHTTIME PO) Take by mouth.     fluticasone (FLONASE) 50 MCG/ACT nasal spray SPRAY 2 SPRAYS INTO EACH NOSTRIL  EVERY DAY 48 mL 0   hydrOXYzine (VISTARIL) 25 MG capsule Take 1 capsule (25 mg total) by mouth at bedtime. Annual appt due in August must see provider for future refills 90 capsule 0   hyoscyamine (LEVSIN SL) 0.125 MG SL tablet Place 1 tablet (0.125 mg total) under the tongue every 4 (four) hours as needed. 45 tablet 1   KLOR-CON M20 20 MEQ tablet TAKE 1 TABLET BY MOUTH TWICE A DAY 180 tablet 1   LINZESS 145 MCG CAPS capsule TAKE 1 CAPSULE BY MOUTH DAILY BEFORE BREAKFAST. 30 capsule 3   LINZESS 290 MCG CAPS capsule TAKE 1 CAPSULE BY MOUTH EVERY DAY BEFORE BREAKFAST 30 capsule 3   methylPREDNISolone (MEDROL DOSEPAK) 4 MG TBPK tablet As directed 21 tablet 0   montelukast (SINGULAIR) 10 MG tablet TAKE 1 TABLET BY MOUTH EVERYDAY AT BEDTIME 90 tablet 1   mupirocin cream (BACTROBAN) 2 % Apply 1 application topically 2 (two) times daily. 30 g 3   norethindrone-ethinyl estradiol (FYAVOLV) 1-5 MG-MCG TABS tablet Take by mouth daily.     Olmesartan-amLODIPine-HCTZ 40-5-12.5 MG TABS TAKE 1 TABLET BY MOUTH EVERY DAY 90 tablet 2   pantoprazole (PROTONIX) 40 MG tablet TAKE 1 TABLET BY MOUTH EVERY DAY 90 tablet 1   Potassium Chloride ER 20 MEQ TBCR Take 1 tablet (20 mEq total) by mouth daily. Annual appt due in August must see provider for future refills 90 tablet 0   Prenatal Vit-Fe Fumarate-FA (M-NATAL PLUS) 27-1 MG TABS TAKE 1 TABLET BY MOUTH EVERY DAY  90 tablet 1   pseudoephedrine (SUDAFED) 120 MG 12 hr tablet Take 1 tablet (120 mg total) by mouth 2 (two) times daily as needed for congestion. 20 tablet 2   rizatriptan (MAXALT) 10 MG tablet TAKE 1 TABLET BY MOUTH ONCE AS NEEDED FOR UP TO 1 DOSE FOR MIGRAINE. MAY REPEAT IN 2 HOURS 12 tablet 5   Semaglutide, 2 MG/DOSE, 8 MG/3ML SOPN Inject 2 mg as directed once a week. 3 mL 1   tirzepatide (MOUNJARO) 2.5 MG/0.5ML Pen Inject 2.5 mg into the skin once a week. 2 mL 2   triamcinolone (KENALOG) 0.1 % paste Use as directed 1 application. in the mouth or throat 3  (three) times daily. On canker sores 5 g 1   valACYclovir (VALTREX) 1000 MG tablet Take 1 tablet (1,000 mg total) by mouth 3 (three) times daily. 21 tablet 1   Vitamin D, Ergocalciferol, (DRISDOL) 1.25 MG (50000 UNIT) CAPS capsule Take 1 capsule (50,000 Units total) by mouth every 7 (seven) days. 5 capsule 0   naproxen (NAPROSYN) 500 MG tablet TAKE 1 TABLET BY MOUTH TWICE A DAY AS NEEDED FOR MODERATE PAIN OR HEADACHE 60 tablet 3   doxycycline (VIBRA-TABS) 100 MG tablet Take 1 tablet (100 mg total) by mouth 2 (two) times daily. (Patient not taking: Reported on 02/19/2023) 20 tablet 3   No facility-administered medications prior to visit.    ROS: Review of Systems  Constitutional:  Positive for fatigue and unexpected weight change. Negative for activity change, appetite change and chills.  HENT:  Negative for congestion, mouth sores and sinus pressure.   Eyes:  Negative for visual disturbance.  Respiratory:  Negative for cough and chest tightness.   Gastrointestinal:  Negative for abdominal pain and nausea.  Genitourinary:  Negative for difficulty urinating, frequency and vaginal pain.  Musculoskeletal:  Positive for back pain and gait problem.  Skin:  Negative for pallor and rash.  Neurological:  Positive for weakness. Negative for dizziness, tremors, numbness and headaches.  Psychiatric/Behavioral:  Negative for confusion and sleep disturbance.     Objective:  BP 118/80 (BP Location: Right Arm, Patient Position: Sitting, Cuff Size: Normal)   Pulse 90   Temp 98.6 F (37 C) (Oral)   Ht 5\' 3"  (1.6 m)   Wt 244 lb (110.7 kg)   SpO2 92%   BMI 43.22 kg/m   BP Readings from Last 3 Encounters:  04/01/23 118/80  02/19/23 120/82  02/09/23 124/78    Wt Readings from Last 3 Encounters:  04/01/23 244 lb (110.7 kg)  02/19/23 242 lb (109.8 kg)  02/09/23 242 lb (109.8 kg)    Physical Exam  Lab Results  Component Value Date   WBC 4.3 03/20/2023   HGB 12.8 03/20/2023   HCT 40.1  03/20/2023   PLT 157.0 03/20/2023   GLUCOSE 101 (H) 03/20/2023   CHOL 170 04/01/2022   TRIG 65 04/01/2022   HDL 55 04/01/2022   LDLCALC 102 (H) 04/01/2022   ALT 22 02/23/2023   AST 22 02/23/2023   NA 135 03/20/2023   K 3.6 03/20/2023   CL 101 03/20/2023   CREATININE 0.59 03/20/2023   BUN 11 03/20/2023   CO2 23 03/20/2023   TSH 0.63 03/20/2023   INR 0.9 RATIO 03/15/2007   HGBA1C 5.8 (H) 09/22/2022    [place] FNA BX THYROID 1ST LESION AFIRMA  Result Date: 03/24/2023 INDICATION: Indeterminate thyroid nodule EXAM: ULTRASOUND GUIDED FINE NEEDLE ASPIRATION OF INDETERMINATE THYROID NODULE COMPARISON:  February 24, 2023 MEDICATIONS: None  COMPLICATIONS: None immediate. TECHNIQUE: Informed written consent was obtained from the patient after a discussion of the risks, benefits and alternatives to treatment. Questions regarding the procedure were encouraged and answered. A timeout was performed prior to the initiation of the procedure. Pre-procedural ultrasound scanning demonstrated unchanged size and appearance of the indeterminate nodule within the left thyroid lobe. The procedure was planned. The neck was prepped in the usual sterile fashion, and a sterile drape was applied covering the operative field. A timeout was performed prior to the initiation of the procedure. Local anesthesia was provided with 1% lidocaine. Under direct ultrasound guidance, x5 FNA biopsies were performed of the nodule in the left thyroid lobe with a 25 gauge needle. Multiple ultrasound images were saved for procedural documentation purposes. The samples were prepared and submitted to pathology. Limited post procedural scanning was negative for hematoma or additional complication. Dressings were placed. The patient tolerated the above procedures procedure well without immediate postprocedural complication. FINDINGS: Nodule reference number based on prior diagnostic ultrasound: 6 Maximum size: 3.6 cm Location: Left; Mid ACR TI-RADS risk  category: TR4 (4-6 points) Reason for biopsy: meets ACR TI-RADS criteria Ultrasound imaging confirms appropriate placement of the needles within the thyroid nodule. IMPRESSION: Technically successful ultrasound guided fine needle aspiration of thyroid nodule in the left thyroid lobe. Electronically Signed   By: Olive Bass M.D.   On: 03/24/2023 15:45    Assessment & Plan:   Problem List Items Addressed This Visit     Low back pain    Worse  MRI 2021 LS:  IMPRESSION: 1. 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 Superimposed broad posterior disc bulge at this level with resultant mild to moderate left worse than right L5 foraminal stenosis. 2. Disc bulge with small left subarticular to foraminal disc protrusion at L2-3 with resultant mild left foraminal and left lateral recess stenosis. 3. Progressive degenerative disc disease and disc bulge at L1-2 with resultant mild right L1 foraminal stenosis. 4. Transitional lumbosacral anatomy. Careful correlation with numbering system on this exam recommended prior to any potential future intervention. 5. Enlarged fibroid uterus.     Electronically Signed   By: Rise Mu M.D.   On: 05/14/2020 04:15      Relevant Medications   naproxen (NAPROSYN) 500 MG tablet   Goiter, nodular    ENT ref - large goiter Bethesda III, malignancy risk is <4%      Weight gain    Phentermine po  Potential benefits of a short/long term phentermine  use as well as potential risks  and complications were explained to the patient and were aknowledged.       Other Visit Diagnoses     Goiter    -  Primary   Relevant Orders   Ambulatory referral to ENT         Meds ordered this encounter  Medications   phentermine (ADIPEX-P) 37.5 MG tablet    Sig: Take 1 tablet (37.5 mg total) by mouth daily before breakfast.    Dispense:  30 tablet     Refill:  2   naproxen (NAPROSYN) 500 MG tablet    Sig: Take 1 tablet (500 mg total) by mouth 2 (two) times daily with a meal.    Dispense:  60 tablet    Refill:  3    PATIENT REQUESTING A NEW RX      Follow-up: No follow-ups on file.  Sonda Primes, MD

## 2023-04-01 NOTE — Assessment & Plan Note (Addendum)
ENT ref - large goiter Bethesda III, malignancy risk is <4%

## 2023-04-01 NOTE — Assessment & Plan Note (Signed)
Worse  MRI 2021 LS:  IMPRESSION: 1. 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 Superimposed broad posterior disc bulge at this level with resultant mild to moderate left worse than right L5 foraminal stenosis. 2. Disc bulge with small left subarticular to foraminal disc protrusion at L2-3 with resultant mild left foraminal and left lateral recess stenosis. 3. Progressive degenerative disc disease and disc bulge at L1-2 with resultant mild right L1 foraminal stenosis. 4. Transitional lumbosacral anatomy. Careful correlation with numbering system on this exam recommended prior to any potential future intervention. 5. Enlarged fibroid uterus.     Electronically Signed   By: Rise Mu M.D.   On: 05/14/2020 04:15

## 2023-04-07 ENCOUNTER — Encounter: Payer: Self-pay | Admitting: Specialist

## 2023-04-08 ENCOUNTER — Telehealth: Payer: Self-pay | Admitting: Internal Medicine

## 2023-04-08 ENCOUNTER — Encounter (HOSPITAL_COMMUNITY): Payer: Self-pay

## 2023-04-08 NOTE — Telephone Encounter (Signed)
Patient said she understands that the nodule is benign. She said it is large and affecting her breathing. She wants to know if she needs to still see the endocrinologist or if she can get the referral for Dr. Gerrit Friends.  Patient would like a call back at [phone].

## 2023-04-08 NOTE — Telephone Encounter (Signed)
PT IS CALLING ABOUT A REFERRAL AND WANTS HER NURSE TO GIVE HER A CALL.Marland Kitchen

## 2023-04-08 NOTE — Telephone Encounter (Signed)
Patient received a letter concerning her referral - she wants to go to Dr. Lonzo Cloud instead - please change referral

## 2023-04-09 NOTE — Telephone Encounter (Signed)
Pls send this request to [facility] Endocrinology office Thx

## 2023-04-09 NOTE — Assessment & Plan Note (Signed)
Phentermine po  Potential benefits of a short/long term phentermine  use as well as potential risks  and complications were explained to the patient and were aknowledged.

## 2023-04-12 ENCOUNTER — Other Ambulatory Visit: Payer: Self-pay | Admitting: Internal Medicine

## 2023-04-16 ENCOUNTER — Other Ambulatory Visit: Payer: Self-pay | Admitting: Internal Medicine

## 2023-04-27 ENCOUNTER — Other Ambulatory Visit: Payer: Self-pay | Admitting: Gastroenterology

## 2023-04-27 DIAGNOSIS — R140 Abdominal distension (gaseous): Secondary | ICD-10-CM

## 2023-04-27 DIAGNOSIS — Z1211 Encounter for screening for malignant neoplasm of colon: Secondary | ICD-10-CM

## 2023-04-27 DIAGNOSIS — D128 Benign neoplasm of rectum: Secondary | ICD-10-CM

## 2023-04-29 ENCOUNTER — Other Ambulatory Visit: Payer: Self-pay | Admitting: Internal Medicine

## 2023-04-29 DIAGNOSIS — E049 Nontoxic goiter, unspecified: Secondary | ICD-10-CM

## 2023-04-29 DIAGNOSIS — R0609 Other forms of dyspnea: Secondary | ICD-10-CM

## 2023-04-29 NOTE — Progress Notes (Signed)
ENT referral needs to be moved up  - she has SOB w/walking and when laying down, crocked voice

## 2023-05-13 ENCOUNTER — Encounter: Payer: Self-pay | Admitting: Internal Medicine

## 2023-05-13 ENCOUNTER — Ambulatory Visit: Payer: 59 | Admitting: Internal Medicine

## 2023-05-13 VITALS — BP 126/86 | HR 84 | Temp 98.4°F | Ht 63.0 in | Wt 251.0 lb

## 2023-05-13 DIAGNOSIS — E785 Hyperlipidemia, unspecified: Secondary | ICD-10-CM

## 2023-05-13 DIAGNOSIS — I10 Essential (primary) hypertension: Secondary | ICD-10-CM

## 2023-05-13 DIAGNOSIS — R7303 Prediabetes: Secondary | ICD-10-CM

## 2023-05-13 DIAGNOSIS — R600 Localized edema: Secondary | ICD-10-CM | POA: Diagnosis not present

## 2023-05-13 LAB — URINALYSIS, ROUTINE W REFLEX MICROSCOPIC
Bilirubin Urine: NEGATIVE
Hgb urine dipstick: NEGATIVE
Ketones, ur: NEGATIVE
Leukocytes,Ua: NEGATIVE
Nitrite: NEGATIVE
RBC / HPF: NONE SEEN (ref 0–?)
Specific Gravity, Urine: 1.015 (ref 1.000–1.030)
Total Protein, Urine: NEGATIVE
Urine Glucose: NEGATIVE
Urobilinogen, UA: 0.2 (ref 0.0–1.0)
WBC, UA: NONE SEEN (ref 0–?)
pH: 6.5 (ref 5.0–8.0)

## 2023-05-13 LAB — BRAIN NATRIURETIC PEPTIDE: Pro B Natriuretic peptide (BNP): 16 pg/mL (ref 0.0–100.0)

## 2023-05-13 LAB — LIPID PANEL
Cholesterol: 170 mg/dL (ref 0–200)
HDL: 53.8 mg/dL (ref >39.00)
LDL Cholesterol: 99 mg/dL (ref 0–99)
NonHDL: 115.93
Total CHOL/HDL Ratio: 3
Triglycerides: 85 mg/dL (ref 0.0–149.0)
VLDL: 17 mg/dL (ref 0.0–40.0)

## 2023-05-13 LAB — D-DIMER, QUANTITATIVE: D-Dimer, Quant: 0.42 ug{FEU}/mL (ref <0.50)

## 2023-05-13 LAB — TROPONIN I (HIGH SENSITIVITY): High Sens Troponin I: 8 ng/L (ref 2–17)

## 2023-05-13 LAB — HEMOGLOBIN A1C: Hgb A1c MFr Bld: 6.2 % (ref 4.6–6.5)

## 2023-05-13 NOTE — Patient Instructions (Signed)
Hypertension, Adult High blood pressure (hypertension) is when the force of blood pumping through the arteries is too strong. The arteries are the blood vessels that carry blood from the heart throughout the body. Hypertension forces the heart to work harder to pump blood and may cause arteries to become narrow or stiff. Untreated or uncontrolled hypertension can lead to a heart attack, heart failure, a stroke, kidney disease, and other problems. A blood pressure reading consists of a higher number over a lower number. Ideally, your blood pressure should be below 120/80. The first ("top") number is called the systolic pressure. It is a measure of the pressure in your arteries as your heart beats. The second ("bottom") number is called the diastolic pressure. It is a measure of the pressure in your arteries as the heart relaxes. What are the causes? The exact cause of this condition is not known. There are some conditions that result in high blood pressure. What increases the risk? Certain factors may make you more likely to develop high blood pressure. Some of these risk factors are under your control, including: Smoking. Not getting enough exercise or physical activity. Being overweight. Having too much fat, sugar, calories, or salt (sodium) in your diet. Drinking too much alcohol. Other risk factors include: Having a personal history of heart disease, diabetes, high cholesterol, or kidney disease. Stress. Having a family history of high blood pressure and high cholesterol. Having obstructive sleep apnea. Age. The risk increases with age. What are the signs or symptoms? High blood pressure may not cause symptoms. Very high blood pressure (hypertensive crisis) may cause: Headache. Fast or irregular heartbeats (palpitations). Shortness of breath. Nosebleed. Nausea and vomiting. Vision changes. Severe chest pain, dizziness, and seizures. How is this diagnosed? This condition is diagnosed by  measuring your blood pressure while you are seated, with your arm resting on a flat surface, your legs uncrossed, and your feet flat on the floor. The cuff of the blood pressure monitor will be placed directly against the skin of your upper arm at the level of your heart. Blood pressure should be measured at least twice using the same arm. Certain conditions can cause a difference in blood pressure between your right and left arms. If you have a high blood pressure reading during one visit or you have normal blood pressure with other risk factors, you may be asked to: Return on a different day to have your blood pressure checked again. Monitor your blood pressure at home for 1 week or longer. If you are diagnosed with hypertension, you may have other blood or imaging tests to help your health care provider understand your overall risk for other conditions. How is this treated? This condition is treated by making healthy lifestyle changes, such as eating healthy foods, exercising more, and reducing your alcohol intake. You may be referred for counseling on a healthy diet and physical activity. Your health care provider may prescribe medicine if lifestyle changes are not enough to get your blood pressure under control and if: Your systolic blood pressure is above 130. Your diastolic blood pressure is above 80. Your personal target blood pressure may vary depending on your medical conditions, your age, and other factors. Follow these instructions at home: Eating and drinking  Eat a diet that is high in fiber and potassium, and low in sodium, added sugar, and fat. An example of this eating plan is called the DASH diet. DASH stands for Dietary Approaches to Stop Hypertension. To eat this way: Eat   plenty of fresh fruits and vegetables. Try to fill one half of your plate at each meal with fruits and vegetables. Eat whole grains, such as whole-wheat pasta, brown rice, or whole-grain bread. Fill about one  fourth of your plate with whole grains. Eat or drink low-fat dairy products, such as skim milk or low-fat yogurt. Avoid fatty cuts of meat, processed or cured meats, and poultry with skin. Fill about one fourth of your plate with lean proteins, such as fish, chicken without skin, beans, eggs, or tofu. Avoid pre-made and processed foods. These tend to be higher in sodium, added sugar, and fat. Reduce your daily sodium intake. Many people with hypertension should eat less than 1,500 mg of sodium a day. Do not drink alcohol if: Your health care provider tells you not to drink. You are pregnant, may be pregnant, or are planning to become pregnant. If you drink alcohol: Limit how much you have to: 0-1 drink a day for women. 0-2 drinks a day for men. Know how much alcohol is in your drink. In the [place] one drink equals one 12 oz bottle of beer (355 mL), one 5 oz glass of wine (148 mL), or one 1 oz glass of hard liquor (44 mL). Lifestyle  Work with your health care provider to maintain a healthy body weight or to lose weight. Ask what an ideal weight is for you. Get at least 30 minutes of exercise that causes your heart to beat faster (aerobic exercise) most days of the week. Activities may include walking, swimming, or biking. Include exercise to strengthen your muscles (resistance exercise), such as Pilates or lifting weights, as part of your weekly exercise routine. Try to do these types of exercises for 30 minutes at least 3 days a week. Do not use any products that contain nicotine or tobacco. These products include cigarettes, chewing tobacco, and vaping devices, such as e-cigarettes. If you need help quitting, ask your health care provider. Monitor your blood pressure at home as told by your health care provider. Keep all follow-up visits. This is important. Medicines Take over-the-counter and prescription medicines only as told by your health care provider. Follow directions carefully. Blood  pressure medicines must be taken as prescribed. Do not skip doses of blood pressure medicine. Doing this puts you at risk for problems and can make the medicine less effective. Ask your health care provider about side effects or reactions to medicines that you should watch for. Contact a health care provider if you: Think you are having a reaction to a medicine you are taking. Have headaches that keep coming back (recurring). Feel dizzy. Have swelling in your ankles. Have trouble with your vision. Get help right away if you: Develop a severe headache or confusion. Have unusual weakness or numbness. Feel faint. Have severe pain in your chest or abdomen. Vomit repeatedly. Have trouble breathing. These symptoms may be an emergency. Get help right away. Call 911. Do not wait to see if the symptoms will go away. Do not drive yourself to the hospital. Summary Hypertension is when the force of blood pumping through your arteries is too strong. If this condition is not controlled, it may put you at risk for serious complications. Your personal target blood pressure may vary depending on your medical conditions, your age, and other factors. For most people, a normal blood pressure is less than 120/80. Hypertension is treated with lifestyle changes, medicines, or a combination of both. Lifestyle changes include losing weight, eating a healthy,   low-sodium diet, exercising more, and limiting alcohol. This information is not intended to replace advice given to you by your health care provider. Make sure you discuss any questions you have with your health care provider. Document Revised: 05/28/2021 Document Reviewed: 05/28/2021 Elsevier Patient Education  2024 Elsevier Inc.  

## 2023-05-13 NOTE — Progress Notes (Signed)
Subjective:  Patient ID: [name], female    DOB: 1971-07-09  Age: 52 y.o. MRN: [id number]  CC: Hypertension and Hyperlipidemia   HPI [name] presents for f/up ----  Discussed the use of AI scribe software for clinical note transcription with the patient, who gave verbal consent to proceed.  History of Present Illness   The patient, known to have a goiter with multiple nodules, presented with new onset bilateral ankle swelling and leg pain. The symptoms started on a Saturday after a long car ride and persisted despite rest and elevation. The swelling was noted to decrease after rest but recurred with activity at work. The patient also reported an episode of increased heart rate while at rest approximately two weeks prior to the visit.  In addition to these symptoms, the patient has been experiencing shortness of breath for several months, which she attributes to her known goiter. She denied any chest pain but reported feeling generally tired. The patient has been on Tribenzor for hypertension for several years and does not take any medication for pain, except for occasional Naproxen. She denied any history of diabetes.       Outpatient Medications Prior to Visit  Medication Sig Dispense Refill   Albuterol-Budesonide (AIRSUPRA) 90-80 MCG/ACT AERO Inhale 2 Inhalations into the lungs every 4 (four) hours as needed. 10 g 5   ALPRAZolam (XANAX) 1 MG tablet TAKE 1 TABLET (1 MG TOTAL) BY MOUTH 3 TIMES/DAY AS NEEDED-BETWEEN MEALS & BEDTIME FOR ANXIETY. 90 tablet 1   Ascorbic Acid (VITAMIN C PO) Take by mouth.     chlorhexidine (HIBICLENS) 4 % external liquid Apply topically daily as needed. Use 3/week 1000 mL 3   Cholecalciferol 25 MCG (1000 UT) tablet Take 2,000 Units by mouth daily.     cyclobenzaprine (FLEXERIL) 10 MG tablet Take 1 tablet (10 mg total) by mouth at bedtime. 90 tablet 3   doxycycline (VIBRA-TABS) 100 MG tablet Take 1 tablet (100 mg total) by mouth 2 (two) times  daily. 20 tablet 3   Doxylamine-Phenylephrine-APAP (VICKS SINEX NIGHTTIME PO) Take by mouth.     fluticasone (FLONASE) 50 MCG/ACT nasal spray SPRAY 2 SPRAYS INTO EACH NOSTRIL EVERY DAY 48 mL 0   hydrOXYzine (VISTARIL) 25 MG capsule Take 1 capsule (25 mg total) by mouth at bedtime as needed. 90 capsule 3   hyoscyamine (LEVSIN SL) 0.125 MG SL tablet Place 1 tablet (0.125 mg total) under the tongue every 4 (four) hours as needed. 45 tablet 1   KLOR-CON M20 20 MEQ tablet TAKE 1 TABLET BY MOUTH TWICE A DAY 180 tablet 1   LINZESS 145 MCG CAPS capsule TAKE 1 CAPSULE BY MOUTH DAILY BEFORE BREAKFAST. 30 capsule 3   LINZESS 290 MCG CAPS capsule TAKE 1 CAPSULE BY MOUTH EVERY DAY BEFORE BREAKFAST 30 capsule 3   methylPREDNISolone (MEDROL DOSEPAK) 4 MG TBPK tablet As directed 21 tablet 0   montelukast (SINGULAIR) 10 MG tablet TAKE 1 TABLET BY MOUTH EVERYDAY AT BEDTIME 90 tablet 1   mupirocin cream (BACTROBAN) 2 % Apply 1 application topically 2 (two) times daily. 30 g 3   naproxen (NAPROSYN) 500 MG tablet Take 1 tablet (500 mg total) by mouth 2 (two) times daily with a meal. 60 tablet 3   norethindrone-ethinyl estradiol (FYAVOLV) 1-5 MG-MCG TABS tablet Take by mouth daily.     pantoprazole (PROTONIX) 40 MG tablet TAKE 1 TABLET BY MOUTH EVERY DAY 90 tablet 1   phentermine (ADIPEX-P) 37.5 MG tablet Take 1  tablet (37.5 mg total) by mouth daily before breakfast. 30 tablet 2   Potassium Chloride ER 20 MEQ TBCR TAKE 1 TABLET (20 MEQ) BY MOUTH DAILY. ANNUAL APPT DUE IN AUGUST MUST SEE PROVIDER FOR FUTURE REFILL 90 tablet 0   Prenatal Vit-Fe Fumarate-FA (M-NATAL PLUS) 27-1 MG TABS TAKE 1 TABLET BY MOUTH EVERY DAY 90 tablet 1   pseudoephedrine (SUDAFED) 120 MG 12 hr tablet Take 1 tablet (120 mg total) by mouth 2 (two) times daily as needed for congestion. 20 tablet 2   rizatriptan (MAXALT) 10 MG tablet TAKE 1 TABLET BY MOUTH ONCE AS NEEDED FOR UP TO 1 DOSE FOR MIGRAINE. MAY REPEAT IN 2 HOURS 12 tablet 5   Semaglutide,  2 MG/DOSE, 8 MG/3ML SOPN Inject 2 mg as directed once a week. 3 mL 1   tirzepatide (MOUNJARO) 2.5 MG/0.5ML Pen Inject 2.5 mg into the skin once a week. 2 mL 2   triamcinolone (KENALOG) 0.1 % paste Use as directed 1 application. in the mouth or throat 3 (three) times daily. On canker sores 5 g 1   valACYclovir (VALTREX) 1000 MG tablet Take 1 tablet (1,000 mg total) by mouth 3 (three) times daily. 21 tablet 1   Vitamin D, Ergocalciferol, (DRISDOL) 1.25 MG (50000 UNIT) CAPS capsule Take 1 capsule (50,000 Units total) by mouth every 7 (seven) days. 5 capsule 0   Olmesartan-amLODIPine-HCTZ 40-5-12.5 MG TABS TAKE 1 TABLET BY MOUTH EVERY DAY 90 tablet 2   No facility-administered medications prior to visit.    ROS Review of Systems  Constitutional:  Positive for unexpected weight change (wt gain). Negative for appetite change, chills, diaphoresis and fatigue.  HENT: Negative.    Eyes: Negative.   Respiratory: Negative.  Negative for cough, chest tightness, shortness of breath and wheezing.   Cardiovascular:  Positive for leg swelling. Negative for chest pain and palpitations.  Gastrointestinal:  Negative for abdominal pain, constipation, diarrhea, nausea and vomiting.  Genitourinary:  Negative for difficulty urinating and dysuria.  Musculoskeletal: Negative.   Skin: Negative.   Neurological:  Negative for dizziness, weakness and light-headedness.  Hematological:  Negative for adenopathy. Does not bruise/bleed easily.  Psychiatric/Behavioral: Negative.      Objective:  BP 126/86 (BP Location: Left Arm, Patient Position: Sitting, Cuff Size: Large)   Pulse 84   Temp 98.4 F (36.9 C) (Oral)   Ht 5\' 3"  (1.6 m)   Wt 251 lb (113.9 kg)   SpO2 97%   BMI 44.46 kg/m   BP Readings from Last 3 Encounters:  05/13/23 126/86  04/01/23 118/80  02/19/23 120/82    Wt Readings from Last 3 Encounters:  05/13/23 251 lb (113.9 kg)  04/01/23 244 lb (110.7 kg)  02/19/23 242 lb (109.8 kg)    Physical  Exam Vitals reviewed.  Constitutional:      Appearance: Normal appearance.  HENT:     Mouth/Throat:     Mouth: Mucous membranes are moist.  Eyes:     General: No scleral icterus.    Conjunctiva/sclera: Conjunctivae normal.  Cardiovascular:     Rate and Rhythm: Normal rate and regular rhythm.     Heart sounds: No murmur heard.    Comments: Trace pitting BLE ankle edema  EKG- NSR, 85 bpm No LVH, Q waves, or ST/T waves  Pulmonary:     Effort: Pulmonary effort is normal.     Breath sounds: No stridor. No wheezing, rhonchi or rales.  Abdominal:     General: Abdomen is flat and protuberant.  Palpations: There is no hepatomegaly, splenomegaly or mass.     Tenderness: There is no abdominal tenderness. There is no guarding or rebound.  Musculoskeletal:        General: Normal range of motion.     Cervical back: Neck supple.     Right lower leg: Edema present.     Left lower leg: Edema present.  Lymphadenopathy:     Cervical: No cervical adenopathy.  Skin:    General: Skin is warm and dry.     Findings: No rash.  Neurological:     General: No focal deficit present.     Mental Status: She is alert.  Psychiatric:        Mood and Affect: Mood normal.        Behavior: Behavior normal.     Lab Results  Component Value Date   WBC 4.3 03/20/2023   HGB 12.8 03/20/2023   HCT 40.1 03/20/2023   PLT 157.0 03/20/2023   GLUCOSE 101 (H) 03/20/2023   CHOL 170 04/01/2022   TRIG 65 04/01/2022   HDL 55 04/01/2022   LDLCALC 102 (H) 04/01/2022   ALT 22 02/23/2023   AST 22 02/23/2023   NA 135 03/20/2023   K 3.6 03/20/2023   CL 101 03/20/2023   CREATININE 0.59 03/20/2023   BUN 11 03/20/2023   CO2 23 03/20/2023   TSH 0.63 03/20/2023   INR 0.9 RATIO 03/15/2007   HGBA1C 5.8 (H) 09/22/2022    [place] FNA BX THYROID 1ST LESION AFIRMA  Result Date: 03/24/2023 INDICATION: Indeterminate thyroid nodule EXAM: ULTRASOUND GUIDED FINE NEEDLE ASPIRATION OF INDETERMINATE THYROID NODULE COMPARISON:   February 24, 2023 MEDICATIONS: None COMPLICATIONS: None immediate. TECHNIQUE: Informed written consent was obtained from the patient after a discussion of the risks, benefits and alternatives to treatment. Questions regarding the procedure were encouraged and answered. A timeout was performed prior to the initiation of the procedure. Pre-procedural ultrasound scanning demonstrated unchanged size and appearance of the indeterminate nodule within the left thyroid lobe. The procedure was planned. The neck was prepped in the usual sterile fashion, and a sterile drape was applied covering the operative field. A timeout was performed prior to the initiation of the procedure. Local anesthesia was provided with 1% lidocaine. Under direct ultrasound guidance, x5 FNA biopsies were performed of the nodule in the left thyroid lobe with a 25 gauge needle. Multiple ultrasound images were saved for procedural documentation purposes. The samples were prepared and submitted to pathology. Limited post procedural scanning was negative for hematoma or additional complication. Dressings were placed. The patient tolerated the above procedures procedure well without immediate postprocedural complication. FINDINGS: Nodule reference number based on prior diagnostic ultrasound: 6 Maximum size: 3.6 cm Location: Left; Mid ACR TI-RADS risk category: TR4 (4-6 points) Reason for biopsy: meets ACR TI-RADS criteria Ultrasound imaging confirms appropriate placement of the needles within the thyroid nodule. IMPRESSION: Technically successful ultrasound guided fine needle aspiration of thyroid nodule in the left thyroid lobe. Electronically Signed   By: Olive Bass M.D.   On: 03/24/2023 15:45    Assessment & Plan:  Essential hypertension- Her BP is very well controlled.  Labs and EKG are reassuring.  I think the lower extremity edema is caused by amlodipine so I recommended that she stop taking Tribenzor.  I recommended that she start taking  indapamide but she wants to discuss this with her PCP. -     EKG 12-Lead -     Urinalysis, Routine w reflex microscopic; Future  Prediabetes -     Hemoglobin A1c; Future  Dyslipidemia, goal LDL below 100- Statin is not indicated. -     Lipid panel; Future  Bilateral leg edema -     EKG 12-Lead -     Urinalysis, Routine w reflex microscopic; Future -     Brain natriuretic peptide; Future -     Troponin I (High Sensitivity); Future -     D-dimer, quantitative; Future     Follow-up: Return in about 3 months (around 08/13/2023).  Sanda Linger, MD

## 2023-05-18 ENCOUNTER — Other Ambulatory Visit: Payer: Self-pay | Admitting: Internal Medicine

## 2023-05-19 DIAGNOSIS — K219 Gastro-esophageal reflux disease without esophagitis: Secondary | ICD-10-CM | POA: Insufficient documentation

## 2023-05-20 ENCOUNTER — Telehealth: Payer: Self-pay | Admitting: Internal Medicine

## 2023-05-20 DIAGNOSIS — E049 Nontoxic goiter, unspecified: Secondary | ICD-10-CM

## 2023-05-20 DIAGNOSIS — R7303 Prediabetes: Secondary | ICD-10-CM

## 2023-05-20 NOTE — Telephone Encounter (Signed)
Patient was not pleased with her referral - she would like for you to call her back to discuss this.  [phone]

## 2023-05-21 ENCOUNTER — Other Ambulatory Visit (HOSPITAL_COMMUNITY): Payer: Self-pay

## 2023-05-21 ENCOUNTER — Telehealth: Payer: Self-pay | Admitting: Pharmacy Technician

## 2023-05-21 NOTE — Telephone Encounter (Signed)
Pt has stated that she was not please with the care she was given from her recent referral to ENT with Dr. Pollyann Kennedy.  Pt states she is still having SOB, Tiredness, Dry mouth, dry skin and weakness.  Pt states she would like to go see a Endocrinologist, the provider she wants to see is Dr. Clyda Greener.

## 2023-05-21 NOTE — Telephone Encounter (Signed)
Pharmacy Patient Advocate Encounter   Received notification from CoverMyMeds that prior authorization for Phentermine HCl 37.5MG  tablets is required/requested.   Insurance verification completed.   The patient is insured through [facility] .   Per test claim: PA required; PA submitted to [facility] via CoverMyMeds Key/confirmation #/EOC [id number] Status is pending

## 2023-05-24 ENCOUNTER — Encounter: Payer: Self-pay | Admitting: Internal Medicine

## 2023-05-25 ENCOUNTER — Encounter: Payer: 59 | Admitting: Internal Medicine

## 2023-05-26 NOTE — Telephone Encounter (Signed)
I will make arrangements.  Thank you

## 2023-05-28 ENCOUNTER — Other Ambulatory Visit (HOSPITAL_COMMUNITY): Payer: Self-pay

## 2023-05-28 NOTE — Telephone Encounter (Signed)
Pharmacy Patient Advocate Encounter  Received notification from [facility] that Prior Authorization for Phentermine HCl 37.5MG  tablets has been APPROVED from 05/21/2023 to 05/20/2024. Ran test claim, Copay is $1.15 for a 30 day supply. This test claim was processed through [facility]- copay amounts may vary at other pharmacies due to pharmacy/plan contracts, or as the patient moves through the different stages of their insurance plan.   PA #/Case ID/Reference #: [id number]

## 2023-05-29 ENCOUNTER — Ambulatory Visit (INDEPENDENT_AMBULATORY_CARE_PROVIDER_SITE_OTHER): Payer: 59 | Admitting: Internal Medicine

## 2023-05-29 ENCOUNTER — Encounter: Payer: Self-pay | Admitting: Internal Medicine

## 2023-05-29 VITALS — BP 144/98 | HR 83 | Temp 98.2°F | Ht 63.0 in | Wt 253.4 lb

## 2023-05-29 DIAGNOSIS — E538 Deficiency of other specified B group vitamins: Secondary | ICD-10-CM | POA: Diagnosis not present

## 2023-05-29 DIAGNOSIS — Z6841 Body Mass Index (BMI) 40.0 and over, adult: Secondary | ICD-10-CM

## 2023-05-29 DIAGNOSIS — R0609 Other forms of dyspnea: Secondary | ICD-10-CM | POA: Diagnosis not present

## 2023-05-29 DIAGNOSIS — Z23 Encounter for immunization: Secondary | ICD-10-CM | POA: Diagnosis not present

## 2023-05-29 DIAGNOSIS — G4733 Obstructive sleep apnea (adult) (pediatric): Secondary | ICD-10-CM | POA: Insufficient documentation

## 2023-05-29 DIAGNOSIS — I10 Essential (primary) hypertension: Secondary | ICD-10-CM

## 2023-05-29 MED ORDER — AMPHETAMINE-DEXTROAMPHETAMINE 5 MG PO TABS: 5.0000 mg | ORAL_TABLET | Freq: Two times a day (BID) | ORAL | 0 refills | Status: DC

## 2023-05-29 NOTE — Assessment & Plan Note (Signed)
No change Cont w/Tribenzor

## 2023-05-29 NOTE — Assessment & Plan Note (Signed)
On B12 

## 2023-05-29 NOTE — Assessment & Plan Note (Addendum)
Probable OSA. Offered pulm referral Wt loss is needed

## 2023-05-29 NOTE — Progress Notes (Signed)
Subjective:  Patient ID: [name], female    DOB: 1970-11-01  Age: 52 y.o. MRN: [id number]  CC: Medical Management of Chronic Issues ( f/u appt, patient wants a referral to see a endocrinologist,( patient is willing to see any provider outside of [facility]) she just wants a soon appointment.  the last referral that was sent in on 10/22 was sent to a Rheumatology.)   HPI [name] presents for pains and FMS, CFS, SOB C/o wt gain, choking, brain fog since July  Outpatient Medications Prior to Visit  Medication Sig Dispense Refill   Albuterol-Budesonide (AIRSUPRA) 90-80 MCG/ACT AERO Inhale 2 Inhalations into the lungs every 4 (four) hours as needed. 10 g 5   ALPRAZolam (XANAX) 1 MG tablet TAKE 1 TABLET (1 MG TOTAL) BY MOUTH 3 TIMES/DAY AS NEEDED-BETWEEN MEALS & BEDTIME FOR ANXIETY. 90 tablet 1   Ascorbic Acid (VITAMIN C PO) Take by mouth.     chlorhexidine (HIBICLENS) 4 % external liquid Apply topically daily as needed. Use 3/week 1000 mL 3   Cholecalciferol 25 MCG (1000 UT) tablet Take 2,000 Units by mouth daily.     cyclobenzaprine (FLEXERIL) 10 MG tablet Take 1 tablet (10 mg total) by mouth at bedtime. 90 tablet 3   doxycycline (VIBRA-TABS) 100 MG tablet Take 1 tablet (100 mg total) by mouth 2 (two) times daily. 20 tablet 3   Doxylamine-Phenylephrine-APAP (VICKS SINEX NIGHTTIME PO) Take by mouth.     fluticasone (FLONASE) 50 MCG/ACT nasal spray SPRAY 2 SPRAYS INTO EACH NOSTRIL EVERY DAY 48 mL 0   hydrOXYzine (VISTARIL) 25 MG capsule 1-2 po at bedtime prn 270 capsule 2   hyoscyamine (LEVSIN SL) 0.125 MG SL tablet Place 1 tablet (0.125 mg total) under the tongue every 4 (four) hours as needed. 45 tablet 1   KLOR-CON M20 20 MEQ tablet TAKE 1 TABLET BY MOUTH TWICE A DAY 180 tablet 1   LINZESS 145 MCG CAPS capsule TAKE 1 CAPSULE BY MOUTH DAILY BEFORE BREAKFAST. 30 capsule 3   LINZESS 290 MCG CAPS capsule TAKE 1 CAPSULE BY MOUTH EVERY DAY BEFORE BREAKFAST 30 capsule 3    methylPREDNISolone (MEDROL DOSEPAK) 4 MG TBPK tablet As directed 21 tablet 0   montelukast (SINGULAIR) 10 MG tablet TAKE 1 TABLET BY MOUTH EVERYDAY AT BEDTIME 90 tablet 1   mupirocin cream (BACTROBAN) 2 % Apply 1 application topically 2 (two) times daily. 30 g 3   naproxen (NAPROSYN) 500 MG tablet Take 1 tablet (500 mg total) by mouth 2 (two) times daily with a meal. 60 tablet 3   norethindrone-ethinyl estradiol (FYAVOLV) 1-5 MG-MCG TABS tablet Take by mouth daily.     pantoprazole (PROTONIX) 40 MG tablet TAKE 1 TABLET BY MOUTH EVERY DAY 90 tablet 1   phentermine (ADIPEX-P) 37.5 MG tablet Take 1 tablet (37.5 mg total) by mouth daily before breakfast. 30 tablet 2   Potassium Chloride ER 20 MEQ TBCR TAKE 1 TABLET (20 MEQ) BY MOUTH DAILY. ANNUAL APPT DUE IN AUGUST MUST SEE PROVIDER FOR FUTURE REFILL 90 tablet 0   Prenatal Vit-Fe Fumarate-FA (M-NATAL PLUS) 27-1 MG TABS TAKE 1 TABLET BY MOUTH EVERY DAY 90 tablet 1   pseudoephedrine (SUDAFED) 120 MG 12 hr tablet Take 1 tablet (120 mg total) by mouth 2 (two) times daily as needed for congestion. 20 tablet 2   rizatriptan (MAXALT) 10 MG tablet TAKE 1 TABLET BY MOUTH ONCE AS NEEDED FOR UP TO 1 DOSE FOR MIGRAINE. MAY REPEAT IN 2 HOURS 12 tablet  5   Semaglutide, 2 MG/DOSE, 8 MG/3ML SOPN Inject 2 mg as directed once a week. 3 mL 1   tirzepatide (MOUNJARO) 2.5 MG/0.5ML Pen Inject 2.5 mg into the skin once a week. 2 mL 2   triamcinolone (KENALOG) 0.1 % paste Use as directed 1 application. in the mouth or throat 3 (three) times daily. On canker sores 5 g 1   valACYclovir (VALTREX) 1000 MG tablet Take 1 tablet (1,000 mg total) by mouth 3 (three) times daily. 21 tablet 1   Vitamin D, Ergocalciferol, (DRISDOL) 1.25 MG (50000 UNIT) CAPS capsule Take 1 capsule (50,000 Units total) by mouth every 7 (seven) days. 5 capsule 0   No facility-administered medications prior to visit.    ROS: Review of Systems  Constitutional:  Positive for fatigue. Negative for activity  change, appetite change, chills and unexpected weight change.  HENT:  Negative for congestion, mouth sores and sinus pressure.   Eyes:  Negative for visual disturbance.  Respiratory:  Positive for shortness of breath, wheezing and stridor. Negative for cough and chest tightness.   Gastrointestinal:  Negative for abdominal pain and nausea.  Genitourinary:  Positive for vaginal pain. Negative for difficulty urinating and frequency.  Musculoskeletal:  Positive for arthralgias and back pain. Negative for gait problem.  Skin:  Negative for pallor and rash.  Neurological:  Negative for dizziness, tremors, weakness, numbness and headaches.  Psychiatric/Behavioral:  Positive for decreased concentration, dysphoric mood, self-injury and sleep disturbance. Negative for confusion. The patient is not nervous/anxious.     Objective:  BP (!) 144/98   Pulse 83   Temp 98.2 F (36.8 C) (Oral)   Ht 5\' 3"  (1.6 m)   Wt 253 lb 6 oz (114.9 kg)   SpO2 97%   BMI 44.88 kg/m   BP Readings from Last 3 Encounters:  05/29/23 (!) 144/98  05/13/23 126/86  04/01/23 118/80    Wt Readings from Last 3 Encounters:  05/29/23 253 lb 6 oz (114.9 kg)  05/13/23 251 lb (113.9 kg)  04/01/23 244 lb (110.7 kg)    Physical Exam Constitutional:      General: She is not in acute distress.    Appearance: She is well-developed. She is obese.  HENT:     Head: Normocephalic.     Right Ear: External ear normal.     Left Ear: External ear normal.     Nose: Nose normal.     Mouth/Throat:     Pharynx: No posterior oropharyngeal erythema.  Eyes:     General:        Right eye: No discharge.        Left eye: No discharge.     Conjunctiva/sclera: Conjunctivae normal.     Pupils: Pupils are equal, round, and reactive to light.  Neck:     Thyroid: No thyromegaly.     Vascular: No JVD.     Trachea: No tracheal deviation.  Cardiovascular:     Rate and Rhythm: Normal rate and regular rhythm.     Heart sounds: Normal heart  sounds.  Pulmonary:     Effort: No respiratory distress.     Breath sounds: No stridor. No wheezing.  Abdominal:     General: Bowel sounds are normal. There is no distension.     Palpations: Abdomen is soft. There is no mass.     Tenderness: There is no abdominal tenderness. There is no guarding or rebound.  Musculoskeletal:        General: No tenderness.  Cervical back: Normal range of motion and neck supple. No rigidity.     Right lower leg: No edema.     Left lower leg: No edema.  Lymphadenopathy:     Cervical: No cervical adenopathy.  Skin:    Findings: No erythema or rash.  Neurological:     Mental Status: She is oriented to person, place, and time. Mental status is at baseline.     Cranial Nerves: No cranial nerve deficit.     Motor: No weakness or abnormal muscle tone.     Coordination: Coordination normal.     Gait: Gait normal.     Deep Tendon Reflexes: Reflexes normal.  Psychiatric:        Behavior: Behavior normal.        Thought Content: Thought content normal.        Judgment: Judgment normal.   Goiter    A total time of 45 minutes was spent preparing to see the patient, reviewing tests, x-rays, operative reports and other medical records.  Also, obtaining history and performing comprehensive physical exam.  Additionally, counseling the patient regarding the above listed issues - CFS, OSA, obesity.   Finally, documenting clinical information in the health records, coordination of care, educating the patient.   Lab Results  Component Value Date   WBC 4.3 03/20/2023   HGB 12.8 03/20/2023   HCT 40.1 03/20/2023   PLT 157.0 03/20/2023   GLUCOSE 101 (H) 03/20/2023   CHOL 170 05/13/2023   TRIG 85.0 05/13/2023   HDL 53.80 05/13/2023   LDLCALC 99 05/13/2023   ALT 22 02/23/2023   AST 22 02/23/2023   NA 135 03/20/2023   K 3.6 03/20/2023   CL 101 03/20/2023   CREATININE 0.59 03/20/2023   BUN 11 03/20/2023   CO2 23 03/20/2023   TSH 0.63 03/20/2023   INR 0.9  RATIO 03/15/2007   HGBA1C 6.2 05/13/2023    [place] FNA BX THYROID 1ST LESION AFIRMA  Result Date: 03/24/2023 INDICATION: Indeterminate thyroid nodule EXAM: ULTRASOUND GUIDED FINE NEEDLE ASPIRATION OF INDETERMINATE THYROID NODULE COMPARISON:  February 24, 2023 MEDICATIONS: None COMPLICATIONS: None immediate. TECHNIQUE: Informed written consent was obtained from the patient after a discussion of the risks, benefits and alternatives to treatment. Questions regarding the procedure were encouraged and answered. A timeout was performed prior to the initiation of the procedure. Pre-procedural ultrasound scanning demonstrated unchanged size and appearance of the indeterminate nodule within the left thyroid lobe. The procedure was planned. The neck was prepped in the usual sterile fashion, and a sterile drape was applied covering the operative field. A timeout was performed prior to the initiation of the procedure. Local anesthesia was provided with 1% lidocaine. Under direct ultrasound guidance, x5 FNA biopsies were performed of the nodule in the left thyroid lobe with a 25 gauge needle. Multiple ultrasound images were saved for procedural documentation purposes. The samples were prepared and submitted to pathology. Limited post procedural scanning was negative for hematoma or additional complication. Dressings were placed. The patient tolerated the above procedures procedure well without immediate postprocedural complication. FINDINGS: Nodule reference number based on prior diagnostic ultrasound: 6 Maximum size: 3.6 cm Location: Left; Mid ACR TI-RADS risk category: TR4 (4-6 points) Reason for biopsy: meets ACR TI-RADS criteria Ultrasound imaging confirms appropriate placement of the needles within the thyroid nodule. IMPRESSION: Technically successful ultrasound guided fine needle aspiration of thyroid nodule in the left thyroid lobe. Electronically Signed   By: Olive Bass M.D.   On: 03/24/2023 15:45  Assessment &  Plan:   Problem List Items Addressed This Visit     B12 deficiency    On B12      OBESITY, MORBID WITH STARTING BMI 44    Ozempic 2 mg/wk - did not work  Try 18:6 intermittent fasting is an eating strategy that involves fasting for 18 hours and then eating during a 6-hour window. During this fasting period, you may drink water, tea, coffee, or other zero-calorie beverages. A feeding window follows this fasting window where you eat healthy, nutrient-dense foods Walk >5000 steps/d  Get more sleep, treat OSA  Surgery ref - Dr Fredonia Highland      Relevant Medications   amphetamine-dextroamphetamine (ADDERALL) 5 MG tablet   Other Relevant Orders   Ambulatory referral to General Surgery   ECHOCARDIOGRAM COMPLETE   Essential hypertension    No change Cont w/Tribenzor      Relevant Orders   Flu vaccine trivalent PF, 6mos and older(Flulaval,Afluria,Fluarix,Fluzone)   DOE (dyspnea on exertion) - Primary    Obesity related Check 2D ECHO      Relevant Orders   ECHOCARDIOGRAM COMPLETE   OSA (obstructive sleep apnea)    Probable OSA. Offered pulm referral Wt loss is needed         Meds ordered this encounter  Medications   amphetamine-dextroamphetamine (ADDERALL) 5 MG tablet    Sig: Take 1 tablet (5 mg total) by mouth 2 (two) times daily with a meal. Breakfast and lunch    Dispense:  60 tablet    Refill:  0      Follow-up: Return in about 4 weeks (around 06/26/2023) for a follow-up visit.  Sonda Primes, MD

## 2023-05-29 NOTE — Assessment & Plan Note (Signed)
Obesity related Check 2D ECHO

## 2023-05-29 NOTE — Assessment & Plan Note (Signed)
Ozempic 2 mg/wk - did not work  Try 18:6 intermittent fasting is an eating strategy that involves fasting for 18 hours and then eating during a 6-hour window. During this fasting period, you may drink water, tea, coffee, or other zero-calorie beverages. A feeding window follows this fasting window where you eat healthy, nutrient-dense foods Walk >5000 steps/d  Get more sleep, treat OSA  Surgery ref - Dr Fredonia Highland

## 2023-06-09 ENCOUNTER — Other Ambulatory Visit: Payer: Self-pay | Admitting: Gastroenterology

## 2023-06-15 ENCOUNTER — Telehealth: Payer: Self-pay | Admitting: Internal Medicine

## 2023-06-15 NOTE — Telephone Encounter (Signed)
Pt dropped off paperwork for her doctor fill out and fax back to [facility] by Friday NOV 15th 2024 for this pt. Please advise. Paperwork is in the doctors mailbox up front. Thanks

## 2023-06-16 ENCOUNTER — Telehealth: Payer: Self-pay | Admitting: Internal Medicine

## 2023-06-16 NOTE — Telephone Encounter (Signed)
Patient called and said her [name] was recently approved by insurance. However, she is working with Dr. Posey Rea on getting bariatric surgery. She didn't know if she should start Mounjaro when she is getting ready to have the surgery. Patient would like a call back at [phone].

## 2023-06-17 ENCOUNTER — Ambulatory Visit (HOSPITAL_COMMUNITY): Payer: 59 | Attending: Cardiology

## 2023-06-17 DIAGNOSIS — R0609 Other forms of dyspnea: Secondary | ICD-10-CM | POA: Insufficient documentation

## 2023-06-17 LAB — ECHOCARDIOGRAM COMPLETE
Area-P 1/2: 3.77 cm2
Calc EF: 56.8 %
S' Lateral: 2.7 cm
Single Plane A2C EF: 58.6 %
Single Plane A4C EF: 60.3 %

## 2023-06-17 MED ORDER — PERFLUTREN LIPID MICROSPHERE
1.0000 mL | INTRAVENOUS | Status: AC | PRN
Start: 2023-06-17 — End: 2023-06-17
  Administered 2023-06-17: 2 mL via INTRAVENOUS

## 2023-06-17 NOTE — Telephone Encounter (Signed)
I was able to inform the pt of Dr. Loren Racer advice and she has stated understanding and will reach out to Dr. Andrey Campanile office.

## 2023-06-17 NOTE — Telephone Encounter (Signed)
Not.  She should discuss it with Dr. Andrey Campanile when she sees him.  Thank you

## 2023-06-21 ENCOUNTER — Other Ambulatory Visit: Payer: Self-pay | Admitting: Internal Medicine

## 2023-06-21 MED ORDER — CARVEDILOL 6.25 MG PO TABS: 6.2500 mg | ORAL_TABLET | Freq: Two times a day (BID) | ORAL | 11 refills | Status: DC

## 2023-06-22 ENCOUNTER — Encounter: Payer: Self-pay | Admitting: Internal Medicine

## 2023-06-22 ENCOUNTER — Ambulatory Visit: Payer: 59 | Admitting: Internal Medicine

## 2023-06-22 VITALS — BP 138/96 | HR 98 | Temp 98.2°F | Ht 63.0 in | Wt 257.0 lb

## 2023-06-22 DIAGNOSIS — E538 Deficiency of other specified B group vitamins: Secondary | ICD-10-CM

## 2023-06-22 DIAGNOSIS — I10 Essential (primary) hypertension: Secondary | ICD-10-CM | POA: Diagnosis not present

## 2023-06-22 DIAGNOSIS — I5189 Other ill-defined heart diseases: Secondary | ICD-10-CM

## 2023-06-22 DIAGNOSIS — R0609 Other forms of dyspnea: Secondary | ICD-10-CM | POA: Diagnosis not present

## 2023-06-22 MED ORDER — FUROSEMIDE 40 MG PO TABS: 40.0000 mg | ORAL_TABLET | Freq: Every day | ORAL | 5 refills | Status: DC | PRN

## 2023-06-22 NOTE — Assessment & Plan Note (Signed)
Worse We started Coreg Lasix po

## 2023-06-22 NOTE — Assessment & Plan Note (Signed)
Starting [facility] soon Surgery f/u - Dr Fredonia Highland

## 2023-06-22 NOTE — Progress Notes (Unsigned)
Subjective:  Patient ID: [name], female    DOB: 02-15-1971  Age: 52 y.o. MRN: [id number]  CC: Medical Management of Chronic Issues (4 week f/u)   HPI [name] presents for wt gain, DOE, HTN [name] started Coreg today   Outpatient Medications Prior to Visit  Medication Sig Dispense Refill  . Albuterol-Budesonide (AIRSUPRA) 90-80 MCG/ACT AERO Inhale 2 Inhalations into the lungs every 4 (four) hours as needed. 10 g 5  . ALPRAZolam (XANAX) 1 MG tablet TAKE 1 TABLET (1 MG TOTAL) BY MOUTH 3 TIMES/DAY AS NEEDED-BETWEEN MEALS & BEDTIME FOR ANXIETY. 90 tablet 1  . amphetamine-dextroamphetamine (ADDERALL) 5 MG tablet Take 1 tablet (5 mg total) by mouth 2 (two) times daily with a meal. Breakfast and lunch 60 tablet 0  . Ascorbic Acid (VITAMIN C PO) Take by mouth.    . carvedilol (COREG) 6.25 MG tablet Take 1 tablet (6.25 mg total) by mouth 2 (two) times daily with a meal. 60 tablet 11  . chlorhexidine (HIBICLENS) 4 % external liquid Apply topically daily as needed. Use 3/week 1000 mL 3  . Cholecalciferol 25 MCG (1000 UT) tablet Take 2,000 Units by mouth daily.    . cyclobenzaprine (FLEXERIL) 10 MG tablet Take 1 tablet (10 mg total) by mouth at bedtime. 90 tablet 3  . doxycycline (VIBRA-TABS) 100 MG tablet Take 1 tablet (100 mg total) by mouth 2 (two) times daily. 20 tablet 3  . Doxylamine-Phenylephrine-APAP (VICKS SINEX NIGHTTIME PO) Take by mouth.    . fluticasone (FLONASE) 50 MCG/ACT nasal spray SPRAY 2 SPRAYS INTO EACH NOSTRIL EVERY DAY 48 mL 0  . hydrOXYzine (VISTARIL) 25 MG capsule 1-2 po at bedtime prn 270 capsule 2  . hyoscyamine (LEVSIN SL) 0.125 MG SL tablet Place 1 tablet (0.125 mg total) under the tongue every 4 (four) hours as needed. 45 tablet 1  . KLOR-CON M20 20 MEQ tablet TAKE 1 TABLET BY MOUTH TWICE A DAY 180 tablet 1  . LINZESS 145 MCG CAPS capsule TAKE 1 CAPSULE BY MOUTH EVERY DAY BEFORE BREAKFAST 30 capsule 3  . LINZESS 290 MCG CAPS capsule TAKE 1 CAPSULE BY  MOUTH EVERY DAY BEFORE BREAKFAST 30 capsule 3  . methylPREDNISolone (MEDROL DOSEPAK) 4 MG TBPK tablet As directed 21 tablet 0  . montelukast (SINGULAIR) 10 MG tablet TAKE 1 TABLET BY MOUTH EVERYDAY AT BEDTIME 90 tablet 1  . mupirocin cream (BACTROBAN) 2 % Apply 1 application topically 2 (two) times daily. 30 g 3  . naproxen (NAPROSYN) 500 MG tablet Take 1 tablet (500 mg total) by mouth 2 (two) times daily with a meal. 60 tablet 3  . norethindrone-ethinyl estradiol (FYAVOLV) 1-5 MG-MCG TABS tablet Take by mouth daily.    . Olmesartan-amLODIPine-HCTZ 40-5-12.5 MG TABS Take 1 tablet by mouth daily.    . pantoprazole (PROTONIX) 40 MG tablet TAKE 1 TABLET BY MOUTH EVERY DAY 90 tablet 1  . phentermine (ADIPEX-P) 37.5 MG tablet Take 1 tablet (37.5 mg total) by mouth daily before breakfast. 30 tablet 2  . Potassium Chloride ER 20 MEQ TBCR TAKE 1 TABLET (20 MEQ) BY MOUTH DAILY. ANNUAL APPT DUE IN AUGUST MUST SEE PROVIDER FOR FUTURE REFILL 90 tablet 0  . Prenatal Vit-Fe Fumarate-FA (M-NATAL PLUS) 27-1 MG TABS TAKE 1 TABLET BY MOUTH EVERY DAY 90 tablet 1  . pseudoephedrine (SUDAFED) 120 MG 12 hr tablet Take 1 tablet (120 mg total) by mouth 2 (two) times daily as needed for congestion. 20 tablet 2  . rizatriptan (MAXALT)  10 MG tablet TAKE 1 TABLET BY MOUTH ONCE AS NEEDED FOR UP TO 1 DOSE FOR MIGRAINE. MAY REPEAT IN 2 HOURS 12 tablet 5  . tirzepatide (MOUNJARO) 2.5 MG/0.5ML Pen Inject 2.5 mg into the skin once a week. 2 mL 2  . triamcinolone (KENALOG) 0.1 % paste Use as directed 1 application. in the mouth or throat 3 (three) times daily. On canker sores 5 g 1  . valACYclovir (VALTREX) 1000 MG tablet Take 1 tablet (1,000 mg total) by mouth 3 (three) times daily. 21 tablet 1  . Vitamin D, Ergocalciferol, (DRISDOL) 1.25 MG (50000 UNIT) CAPS capsule Take 1 capsule (50,000 Units total) by mouth every 7 (seven) days. 5 capsule 0  . Semaglutide, 2 MG/DOSE, 8 MG/3ML SOPN Inject 2 mg as directed once a week. 3 mL 1    No facility-administered medications prior to visit.    ROS: Review of Systems  Constitutional:  Negative for activity change, appetite change, chills, fatigue and unexpected weight change.  HENT:  Negative for congestion, mouth sores and sinus pressure.   Eyes:  Negative for visual disturbance.  Respiratory:  Positive for shortness of breath. Negative for cough and chest tightness.   Cardiovascular:  Positive for leg swelling.  Gastrointestinal:  Negative for abdominal pain and nausea.  Genitourinary:  Negative for difficulty urinating, frequency and vaginal pain.  Musculoskeletal:  Negative for back pain and gait problem.  Skin:  Negative for pallor and rash.  Neurological:  Negative for dizziness, tremors, weakness, numbness and headaches.  Psychiatric/Behavioral:  Negative for confusion and sleep disturbance.     Objective:  BP (!) 138/96 (BP Location: Left Arm, Patient Position: Sitting, Cuff Size: Normal)   Pulse 98   Temp 98.2 F (36.8 C) (Oral)   Ht 5\' 3"  (1.6 m)   Wt 257 lb (116.6 kg)   SpO2 92%   BMI 45.53 kg/m   BP Readings from Last 3 Encounters:  06/22/23 (!) 138/96  05/29/23 (!) 144/98  05/13/23 126/86    Wt Readings from Last 3 Encounters:  06/22/23 257 lb (116.6 kg)  05/29/23 253 lb 6 oz (114.9 kg)  05/13/23 251 lb (113.9 kg)    Physical Exam Constitutional:      General: She is not in acute distress.    Appearance: She is well-developed.  HENT:     Head: Normocephalic.     Right Ear: External ear normal.     Left Ear: External ear normal.     Nose: Nose normal.  Eyes:     General:        Right eye: No discharge.        Left eye: No discharge.     Conjunctiva/sclera: Conjunctivae normal.     Pupils: Pupils are equal, round, and reactive to light.  Neck:     Thyroid: No thyromegaly.     Vascular: No JVD.     Trachea: No tracheal deviation.  Cardiovascular:     Rate and Rhythm: Normal rate and regular rhythm.     Heart sounds: Normal  heart sounds.  Pulmonary:     Effort: No respiratory distress.     Breath sounds: No stridor. No wheezing.  Abdominal:     General: Bowel sounds are normal. There is no distension.     Palpations: Abdomen is soft. There is no mass.     Tenderness: There is no abdominal tenderness. There is no guarding or rebound.  Musculoskeletal:        General:  No tenderness.     Cervical back: Normal range of motion and neck supple. No rigidity.  Lymphadenopathy:     Cervical: No cervical adenopathy.  Skin:    Findings: No erythema or rash.  Neurological:     Cranial Nerves: No cranial nerve deficit.     Motor: No abnormal muscle tone.     Coordination: Coordination normal.     Deep Tendon Reflexes: Reflexes normal.  Psychiatric:        Behavior: Behavior normal.        Thought Content: Thought content normal.        Judgment: Judgment normal.    Lab Results  Component Value Date   WBC 4.3 03/20/2023   HGB 12.8 03/20/2023   HCT 40.1 03/20/2023   PLT 157.0 03/20/2023   GLUCOSE 101 (H) 03/20/2023   CHOL 170 05/13/2023   TRIG 85.0 05/13/2023   HDL 53.80 05/13/2023   LDLCALC 99 05/13/2023   ALT 22 02/23/2023   AST 22 02/23/2023   NA 135 03/20/2023   K 3.6 03/20/2023   CL 101 03/20/2023   CREATININE 0.59 03/20/2023   BUN 11 03/20/2023   CO2 23 03/20/2023   TSH 0.63 03/20/2023   INR 0.9 RATIO 03/15/2007   HGBA1C 6.2 05/13/2023    [place] FNA BX THYROID 1ST LESION AFIRMA  Result Date: 03/24/2023 INDICATION: Indeterminate thyroid nodule EXAM: ULTRASOUND GUIDED FINE NEEDLE ASPIRATION OF INDETERMINATE THYROID NODULE COMPARISON:  February 24, 2023 MEDICATIONS: None COMPLICATIONS: None immediate. TECHNIQUE: Informed written consent was obtained from the patient after a discussion of the risks, benefits and alternatives to treatment. Questions regarding the procedure were encouraged and answered. A timeout was performed prior to the initiation of the procedure. Pre-procedural ultrasound scanning  demonstrated unchanged size and appearance of the indeterminate nodule within the left thyroid lobe. The procedure was planned. The neck was prepped in the usual sterile fashion, and a sterile drape was applied covering the operative field. A timeout was performed prior to the initiation of the procedure. Local anesthesia was provided with 1% lidocaine. Under direct ultrasound guidance, x5 FNA biopsies were performed of the nodule in the left thyroid lobe with a 25 gauge needle. Multiple ultrasound images were saved for procedural documentation purposes. The samples were prepared and submitted to pathology. Limited post procedural scanning was negative for hematoma or additional complication. Dressings were placed. The patient tolerated the above procedures procedure well without immediate postprocedural complication. FINDINGS: Nodule reference number based on prior diagnostic ultrasound: 6 Maximum size: 3.6 cm Location: Left; Mid ACR TI-RADS risk category: TR4 (4-6 points) Reason for biopsy: meets ACR TI-RADS criteria Ultrasound imaging confirms appropriate placement of the needles within the thyroid nodule. IMPRESSION: Technically successful ultrasound guided fine needle aspiration of thyroid nodule in the left thyroid lobe. Electronically Signed   By: Olive Bass M.D.   On: 03/24/2023 15:45    Assessment & Plan:   Problem List Items Addressed This Visit     OBESITY, MORBID WITH STARTING BMI 44    Starting Mounjaro soon Surgery f/u - Dr Fredonia Highland      Essential hypertension - Primary    Worse We started Coreg Lasix po      Relevant Medications   Olmesartan-amLODIPine-HCTZ 40-5-12.5 MG TABS   furosemide (LASIX) 40 MG tablet      Meds ordered this encounter  Medications  . furosemide (LASIX) 40 MG tablet    Sig: Take 1 tablet (40 mg total) by mouth daily as needed.  Dispense:  30 tablet    Refill:  5      Follow-up: No follow-ups on file.  Sonda Primes, MD

## 2023-06-23 DIAGNOSIS — I5189 Other ill-defined heart diseases: Secondary | ICD-10-CM | POA: Insufficient documentation

## 2023-06-23 NOTE — Assessment & Plan Note (Signed)
Echo results were reviewed with the patient.  We started Neal on Coreg.

## 2023-06-23 NOTE — Assessment & Plan Note (Signed)
Continue with vitamin B12 

## 2023-06-23 NOTE — Assessment & Plan Note (Signed)
Multifactorial.  Primarily related to morbid obesity, restrictive lung disease, goiter We will continue with our weight loss effort -starting Mounjaro, bariatric surgery

## 2023-06-26 NOTE — Telephone Encounter (Signed)
Error

## 2023-06-29 ENCOUNTER — Other Ambulatory Visit: Payer: Self-pay | Admitting: Internal Medicine

## 2023-06-29 ENCOUNTER — Telehealth: Payer: Self-pay

## 2023-06-29 NOTE — Telephone Encounter (Signed)
Pharmacy Patient Advocate Encounter   Received notification from CoverMyMeds that prior authorization for [facility] 2.5MG /0.5ML auto-injectors is required/requested.   Insurance verification completed.   The patient is insured through [facility] .   Per test claim: PA required; PA submitted to above mentioned insurance via CoverMyMeds Key/confirmation #/EOC [id number] Status is pending

## 2023-06-30 ENCOUNTER — Telehealth: Payer: Self-pay | Admitting: Internal Medicine

## 2023-06-30 NOTE — Telephone Encounter (Signed)
Pharmacy Patient Advocate Encounter  Received notification from [facility] that Prior Authorization for Mounjaro 2.5MG /0.5ML auto-injectors  has been DENIED.  Full denial letter will be uploaded to the media tab. See denial reason below.   PA #/Case ID/Reference #:  [id number]

## 2023-06-30 NOTE — Telephone Encounter (Signed)
Pt called  wanting to speak with the nurse about her mounjaro and what is her next steps. Please advise.

## 2023-07-01 NOTE — Telephone Encounter (Signed)
Pt mounjaro was denied and she is wanting to know what is next for her to do or try.

## 2023-07-07 ENCOUNTER — Other Ambulatory Visit: Payer: Self-pay | Admitting: Internal Medicine

## 2023-07-07 NOTE — Telephone Encounter (Signed)
I am sorry.  Did they offer the alternative? Follow-up with Dr. Andrey Campanile to discuss surgery. Thank you

## 2023-07-07 NOTE — Telephone Encounter (Signed)
You said that Greggory Keen was denied, correct?

## 2023-07-07 NOTE — Telephone Encounter (Signed)
Pt stated she is needing a new rx written for the next higher dosage of Mounjaro so I can start the PA process for her.

## 2023-07-07 NOTE — Telephone Encounter (Signed)
Pt is currently in Dr. Andrey Campanile office getting them to do the PA paperwork to see if it gets approved.

## 2023-07-09 MED ORDER — TIRZEPATIDE 5 MG/0.5ML ~~LOC~~ SOAJ
5.0000 mg | SUBCUTANEOUS | 3 refills | Status: DC
Start: 2023-07-09 — End: 2024-02-17

## 2023-07-09 NOTE — Telephone Encounter (Signed)
Okay.  Thanks.

## 2023-07-09 NOTE — Addendum Note (Signed)
Addended by: Tresa Garter on: 07/09/2023 07:33 AM   Modules accepted: Orders

## 2023-07-20 ENCOUNTER — Encounter: Payer: Self-pay | Admitting: Internal Medicine

## 2023-07-20 NOTE — Telephone Encounter (Signed)
 Care team updated and letter sent for eye exam notes.

## 2023-07-22 ENCOUNTER — Telehealth: Payer: Self-pay

## 2023-07-22 NOTE — Telephone Encounter (Signed)
Copied from CRM [id number]. Topic: Clinical - Medication Question >> Jul 22, 2023 10:28 AM Almira Coaster wrote: Reason for CRM: Patient is calling regarding her tirzepatide [facility]) 5 MG/0.5ML Pen prescription that was denied by the insurance. She would like to know what are the next steps and why was it denied.

## 2023-07-23 NOTE — Telephone Encounter (Signed)
I am not sure what she can do.   She can get Zepbound from the [employer] in vials for $399 or $549 depending on the dose per month.  We have a form Thanks

## 2023-07-24 ENCOUNTER — Telehealth: Payer: Self-pay

## 2023-07-24 NOTE — Telephone Encounter (Signed)
Copied from CRM [id number]. Topic: Clinical - Prescription Issue >> Jul 24, 2023 10:36 AM Denese Killings wrote: Reason for CRM: Maralyn Sago with [employer] that they needs a Prior Authorization order sent over urgently with patient's A1C levels for the order of [facility]).

## 2023-07-28 ENCOUNTER — Other Ambulatory Visit: Payer: Self-pay | Admitting: Internal Medicine

## 2023-07-31 ENCOUNTER — Other Ambulatory Visit: Payer: Self-pay | Admitting: Internal Medicine

## 2023-08-12 ENCOUNTER — Telehealth: Payer: Self-pay

## 2023-08-12 NOTE — Telephone Encounter (Signed)
   Pre-operative Risk Assessment    Patient Name: [name]  DOB: 08/30/1970 MRN: [id number]   Date of last office visit: None Date of next office visit: None   Request for Surgical Clearance    Procedure:   Laparoscopic Roux-en-y Gastic Bypass   Date of Surgery:  Clearance TBD                                Surgeon:  Dr. Camellia Blush  Surgeon's Group or Practice Name:  [facility] Phone number:  [phone] Fax number:  [phone]   Type of Clearance Requested:   - Medical    Type of Anesthesia:  General    Additional requests/questions:    SignedJolan Jariah Jarmon   08/12/2023, 4:31 PM

## 2023-08-13 ENCOUNTER — Other Ambulatory Visit (HOSPITAL_COMMUNITY): Payer: Self-pay | Admitting: General Surgery

## 2023-08-13 NOTE — Telephone Encounter (Signed)
   Name: [name]  DOB: [birth year]  MRN: [id number]  Primary Cardiologist: None  Chart reviewed as part of pre-operative protocol coverage. Because of [name]'s past medical history and time since last visit, she will require a follow-up in-office visit in order to better assess preoperative cardiovascular risk.  Patient has a new patient office visit scheduled on 10/01/2023 with Dr. Kate. Appointment notes have been updated to reflect need for pre-op evaluation.   Pre-op covering staff:  - Please contact requesting surgeon's office via preferred method (i.e, phone, fax) to inform them of need for appointment prior to surgery.   Barnie Hila, NP  08/13/2023, 1:02 PM

## 2023-08-17 ENCOUNTER — Encounter: Payer: 59 | Attending: General Surgery | Admitting: Dietician

## 2023-08-17 ENCOUNTER — Encounter: Payer: Self-pay | Admitting: Dietician

## 2023-08-17 VITALS — Ht 63.0 in | Wt 260.7 lb

## 2023-08-17 DIAGNOSIS — E669 Obesity, unspecified: Secondary | ICD-10-CM | POA: Insufficient documentation

## 2023-08-17 NOTE — Progress Notes (Addendum)
 Nutrition Assessment for Bariatric Surgery: Pre-Surgery Behavioral and Nutrition Intervention Program   Medical Nutrition Therapy  Appt Start Time: 0814    End Time: 0919  Patient was seen on 08/17/2023 for Pre-Operative Nutrition Assessment. Purpose of todays visit  enhance perioperative outcomes along with a healthy weight maintenance   Referral stated Supervised Weight Loss (SWL) visits needed: 3 months  Planned surgery: RYGB Pt expectation of surgery: to be off medications  NUTRITION ASSESSMENT   Anthropometrics  Start weight at [facility]: 260.7 lbs (date: 08/17/2023)  Height: 63 in BMI: 46.18 kg/m2     Clinical   Pharmacotherapy: History of weight loss medication used: Ozempic  (not tolerated well), phentermine   Medical hx: obesity, HTN Medications: potassium chloride , olmesartan  amlodipine  rizatriptan , alprazolam , hydroxyzine , carvedilol , linzess , pantoprazole , inhaler  Labs: creatinine 0.59, A1c 6.2 Notable signs/symptoms: none noted Any previous deficiencies? No  Evaluation of Nutritional Deficiencies: Micronutrient Nutrition Focused Physical Exam: Hair: No issues observed Eyes: No issues observed Mouth: No issues observed Neck: No issues observed Nails: No issues observed Skin: No issues observed  Lifestyle & Dietary Hx Pt states she gets winded, stating she has a rapid heart rate, due to her left ventricle not relaxing all the way. Current Physical Activity Recommendations state 150 minutes per week of moderate to vigorous movement including Cardio and 1-2 days of resistance activities as well as flexibility/balance activities:  Pts current physical activity: ADLs (walking at work), with 0% recommendation reached   Sleep Hygiene: duration and quality: sleep but states she is tired when she gets up.  Current Patient Perceived Stress Level as stated by pt on a scale of 1-10:  7       Stress Management Techniques: squeeze ball used in huddle room at work.  According  to the Dietary Guidelines for Americans Recommendation: equivalent 1.5-2 cups fruits per day, equivalent 2-3 cups vegetables per day and at least half all grains whole  Fruit servings per day (on average): 0, meeting 0% recommendation  Non-starchy vegetable servings per day (on average): 0, meeting 0% recommendation  Whole Grains per day (on average): 1  Number of meals missed/skipped per week out of 21: 7  24-Hr Dietary Recall First Meal: oatmeal, coffee (half decaf/mushroom coffee) or protein shake Snack: nabs Second Meal: skip or lunch late (chicken and pinto beans) or cafeteria would be a grilled chicken sandwich Snack:  Third Meal: shrimp, spinach with onions Snack: 100 calorie cookies pack (two packs) Beverages: coffee (half and half with mushroom coffee, water   Alcoholic beverages per week: 0   Estimated Energy Needs Calories: 1500  NUTRITION DIAGNOSIS  Overweight/obesity ([place]-3.3) related to past poor dietary habits and physical inactivity as evidenced by patient w/ planned RYGB surgery following dietary guidelines for continued weight loss.  NUTRITION INTERVENTION  Nutrition counseling (C-1) and education (E-2) to facilitate bariatric surgery goals.  Educated pt on micronutrient deficiencies post-surgery and behavioral/dietary strategies to start in order to mitigate that risk   Behavioral and Dietary Interventions Pre-Op Goals Reviewed with the Patient Nutrition: Healthy Eating Behaviors Switch to non-caloric, non-carbonated and non-caffeinated beverages such as  water , unsweetened tea, Crystal Light and zero calorie beverages (aim for 64 oz. per day) Cut out grazing between meals or at night  Find a protein shake you like Eat every 3-5 hours        Eliminate distractions while eating (TV, computer, reading, driving, texting) Take 79-69 minutes to eat a meal  Decrease high sugar foods/decrease high fat/fried foods Eliminate alcoholic beverages Increase protein  intake  (eggs, fish, chicken, yogurt) before surgery Eat non starchy vegetables 2 times a day 7 days a week Eat complex carbohydrates such as whole grains and fruits   Behavioral Modification: Physical Activity Increase my usual daily activity (use stairs, park farther, etc.) Engage in _______________________  activity  _______ minutes ______ times per week  Other:    _________________________________________________________________     Problem Solving I will think about my usual eating patterns and how to tweak them How can my friends and family support me Barriers to starting my changes Learn and understand appetite verses hunger   Healthy Coping Allow for ___________ activities per week to help me manage stress Reframe negative thoughts I will keep a picture of someone or something that is my inspiration & look at it daily   Monitoring  Weigh myself once a week  Measure my progress by monitoring how my clothes fit Keep a food record of what I eat and drink for the next ________ (time period) Take pictures of what I eat and drink for the next ________ (time period) Use an app to count steps/day for the next_______ (time period) Measure my progress such as increased energy and more restful sleep Monitor your acid reflux and bowel habits, are they getting better?   *Goals that are bolded indicate the pt would like to start working towards these  Handouts Provided Include  Bariatric Surgery handouts (Nutrition Visits, Pre Surgery Behavioral Change Goals, Protein Shakes Brands to Choose From, Vitamins & Mineral Supplementation)  Learning Style & Readiness for Change Teaching method utilized: Visual, Auditory, and hands on  Demonstrated degree of understanding via: Teach Back  Readiness Level: preparation Barriers to learning/adherence to lifestyle change: nothing identified  RD's Notes for Next Visit Patient progress toward chosen goals.    MONITORING & EVALUATION Dietary intake,  weekly physical activity, body weight, and preoperative behavioral change goals   Next Steps  Patient is to follow up in two weeks at [facility] for first SWL visit.

## 2023-08-19 ENCOUNTER — Other Ambulatory Visit: Payer: Self-pay | Admitting: Internal Medicine

## 2023-08-19 ENCOUNTER — Other Ambulatory Visit: Payer: Self-pay

## 2023-08-23 ENCOUNTER — Other Ambulatory Visit: Payer: Self-pay | Admitting: Internal Medicine

## 2023-08-24 ENCOUNTER — Encounter (HOSPITAL_COMMUNITY)
Admission: RE | Admit: 2023-08-24 | Discharge: 2023-08-24 | Disposition: A | Discharge status: Home / Self Care | Payer: 59 | Source: Ambulatory Visit | Attending: General Surgery

## 2023-08-24 ENCOUNTER — Ambulatory Visit (HOSPITAL_COMMUNITY)
Admission: RE | Admit: 2023-08-24 | Discharge: 2023-08-24 | Disposition: A | Discharge status: Home / Self Care | Payer: 59 | Source: Ambulatory Visit | Attending: General Surgery | Admitting: General Surgery

## 2023-08-24 ENCOUNTER — Other Ambulatory Visit: Payer: Self-pay

## 2023-08-24 DIAGNOSIS — Z01818 Encounter for other preprocedural examination: Secondary | ICD-10-CM

## 2023-08-26 ENCOUNTER — Other Ambulatory Visit: Payer: Self-pay | Admitting: Internal Medicine

## 2023-09-03 ENCOUNTER — Encounter: Payer: Self-pay | Admitting: Dietician

## 2023-09-03 ENCOUNTER — Encounter: Payer: 59 | Attending: General Surgery | Admitting: Dietician

## 2023-09-03 VITALS — Ht 63.0 in | Wt 260.3 lb

## 2023-09-03 DIAGNOSIS — E1165 Type 2 diabetes mellitus with hyperglycemia: Secondary | ICD-10-CM | POA: Insufficient documentation

## 2023-09-03 DIAGNOSIS — E669 Obesity, unspecified: Secondary | ICD-10-CM | POA: Insufficient documentation

## 2023-09-03 NOTE — Progress Notes (Signed)
Supervised Weight Loss Visit Bariatric Nutrition Education Appt Start Time: 1614    End Time: 1645  Planned surgery: RYGB Pt expectation of surgery: to be off medications  1 out of 3 SWL Appointments   NUTRITION ASSESSMENT   Anthropometrics  Start weight at [facility]: 260.7 lbs (date: 08/17/2023)  Height: 63 in Weight: 260.3 lb BMI: 46.11 kg/m2     Clinical   Pharmacotherapy: History of weight loss medication used: Ozempic (not tolerated well), phentermine  Medical hx: obesity, HTN Medications: potassium chloride, olmesartan amlodipine rizatriptan, alprazolam, hydroxyzine, carvedilol, linzess, pantoprazole, inhaler, prenatal vitamin Labs: creatinine 0.59, A1c 6.2, glucose 104 Notable signs/symptoms: none noted Any previous deficiencies? No  Lifestyle & Dietary Hx  Pt states she is trying to eat throughout the day, stating she is doing a little better. Pt states she is getting 3-4 meals/snacks per day. Pt states she is practicing not drinking with meals.  Estimated daily fluid intake: 68 oz Supplements: vit D3, prenatal vitamin Current average weekly physical activity: ADLs  24-Hr Dietary Recall First Meal: oatmeal, coffee (half decaf/mushroom coffee) or protein shake Snack: nabs Second Meal: skip or lunch late (chicken and pinto beans) or cafeteria would be a grilled chicken sandwich Snack:  Third Meal: shrimp, spinach with onions Snack: 100 calorie cookies pack (two packs) Beverages: coffee (half and half with mushroom coffee, water  Alcoholic beverages per week: 0   Estimated Energy Needs Calories: 1500  NUTRITION DIAGNOSIS  Overweight/obesity ([place]-3.3) related to past poor dietary habits and physical inactivity as evidenced by patient w/ planned RYGB surgery following dietary guidelines for continued weight loss.  NUTRITION INTERVENTION  Nutrition counseling (C-1) and education (E-2) to facilitate bariatric surgery goals.  Purpose of protein: Every cell in your  body has protein. Protein is essential for the structure, function and regulation of tissues and organs within the body. Without protein enzymes and antibodies would not exist, and cells would lack storage, transportation, and messenger systems. According to [facility]. [employer], the body is made up of at least 10000 different proteins. Lack of protein can lead to growth failure in children, loss of muscle mass, decreased immune system function, and overall weakening of various organs in the body.  [employer]nl, [employer].com.cy, http://galloway.com/ Pre-Op Goals Reviewed with the Patient  Pre-Op Goals Progress & New Goals Continue: practicing not drinking with meals Continue: avoiding skipping meals New: aim for 60 grams of protein per day from food; less from protein shakes; use the bariatric MyPlate method  Handouts Provided Include  Bariatric MyPlate  Learning Style & Readiness for Change Teaching method utilized: Visual & Auditory  Demonstrated degree of understanding via: Teach Back  Readiness Level: preparation Barriers to learning/adherence to lifestyle change: nothing identified  RD's Notes for next Visit  Patient progress toward chosen goals  MONITORING & EVALUATION Dietary intake, weekly physical activity, body weight, and pre-op goals in 1 month.   Next Steps  Patient is to return to [facility] in 1 month for next SWL visit.

## 2023-09-18 ENCOUNTER — Other Ambulatory Visit (HOSPITAL_COMMUNITY): Payer: Self-pay

## 2023-09-23 ENCOUNTER — Telehealth: Payer: Self-pay | Admitting: Pharmacy Technician

## 2023-09-23 ENCOUNTER — Other Ambulatory Visit (HOSPITAL_COMMUNITY): Payer: Self-pay

## 2023-09-23 NOTE — Telephone Encounter (Signed)
Pharmacy Patient Advocate Encounter   Received notification from CoverMyMeds that prior authorization for [facility] 5MG /0.5ML PEN is required/requested.   Insurance verification completed.   The patient is insured through [facility] .   Per test claim: The current 84 day co-pay is, $225.00.  No PA needed at this time. This test claim was processed through [facility]- copay amounts may vary at other pharmacies due to pharmacy/plan contracts, or as the patient moves through the different stages of their insurance plan.    COPAY $25.00 AFTER EVOUCHER

## 2023-09-29 ENCOUNTER — Ambulatory Visit (HOSPITAL_COMMUNITY): Payer: Self-pay | Admitting: Licensed Clinical Social Worker

## 2023-09-29 NOTE — Progress Notes (Unsigned)
 Cardiology Office Note:    Date:  10/01/2023   ID:  [name], DOB 1971-07-26, MRN [id number]  PCP:  Tresa Garter, MD  Cardiologist:  Little Ishikawa, MD  Electrophysiologist:  None   Referring MD: Gaynelle Adu, MD   Chief Complaint  Patient presents with   Shortness of Breath    History of Present Illness:    [name] is a 53 y.o. female with a hx of fibromyalgia, hypertension, morbid obesity who is referred for preop evaluation by Dr. Posey Rea.  Planning gastric bypass surgery.  Denies any chest pain.  Does report she is having dyspnea with exertion, states that walking flight of stairs causes her to be short of breath.  She denies any lightheadedness or syncope.  Does report some lower extremity edema, takes Lasix as needed, states only needs to take about once per month.  She denies any palpitations.  Echocardiogram 06/17/2023 showed EF 50 to 55%, grade 2 diastolic dysfunction, normal RV function, no significant valvular disease.    Past Medical History:  Diagnosis Date   Allergy    rhinitis   Anemia, unspecified    Anxiety    Asthma    asthma   Back pain    Colitis    Fibromyalgia    Hypertension    Joint pain    Lactose intolerance    Leiomyoma of uterus, unspecified    Meralgia paresthetica    Nausea alone    Obesity    Other B-complex deficiencies    Pre-diabetes    Stomach ulcer    Umbilical hernia    Unspecified vitamin D deficiency     Past Surgical History:  Procedure Laterality Date   BREAST REDUCTION SURGERY     fibriodidectomy  2009   MYOMECTOMY  2004   x 2    Current Medications: Current Meds  Medication Sig   Albuterol-Budesonide (AIRSUPRA) 90-80 MCG/ACT AERO Inhale 2 Inhalations into the lungs every 4 (four) hours as needed.   ALPRAZolam (XANAX) 1 MG tablet TAKE 1 TABLET (1 MG TOTAL) BY MOUTH 3 TIMES/DAY AS NEEDED-BETWEEN MEALS & BEDTIME FOR ANXIETY   carvedilol (COREG) 6.25 MG tablet Take 1 tablet (6.25 mg  total) by mouth 2 (two) times daily with a meal.   chlorhexidine (HIBICLENS) 4 % external liquid Apply topically daily as needed. Use 3/week   cyclobenzaprine (FLEXERIL) 10 MG tablet TAKE 1 TABLET BY MOUTH EVERYDAY AT BEDTIME   furosemide (LASIX) 40 MG tablet Take 1 tablet (40 mg total) by mouth daily as needed.   hydrOXYzine (VISTARIL) 25 MG capsule 1-2 BY MOUTH AT BEDTIME AS NEEDED   KLOR-CON M20 20 MEQ tablet TAKE 1 TABLET BY MOUTH TWICE A DAY   LINZESS 290 MCG CAPS capsule TAKE 1 CAPSULE BY MOUTH EVERY DAY BEFORE BREAKFAST   mupirocin cream (BACTROBAN) 2 % Apply 1 application topically 2 (two) times daily.   naproxen (NAPROSYN) 500 MG tablet TAKE 1 TABLET BY MOUTH 2 TIMES DAILY WITH A MEAL.   norethindrone-ethinyl estradiol (FYAVOLV) 1-5 MG-MCG TABS tablet Take by mouth daily.   Olmesartan-amLODIPine-HCTZ 40-5-12.5 MG TABS TAKE 1 TABLET BY MOUTH EVERY DAY   pantoprazole (PROTONIX) 40 MG tablet TAKE 1 TABLET BY MOUTH EVERY DAY   Prenatal Vit-Fe Fumarate-FA (M-NATAL PLUS) 27-1 MG TABS TAKE 1 TABLET BY MOUTH EVERY DAY   pseudoephedrine (SUDAFED) 120 MG 12 hr tablet Take 1 tablet (120 mg total) by mouth 2 (two) times daily as needed for congestion.   rizatriptan (MAXALT)  10 MG tablet TAKE 1 TABLET BY MOUTH ONCE AS NEEDED FOR UP TO 1 DOSE FOR MIGRAINE. MAY REPEAT IN 2 HOURS   tirzepatide (MOUNJARO) 5 MG/0.5ML Pen Inject 5 mg into the skin once a week.   Vitamin D, Ergocalciferol, (DRISDOL) 1.25 MG (50000 UNIT) CAPS capsule Take 1 capsule (50,000 Units total) by mouth every 7 (seven) days.     Allergies:   Amlodipine   Social History   Socioeconomic History   Marital status: Single    Spouse name: Not on file   Number of children: 0   Years of education: Not on file   Highest education level: Not on file  Occupational History   Occupation: Set designer: [employer]   Occupation: Furniture conservator/restorer  Tobacco Use   Smoking status: Never    Passive exposure: Never   Smokeless  tobacco: Never  Vaping Use   Vaping status: Never Used  Substance and Sexual Activity   Alcohol use: No   Drug use: No   Sexual activity: Not on file  Other Topics Concern   Not on file  Social History Narrative   Not on file   Social Drivers of Health   Financial Resource Strain: Low Risk  (10/09/2021)   Received from [facility], [facility]   Overall Financial Resource Strain (CARDIA)    Difficulty of Paying Living Expenses: Not hard at all  Food Insecurity: No Food Insecurity (10/09/2021)   Received from [facility], [facility]   Hunger Vital Sign    Worried About Running Out of Food in the Last Year: Never true    Ran Out of Food in the Last Year: Never true  Transportation Needs: No Transportation Needs (10/09/2021)   Received from [facility], [facility]   PRAPARE - Transportation    Lack of Transportation (Medical): No    Lack of Transportation (Non-Medical): No  Physical Activity: Unknown (10/09/2021)   Received from [facility], [facility]   Exercise Vital Sign    Days of Exercise per Week: 0 days    Minutes of Exercise per Session: Not on file  Stress: No Stress Concern Present (10/09/2021)   Received from [facility], [facility] - Occupational Stress Questionnaire    Feeling of Stress : Only a little  Social Connections: Unknown (12/03/2021)   Received from [facility], [facility]   Social Network    Social Network: Not on file     Family History: The patient's family history includes Alcoholism in her father; Diabetes in her paternal grandmother; High blood pressure in her father; Hypertension in her father and another family member. There is no history of Esophageal cancer, Colon cancer, or Stomach cancer.  ROS:   Please see the history of present illness.     All other systems reviewed and are negative.  EKGs/Labs/Other Studies Reviewed:    The following studies were reviewed  today:   EKG:   10/01/23: NSR, rate 81, nonspecific T wave flattening  Recent Labs: 02/23/2023: ALT 22 03/20/2023: BUN 11; Creatinine, Ser 0.59; Hemoglobin 12.8; Platelets 157.0; Potassium 3.6; Sodium 135; TSH 0.63 05/13/2023: Pro B Natriuretic peptide (BNP) 16.0  Recent Lipid Panel    Component Value Date/Time   CHOL 170 05/13/2023 1355   CHOL 170 04/01/2022 1025   TRIG 85.0 05/13/2023 1355   HDL 53.80 05/13/2023 1355   HDL 55 04/01/2022 1025   CHOLHDL 3 05/13/2023 1355   VLDL  17.0 05/13/2023 1355   LDLCALC 99 05/13/2023 1355   LDLCALC 102 (H) 04/01/2022 1025    Physical Exam:    VS:  BP 126/72 (BP Location: Left Arm, Patient Position: Sitting, Cuff Size: Normal)   Pulse 81   Ht 5\' 3"  (1.6 m)   Wt 264 lb 6.4 oz (119.9 kg)   SpO2 95%   BMI 46.84 kg/m     Wt Readings from Last 3 Encounters:  10/01/23 264 lb 6.4 oz (119.9 kg)  09/03/23 260 lb 4.8 oz (118.1 kg)  08/17/23 260 lb 11.2 oz (118.3 kg)     GEN:  Well nourished, well developed in no acute distress HEENT: Normal NECK: No JVD; No carotid bruits LYMPHATICS: No lymphadenopathy CARDIAC: RRR, no murmurs, rubs, gallops RESPIRATORY:  Clear to auscultation without rales, wheezing or rhonchi  ABDOMEN: Soft, non-tender, non-distended MUSCULOSKELETAL:  Trace edema; No deformity  SKIN: Warm and dry NEUROLOGIC:  Alert and oriented x 3 PSYCHIATRIC:  Normal affect   ASSESSMENT:    1. Pre-op evaluation   2. SOB (shortness of breath)   3. Daytime somnolence   4. Essential hypertension   5. Chronic diastolic heart failure (HCC)   6. Morbid obesity (HCC)    PLAN:    Preop evaluation/DOE: Prior to gastric bypass surgery.  She is reporting dyspnea with exertion, states that feels short of breath walking flight of stairs.  Echocardiogram 06/2023 showed EF 50 to 55%, grade 2 diastolic dysfunction, normal RV function, no significant valvular disease. -Recommend stress PET for further evaluation  Hypertension: On carvedilol  6.25 mg twice daily and olmesartan-amlodipine-HCTZ 40-5-12.5  Chronic diastolic heart failure: Echocardiogram 06/17/2023 showed EF 50 to 55%, grade 2 diastolic dysfunction, normal RV function, no significant valvular disease.  On Lasix 40 mg daily as needed  Daytime somnolence: Recommend [facility] sleep study.  STOP-BANG 4  Morbid obesity: Body mass index is 46.84 kg/m.  Being evaluated for gastric bypass surgery as above   RTC in 6 months  Informed Consent   Shared Decision Making/Informed Consent The risks [chest pain, shortness of breath, cardiac arrhythmias, dizziness, blood pressure fluctuations, myocardial infarction, stroke/transient ischemic attack, nausea, vomiting, allergic reaction, radiation exposure, metallic taste sensation and life-threatening complications (estimated to be 1 in 10,000)], benefits (risk stratification, diagnosing coronary artery disease, treatment guidance) and alternatives of a cardiac PET stress test were discussed in detail with Ms. Dowty and she agrees to proceed.      Medication Adjustments/Labs and Tests Ordered: Current medicines are reviewed at length with the patient today.  Concerns regarding medicines are outlined above.  Orders Placed This Encounter  Procedures   NM PET CT CARDIAC PERFUSION MULTI W/ABSOLUTE BLOODFLOW   Cardiac Stress Test: Informed Consent Details: Physician/Practitioner Attestation; Transcribe to consent form and obtain patient signature   EKG 12-Lead   [facility] Sleep Study   No orders of the defined types were placed in this encounter.   Patient Instructions  Medication Instructions:  Your physician recommends that you continue on your current medications as directed. Please refer to the Current Medication list given to you today.  *If you need a refill on your cardiac medications before your next appointment, please call your pharmacy*   Lab Work: None If you have labs (blood work) drawn today and your tests are  completely normal, you will receive your results only by: MyChart Message (if you have MyChart) OR A paper copy in the mail If you have any lab test that is abnormal or we  need to change your treatment, we will call you to review the results.   Testing/Procedures: WatchPAT? is an FDA-cleared portable home sleep study test that uses a watch and 3 points of contact to monitor 7 different channels, including your heart rate, oxygen saturation, body position, snoring, and chest motion.  The study is easy to use from the comfort of your own home and accurately detect sleep apnea.  Before bed, you attach the chest sensor, attached the sleep apnea bracelet to your nondominant hand, and attach the finger probe.  After the study, the raw data is downloaded from the watch and scored for apnea events.   For more information: [email or web address]  Patient Testing Instructions:  Once our office has received insurance approval for you to complete this test, we will contact you with a PIN to activate the device.  This typically takes 2-3 weeks.  Please do not open the box until approved.   Do not put battery into the device until bedtime when you are ready to begin the test. Please call the support number if you need assistance after following the instructions below: 24 hour support line- [phone] or [facility] support at [phone] (option 2)  Download the [employer] One" app through the [employer] or [employer]. Be sure to turn on or enable access to Bluetooth in settings on your smartphone. Make sure no other Bluetooth devices are on and within the vicinity of your smartphone and WatchPAT watch during testing.  Make sure to leave your smart phone plugged in and charging all night.  When ready for bed:  Follow the instructions step by step in the WatchPAT One app to activate the testing device. For additional instructions, including video instruction, visit the WatchPAT  One video on Youtube. You can search for WatchPAT One within Youtube (video is 4 minutes and 18 seconds) or enter: https://youtube/[email or web address]?[email or web address] Please note: You will be prompted to enter a PIN to connect via Bluetooth when starting the test. The PIN will be assigned to you after insurance has approved the test.  The device is disposable, but it recommended that you retain the device until you receive a call letting you know the study has been received and the results have been interpreted.  We will let you know if the study did not transmit to [place] properly after the test is completed. You do not need to call us to confirm the receipt of the test.  Please complete the test within 48 hours of receiving PIN.   Frequently Asked Questions:  What is Watch PAT One?  A single use, fully disposable home sleep apnea testing device and will not need to be returned after completion.  What are the requirements to use WatchPAT One?  A successful WatchPAT One sleep study requires a WatchPAT One device, your smart phone, WatchPAT One app, your PIN number, and internet access. What type of phone do I need?  You should have a smart phone that uses Android 5.1 and above or any iPhone with IOS 10 and above. How can I download the WatchPAT one app?  Based on your device type search for WatchPAT One app either in [employer] for [employer] or [employer] for [employer]. Where will I get my PIN for the study?  Your PIN will be provided by your physician's office after insurance has approved the test. This process typically takes 2-3 weeks. It is used for authentication and if you lose/forget your PIN, please reach  out to your provider's office.  I do not have internet at home. Can I still complete a WatchPAT One study?  WatchPAT One needs internet connection throughout the night to be able to transmit the sleep data. You can use your home/local internet or your cellular data package. However, it is  always recommended to use home/local internet. It is estimated that between 20MB-30MB of data will be used with each study, but the application will be looking for space in the phone to start the study.  What happens if I lose internet or Bluetooth connection?  During the internet disconnection, your phone will not be able to transmit the sleep data.  All the data, will be stored in your phone.  As soon as the internet connection is back on, the phone will resume sending the sleep data. During the Bluetooth disconnection, WatchPAT One will not be able to to send the sleep data to your phone.  Data will be kept in the WatchPAT One until both devices have Bluetooth connection back on.  As soon as the connection is back on, WatchPAT one will send the sleep data to the phone.  How long do I need to wear the WatchPAT one?  After you start the study, you should wear the device at least 6 hours.  How far should I keep my phone from the device?  During the night, your phone should remain within 15 feet of where you sleep.  What happens if I leave the room for restroom or other reasons?  Leaving the room for any reason will not cause any problem. As soon as your get back to the room, both devices will reconnect and will continue to send the sleep data. Can I use my phone during the sleep study?  Yes, you can use your phone as usual during the study. But it is recommended to put your WatchPAT One on when you are ready to go to bed.  How will I get my study results?  A soon as you completed your study, your sleep data will be sent to the provider. They will then share the results with you when they are ready.       Please report to Radiology at the [facility] Main Entrance 30 minutes early for your test.  [address] [place], [place] [address]  How to Prepare for Your Cardiac PET/CT Stress Test:  Nothing to eat or drink, except water, 3 hours prior to arrival time.  NO  caffeine/decaffeinated products, or chocolate 12 hours prior to arrival. (Please note decaffeinated beverages (teas/coffees) still contain caffeine).  If you have caffeine within 12 hours prior, the test will need to be rescheduled.  Medication instructions: Do not take erectile dysfunction medications for 72 hours prior to test.  Do not take nitrates (isosorbide mononitrate, Ranexa) the day before or day of test Do not take tamsulosin the day before or morning of test Hold theophylline containing medications for 12 hours. Hold Dipyridamole 48 hours prior to the test.  Diabetic Preparation: If able to eat breakfast prior to 3 hour fasting, you may take all medications, including your insulin. Do not worry if you miss your breakfast dose of insulin - start at your next meal. If you do not eat prior to 3 hour fast-Hold all diabetes (oral and insulin) medications. Patients who wear a continuous glucose monitor MUST remove the device prior to scanning.  You may take your remaining medications with water.  NO perfume,  cologne or lotion on chest or abdomen area. FEMALES - Please avoid wearing dresses to this appointment.  Total time is 1 to 2 hours; you may want to bring reading material for the waiting time.  IF YOU THINK YOU MAY BE PREGNANT, OR ARE NURSING PLEASE INFORM THE TECHNOLOGIST.  In preparation for your appointment, medication and supplies will be purchased.  Appointment availability is limited, so if you need to cancel or reschedule, please call the Radiology Department Scheduler at [phone] 24 hours in advance to avoid a cancellation fee of $100.00  What to Expect When you Arrive:  Once you arrive and check in for your appointment, you will be taken to a preparation room within the Radiology Department.  A technologist or Nurse will obtain your medical history, verify that you are correctly prepped for the exam, and explain the procedure.  Afterwards, an IV will be started in  your arm and electrodes will be placed on your skin for EKG monitoring during the stress portion of the exam. Then you will be escorted to the PET/CT scanner.  There, staff will get you positioned on the scanner and obtain a blood pressure and EKG.  During the exam, you will continue to be connected to the EKG and blood pressure machines.  A small, safe amount of a radioactive tracer will be injected in your IV to obtain a series of pictures of your heart along with an injection of a stress agent.    After your Exam:  It is recommended that you eat a meal and drink a caffeinated beverage to counter act any effects of the stress agent.  Drink plenty of fluids for the remainder of the day and urinate frequently for the first couple of hours after the exam.  Your doctor will inform you of your test results within 7-10 business days.  For more information and frequently asked questions, please visit our website: [email or web address]  For questions about your test or how to prepare for your test, please call: Cardiac Imaging Nurse Navigators Office: [phone]    Follow-Up: At [facility], you and your health needs are our priority.  As part of our continuing mission to provide you with exceptional heart care, we have created designated Provider Care Teams.  These Care Teams include your primary Cardiologist (physician) and Advanced Practice Providers (APPs -  Physician Assistants and Nurse Practitioners) who all work together to provide you with the care you need, when you need it.     Your next appointment:   6 month(s)  Provider:   Lisbeth Renshaw, MD      Signed, Little Ishikawa, MD  10/01/2023 1:13 PM    [facility]

## 2023-10-01 ENCOUNTER — Other Ambulatory Visit: Payer: Self-pay | Admitting: *Deleted

## 2023-10-01 ENCOUNTER — Encounter: Payer: Self-pay | Admitting: Cardiology

## 2023-10-01 ENCOUNTER — Encounter: Payer: Self-pay | Admitting: Dietician

## 2023-10-01 ENCOUNTER — Encounter: Payer: 59 | Attending: General Surgery | Admitting: Dietician

## 2023-10-01 ENCOUNTER — Ambulatory Visit: Payer: 59 | Attending: Cardiology | Admitting: Cardiology

## 2023-10-01 VITALS — Ht 63.0 in | Wt 264.3 lb

## 2023-10-01 VITALS — BP 126/72 | HR 81 | Ht 63.0 in | Wt 264.4 lb

## 2023-10-01 DIAGNOSIS — R0602 Shortness of breath: Secondary | ICD-10-CM | POA: Diagnosis not present

## 2023-10-01 DIAGNOSIS — R400 Somnolence: Secondary | ICD-10-CM | POA: Diagnosis not present

## 2023-10-01 DIAGNOSIS — E669 Obesity, unspecified: Secondary | ICD-10-CM | POA: Diagnosis present

## 2023-10-01 DIAGNOSIS — I5032 Chronic diastolic (congestive) heart failure: Secondary | ICD-10-CM

## 2023-10-01 DIAGNOSIS — E1165 Type 2 diabetes mellitus with hyperglycemia: Secondary | ICD-10-CM | POA: Diagnosis present

## 2023-10-01 DIAGNOSIS — I10 Essential (primary) hypertension: Secondary | ICD-10-CM

## 2023-10-01 DIAGNOSIS — Z01818 Encounter for other preprocedural examination: Secondary | ICD-10-CM

## 2023-10-01 NOTE — Patient Instructions (Signed)
 Medication Instructions:  Your physician recommends that you continue on your current medications as directed. Please refer to the Current Medication list given to you today.  *If you need a refill on your cardiac medications before your next appointment, please call your pharmacy*   Lab Work: None If you have labs (blood work) drawn today and your tests are completely normal, you will receive your results only by: MyChart Message (if you have MyChart) OR A paper copy in the mail If you have any lab test that is abnormal or we need to change your treatment, we will call you to review the results.   Testing/Procedures: WatchPAT? is an FDA-cleared portable home sleep study test that uses a watch and 3 points of contact to monitor 7 different channels, including your heart rate, oxygen saturation, body position, snoring, and chest motion.  The study is easy to use from the comfort of your own home and accurately detect sleep apnea.  Before bed, you attach the chest sensor, attached the sleep apnea bracelet to your nondominant hand, and attach the finger probe.  After the study, the raw data is downloaded from the watch and scored for apnea events.   For more information: [email or web address]  Patient Testing Instructions:  Once our office has received insurance approval for you to complete this test, we will contact you with a PIN to activate the device.  This typically takes 2-3 weeks.  Please do not open the box until approved.   Do not put battery into the device until bedtime when you are ready to begin the test. Please call the support number if you need assistance after following the instructions below: 24 hour support line- [phone] or [facility] support at [phone] (option 2)  Download the [facility]" app through the [employer] or [employer]. Be sure to turn on or enable access to Bluetooth in settings on your smartphone. Make sure no other  Bluetooth devices are on and within the vicinity of your smartphone and WatchPAT watch during testing.  Make sure to leave your smart phone plugged in and charging all night.  When ready for bed:  Follow the instructions step by step in the WatchPAT One app to activate the testing device. For additional instructions, including video instruction, visit the WatchPAT One video on Youtube. You can search for WatchPAT One within Youtube (video is 4 minutes and 18 seconds) or enter: https://youtube/[email or web address] Please note: You will be prompted to enter a PIN to connect via Bluetooth when starting the test. The PIN will be assigned to you after insurance has approved the test.  The device is disposable, but it recommended that you retain the device until you receive a call letting you know the study has been received and the results have been interpreted.  We will let you know if the study did not transmit to [place] properly after the test is completed. You do not need to call us to confirm the receipt of the test.  Please complete the test within 48 hours of receiving PIN.   Frequently Asked Questions:  What is Watch PAT One?  A single use, fully disposable home sleep apnea testing device and will not need to be returned after completion.  What are the requirements to use WatchPAT One?  A successful WatchPAT One sleep study requires a WatchPAT One device, your smart phone, WatchPAT One app, your PIN number, and internet access. What type of phone do I need?  You should have  a smart phone that uses Android 5.1 and above or any iPhone with IOS 10 and above. How can I download the WatchPAT one app?  Based on your device type search for WatchPAT One app either in [employer] for [employer] or [employer] for [employer]. Where will I get my PIN for the study?  Your PIN will be provided by your physician's office after insurance has approved the test. This process typically takes 2-3 weeks. It  is used for authentication and if you lose/forget your PIN, please reach out to your provider's office.  I do not have internet at home. Can I still complete a WatchPAT One study?  WatchPAT One needs internet connection throughout the night to be able to transmit the sleep data. You can use your home/local internet or your cellular data package. However, it is always recommended to use home/local internet. It is estimated that between 20MB-30MB of data will be used with each study, but the application will be looking for space in the phone to start the study.  What happens if I lose internet or Bluetooth connection?  During the internet disconnection, your phone will not be able to transmit the sleep data.  All the data, will be stored in your phone.  As soon as the internet connection is back on, the phone will resume sending the sleep data. During the Bluetooth disconnection, WatchPAT One will not be able to to send the sleep data to your phone.  Data will be kept in the WatchPAT One until both devices have Bluetooth connection back on.  As soon as the connection is back on, WatchPAT one will send the sleep data to the phone.  How long do I need to wear the WatchPAT one?  After you start the study, you should wear the device at least 6 hours.  How far should I keep my phone from the device?  During the night, your phone should remain within 15 feet of where you sleep.  What happens if I leave the room for restroom or other reasons?  Leaving the room for any reason will not cause any problem. As soon as your get back to the room, both devices will reconnect and will continue to send the sleep data. Can I use my phone during the sleep study?  Yes, you can use your phone as usual during the study. But it is recommended to put your WatchPAT One on when you are ready to go to bed.  How will I get my study results?  A soon as you completed your study, your sleep data will be sent to the provider.  They will then share the results with you when they are ready.       Please report to Radiology at the [facility] Main Entrance 30 minutes early for your test.  [address] [place], [place] [address]  How to Prepare for Your Cardiac PET/CT Stress Test:  Nothing to eat or drink, except water, 3 hours prior to arrival time.  NO caffeine/decaffeinated products, or chocolate 12 hours prior to arrival. (Please note decaffeinated beverages (teas/coffees) still contain caffeine).  If you have caffeine within 12 hours prior, the test will need to be rescheduled.  Medication instructions: Do not take erectile dysfunction medications for 72 hours prior to test.  Do not take nitrates (isosorbide mononitrate, Ranexa) the day before or day of test Do not take tamsulosin the day before or morning of test Hold  theophylline containing medications for 12 hours. Hold Dipyridamole 48 hours prior to the test.  Diabetic Preparation: If able to eat breakfast prior to 3 hour fasting, you may take all medications, including your insulin. Do not worry if you miss your breakfast dose of insulin - start at your next meal. If you do not eat prior to 3 hour fast-Hold all diabetes (oral and insulin) medications. Patients who wear a continuous glucose monitor MUST remove the device prior to scanning.  You may take your remaining medications with water.  NO perfume, cologne or lotion on chest or abdomen area. FEMALES - Please avoid wearing dresses to this appointment.  Total time is 1 to 2 hours; you may want to bring reading material for the waiting time.  IF YOU THINK YOU MAY BE PREGNANT, OR ARE NURSING PLEASE INFORM THE TECHNOLOGIST.  In preparation for your appointment, medication and supplies will be purchased.  Appointment availability is limited, so if you need to cancel or reschedule, please call the Radiology Department Scheduler at [phone] 24 hours in advance to avoid a cancellation  fee of $100.00  What to Expect When you Arrive:  Once you arrive and check in for your appointment, you will be taken to a preparation room within the Radiology Department.  A technologist or Nurse will obtain your medical history, verify that you are correctly prepped for the exam, and explain the procedure.  Afterwards, an IV will be started in your arm and electrodes will be placed on your skin for EKG monitoring during the stress portion of the exam. Then you will be escorted to the PET/CT scanner.  There, staff will get you positioned on the scanner and obtain a blood pressure and EKG.  During the exam, you will continue to be connected to the EKG and blood pressure machines.  A small, safe amount of a radioactive tracer will be injected in your IV to obtain a series of pictures of your heart along with an injection of a stress agent.    After your Exam:  It is recommended that you eat a meal and drink a caffeinated beverage to counter act any effects of the stress agent.  Drink plenty of fluids for the remainder of the day and urinate frequently for the first couple of hours after the exam.  Your doctor will inform you of your test results within 7-10 business days.  For more information and frequently asked questions, please visit our website: [email or web address]  For questions about your test or how to prepare for your test, please call: Cardiac Imaging Nurse Navigators Office: [phone]    Follow-Up: At [facility], you and your health needs are our priority.  As part of our continuing mission to provide you with exceptional heart care, we have created designated Provider Care Teams.  These Care Teams include your primary Cardiologist (physician) and Advanced Practice Providers (APPs -  Physician Assistants and Nurse Practitioners) who all work together to provide you with the care you need, when you need it.     Your next appointment:   6  month(s)  Provider:   Lisbeth Renshaw, MD

## 2023-10-01 NOTE — Addendum Note (Signed)
 Addended by: Epifanio Lesches on: 10/01/2023 05:31 PM   Modules accepted: Orders

## 2023-10-01 NOTE — Progress Notes (Signed)
 Supervised Weight Loss Visit Bariatric Nutrition Education Appt Start Time: 1700    End Time: 1731  Planned surgery: RYGB Pt expectation of surgery: to be off medications  2 out of 3 SWL Appointments   NUTRITION ASSESSMENT   Anthropometrics  Start weight at [facility]: 260.7 lbs (date: 08/17/2023)  Height: 63 in Weight: 264.3 lb BMI: 46.82 kg/m2     Clinical   Pharmacotherapy: History of weight loss medication used: Ozempic (not tolerated well), phentermine  Medical hx: obesity, HTN Medications: potassium chloride, olmesartan amlodipine rizatriptan, alprazolam, hydroxyzine, carvedilol, linzess, pantoprazole, inhaler, prenatal vitamin Labs: creatinine 0.59, A1c 6.2, glucose 104 Notable signs/symptoms: none noted Any previous deficiencies? No  Lifestyle & Dietary Hx  Pt states she had an appointment with the cardiologist today. Pt states she gets shortness of breath, stating she is getting a stress test with the cardiologist. Also stating she is doing a sleep test. Pt states it is hectic at work, stating she gets about 60 oz of water a day. Pt states she has been getting 3 meals per day. Pt states she tracked her protein some, stating she was getting about 40 grams per day.  Estimated daily fluid intake: 60 oz Supplements: vit D3, prenatal vitamin Current average weekly physical activity: ADLs at work (walking)  24-Hr Dietary Recall First Meal: oatmeal, coffee (half decaf/mushroom coffee) or protein shake Snack: nabs Second Meal: skip or lunch late (chicken and pinto beans) or cafeteria would be a grilled chicken sandwich Snack:  Third Meal: shrimp, spinach with onions or tuna with quinoa. Snack: 100 calorie cookies pack (two packs) Beverages: coffee (half and half with mushroom coffee, water  Alcoholic beverages per week: 0   Estimated Energy Needs Calories: 1500  NUTRITION DIAGNOSIS  Overweight/obesity ([place]-3.3) related to past poor dietary habits and physical  inactivity as evidenced by patient w/ planned RYGB surgery following dietary guidelines for continued weight loss.  NUTRITION INTERVENTION  Nutrition counseling (C-1) and education (E-2) to facilitate bariatric surgery goals. Pre-Op Goals Reviewed with the Patient Why you need complex carbohydrates: Whole grains and other complex carbohydrates are required to have a healthy diet. Whole grains provide fiber which can help with blood glucose levels and help keep you satiated. Fruits and starchy vegetables provide essential vitamins and minerals required for immune function, eyesight support, brain support, bone density, wound healing and many other functions within the body. According to the current evidenced based 2020-2025 Dietary Guidelines for Americans, complex carbohydrates are part of a healthy eating pattern which is associated with a decreased risk for type 2 diabetes, cancers, and cardiovascular disease.  Encouraged pt to continue to eat balanced meals inclusive of non starchy vegetables 2 times a day 7 days a week Encouraged pt to continue to drink a minium 64 fluid ounces with half being plain water to satisfy proper hydration    Pre-Op Goals Progress & New Goals Continue: practicing not drinking with meals Continue: avoiding skipping meals Re-engage: aim for 60 grams of protein per day from food; less from protein shakes; use the bariatric MyPlate method; aim for protein with every meal or snack. New: increase non-starchy vegetables; aim for 2 or more servings per day.  Handouts Provided Include  Goals printed  Learning Style & Readiness for Change Teaching method utilized: Visual & Auditory  Demonstrated degree of understanding via: Teach Back  Readiness Level: preparation Barriers to learning/adherence to lifestyle change: nothing identified  RD's Notes for next Visit  Patient progress toward chosen goals  MONITORING &  EVALUATION Dietary intake, weekly physical activity, body  weight, and pre-op goals in 1 month.   Next Steps  Patient is to return to [facility] in 1 month for next SWL visit.

## 2023-10-15 ENCOUNTER — Ambulatory Visit (HOSPITAL_COMMUNITY): Payer: 59 | Admitting: Licensed Clinical Social Worker

## 2023-10-15 DIAGNOSIS — F4320 Adjustment disorder, unspecified: Secondary | ICD-10-CM

## 2023-10-16 NOTE — Progress Notes (Signed)
 Comprehensive Clinical Assessment (CCA) Note  10/16/2023 [name] [id number]  Chief Complaint:  Chief Complaint  Patient presents with   Obesity   Visit Diagnosis: Adjustment disorder, unspecified type    CCA Biopsychosocial Intake/Chief Complaint:  Bariatric psych evaluation  Current Symptoms/Problems: No psychosis, no HI/SI, gained around 40 lbs, difficulty with falling asleep, weight makes it hard to enjoy things and it has added to her aches and pains   Patient Reported Schizophrenia/Schizoaffective Diagnosis in Past: No   Strengths: very personable, people personable  Preferences: doesn't prefer drama or conflict, prefers being with friends and family,  Abilities: Manufacturing systems engineer   Type of Services Patient Feels are Needed: Bariatric surgery   Initial Clinical Notes/Concerns: History of obesity: Patient had breast reduction and was on ozempic and she started to have issues with weight,  Weight loss attempts: ozempic, phentermine, topamx, weight watchers, personal trainer, intermittent fasting, smoothies,  Current diet: increasing protein, increasing vegetables and complex carbs, increasing water intake, reducing soda,  Co-morbid: high blood pressure, pre-diabetic,  Previous procedures: Breast reduction-Nov 22, recovered well,  Family history of obesity: None   Mental Health Symptoms Depression:  Sleep (too much or little); Irritability   Duration of Depressive symptoms: Greater than two weeks   Mania:  None   Anxiety:   None   Psychosis:  None   Duration of Psychotic symptoms: No data recorded  Trauma:  None   Obsessions:  None   Compulsions:  None   Inattention:  None   Hyperactivity/Impulsivity:  None   Oppositional/Defiant Behaviors:  None   Emotional Irregularity:  None   Other Mood/Personality Symptoms:  None    Mental Status Exam Appearance and self-care  Stature:  Average   Weight:  Obese   Clothing:  Casual   Grooming:   Normal   Cosmetic use:  Age appropriate   Posture/gait:  Normal   Motor activity:  Not Remarkable   Sensorium  Attention:  Normal   Concentration:  Normal   Orientation:  X5   Recall/memory:  Normal   Affect and Mood  Affect:  Appropriate   Mood:  Euthymic   Relating  Eye contact:  Normal   Facial expression:  Responsive   Attitude toward examiner:  Cooperative   Thought and Language  Speech flow: Normal   Thought content:  Appropriate to Mood and Circumstances   Preoccupation:  None   Hallucinations:  None   Organization:  No data recorded  [employer] of Knowledge:  Good   Intelligence:  Average   Abstraction:  Normal   Judgement:  Normal   Reality Testing:  Adequate   Insight:  Good   Decision Making:  Normal   Social Functioning  Social Maturity:  Responsible   Social Judgement:  Normal   Stress  Stressors:  Work   Coping Ability:  Normal   Skill Deficits:  None   Supports:  Family     Religion: Religion/Spirituality Are You A Religious Person?: Yes What is Your Religious Affiliation?: Christian How Might This Affect Treatment?: Support in treatment  Leisure/Recreation: Leisure / Recreation Do You Have Hobbies?: Yes Leisure and Hobbies: shop, spend time with mother  Exercise/Diet: Exercise/Diet Do You Exercise?: No Have You Gained or Lost A Significant Amount of Weight in the Past Six Months?: Yes-Gained Number of Pounds Gained: 40 Do You Follow a Special Diet?: Yes Type of Diet: See above Do You Have Any Trouble Sleeping?: Yes Explanation of Sleeping Difficulties: Difficulty falling asleep  at time   CCA Employment/Education Employment/Work Situation: Employment / Work Situation Employment Situation: Employed Where is Patient Currently Employed?: [employer] Long has Patient Been Employed?: 19 years Are You Satisfied With Your Job?: Yes Do You Work More Than One Job?: No Work Stressors:  New changes and projects, as well as getting people trained Patient's Job has Been Impacted by Current Illness: No What is the Longest Time Patient has Held a Job?: 19 Where was the Patient Employed at that Time?: [employer] Has Patient ever Been in the 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 Did You Have Any Special Interests In School?: None Did You Have An Individualized Education Program (IIEP): No Did You Have Any Difficulty At School?: No Patient's Education Has Been Impacted by Current Illness: No   CCA Family/Childhood History Family and Relationship History: Family history Marital status: Single Are you sexually active?: No What is your sexual orientation?: Heterosexual Has your sexual activity been affected by drugs, alcohol, medication, or emotional stress?: None Does patient have children?: No  Childhood History:  Childhood History By whom was/is the patient raised?: Mother, Grandparents Additional childhood history information: Mother and Grandparents. Father was in her life occasionally. Patient describes childhood as "good childhood, felt supported." Description of patient's relationship with caregiver when they were a child: Mother: good, Grandparents: good, Father: limited Patient's description of current relationship with people who raised him/her: Mother: good, Father: deceased, Grandparents: deceased How were you disciplined when you got in trouble as a child/adolescent?: spanked Does patient have siblings?: No Did patient suffer any verbal/emotional/physical/sexual abuse as a child?: No Did patient suffer from severe childhood neglect?: No Has  patient ever been sexually abused/assaulted/raped as an adolescent or adult?: No Was the patient ever a victim of a crime or a disaster?: No Witnessed domestic violence?: No Has patient been affected by domestic violence as an adult?: No  Child/Adolescent Assessment:     CCA Substance Use Alcohol/Drug Use: Alcohol / Drug Use Pain Medications: See patient MAR Prescriptions: See patient MAR Over the Counter: See patient MAR History of alcohol / drug use?: No history of alcohol / drug abuse                         ASAM's:  Six Dimensions of Multidimensional Assessment  Dimension 1:  Acute Intoxication and/or Withdrawal Potential:   Dimension 1:  Description of individual's past and current experiences of substance use and withdrawal: None  Dimension 2:  Biomedical Conditions and Complications:   Dimension 2:  Description of patient's biomedical conditions and  complications: None  Dimension 3:  Emotional, Behavioral, or Cognitive Conditions and Complications:  Dimension 3:  Description of emotional, behavioral, or cognitive conditions and complications: None  Dimension 4:  Readiness to Change:  Dimension 4:  Description of Readiness to Change criteria: None  Dimension 5:  Relapse, Continued use, or Continued Problem Potential:  Dimension 5:  Relapse, continued use, or continued problem potential critiera description: None  Dimension 6:  Recovery/Living Environment:  Dimension 6:  Recovery/Iiving environment criteria description: None  ASAM Severity Score: ASAM's Severity Rating Score: 0  ASAM Recommended Level of Treatment:  Substance use Disorder (SUD)    Recommendations for Services/Supports/Treatments: Recommendations for Services/Supports/Treatments Recommendations For Services/Supports/Treatments: Other (Comment) (Bariatric surgery)  DSM5 Diagnoses: Patient Active Problem List   Diagnosis Date Noted   Diastolic dysfunction 06/23/2023   OSA (obstructive sleep  apnea) 05/29/2023   Laryngopharyngeal reflux (LPR) 05/19/2023   Dyslipidemia, goal LDL below 100 05/13/2023   Bilateral leg edema 05/13/2023   Type 2 diabetes mellitus with hyperglycemia (HCC) 02/24/2023   Goiter, nodular 02/19/2023   Hyperglycemia 02/19/2023   Weight gain 02/19/2023   Acute dysfunction of left eustachian tube 02/09/2023   Stress at work 11/24/2022   Emotional stress 09/22/2022   Poor sleep 07/24/2022   Irregular periods 07/23/2022   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   Gastroesophageal reflux disease 06/04/2022   Breast wound 05/23/2022   Shoulder bursitis 04/23/2022   DOE (dyspnea on exertion) 04/01/2022   Gastritis 04/01/2022   Other constipation 04/01/2022   Abdominal bloating 04/01/2022   Anxiety disorder 01/16/2022   Aphthous ulcer of mouth 01/02/2022   Palpitations 12/20/2021   Dizziness 12/20/2021   Constipation 12/20/2021   Abdominal distention 12/13/2021   COVID-19 03/06/2021   Symptomatic mammary hypertrophy 02/26/2021   Achilles tendinitis 02/20/2021   Fibromyalgia 12/25/2020   Prediabetes 10/05/2020   Large breasts 04/25/2020   Lumbar radiculopathy 04/25/2020   Sciatica 03/05/2020   Boil 01/23/2020   Situational depression 02/21/2019   Conjunctivitis 01/03/2019   Carpal tunnel syndrome, bilateral 11/23/2018   Migraine headache without aura 06/03/2018   Shortness of breath on exertion 10/07/2017   Abnormal TSH 09/30/2017   Periscapular pain 09/04/2017   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   Hydradenitis 03/25/2017   Abdominal pain 10/06/2016   Need for tuberculosis vaccination 07/02/2016   Heavy periods 04/21/2016   Neck pain 01/02/2016   Acute upper respiratory infection 09/11/2015   Superficial phlebitis 08/11/2014   Reaction, adjustment, with depressed mood, brief 03/17/2014   Sinusitis, acute 08/24/2013   Need for prophylactic vaccination and inoculation against other combinations of diseases 12/16/2012   Boil, face 11/22/2012   RLQ  abdominal pain 10/21/2012   Fever 10/21/2012   Hypokalemia 10/21/2012   Colitis, acute 10/21/2012   Right ovarian cyst 10/21/2012   Adrenal nodule (HCC) 10/21/2012   Noninfective gastroenteritis and colitis, unspecified 10/21/2012   Fatigue 07/08/2012   Well adult exam 03/31/2012   TACHYCARDIA 10/01/2010   HYPERGLYCEMIA 03/04/2010   Pain in joint 01/18/2010   Low back pain 01/18/2010   MERALGIA PARESTHETICA 12/18/2009   Alopecia 10/05/2009   CYSTITIS, ACUTE 06/15/2009   CONTACT DERMATITIS&OTH ECZEMA DUE OTH SPEC AGENT 04/06/2009   Genital herpes simplex 08/04/2008   OBESITY, MORBID WITH STARTING BMI 44 06/23/2008   Asthmatic bronchitis 06/05/2008   PYOGENIC GRANULOMA 04/17/2008   LEG PAIN 01/14/2008   PARESTHESIA 01/14/2008   Essential hypertension 12/17/2007   Allergic rhinitis 12/17/2007   Dyspnea 07/12/2007   Vitamin D deficiency 06/30/2007   Nausea 06/30/2007   B12 deficiency 06/11/2007   Unspecified Iron Deficiency Anemia 06/11/2007   FIBROIDS, UTERUS 03/13/2007   Anemia 03/13/2007    Patient Centered Plan: Patient is on the following Treatment Plan(s):  No treatment plan needed  Behavioral Health Assessment Patient Name [name] Date of Birth Mar 02, 1971  Age 22 y.o.  Date of Interview 10/15/2023   Gender female  Date of Report 03.14.2025  Purpose Bariatric/Weight-loss Surgery (pre-operative evaluation)     Assessment Instruments:  DSM-5-TR Self-Rated Level 1 Cross-Cutting Symptom Measure--Adult Severity Measure for Generalized Anxiety Disorder--Adult EAT-26  Chief  Complain: Obesity  Client Background: Patient is a 53 y.o. African American female  seeking weight loss surgery. Patient has an associates in Retail buyer and works at [employer] and [employer].  Patient is single. The patient is feet  inches tall and lbs., placing her at a BMI of  classifying her in the obese range and at further risk of co-morbid diseases.  Weight History:  Patient had a breast  reduction and was on ozempic. She then started to have issues with her weight increasing. Patient has tried ozempic, phentermine, topamax, weight watchers, having a personal trainer, intermittent fasting, ad smoothies to lose weight with limited success.   Eating Patterns:  Patient is focusing on high protein, low carb. She has increased her water intake, and is working on reducing soda. She doesn't drink much soda but knows she will not be able to have it post op.   Related Medical Issues:    Patient has been diagnosed with high blood pressure and pre-diabetic. Patient has had a breast reduction in Nov 2022 and recovered well.   Family History of Obesity:  No family history of obesity   Tobacco Use: Patient denies tobacco use.   PATIENT BEHAVIORAL ASSESSMENT SCORES  Personal History of Mental Illness: Patient denies treatment for depression and anxiety.   Mental Status Examination: Patient was oriented x5 (person, place, situation, time, and object). She was appropriately groomed, and neatly dressed. Patient was alert, engaged, pleasant, and cooperative. Patient denies suicidal and homicidal ideations. Patient denies self-injury. Patient denies psychosis including auditory and visual hallucinations  DSM-5-TR Self-Rated Level 1 Cross-Cutting Symptom Measure--Adult:  Patient rated herself a 2 on the Depression domain on the statement "little interest or pleasure in doing things." Patient noted that her weight makes it difficult to do and enjoy things.   Severity Measure for Generalized Anxiety Disorder--Adult: Patient completed a 10-question scale. Total scores can range from 0 to 40. A raw score is calculated by summing the answer to each question, and an average total score is achieved by dividing the raw score by the number of items (e.g., 10). Patient had a total raw score of 2 out of 40 which was divided by the total number of questions answered (10) to get an average score of . 2 which  indicates no clinically significant anxiety.   EAT-26: The EAT-26 is a twenty-six-question screening tool to identify symptoms of eating disorders and disordered eating. The patient scored 2 out of 26. Scores below a 20 are considered not meeting criteria for disordered eating. Patient denies inducing vomiting, or intentional meal skipping. Patient denies binge eating behaviors. Patient denies laxative abuse. Patient does not meet criteria for a DSM-V eating disorder.  Conclusion & Recommendations:   [name] health history and current assessment indicate that she is suitable for bariatric surgery. Patient understands the procedure, the risks associated with it, and the importance of post-operative holistic care (Physical, Spiritual/Values, Relationships, and Mental/Emotional health) with access to resources for support as needed. The patient has made an informed decision to proceed with the procedure. The patient is motivated and expressed understanding of the post-surgical requirements. Patient's psychological assessment will be valid from today's date for 6 months (09.13.2025). Then, a follow-up appointment will be needed to re-evaluate the patient's psychological status.   I see no significant psychological factors that would hinder the success of bariatric surgery. I support [name] desire for Bariatric Surgery.   Bynum Bellows, LCSW    Referrals to Alternative Service(s): Referred to Alternative  Service(s):   Place:   Date:   Time:    Referred to Alternative Service(s):   Place:   Date:   Time:    Referred to Alternative Service(s):   Place:   Date:   Time:    Referred to Alternative Service(s):   Place:   Date:   Time:      Collaboration of Care: Other provider involved in patient's care [facility]  Patient/Guardian was advised Release of Information must be obtained prior to any record release in order to collaborate their care with an outside provider.  Patient/Guardian was advised if they have not already done so to contact the registration department to sign all necessary forms in order for [place] to release information regarding their care.   Consent: Patient/Guardian gives verbal consent for treatment and assignment of benefits for services provided during this visit. Patient/Guardian expressed understanding and agreed to proceed.   Bynum Bellows, LCSW

## 2023-10-26 ENCOUNTER — Other Ambulatory Visit: Payer: Self-pay | Admitting: Internal Medicine

## 2023-10-26 ENCOUNTER — Other Ambulatory Visit: Payer: Self-pay | Admitting: Gastroenterology

## 2023-10-26 DIAGNOSIS — Z1211 Encounter for screening for malignant neoplasm of colon: Secondary | ICD-10-CM

## 2023-10-26 DIAGNOSIS — D128 Benign neoplasm of rectum: Secondary | ICD-10-CM

## 2023-10-26 DIAGNOSIS — R140 Abdominal distension (gaseous): Secondary | ICD-10-CM

## 2023-10-28 ENCOUNTER — Other Ambulatory Visit (HOSPITAL_COMMUNITY): Payer: Self-pay

## 2023-10-30 ENCOUNTER — Encounter (HOSPITAL_COMMUNITY): Payer: Self-pay

## 2023-11-02 ENCOUNTER — Encounter: Payer: Self-pay | Admitting: Dietician

## 2023-11-02 ENCOUNTER — Encounter: Payer: 59 | Attending: General Surgery | Admitting: Dietician

## 2023-11-02 ENCOUNTER — Telehealth: Payer: Self-pay | Admitting: Cardiology

## 2023-11-02 VITALS — Ht 63.0 in | Wt 264.9 lb

## 2023-11-02 DIAGNOSIS — E1165 Type 2 diabetes mellitus with hyperglycemia: Secondary | ICD-10-CM | POA: Diagnosis present

## 2023-11-02 DIAGNOSIS — E669 Obesity, unspecified: Secondary | ICD-10-CM | POA: Diagnosis present

## 2023-11-02 NOTE — Telephone Encounter (Signed)
 Ordering provider: Bjorn Pippin Associated diagnoses: Daytime Somnolence WatchPAT PA obtained on 11/02/2023 by Brunetta Genera, LPN. Authorization: Yes; tracking ID  [id number]. Patient notified of PIN (1234) on 11/02/2023 via Notification Method: phone.

## 2023-11-02 NOTE — Telephone Encounter (Signed)
 Patient states she is awaiting activation code for sleep apnea ?watch. Please advise.

## 2023-11-02 NOTE — Progress Notes (Signed)
 Supervised Weight Loss Visit Bariatric Nutrition Education Appt Start Time: 1653    End Time: 1712  Planned surgery: RYGB Pt expectation of surgery: to be off medications  3 out of 3 SWL Appointments   Pt completed visits.   Pt has cleared nutrition requirements.   NUTRITION ASSESSMENT   Anthropometrics  Start weight at [facility]: 260.7 lbs (date: 08/17/2023)  Height: 63 in Weight: 264.9 lb BMI: 46.92 kg/m2     Clinical   Pharmacotherapy: History of weight loss medication used: Ozempic (not tolerated well), phentermine  Medical hx: obesity, HTN Medications: potassium chloride, olmesartan amlodipine rizatriptan, alprazolam, hydroxyzine, carvedilol, linzess, pantoprazole, inhaler, prenatal vitamin Labs: creatinine 0.59, A1c 6.2, glucose 104 Notable signs/symptoms: none noted Any previous deficiencies? No  Lifestyle & Dietary Hx  Pt states she has been trying to practice not drinking with meals and snacks. Pt states she is trying to increase protein intake, stating she is getting 60 grams per day. Pt states she has done well getting protein and fluids, stating she is reaching her goals. Pt states she has cut her portions down and cut back on fried foods. Pt states she needs to make sure she keeps up with the habits she as created, and focus on nutrient dense foods, avoiding sugar.  Estimated daily fluid intake: 64 oz Supplements: vit D3, prenatal vitamin Current average weekly physical activity: ADLs at work (walking)  24-Hr Dietary Recall First Meal: oatmeal, coffee (half decaf/mushroom coffee) or protein shake Snack: nabs Second Meal: skip or lunch late (chicken and pinto beans) or cafeteria would be a grilled chicken sandwich Snack:  Third Meal: shrimp, spinach with onions or tuna with quinoa. Snack: 100 calorie cookies pack (two packs) Beverages: coffee (half and half with mushroom coffee), water, Gatorade zero, cranberry juice (zero sugar)  Alcoholic beverages per  week: 0   Estimated Energy Needs Calories: 1500  NUTRITION DIAGNOSIS  Overweight/obesity ([place]-3.3) related to past poor dietary habits and physical inactivity as evidenced by patient w/ planned RYGB surgery following dietary guidelines for continued weight loss.  NUTRITION INTERVENTION  Nutrition counseling (C-1) and education (E-2) to facilitate bariatric surgery goals. Pre-Op Goals Reviewed with the Patient Why you need complex carbohydrates: Whole grains and other complex carbohydrates are required to have a healthy diet. Whole grains provide fiber which can help with blood glucose levels and help keep you satiated. Fruits and starchy vegetables provide essential vitamins and minerals required for immune function, eyesight support, brain support, bone density, wound healing and many other functions within the body. According to the current evidenced based 2020-2025 Dietary Guidelines for Americans, complex carbohydrates are part of a healthy eating pattern which is associated with a decreased risk for type 2 diabetes, cancers, and cardiovascular disease.  Encouraged pt to continue to eat balanced meals inclusive of non starchy vegetables 2 times a day 7 days a week Encouraged pt to continue to drink a minium 64 fluid ounces with half being plain water to satisfy proper hydration    Pre-Op Goals Progress & New Goals Continue: practicing not drinking with meals Continue: avoiding skipping meals Continue: aim for 60 grams of protein per day from food; less from protein shakes; use the bariatric MyPlate method; aim for protein with every meal or snack. Continue: increase non-starchy vegetables; aim for 2 or more servings per day.  Handouts Provided Include    Learning Style & Readiness for Change Teaching method utilized: Visual & Auditory  Demonstrated degree of understanding via: Teach Back  Readiness Level:  preparation Barriers to learning/adherence to lifestyle change: nothing  identified  RD's Notes for next Visit  Patient progress toward chosen goals  MONITORING & EVALUATION Dietary intake, weekly physical activity, body weight, and pre-op goals in 1 month.   Next Steps  Pt has completed visits. No further supervised visits required/recommended. Patient is to return to [facility] for pre-op class >2 weeks prior to scheduled surgery.

## 2023-11-03 NOTE — Telephone Encounter (Signed)
 Requesting office sent duplicate request inquiring if pt has been cleared. See notes from Dr. Bjorn Pippin 10/01/23.

## 2023-11-04 ENCOUNTER — Telehealth: Payer: Self-pay | Admitting: *Deleted

## 2023-11-04 ENCOUNTER — Encounter (INDEPENDENT_AMBULATORY_CARE_PROVIDER_SITE_OTHER): Payer: Self-pay | Admitting: Cardiology

## 2023-11-04 ENCOUNTER — Encounter (HOSPITAL_COMMUNITY)
Admission: RE | Admit: 2023-11-04 | Discharge: 2023-11-04 | Disposition: A | Discharge status: Home / Self Care | Source: Ambulatory Visit | Attending: Cardiology | Admitting: Cardiology

## 2023-11-04 DIAGNOSIS — R0602 Shortness of breath: Secondary | ICD-10-CM | POA: Insufficient documentation

## 2023-11-04 DIAGNOSIS — G4733 Obstructive sleep apnea (adult) (pediatric): Secondary | ICD-10-CM | POA: Diagnosis not present

## 2023-11-04 DIAGNOSIS — Z01818 Encounter for other preprocedural examination: Secondary | ICD-10-CM | POA: Diagnosis present

## 2023-11-04 LAB — NM PET CT CARDIAC PERFUSION MULTI W/ABSOLUTE BLOODFLOW
LV dias vol: 75 mL (ref 46–106)
LV sys vol: 23 mL
MBFR: 2.36
Nuc Rest EF: 70 %
Nuc Stress EF: 73 %
Rest MBF: 1.1 ml/g/min
Rest Nuclear Isotope Dose: 29.8 mCi
ST Depression (mm): 0 mm
Stress MBF: 2.6 ml/g/min
Stress Nuclear Isotope Dose: 29.9 mCi

## 2023-11-04 MED ORDER — REGADENOSON 0.4 MG/5ML IV SOLN
INTRAVENOUS | Status: AC
  Filled 2023-11-04: qty 5

## 2023-11-04 MED ORDER — RUBIDIUM RB82 GENERATOR (RUBYFILL)
29.8000 | PACK | Freq: Once | INTRAVENOUS | Status: AC
  Administered 2023-11-04: 29.8 via INTRAVENOUS

## 2023-11-04 MED ORDER — RUBIDIUM RB82 GENERATOR (RUBYFILL)
29.9100 | PACK | Freq: Once | INTRAVENOUS | Status: AC
  Administered 2023-11-04: 29.91 via INTRAVENOUS

## 2023-11-04 MED ORDER — REGADENOSON 0.4 MG/5ML IV SOLN
0.4000 mg | Freq: Once | INTRAVENOUS | Status: AC
  Administered 2023-11-04: 0.4 mg via INTRAVENOUS

## 2023-11-04 NOTE — Telephone Encounter (Signed)
 Left message for the pt to call back to in regard to [place] study. I received a message from sleep team the pt has been given the PIN# though I am unsure if the pt has been set up with the sleep study yet.

## 2023-11-04 NOTE — Telephone Encounter (Signed)
-----   Message from [facility] sent at 11/04/2023  2:31 PM EDT ----- Regarding: RE: Hulen Shouts afternoon everyone,     Sorry to be a bother but I have a question. So, I have been getting pt's for [name] set up but they are seen at [facility] and [facility] provider ordering. Now, I am happy to help the pt, however, the volume over here at [facility] st if large as well as preop is busting at the seams. I thought there were a couple of CMA's at [facility] that were doing the set up for [facility] pt's. If this changed no one has let me know. Again, just asking for clarification. I am happy to help the pt.   I am also getting messages where the pt has been given a PIN # and I read the chart and find out the pt has not been given a device yet. Not sure what happed there, but I would be notified when the pt needed a set up, but now I am not getting until the pt is approved. If this changed as well, again I was not made aware of the changes.   Thank you [name] ----- Message ----- From: Tarri Fuller, CMA Sent: 11/03/2023   8:20 AM EDT To: Tarri Fuller, CMA Subject: Vanessa Squires, LPN  [name], RN16 hours ago (3:45 PM)   Phone note routed to covering staff for follow-up.  Instructions for covering staff: 1. Please contact patient in 2 weeks if WatchPAT study results are not available yet. Remind patient to complete test. 2. If patient declines to proceed with test, please confirm that box is unopened and remind patient to return it to the office within 30 days. Route phone note to [facility] pool for tracking. 3. If box has been opened, please route phone note to [facility] pool to have device de-initialized and processed for billing.  [name], [name], LPN16 hours ago (3:44 PM)   1. Ordering provider: Bjorn Williams 2. Associated diagnoses: Daytime Somnolence 3. WatchPAT PA obtained on 11/02/2023 by Vanessa Genera, LPN. Authorization: Yes; tracking ID   [id number]. 4. Patient notified of PIN (1234) on 11/02/2023 via Notification Method: phone.     Note  [name] routed conversation to You; [facility] [id number] hours ago (9:40 AM)  Vanessa Carl E22 hours ago (9:40 AM)  KT Patient states she is awaiting activation code for sleep apnea ?watch. Please advise.   Note  [name], [name] [phone]  [name]

## 2023-11-05 NOTE — Telephone Encounter (Signed)
 Stress test looks good, no further work-up recommended prior to surgery

## 2023-11-05 NOTE — Telephone Encounter (Signed)
   Name: [name]  DOB: Nov 28, 1970  MRN: [id number]   Primary Cardiologist: Little Ishikawa, MD  Chart reviewed as part of pre-operative protocol coverage.   Per Dr. Bjorn Pippin Stress test looks good, no further work-up recommended prior to surgery.  Therefore, based on ACC/AHA guidelines, the patient would be at acceptable risk for the planned procedure without further cardiovascular testing.    I will route this recommendation to the requesting party via Epic fax function and remove from pre-op pool. Please call with questions.  Roe Rutherford Lino Wickliff, PA 11/05/2023, 9:04 AM

## 2023-11-06 ENCOUNTER — Ambulatory Visit: Attending: Cardiology

## 2023-11-06 DIAGNOSIS — R400 Somnolence: Secondary | ICD-10-CM

## 2023-11-06 NOTE — Procedures (Signed)
   SLEEP STUDY REPORT Patient Information Study Date: 11/04/2023 Patient Name: [name] Patient ID: [id number] Birth Date: 1971/05/08 Age: 53 Gender: Female BMI: 36.3 (W=205 lb, H=5' 3'') Stopbang: 4 Referring Physician: Epifanio Lesches, MD  TEST DESCRIPTION: Home sleep apnea testing was completed using the WatchPat, a Type 1 device, utilizing peripheral arterial tonometry (PAT), chest movement, actigraphy, pulse oximetry, pulse rate, body position and snore. AHI was calculated with apnea and hypopnea using valid sleep time as the denominator. RDI includes apneas, hypopneas, and RERAs. The data acquired and the scoring of sleep and all associated events were performed in accordance with the recommended standards and specifications as outlined in the AASM Manual for the Scoring of Sleep and Associated Events 2.2.0 (2015).  FINDINGS:  1. Mild Obstructive Sleep Apnea with AHI 8.8/hr overall but 25.4/hr during REM sleep.  2. No Central Sleep Apnea with pAHIc 0/hr.  3. Oxygen desaturations as low as 75%.  4. Severe snoring was present. O2 sats were < 88% for 6.6 min.  5. Total sleep time was 5 hrs and 53 min.  6. 30.7% of total sleep time was spent in REM sleep.  7. Normal sleep onset latency at 23 min.  8. Shortened REM sleep onset latency at 74 min.  9. Total awakenings were 8. 10. Arrhythmia detection: None  DIAGNOSIS: Mild Obstructive Sleep Apnea (G47.33) Nocturnal Hypoxemia  RECOMMENDATIONS: 1. Clinical correlation of these findings is necessary. The decision to treat obstructive sleep apnea (OSA) is usually based on the presence of apnea symptoms or the presence of associated medical conditions such as Hypertension, Congestive Heart Failure, Atrial Fibrillation or Obesity. The most common symptoms of OSA are snoring, gasping for breath while sleeping, daytime sleepiness and fatigue.  2. Initiating apnea therapy is recommended given the presence of symptoms  and/or associated conditions. Recommend proceeding with one of the following:   a. Auto-CPAP therapy with a pressure range of 5-20cm H2O.   b. An oral appliance (OA) that can be obtained from certain dentists with expertise in sleep medicine. These are primarily of use in non-obese patients with mild and moderate disease.   c. An ENT consultation which may be useful to look for specific causes of obstruction and possible treatment options.   d. If patient is intolerant to PAP therapy, consider referral to ENT for evaluation for hypoglossal nerve stimulator.  3. Close follow-up is necessary to ensure success with CPAP or oral appliance therapy for maximum benefit .  4. A follow-up oximetry study on CPAP is recommended to assess the adequacy of therapy and determine the need for supplemental oxygen or the potential need for Bi-level therapy. An arterial blood gas to determine the adequacy of baseline ventilation and oxygenation should also be considered.  5. Healthy sleep recommendations include: adequate nightly sleep (normal 7-9 hrs/night), avoidance of caffeine after noon and alcohol near bedtime, and maintaining a sleep environment that is cool, dark and quiet.  6. Weight loss for overweight patients is recommended. Even modest amounts of weight loss can significantly improve the severity of sleep apnea.  7. Snoring recommendations include: weight loss where appropriate, side sleeping, and avoidance of alcohol before bed.  8. Operation of motor vehicle should be avoided when sleepy.  Signature: Armanda Magic, MD; [facility]; Diplomat, American Board of Sleep Medicine Electronically Signed: 11/06/2023 12:17:25 PM

## 2023-11-10 ENCOUNTER — Encounter: Payer: Self-pay | Admitting: *Deleted

## 2023-11-10 ENCOUNTER — Telehealth: Payer: Self-pay | Admitting: *Deleted

## 2023-11-10 DIAGNOSIS — R400 Somnolence: Secondary | ICD-10-CM

## 2023-11-10 DIAGNOSIS — G4733 Obstructive sleep apnea (adult) (pediatric): Secondary | ICD-10-CM

## 2023-11-10 NOTE — Telephone Encounter (Signed)
-----   Message from Armanda Magic sent at 11/06/2023 12:19 PM EDT ----- Please let patient know that they have sleep apnea and recommend treating with CPAP.  Please order an auto CPAP from 4-15cm H2O with heated humidity and mask of choice.  Order overnight pulse ox on CPAP.  Followup with me in 6 weeks.

## 2023-11-10 NOTE — Telephone Encounter (Signed)
 The patient has been notified of the result and verbalized understanding.  All questions (if any) were answered. [name], CMA 11/10/2023 6:34 PM    Upon patient request DME selection is [facility] Patient understands he will be contacted by [facility] to set up his cpap. Patient understands to call if [facility] does not contact him with new setup in a timely manner. Patient understands they will be called once confirmation has been received from [facility] that they have received their new machine to schedule 10 week follow up appointment.   [facility] notified of new cpap order  Please add to airview Patient was grateful for the call and thanked me.

## 2023-11-16 ENCOUNTER — Telehealth: Payer: Self-pay | Admitting: Internal Medicine

## 2023-11-16 NOTE — Telephone Encounter (Signed)
 Copied from CRM [id number]. Topic: General - Other >> Nov 16, 2023 10:01 AM [name] wrote: Reason for CRM: Patient is calling in because she would like someone to contact her about  Getting a temporary handicap placard. She states until she receives her CPAP machine the parking lot at work has been putting her out of breath due to the long distance walk she has to do

## 2023-11-19 NOTE — Telephone Encounter (Signed)
 I will re-fax notes to requesting office to the fax # given today [phone]

## 2023-11-19 NOTE — Telephone Encounter (Signed)
 Requesting called in they did not receive fax, she asked if be sent over again please  Fax: [phone]

## 2023-11-20 ENCOUNTER — Other Ambulatory Visit: Payer: Self-pay | Admitting: Gastroenterology

## 2023-11-20 DIAGNOSIS — R140 Abdominal distension (gaseous): Secondary | ICD-10-CM

## 2023-11-20 DIAGNOSIS — Z1211 Encounter for screening for malignant neoplasm of colon: Secondary | ICD-10-CM

## 2023-11-20 DIAGNOSIS — D128 Benign neoplasm of rectum: Secondary | ICD-10-CM

## 2023-11-23 ENCOUNTER — Ambulatory Visit: Admitting: Internal Medicine

## 2023-11-23 ENCOUNTER — Telehealth: Payer: Self-pay | Admitting: Gastroenterology

## 2023-11-23 DIAGNOSIS — R140 Abdominal distension (gaseous): Secondary | ICD-10-CM

## 2023-11-23 DIAGNOSIS — Z1211 Encounter for screening for malignant neoplasm of colon: Secondary | ICD-10-CM

## 2023-11-23 DIAGNOSIS — D128 Benign neoplasm of rectum: Secondary | ICD-10-CM

## 2023-11-23 MED ORDER — LINACLOTIDE 290 MCG PO CAPS: 290.0000 ug | ORAL_CAPSULE | Freq: Every day | ORAL | 3 refills | Status: AC

## 2023-11-23 MED ORDER — PANTOPRAZOLE SODIUM 40 MG PO TBEC: 40.0000 mg | DELAYED_RELEASE_TABLET | Freq: Every day | ORAL | 1 refills | Status: AC

## 2023-11-23 NOTE — Telephone Encounter (Signed)
 Patient called and stated that she is needing a refill on her Linzess  and Pantoprazole . Patient is requesting a call back.Please advise.

## 2023-11-23 NOTE — Telephone Encounter (Signed)
 I called patient and discussed with her that she needs to be seen by Dr Leonia Raman in the office for refills. First available is 03/01/2024. I scheduled her for a follow up for Linzess  and pantoprazole  refills  Sent refills to last until appt time

## 2023-11-25 NOTE — Telephone Encounter (Signed)
 Okay.  Thanks.

## 2023-11-25 NOTE — Telephone Encounter (Signed)
 Called and informed patient that form was ready. Placed form up front for pick up

## 2023-11-26 ENCOUNTER — Other Ambulatory Visit: Payer: Self-pay | Admitting: Internal Medicine

## 2023-12-08 ENCOUNTER — Ambulatory Visit: Admitting: Plastic Surgery

## 2023-12-08 VITALS — BP 145/86 | HR 76 | Ht 63.0 in | Wt 265.0 lb

## 2023-12-08 DIAGNOSIS — S21001A Unspecified open wound of right breast, initial encounter: Secondary | ICD-10-CM

## 2023-12-08 NOTE — Progress Notes (Signed)
   Subjective:    Patient ID: [name], female    DOB: 12-15-1970, 53 y.o.   MRN: [id number]  The patient is a 53 year old female here for follow-up after undergoing breast reduction 2 to 3 years ago.  She noticed a little skin lesion between her breasts and wanted to be sure it was not anything to worry about.  Since she noticed it it is since improved and there is just a little bit of a scarring left but no lesion.  It looks like it was probably a pimple.  I did take a picture and we will monitor that.  She will definitely let me know if it comes back.  She is getting ready for gastric surgery with Dr. Elvan Hamel for weight reduction.      Review of Systems  Constitutional: Negative.   HENT: Negative.    Eyes: Negative.   Respiratory: Negative.    Cardiovascular: Negative.   Gastrointestinal: Negative.   Endocrine: Negative.   Genitourinary: Negative.        Objective:   Physical Exam Constitutional:      Appearance: Normal appearance.  HENT:     Head: Atraumatic.  Cardiovascular:     Rate and Rhythm: Normal rate.     Pulses: Normal pulses.  Musculoskeletal:        General: No swelling or deformity.  Skin:    General: Skin is warm.     Capillary Refill: Capillary refill takes less than 2 seconds.     Coloration: Skin is not jaundiced.     Findings: Lesion present. No bruising.  Neurological:     Mental Status: She is alert and oriented to person, place, and time.  Psychiatric:        Mood and Affect: Mood normal.        Behavior: Behavior normal.        Thought Content: Thought content normal.        Judgment: Judgment normal.        Assessment & Plan:     ICD-10-CM   1. Wound of right breast, initial encounter  S21.001A        Pictures were obtained of the patient and placed in the chart with the patient's or guardian's permission.

## 2023-12-22 ENCOUNTER — Other Ambulatory Visit: Payer: Self-pay | Admitting: Internal Medicine

## 2023-12-30 ENCOUNTER — Encounter: Payer: Self-pay | Admitting: Cardiology

## 2023-12-30 ENCOUNTER — Ambulatory Visit: Payer: Self-pay | Admitting: Cardiology

## 2024-01-15 ENCOUNTER — Other Ambulatory Visit: Payer: Self-pay | Admitting: Internal Medicine

## 2024-01-20 ENCOUNTER — Other Ambulatory Visit: Payer: Self-pay | Admitting: Internal Medicine

## 2024-02-01 ENCOUNTER — Encounter: Payer: Self-pay | Admitting: Skilled Nursing Facility1

## 2024-02-01 ENCOUNTER — Encounter: Attending: General Surgery | Admitting: Skilled Nursing Facility1

## 2024-02-01 NOTE — Progress Notes (Signed)
 Pre-Operative Nutrition Class:    Patient was seen on 02/01/2024 for Pre-Operative Bariatric Surgery Education at [facility]    Surgery date: 02/29/2024 Surgery type: RYGB Start weight at [facility]: 260.7 Weight today: 270.7  Samples given per MNT protocol. Patient educated on appropriate usage: Samples given per MNT protocol. Patient educated on appropriate usage: Ensure Max Lot # [id number] Exp: 10/02/2024   Celebrate multi Lot # [id number] Exp: 01/26  The following the learning objectives were met by the patient during this course: Identify Pre-Op Dietary Goals and will begin 2 weeks pre-operatively Identify appropriate sources of fluids and proteins  State protein recommendations and appropriate sources pre and post-operatively Identify Post-Operative Dietary Goals and will follow for 2 weeks post-operatively Identify appropriate multivitamin and calcium  sources Describe the need for physical activity post-operatively and will follow MD recommendations State when to call healthcare provider regarding medication questions or post-operative complications When having a diagnosis of diabetes understanding hypoglycemia symptoms and the inclusion of 1 complex carbohydrate per meal  Handouts given during class include: Pre-Op Bariatric Surgery Diet Handout Protein Shake Handout Post-Op Bariatric Surgery Nutrition Handout BELT Program Information Flyer Support Group Information Flyer [facility] Bariatric Supplements Price List  Follow-Up Plan: Patient will follow-up at [facility] 2 weeks post operatively for diet advancement per MD.

## 2024-02-08 ENCOUNTER — Telehealth: Payer: Self-pay | Admitting: Dietician

## 2024-02-08 DIAGNOSIS — E669 Obesity, unspecified: Secondary | ICD-10-CM

## 2024-02-08 NOTE — Telephone Encounter (Signed)
 Pt called with questions about multivitamin and vitamin B1 prior to and after bariatric surgery. Patients questions were answered to patients satisfaction and patient expressed understanding.

## 2024-02-09 ENCOUNTER — Telehealth: Payer: Self-pay | Admitting: Dietician

## 2024-02-09 DIAGNOSIS — E669 Obesity, unspecified: Secondary | ICD-10-CM

## 2024-02-09 NOTE — Telephone Encounter (Signed)
 Pt called today to ask questions about the bariatric vitamins while at [facility]. Pt expressed understanding and questions were answered to patient satisfaction.

## 2024-02-13 ENCOUNTER — Other Ambulatory Visit: Payer: Self-pay | Admitting: Internal Medicine

## 2024-02-15 ENCOUNTER — Ambulatory Visit: Payer: Self-pay | Admitting: General Surgery

## 2024-02-15 DIAGNOSIS — E538 Deficiency of other specified B group vitamins: Secondary | ICD-10-CM

## 2024-02-15 DIAGNOSIS — E279 Disorder of adrenal gland, unspecified: Secondary | ICD-10-CM

## 2024-02-15 NOTE — Progress Notes (Signed)
 Surgery orders requested via Epic inbox.

## 2024-02-17 ENCOUNTER — Ambulatory Visit: Admitting: Internal Medicine

## 2024-02-18 ENCOUNTER — Ambulatory Visit: Admitting: Internal Medicine

## 2024-02-18 ENCOUNTER — Encounter: Payer: Self-pay | Admitting: Internal Medicine

## 2024-02-18 VITALS — BP 127/85 | HR 87 | Temp 98.3°F | Ht 63.0 in | Wt 271.0 lb

## 2024-02-18 DIAGNOSIS — M797 Fibromyalgia: Secondary | ICD-10-CM

## 2024-02-18 DIAGNOSIS — E279 Disorder of adrenal gland, unspecified: Secondary | ICD-10-CM | POA: Diagnosis not present

## 2024-02-18 DIAGNOSIS — I10 Essential (primary) hypertension: Secondary | ICD-10-CM

## 2024-02-18 DIAGNOSIS — Z01818 Encounter for other preprocedural examination: Secondary | ICD-10-CM

## 2024-02-18 DIAGNOSIS — R0609 Other forms of dyspnea: Secondary | ICD-10-CM

## 2024-02-18 DIAGNOSIS — M545 Low back pain, unspecified: Secondary | ICD-10-CM

## 2024-02-18 DIAGNOSIS — G8929 Other chronic pain: Secondary | ICD-10-CM

## 2024-02-18 DIAGNOSIS — E538 Deficiency of other specified B group vitamins: Secondary | ICD-10-CM | POA: Diagnosis not present

## 2024-02-18 NOTE — Assessment & Plan Note (Signed)
 [name] is clear for surgery assuming her pre-op labs are acceptable. Thank you!

## 2024-02-18 NOTE — Assessment & Plan Note (Signed)
 Planning gastric bypass and hernia surgery 02/29/24 - Dr Tanda

## 2024-02-18 NOTE — Progress Notes (Signed)
 Subjective:  Patient ID: [name], female    DOB: 08-26-1970  Age: 53 y.o. MRN: [id number]  CC: Medical Management of Chronic Issues (Pt is wanting to discuss changing medications due to upcoming Gastric Bypass surgery)   HPI [name] presents for gastric bypass and hernia surgery 02/29/24.  She needs a a preop medical clearance. Follow-up on morbid obesity, anxiety, multifactorial dyspnea on exertion  Per Dr Tanda:  She is scheduled for gastric bypass surgery. Preoperative evaluations include a cardiology stress test indicating low risk and a sleep study showing mild sleep apnea. She is nervous about the surgery, particularly concerning potential scar tissue from previous myomectomies. The surgeon explained the potential challenges with scar tissue and the need to address a small incisional hernia during the procedure. She is aware of the dietary changes post-surgery and the importance of taking a bariatric multivitamin to prevent deficiencies. The surgeon discussed the potential for scar tissue complications, including the risk of bowel perforation if small bowel is adhered to the abdominal wall. The incisional hernia will be repaired during the procedure to prevent postoperative bowel obstruction. Patient read over the surgical consent form. All of her questions were asked and answered. She has attended her preop education class. We discussed the typical hospitalization and the typical recovery. Had opportunity to have her questions asked and answered. Discussed the importance of the preoperative meal plan. - Proceed with gastric bypass surgery. - Address small incisional hernia during surgery. - Ensure she takes bariatric multivitamin and calcium  post-surgery. - Monitor vitamin levels post-surgery for deficiencies. - Advise her to stop naproxen  5-7 days before surgery and switch to Tylenol  for pain management. - Educate her on the importance of sipping liquids post-surgery and  provide necessary documentation for work accommodations. - Advise her on potential hair loss post-surgery and the importance of protein intake and multivitamin use. - Discuss potential fatigue and constipation post-surgery and the need for Linzess  and possibly additional GI medications. - Instruct her on preoperative meal plan and activity level.  Small incisional hernia A small incisional hernia was identified at her belly button, with a small knuckle of bowel protruding. This will need to be addressed during the gastric bypass surgery to prevent postoperative complications. - Repair small incisional hernia during gastric bypass surgery.  Mild sleep apnea She has been using CPAP therapy, which has resulted in a slight improvement in her energy levels. The sleep study indicated mild sleep apnea. - Continue CPAP therapy.  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  Outpatient Medications Prior to Visit  Medication Sig Dispense Refill   acetaminophen  (TYLENOL ) 500 MG tablet Take 1,000 mg by mouth every 8 (eight) hours as needed for moderate pain (pain score 4-6).     Albuterol -Budesonide (AIRSUPRA ) 90-80 MCG/ACT AERO Inhale 2 Inhalations into the lungs every 4 (four) hours as needed. 10 g 5   ALPRAZolam  (XANAX ) 1 MG tablet TAKE 1 TABLET (1 MG TOTAL) BY MOUTH 3 TIMES/DAY AS NEEDED-BETWEEN MEALS & BEDTIME FOR ANXIETY (Patient taking differently: Take 1 mg by mouth at bedtime.) 90 tablet 1   carvedilol  (COREG ) 6.25 MG tablet Take 1 tablet (6.25 mg total) by mouth 2 (two) times daily with a meal. 60 tablet 11   cholecalciferol  (VITAMIN D3) 25 MCG (1000 UNIT) tablet Take 1,000 Units by mouth daily.     cyclobenzaprine  (FLEXERIL ) 10 MG tablet TAKE 1 TABLET BY MOUTH EVERYDAY AT BEDTIME 90 tablet 3   furosemide  (  LASIX ) 40 MG tablet TAKE 1 TABLET BY MOUTH DAILY AS NEEDED 90 tablet 1   hydrOXYzine  (VISTARIL ) 25 MG capsule TAKE 1-2 CAPSULES  BY MOUTH AT BEDTIME AS NEEDED (Patient taking differently: Take 25 mg by mouth at bedtime.) 180 capsule 1   linaclotide  (LINZESS ) 290 MCG CAPS capsule Take 1 capsule (290 mcg total) by mouth daily before breakfast. 30 capsule 3   naproxen  (NAPROSYN ) 500 MG tablet TAKE 1 TABLET BY MOUTH 2 TIMES DAILY WITH A MEAL. (Patient taking differently: Take 500 mg by mouth 2 (two) times daily as needed for moderate pain (pain score 4-6).) 60 tablet 3   norethindrone-ethinyl estradiol (FYAVOLV) 1-5 MG-MCG TABS tablet Take 1 tablet by mouth daily.     Olmesartan -amLODIPine -HCTZ 40-5-12.5 MG TABS TAKE 1 TABLET BY MOUTH EVERY DAY 90 tablet 2   pantoprazole  (PROTONIX ) 40 MG tablet Take 1 tablet (40 mg total) by mouth daily. 90 tablet 1   potassium chloride  SA (KLOR-CON  M) 20 MEQ tablet TAKE 1 TABLET BY MOUTH TWICE A DAY (Patient taking differently: Take 20 mEq by mouth daily.) 180 tablet 1   Prenatal Vit-Fe Fumarate-FA (M-NATAL PLUS) 27-1 MG TABS TAKE 1 TABLET BY MOUTH EVERY DAY 90 tablet 1   rizatriptan  (MAXALT ) 10 MG tablet TAKE 1 TABLET BY MOUTH ONCE AS NEEDED FOR UP TO 1 DOSE FOR MIGRAINE. MAY REPEAT IN 2 HOURS 12 tablet 5   thiamine (VITAMIN B-1) 100 MG tablet Take 100 mg by mouth daily.     No facility-administered medications prior to visit.    ROS: Review of Systems  Constitutional:  Negative for activity change, appetite change, chills, fatigue and unexpected weight change.  HENT:  Negative for congestion, mouth sores and sinus pressure.   Eyes:  Negative for visual disturbance.  Respiratory:  Positive for shortness of breath. Negative for cough and chest tightness.   Cardiovascular:  Negative for leg swelling.  Gastrointestinal:  Negative for abdominal pain and nausea.  Genitourinary:  Negative for difficulty urinating, frequency and vaginal pain.  Musculoskeletal:  Positive for back pain. Negative for gait problem.  Skin:  Negative for pallor and rash.  Neurological:  Negative for dizziness,  tremors, weakness, numbness and headaches.  Hematological:  Does not bruise/bleed easily.  Psychiatric/Behavioral:  Negative for confusion and sleep disturbance. The patient is not nervous/anxious.     Objective:  BP 127/85   Pulse 87   Temp 98.3 F (36.8 C) (Oral)   Ht 5' 3 (1.6 m)   Wt 271 lb (122.9 kg)   SpO2 100%   BMI 48.01 kg/m   BP Readings from Last 3 Encounters:  02/22/24 128/66  02/18/24 127/85  12/08/23 (!) 145/86    Wt Readings from Last 3 Encounters:  02/22/24 264 lb (119.7 kg)  02/18/24 271 lb (122.9 kg)  02/01/24 270 lb 11.2 oz (122.8 kg)    Physical Exam Constitutional:      General: She is not in acute distress.    Appearance: She is well-developed. She is obese. She is not toxic-appearing.  HENT:     Head: Normocephalic.     Right Ear: External ear normal.     Left Ear: External ear normal.     Nose: Nose normal.  Eyes:     General:        Right eye: No discharge.        Left eye: No discharge.     Conjunctiva/sclera: Conjunctivae normal.     Pupils: Pupils are equal, round, and  reactive to light.  Neck:     Thyroid : No thyromegaly.     Vascular: No JVD.     Trachea: No tracheal deviation.  Cardiovascular:     Rate and Rhythm: Normal rate and regular rhythm.     Heart sounds: Normal heart sounds.  Pulmonary:     Effort: No respiratory distress.     Breath sounds: No stridor. No wheezing.  Abdominal:     General: Bowel sounds are normal. There is no distension.     Palpations: Abdomen is soft. There is no mass.     Tenderness: There is no abdominal tenderness. There is no guarding or rebound.  Musculoskeletal:        General: No tenderness.     Cervical back: Normal range of motion and neck supple. No rigidity.  Lymphadenopathy:     Cervical: No cervical adenopathy.  Skin:    Findings: No erythema or rash.  Neurological:     Mental Status: She is oriented to person, place, and time.     Cranial Nerves: No cranial nerve deficit.      Motor: No abnormal muscle tone.     Coordination: Coordination normal.     Deep Tendon Reflexes: Reflexes normal.  Psychiatric:        Behavior: Behavior normal.        Thought Content: Thought content normal.        Judgment: Judgment normal.     Lab Results  Component Value Date   WBC 3.9 (L) 02/22/2024   HGB 12.8 02/22/2024   HCT 41.8 02/22/2024   PLT 157 02/22/2024   GLUCOSE 111 (H) 02/22/2024   CHOL 170 05/13/2023   TRIG 85.0 05/13/2023   HDL 53.80 05/13/2023   LDLCALC 99 05/13/2023   ALT 49 (H) 02/22/2024   AST 43 (H) 02/22/2024   NA 140 02/22/2024   K 3.7 02/22/2024   CL 106 02/22/2024   CREATININE 0.55 02/22/2024   BUN 12 02/22/2024   CO2 24 02/22/2024   TSH 0.63 03/20/2023   INR 0.9 RATIO 03/15/2007   HGBA1C 6.2 05/13/2023    NM PET CT CARDIAC PERFUSION MULTI W/ABSOLUTE BLOODFLOW Result Date: 11/04/2023   LV perfusion is normal. There is no evidence of ischemia. There is no evidence of infarction.   Rest left ventricular function is normal. Rest EF: 70%. Stress left ventricular function is normal. Stress EF: 73%. End diastolic cavity size is normal. End systolic cavity size is normal.   Myocardial blood flow was computed to be 1.61ml/g/min at rest and 2.60ml/g/min at stress. Global myocardial blood flow reserve was 2.36 and was normal.   Coronary calcium  was absent on the attenuation correction CT images.   The study is normal. The study is low risk. EXAM: OVER-READ INTERPRETATION  PET-CT CHEST The following report is an over-read performed by radiologist Dr. Camellia Lang [facility] on 11/04/2023. This over-read does not include interpretation of cardiac or coronary anatomy or pathology. The cardiac PET and cardiac CT interpretation by the cardiologist is to be attached. COMPARISON:  None. FINDINGS: No evidence for lymphadenopathy within the visualized mediastinum or hilar regions. The visualized lung parenchyma shows no suspicious pulmonary nodule or mass. No  focal airspace consolidation. No effusion. Visualized portions of the upper abdomen are unremarkable. No suspicious lytic or sclerotic osseous abnormality. IMPRESSION: No acute or clinically significant extracardiac findings. Electronically Signed   By: Camellia Candle M.D.   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      B12 deficiency   Continue with vitamin B12      DOE (dyspnea on exertion)   Gastric bypass surgery with subsequent weight loss should help Donielle a lot to relieve this problem      Essential hypertension   Continue with Coreg , furosemide       Fibromyalgia   Chronic.  Hope, her fibromyalgia improves with weight loss following gastric bypass      Low back pain   Low back pain should improve following gastric bypass surgery with subsequent weight loss      OBESITY, MORBID WITH STARTING BMI 44    Planning gastric bypass and hernia surgery 02/29/24 - Dr Tanda      Preop exam for internal medicine - Primary   Julieanne is clear for surgery assuming her pre-op labs are acceptable. Thank you!         No orders of the defined types were placed in this encounter.     Follow-up: Return in about 3 months (around 05/20/2024) for a follow-up visit.  Marolyn Noel, MD

## 2024-02-18 NOTE — Assessment & Plan Note (Signed)
 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

## 2024-02-18 NOTE — Assessment & Plan Note (Signed)
Continue with vitamin B12 

## 2024-02-19 NOTE — Patient Instructions (Addendum)
 SURGICAL WAITING ROOM VISITATION Patients having surgery or a procedure may have no more than 2 support people in the waiting area - these visitors may rotate in the visitor waiting room.   Due to an increase in RSV and influenza rates and associated hospitalizations, children ages 59 and under may not visit patients in [facility] hospitals. If the patient needs to stay at the hospital during part of their recovery, the visitor guidelines for inpatient rooms apply.  PRE-OP VISITATION  Pre-op nurse will coordinate an appropriate time for 1 support person to accompany the patient in pre-op.  This support person may not rotate.  This visitor will be contacted when the time is appropriate for the visitor to come back in the pre-op area.  Please refer to the [facility] website for the visitor guidelines for Inpatients (after your surgery is over and you are in a regular room).  You are not required to quarantine at this time prior to your surgery. However, you must do this: Hand Hygiene often Do NOT share personal items Notify your provider if you are in close contact with someone who has COVID or you develop fever 100.4 or greater, new onset of sneezing, cough, sore throat, shortness of breath or body aches.  If you test positive for Covid or have been in contact with anyone that has tested positive in the last 10 days please notify you surgeon.    Your procedure is scheduled on: 02/29/24   Report to [facility] Main Entrance: [place] entrance where the [employer] is available.   Report to admitting at: 5:15 AM  Call this number if you have any questions or problems the morning of surgery [phone]  FOLLOW ANY ADDITIONAL PRE OP INSTRUCTIONS YOU RECEIVED FROM YOUR SURGEON'S OFFICE!!!  Do not eat food after : 6:00 PM the day prior to your surgery/procedure.  After 6:00 PM you may have the following liquids until: 4:30 AM DAY OF SURGERY  Clear Liquid Diet Water Black Coffee  (sugar ok, NO MILK/CREAM OR CREAMERS)  Tea (sugar ok, NO MILK/CREAM OR CREAMERS) regular and decaf                             Plain Jell-O  with no fruit (NO RED)                                           Fruit ices (not with fruit pulp, NO RED)                                     Popsicles (NO RED)                                                                  Juice: NO CITRUS JUICES: only apple, WHITE grape, WHITE cranberry Sports drinks like Gatorade or Powerade (NO RED)   The day of surgery:  Drink ONE (1) Pre-Surgery Clear G2 at : 4:30 AM the morning of surgery. Drink in one sitting. Do not sip.  This  drink was given to you during your hospital pre-op appointment visit. Nothing else to drink after completing the Pre-Surgery Clear Ensure or G2 : No candy, chewing gum or throat lozenges.   MORNING OF SURGERY DRINK:   DRINK 1 G2 drink BEFORE YOU LEAVE HOME, DRINK ALL OF THE  G2 DRINK AT ONE TIME.   NO SOLID FOOD AFTER 600 PM THE NIGHT BEFORE YOUR SURGERY. YOU MAY DRINK CLEAR FLUIDS. THE G2 DRINK YOU DRINK BEFORE YOU LEAVE HOME WILL BE THE LAST FLUIDS YOU DRINK BEFORE SURGERY.  PAIN IS EXPECTED AFTER SURGERY AND WILL NOT BE COMPLETELY ELIMINATED. AMBULATION AND TYLENOL  WILL HELP REDUCE INCISIONAL AND GAS PAIN. MOVEMENT IS KEY!  YOU ARE EXPECTED TO BE OUT OF BED WITHIN 4 HOURS OF ADMISSION TO YOUR PATIENT ROOM.  SITTING IN THE RECLINER THROUGHOUT THE DAY IS IMPORTANT FOR DRINKING FLUIDS AND MOVING GAS THROUGHOUT THE GI TRACT.  COMPRESSION STOCKINGS SHOULD BE WORN [facility] STAY UNLESS YOU ARE WALKING.   INCENTIVE SPIROMETER SHOULD BE USED EVERY HOUR WHILE AWAKE TO DECREASE POST-OPERATIVE COMPLICATIONS SUCH AS PNEUMONIA.  WHEN DISCHARGED HOME, IT IS IMPORTANT TO CONTINUE TO WALK EVERY HOUR AND USE THE INCENTIVE SPIROMETER EVERY HOUR.      Oral Hygiene is also important to reduce your risk of infection.        Remember - BRUSH YOUR TEETH THE MORNING OF SURGERY WITH YOUR  REGULAR TOOTHPASTE  Do NOT smoke after Midnight the night before surgery.  STOP TAKING all Vitamins, Herbs and supplements 1 week before your surgery.   Take ONLY these medicines the morning of surgery with A SIP OF WATER: carvedilol ,norethindrone,linaclotide ,pantoprazole .Use inhalers as usual and bring them.Tylenol ,rizatriptan  as needed.  If You have been diagnosed with Sleep Apnea - Bring CPAP mask and tubing day of surgery. We will provide you with a CPAP machine on the day of your surgery.                   You may not have any metal on your body including hair pins, jewelry, and body piercing  Do not wear make-up, lotions, powders, perfumes / cologne, or deodorant  Do not wear nail polish including gel and S&S, artificial / acrylic nails, or any other type of covering on natural nails including finger and toenails. If you have artificial nails, gel coating, etc., that needs to be removed by a nail salon, Please have this removed prior to surgery. Not doing so may mean that your surgery could be cancelled or delayed if the Surgeon or anesthesia staff feels like they are unable to monitor you safely.   Do not shave 48 hours prior to surgery to avoid nicks in your skin which may contribute to postoperative infections.   Contacts, Hearing Aids, dentures or bridgework may not be worn into surgery. DENTURES WILL BE REMOVED PRIOR TO SURGERY PLEASE DO NOT APPLY Poly grip OR ADHESIVES!!!  You may bring a small overnight bag with you on the day of surgery, only pack items that are not valuable. [facility] IS NOT RESPONSIBLE   FOR VALUABLES THAT ARE LOST OR STOLEN.   Patients discharged on the day of surgery will not be allowed to drive home.  Someone NEEDS to stay with you for the first 24 hours after anesthesia.  Do not bring your home medications to the hospital. The Pharmacy will dispense medications listed on your medication list to you during your admission in the Hospital.  Special  Instructions: Bring a  copy of your healthcare power of attorney and living will documents the day of surgery, if you wish to have them scanned into your  Medical Records- EPIC  Please read over the following fact sheets you were given: IF YOU HAVE QUESTIONS ABOUT YOUR PRE-OP INSTRUCTIONS, PLEASE CALL [phone]   [facility] - Preparing for Surgery Before surgery, you can play an important role.  Because skin is not sterile, your skin needs to be as free of germs as possible.  You can reduce the number of germs on your skin by washing with CHG (chlorahexidine gluconate) soap before surgery.  CHG is an antiseptic cleaner which kills germs and bonds with the skin to continue killing germs even after washing. Please DO NOT use if you have an allergy to CHG or antibacterial soaps.  If your skin becomes reddened/irritated stop using the CHG and inform your nurse when you arrive at Short Stay. Do not shave (including legs and underarms) for at least 48 hours prior to the first CHG shower.  You may shave your face/neck.  Please follow these instructions carefully:  1.  Shower with CHG Soap the night before surgery and the  morning of surgery.  2.  If you choose to wash your hair, wash your hair first as usual with your normal  shampoo.  3.  After you shampoo, rinse your hair and body thoroughly to remove the shampoo.                             4.  Use CHG as you would any other liquid soap.  You can apply chg directly to the skin and wash.  Gently with a scrungie or clean washcloth.  5.  Apply the CHG Soap to your body ONLY FROM THE NECK DOWN.   Do not use on face/ open                           Wound or open sores. Avoid contact with eyes, ears mouth and genitals (private parts).                       Wash face,  Genitals (private parts) with your normal soap.             6.  Wash thoroughly, paying special attention to the area where your  surgery  will be performed.  7.  Thoroughly rinse  your body with warm water from the neck down.  8.  DO NOT shower/wash with your normal soap after using and rinsing off the CHG Soap.            9.  Pat yourself dry with a clean towel.            10.  Wear clean pajamas.            11.  Place clean sheets on your bed the night of your first shower and do not  sleep with pets.  ON THE DAY OF SURGERY : Do not apply any lotions/deodorants the morning of surgery.  Please wear clean clothes to the hospital/surgery center.     FAILURE TO FOLLOW THESE INSTRUCTIONS MAY RESULT IN THE CANCELLATION OF YOUR SURGERY  PATIENT SIGNATURE_________________________________  NURSE SIGNATURE__________________________________  ________________________________________________________________________

## 2024-02-22 ENCOUNTER — Encounter (HOSPITAL_COMMUNITY)
Admission: RE | Admit: 2024-02-22 | Discharge: 2024-02-22 | Disposition: A | Discharge status: Home / Self Care | Source: Ambulatory Visit | Attending: General Surgery

## 2024-02-22 ENCOUNTER — Encounter (HOSPITAL_COMMUNITY): Payer: Self-pay

## 2024-02-22 ENCOUNTER — Encounter: Payer: Self-pay | Admitting: Internal Medicine

## 2024-02-22 ENCOUNTER — Other Ambulatory Visit: Payer: Self-pay

## 2024-02-22 VITALS — BP 128/66 | HR 72 | Temp 98.7°F | Ht 63.0 in | Wt 264.0 lb

## 2024-02-22 DIAGNOSIS — E279 Disorder of adrenal gland, unspecified: Secondary | ICD-10-CM | POA: Insufficient documentation

## 2024-02-22 DIAGNOSIS — E538 Deficiency of other specified B group vitamins: Secondary | ICD-10-CM | POA: Diagnosis not present

## 2024-02-22 DIAGNOSIS — Z01812 Encounter for preprocedural laboratory examination: Secondary | ICD-10-CM | POA: Diagnosis present

## 2024-02-22 DIAGNOSIS — Z01818 Encounter for other preprocedural examination: Secondary | ICD-10-CM

## 2024-02-22 HISTORY — DX: Dyspnea, unspecified: R06.00

## 2024-02-22 HISTORY — DX: Unspecified osteoarthritis, unspecified site: M19.90

## 2024-02-22 LAB — COMPREHENSIVE METABOLIC PANEL WITH GFR
ALT: 49 U/L — ABNORMAL HIGH (ref 0–44)
AST: 43 U/L — ABNORMAL HIGH (ref 15–41)
Albumin: 3.9 g/dL (ref 3.5–5.0)
Alkaline Phosphatase: 65 U/L (ref 38–126)
Anion gap: 10 (ref 5–15)
BUN: 12 mg/dL (ref 6–20)
CO2: 24 mmol/L (ref 22–32)
Calcium: 9.6 mg/dL (ref 8.9–10.3)
Chloride: 106 mmol/L (ref 98–111)
Creatinine, Ser: 0.55 mg/dL (ref 0.44–1.00)
GFR, Estimated: >60 mL/min (ref >60)
Glucose, Bld: 111 mg/dL — ABNORMAL HIGH (ref 70–99)
Potassium: 3.7 mmol/L (ref 3.5–5.1)
Sodium: 140 mmol/L (ref 135–145)
Total Bilirubin: 0.7 mg/dL (ref 0.0–1.2)
Total Protein: 7.7 g/dL (ref 6.5–8.1)

## 2024-02-22 LAB — CBC WITH DIFFERENTIAL/PLATELET
Abs Immature Granulocytes: 0.01 K/uL (ref 0.00–0.07)
Basophils Absolute: 0 K/uL (ref 0.0–0.1)
Basophils Relative: 1 %
Eosinophils Absolute: 0.1 K/uL (ref 0.0–0.5)
Eosinophils Relative: 3 %
HCT: 41.8 % (ref 36.0–46.0)
Hemoglobin: 12.8 g/dL (ref 12.0–15.0)
Immature Granulocytes: 0 %
Lymphocytes Relative: 36 %
Lymphs Abs: 1.4 K/uL (ref 0.7–4.0)
MCH: 25.5 pg — ABNORMAL LOW (ref 26.0–34.0)
MCHC: 30.6 g/dL (ref 30.0–36.0)
MCV: 83.3 fL (ref 80.0–100.0)
Monocytes Absolute: 0.4 K/uL (ref 0.1–1.0)
Monocytes Relative: 10 %
Neutro Abs: 1.9 K/uL (ref 1.7–7.7)
Neutrophils Relative %: 50 %
Platelets: 157 K/uL (ref 150–400)
RBC: 5.02 MIL/uL (ref 3.87–5.11)
RDW: 14.6 % (ref 11.5–15.5)
WBC: 3.9 K/uL — ABNORMAL LOW (ref 4.0–10.5)
nRBC: 0 % (ref 0.0–0.2)

## 2024-02-22 NOTE — Assessment & Plan Note (Signed)
 Low back pain should improve following gastric bypass surgery with subsequent weight loss

## 2024-02-22 NOTE — Assessment & Plan Note (Signed)
 Chronic.  [name], her fibromyalgia improves with weight loss following gastric bypass

## 2024-02-22 NOTE — Progress Notes (Signed)
 For Anesthesia: PCP - Plotnikov, Karlynn GAILS, MD LOV: 02/18/24 Cardiologist - Kate Lonni CROME, MD. : Clearance: 10/01/23  Bowel Prep reminder:  Chest x-ray - CT Cardiac: 11/04/23 EKG - 08/24/23 Stress Test -  ECHO - 06/17/23 Cardiac Cath -  Pacemaker/ICD device last checked: Pacemaker orders received: Device Rep notified:  Spinal Cord Stimulator:N/A  Sleep Study - Yes CPAP - Yes  Fasting Blood Sugar - N/A Checks Blood Sugar _____ times a day Date and result of last Hgb A1c-  Last dose of GLP1 agonist- N/A GLP1 instructions:   Last dose of SGLT-2 inhibitors- N/A SGLT-2 instructions:   Blood Thinner Instructions:N/A Aspirin Instructions: Last Dose:  Activity level:    Unable to exercise without shortness of breath  Anesthesia review: Hx: Pre-DIA,HTN,OSA(CPAP)  Patient denies shortness of breath, fever, cough and chest pain at PAT appointment   Patient verbalized understanding of instructions that were reviewed over the telephone.

## 2024-02-22 NOTE — Assessment & Plan Note (Signed)
 Gastric bypass surgery with subsequent weight loss should help [name] a lot to relieve this problem

## 2024-02-22 NOTE — Assessment & Plan Note (Signed)
 Continue with Coreg , furosemide 

## 2024-02-28 NOTE — Anesthesia Preprocedure Evaluation (Signed)
 Anesthesia Evaluation  Patient identified by MRN, date of birth, ID band Patient awake    Reviewed: Allergy & Precautions, NPO status , Patient's Chart, lab work & pertinent test results, reviewed documented beta blocker date and time   History of Anesthesia Complications Negative for: history of anesthetic complications  Airway Mallampati: I  TM Distance: >3 FB Neck ROM: Full    Dental no notable dental hx. (+) Teeth Intact, Dental Advisory Given   Pulmonary asthma , sleep apnea and Continuous Positive Airway Pressure Ventilation    Pulmonary exam normal breath sounds clear to auscultation       Cardiovascular hypertension, Pt. on medications and Pt. on home beta blockers + DOE  Normal cardiovascular exam Rhythm:Regular Rate:Normal     Neuro/Psych  Headaches PSYCHIATRIC DISORDERS Anxiety Depression       GI/Hepatic Neg liver ROS, PUD,GERD  Medicated,,  Endo/Other  diabetes, Type 2  Class 3 obesity (BMI 46)  Renal/GU negative Renal ROS     Musculoskeletal  (+) Arthritis ,  Fibromyalgia -  Abdominal   Peds  Hematology negative hematology ROS (+)   Anesthesia Other Findings Day of surgery medications reviewed with patient.  Reproductive/Obstetrics                              Anesthesia Physical Anesthesia Plan  ASA: 3  Anesthesia Plan: General   Post-op Pain Management: Tylenol  PO (pre-op)*   Induction: Intravenous  PONV Risk Score and Plan: 4 or greater and Scopolamine  patch - Pre-op, Aprepitant , Ondansetron , Dexamethasone , Midazolam  and Treatment may vary due to age or medical condition  Airway Management Planned: Oral ETT  Additional Equipment: None  Intra-op Plan:   Post-operative Plan: Extubation in OR  Informed Consent:   Plan Discussed with:   Anesthesia Plan Comments:          Anesthesia Quick Evaluation

## 2024-02-29 ENCOUNTER — Encounter (HOSPITAL_COMMUNITY)
Admission: RE | Disposition: A | Discharge status: Home / Self Care | Payer: Self-pay | Source: Ambulatory Visit | Attending: General Surgery

## 2024-02-29 ENCOUNTER — Other Ambulatory Visit: Payer: Self-pay

## 2024-02-29 ENCOUNTER — Encounter (HOSPITAL_COMMUNITY): Payer: Self-pay | Admitting: General Surgery

## 2024-02-29 ENCOUNTER — Telehealth (HOSPITAL_COMMUNITY): Payer: Self-pay | Admitting: Pharmacy Technician

## 2024-02-29 ENCOUNTER — Ambulatory Visit (HOSPITAL_COMMUNITY)
Admission: RE | Admit: 2024-02-29 | Discharge: 2024-03-01 | Disposition: A | Discharge status: Home / Self Care | Source: Ambulatory Visit | Attending: General Surgery | Admitting: General Surgery

## 2024-02-29 ENCOUNTER — Ambulatory Visit (HOSPITAL_COMMUNITY): Admitting: Anesthesiology

## 2024-02-29 ENCOUNTER — Other Ambulatory Visit (HOSPITAL_COMMUNITY): Payer: Self-pay

## 2024-02-29 DIAGNOSIS — K449 Diaphragmatic hernia without obstruction or gangrene: Secondary | ICD-10-CM | POA: Diagnosis not present

## 2024-02-29 DIAGNOSIS — I10 Essential (primary) hypertension: Secondary | ICD-10-CM | POA: Insufficient documentation

## 2024-02-29 DIAGNOSIS — Z6841 Body Mass Index (BMI) 40.0 and over, adult: Secondary | ICD-10-CM

## 2024-02-29 DIAGNOSIS — G4733 Obstructive sleep apnea (adult) (pediatric): Secondary | ICD-10-CM | POA: Insufficient documentation

## 2024-02-29 DIAGNOSIS — K432 Incisional hernia without obstruction or gangrene: Secondary | ICD-10-CM | POA: Insufficient documentation

## 2024-02-29 DIAGNOSIS — K2100 Gastro-esophageal reflux disease with esophagitis, without bleeding: Secondary | ICD-10-CM

## 2024-02-29 DIAGNOSIS — E119 Type 2 diabetes mellitus without complications: Secondary | ICD-10-CM | POA: Insufficient documentation

## 2024-02-29 DIAGNOSIS — Z9884 Bariatric surgery status: Secondary | ICD-10-CM

## 2024-02-29 DIAGNOSIS — M797 Fibromyalgia: Secondary | ICD-10-CM | POA: Insufficient documentation

## 2024-02-29 DIAGNOSIS — E66813 Obesity, class 3: Secondary | ICD-10-CM | POA: Insufficient documentation

## 2024-02-29 DIAGNOSIS — J45909 Unspecified asthma, uncomplicated: Secondary | ICD-10-CM | POA: Insufficient documentation

## 2024-02-29 DIAGNOSIS — Z01818 Encounter for other preprocedural examination: Secondary | ICD-10-CM

## 2024-02-29 HISTORY — PX: GASTRIC ROUX-EN-Y: SHX5262

## 2024-02-29 HISTORY — PX: LAPAROSCOPIC LYSIS OF ADHESIONS: SHX5905

## 2024-02-29 HISTORY — PX: INCISIONAL HERNIA REPAIR: SHX193

## 2024-02-29 HISTORY — PX: HIATAL HERNIA REPAIR: SHX195

## 2024-02-29 HISTORY — PX: UPPER GI ENDOSCOPY: SHX6162

## 2024-02-29 LAB — TYPE AND SCREEN
ABO/RH(D): AB POS
Antibody Screen: NEGATIVE

## 2024-02-29 LAB — HEMOGLOBIN AND HEMATOCRIT, BLOOD
HCT: 41.7 % (ref 36.0–46.0)
Hemoglobin: 13 g/dL (ref 12.0–15.0)

## 2024-02-29 SURGERY — LAPAROSCOPIC ROUX-EN-Y GASTRIC
Anesthesia: General

## 2024-02-29 MED ORDER — DEXMEDETOMIDINE HCL IN NACL 80 MCG/20ML IV SOLN
INTRAVENOUS | Status: AC
  Filled 2024-02-29: qty 20

## 2024-02-29 MED ORDER — FENTANYL CITRATE PF 50 MCG/ML IJ SOSY
PREFILLED_SYRINGE | INTRAMUSCULAR | Status: AC
  Filled 2024-02-29: qty 3

## 2024-02-29 MED ORDER — ALPRAZOLAM 0.5 MG PO TABS
1.0000 mg | ORAL_TABLET | Freq: Every day | ORAL | Status: DC
  Administered 2024-02-29: 1 mg via ORAL
  Filled 2024-02-29: qty 2

## 2024-02-29 MED ORDER — APREPITANT 40 MG PO CAPS
40.0000 mg | ORAL_CAPSULE | ORAL | Status: AC
  Administered 2024-02-29: 40 mg via ORAL
  Filled 2024-02-29: qty 1

## 2024-02-29 MED ORDER — VISTASEAL 4 ML SINGLE DOSE KIT
4.0000 mL | PACK | Freq: Once | CUTANEOUS | Status: AC
  Administered 2024-02-29: 4 mL via TOPICAL
  Filled 2024-02-29: qty 4

## 2024-02-29 MED ORDER — EPHEDRINE 5 MG/ML INJ
INTRAVENOUS | Status: AC
  Filled 2024-02-29: qty 5

## 2024-02-29 MED ORDER — OXYCODONE HCL 5 MG/5ML PO SOLN
ORAL | Status: AC
  Filled 2024-02-29: qty 5

## 2024-02-29 MED ORDER — AMLODIPINE BESYLATE 5 MG PO TABS
5.0000 mg | ORAL_TABLET | Freq: Every day | ORAL | Status: DC
  Administered 2024-03-01: 5 mg via ORAL
  Filled 2024-02-29: qty 1

## 2024-02-29 MED ORDER — ENSURE MAX PROTEIN PO LIQD
2.0000 [oz_av] | ORAL | Status: DC
  Administered 2024-03-01 (×2): 2 [oz_av] via ORAL

## 2024-02-29 MED ORDER — HYDROXYZINE HCL 25 MG PO TABS
25.0000 mg | ORAL_TABLET | Freq: Every day | ORAL | Status: DC
  Administered 2024-02-29: 25 mg via ORAL
  Filled 2024-02-29: qty 1

## 2024-02-29 MED ORDER — GABAPENTIN 100 MG PO CAPS
100.0000 mg | ORAL_CAPSULE | ORAL | Status: AC
  Administered 2024-02-29: 100 mg via ORAL
  Filled 2024-02-29: qty 1

## 2024-02-29 MED ORDER — PANTOPRAZOLE SODIUM 40 MG IV SOLR
40.0000 mg | Freq: Every day | INTRAVENOUS | Status: DC
  Administered 2024-02-29: 40 mg via INTRAVENOUS
  Filled 2024-02-29: qty 10

## 2024-02-29 MED ORDER — ROCURONIUM BROMIDE 10 MG/ML (PF) SYRINGE
PREFILLED_SYRINGE | INTRAVENOUS | Status: AC
  Filled 2024-02-29: qty 10

## 2024-02-29 MED ORDER — ORAL CARE MOUTH RINSE: 15.0000 mL | Freq: Once | OROMUCOSAL | Status: AC

## 2024-02-29 MED ORDER — LIDOCAINE HCL (CARDIAC) PF 100 MG/5ML IV SOSY
PREFILLED_SYRINGE | INTRAVENOUS | Status: DC | PRN
Start: 2024-02-29 — End: 2024-02-29
  Administered 2024-02-29: 100 mg via INTRAVENOUS

## 2024-02-29 MED ORDER — ENOXAPARIN (LOVENOX) PATIENT EDUCATION KIT
PACK | Freq: Once | Status: AC
  Filled 2024-02-29: qty 1

## 2024-02-29 MED ORDER — FIBRIN SEALANT 2 ML SINGLE DOSE KIT
2.0000 mL | PACK | Freq: Once | CUTANEOUS | Status: AC
  Administered 2024-02-29: 2 mL via TOPICAL
  Filled 2024-02-29: qty 2

## 2024-02-29 MED ORDER — OXYCODONE HCL 5 MG/5ML PO SOLN
5.0000 mg | Freq: Once | ORAL | Status: AC | PRN
  Administered 2024-02-29: 5 mg via ORAL

## 2024-02-29 MED ORDER — DEXAMETHASONE SODIUM PHOSPHATE 10 MG/ML IJ SOLN
INTRAMUSCULAR | Status: AC
  Filled 2024-02-29: qty 1

## 2024-02-29 MED ORDER — LACTATED RINGERS IR SOLN
Status: DC | PRN
  Administered 2024-02-29: 1000 mL

## 2024-02-29 MED ORDER — ONDANSETRON HCL 4 MG/2ML IJ SOLN
INTRAMUSCULAR | Status: DC | PRN
  Administered 2024-02-29: 4 mg via INTRAVENOUS

## 2024-02-29 MED ORDER — DEXAMETHASONE SODIUM PHOSPHATE 10 MG/ML IJ SOLN: 4.0000 mg | INTRAMUSCULAR | Status: DC

## 2024-02-29 MED ORDER — CARVEDILOL 6.25 MG PO TABS
6.2500 mg | ORAL_TABLET | Freq: Two times a day (BID) | ORAL | Status: DC
  Administered 2024-02-29 – 2024-03-01 (×2): 6.25 mg via ORAL
  Filled 2024-02-29 (×2): qty 1

## 2024-02-29 MED ORDER — ACETAMINOPHEN 500 MG PO TABS
1000.0000 mg | ORAL_TABLET | ORAL | Status: AC
  Administered 2024-02-29: 1000 mg via ORAL
  Filled 2024-02-29: qty 2

## 2024-02-29 MED ORDER — HEPARIN SODIUM (PORCINE) 5000 UNIT/ML IJ SOLN
5000.0000 [IU] | Freq: Three times a day (TID) | INTRAMUSCULAR | Status: DC
  Administered 2024-02-29 – 2024-03-01 (×2): 5000 [IU] via SUBCUTANEOUS
  Filled 2024-02-29 (×2): qty 1

## 2024-02-29 MED ORDER — 0.9 % SODIUM CHLORIDE (POUR BTL) OPTIME
TOPICAL | Status: DC | PRN
  Administered 2024-02-29: 1000 mL

## 2024-02-29 MED ORDER — DEXMEDETOMIDINE HCL IN NACL 80 MCG/20ML IV SOLN
INTRAVENOUS | Status: DC | PRN
  Administered 2024-02-29: 8 ug via INTRAVENOUS

## 2024-02-29 MED ORDER — ACETAMINOPHEN 500 MG PO TABS
1000.0000 mg | ORAL_TABLET | Freq: Three times a day (TID) | ORAL | Status: DC
  Administered 2024-02-29 – 2024-03-01 (×3): 1000 mg via ORAL
  Filled 2024-02-29 (×3): qty 2

## 2024-02-29 MED ORDER — SUMATRIPTAN SUCCINATE 50 MG PO TABS: 50.0000 mg | ORAL_TABLET | ORAL | Status: DC | PRN

## 2024-02-29 MED ORDER — BUPIVACAINE-EPINEPHRINE 0.25% -1:200000 IJ SOLN
INTRAMUSCULAR | Status: DC | PRN
  Administered 2024-02-29: 60 mL

## 2024-02-29 MED ORDER — PHENYLEPHRINE HCL (PRESSORS) 10 MG/ML IV SOLN
INTRAVENOUS | Status: DC | PRN
  Administered 2024-02-29 (×2): 40 ug via INTRAVENOUS
  Administered 2024-02-29: 80 ug via INTRAVENOUS
  Administered 2024-02-29: 40 ug via INTRAVENOUS
  Administered 2024-02-29 (×2): 80 ug via INTRAVENOUS
  Administered 2024-02-29: 40 ug via INTRAVENOUS
  Administered 2024-02-29 (×2): 80 ug via INTRAVENOUS

## 2024-02-29 MED ORDER — CHLORHEXIDINE GLUCONATE 4 % EX SOLN: Freq: Once | CUTANEOUS | Status: DC

## 2024-02-29 MED ORDER — MIDAZOLAM HCL 2 MG/2ML IJ SOLN
INTRAMUSCULAR | Status: AC
Start: 2024-02-29 — End: 2024-02-29
  Filled 2024-02-29: qty 2

## 2024-02-29 MED ORDER — HEPARIN SODIUM (PORCINE) 5000 UNIT/ML IJ SOLN
5000.0000 [IU] | INTRAMUSCULAR | Status: AC
  Administered 2024-02-29: 5000 [IU] via SUBCUTANEOUS
  Filled 2024-02-29: qty 1

## 2024-02-29 MED ORDER — ALBUTEROL SULFATE HFA 108 (90 BASE) MCG/ACT IN AERS
INHALATION_SPRAY | RESPIRATORY_TRACT | Status: AC
  Filled 2024-02-29: qty 6.7

## 2024-02-29 MED ORDER — PHENYLEPHRINE HCL (PRESSORS) 10 MG/ML IV SOLN
INTRAVENOUS | Status: AC
  Filled 2024-02-29: qty 1

## 2024-02-29 MED ORDER — OXYCODONE HCL 5 MG PO TABS: 5.0000 mg | ORAL_TABLET | Freq: Once | ORAL | Status: AC | PRN

## 2024-02-29 MED ORDER — BUDESONIDE 0.25 MG/2ML IN SUSP: 0.2500 mg | RESPIRATORY_TRACT | Status: DC | PRN

## 2024-02-29 MED ORDER — PROPOFOL 10 MG/ML IV BOLUS
INTRAVENOUS | Status: AC
  Filled 2024-02-29: qty 20

## 2024-02-29 MED ORDER — BUPIVACAINE-EPINEPHRINE (PF) 0.25% -1:200000 IJ SOLN
INTRAMUSCULAR | Status: AC
  Filled 2024-02-29: qty 60

## 2024-02-29 MED ORDER — SUGAMMADEX SODIUM 200 MG/2ML IV SOLN
INTRAVENOUS | Status: AC
  Filled 2024-02-29: qty 2

## 2024-02-29 MED ORDER — GABAPENTIN 100 MG PO CAPS
100.0000 mg | ORAL_CAPSULE | Freq: Two times a day (BID) | ORAL | Status: DC
  Administered 2024-02-29 – 2024-03-01 (×2): 100 mg via ORAL
  Filled 2024-02-29 (×2): qty 1

## 2024-02-29 MED ORDER — ONDANSETRON HCL 4 MG/2ML IJ SOLN
INTRAMUSCULAR | Status: AC
  Filled 2024-02-29: qty 2

## 2024-02-29 MED ORDER — LIDOCAINE HCL (PF) 2 % IJ SOLN
INTRAMUSCULAR | Status: AC
  Filled 2024-02-29: qty 5

## 2024-02-29 MED ORDER — SODIUM CHLORIDE 0.9 % IV SOLN: 12.5000 mg | Freq: Four times a day (QID) | INTRAVENOUS | Status: DC | PRN

## 2024-02-29 MED ORDER — OXYCODONE HCL 5 MG/5ML PO SOLN
5.0000 mg | Freq: Four times a day (QID) | ORAL | Status: DC | PRN
  Administered 2024-02-29 – 2024-03-01 (×2): 5 mg via ORAL
  Filled 2024-02-29 (×2): qty 5

## 2024-02-29 MED ORDER — OLMESARTAN-AMLODIPINE-HCTZ 40-5-12.5 MG PO TABS: 1.0000 | ORAL_TABLET | Freq: Every day | ORAL | Status: DC

## 2024-02-29 MED ORDER — HYDRALAZINE HCL 20 MG/ML IJ SOLN: 10.0000 mg | INTRAMUSCULAR | Status: DC | PRN

## 2024-02-29 MED ORDER — SIMETHICONE 80 MG PO CHEW
80.0000 mg | CHEWABLE_TABLET | Freq: Four times a day (QID) | ORAL | Status: DC | PRN
  Administered 2024-02-29: 80 mg via ORAL
  Filled 2024-02-29: qty 1

## 2024-02-29 MED ORDER — LACTATED RINGERS IV SOLN: INTRAVENOUS | Status: DC

## 2024-02-29 MED ORDER — KCL IN DEXTROSE-NACL 20-5-0.45 MEQ/L-%-% IV SOLN
INTRAVENOUS | Status: DC
  Filled 2024-02-29 (×5): qty 1000

## 2024-02-29 MED ORDER — CARMEX CLASSIC LIP BALM EX OINT
TOPICAL_OINTMENT | CUTANEOUS | Status: DC | PRN
  Filled 2024-02-29: qty 10

## 2024-02-29 MED ORDER — PHENYLEPHRINE HCL-NACL 20-0.9 MG/250ML-% IV SOLN
INTRAVENOUS | Status: DC | PRN
  Administered 2024-02-29: 40 ug/min via INTRAVENOUS

## 2024-02-29 MED ORDER — ACETAMINOPHEN 160 MG/5ML PO SOLN: 1000.0000 mg | Freq: Three times a day (TID) | ORAL | Status: DC

## 2024-02-29 MED ORDER — SODIUM CHLORIDE 0.9 % IV SOLN
2.0000 g | INTRAVENOUS | Status: AC
  Administered 2024-02-29: 2 g via INTRAVENOUS
  Filled 2024-02-29: qty 2

## 2024-02-29 MED ORDER — SCOPOLAMINE 1 MG/3DAYS TD PT72
1.0000 | MEDICATED_PATCH | TRANSDERMAL | Status: DC
  Filled 2024-02-29: qty 1

## 2024-02-29 MED ORDER — ONDANSETRON HCL 4 MG/2ML IJ SOLN: 4.0000 mg | Freq: Four times a day (QID) | INTRAMUSCULAR | Status: DC | PRN

## 2024-02-29 MED ORDER — CHLORHEXIDINE GLUCONATE 0.12 % MT SOLN
15.0000 mL | Freq: Once | OROMUCOSAL | Status: AC
  Administered 2024-02-29: 15 mL via OROMUCOSAL

## 2024-02-29 MED ORDER — PHENYLEPHRINE 80 MCG/ML (10ML) SYRINGE FOR IV PUSH (FOR BLOOD PRESSURE SUPPORT)
PREFILLED_SYRINGE | INTRAVENOUS | Status: AC
  Filled 2024-02-29: qty 10

## 2024-02-29 MED ORDER — AMISULPRIDE (ANTIEMETIC) 5 MG/2ML IV SOLN: 10.0000 mg | Freq: Once | INTRAVENOUS | Status: DC | PRN

## 2024-02-29 MED ORDER — ROCURONIUM BROMIDE 100 MG/10ML IV SOLN
INTRAVENOUS | Status: DC | PRN
  Administered 2024-02-29 (×2): 20 mg via INTRAVENOUS
  Administered 2024-02-29: 60 mg via INTRAVENOUS
  Administered 2024-02-29: 10 mg via INTRAVENOUS

## 2024-02-29 MED ORDER — PROPOFOL 10 MG/ML IV BOLUS
INTRAVENOUS | Status: DC | PRN
  Administered 2024-02-29: 200 mg via INTRAVENOUS

## 2024-02-29 MED ORDER — ALBUTEROL-BUDESONIDE 90-80 MCG/ACT IN AERO: 2.0000 | INHALATION_SPRAY | RESPIRATORY_TRACT | Status: DC | PRN

## 2024-02-29 MED ORDER — MORPHINE SULFATE (PF) 2 MG/ML IV SOLN
1.0000 mg | INTRAVENOUS | Status: DC | PRN
  Administered 2024-02-29: 2 mg via INTRAVENOUS
  Filled 2024-02-29: qty 1

## 2024-02-29 MED ORDER — HYDROCHLOROTHIAZIDE 12.5 MG PO TABS
12.5000 mg | ORAL_TABLET | Freq: Every day | ORAL | Status: DC
  Administered 2024-03-01: 12.5 mg via ORAL
  Filled 2024-02-29: qty 1

## 2024-02-29 MED ORDER — FENTANYL CITRATE (PF) 100 MCG/2ML IJ SOLN
INTRAMUSCULAR | Status: DC | PRN
  Administered 2024-02-29: 100 ug via INTRAVENOUS
  Administered 2024-02-29 (×2): 25 ug via INTRAVENOUS
  Administered 2024-02-29 (×2): 50 ug via INTRAVENOUS

## 2024-02-29 MED ORDER — FENTANYL CITRATE PF 50 MCG/ML IJ SOSY
25.0000 ug | PREFILLED_SYRINGE | INTRAMUSCULAR | Status: DC | PRN
  Administered 2024-02-29 (×2): 50 ug via INTRAVENOUS

## 2024-02-29 MED ORDER — DEXAMETHASONE SODIUM PHOSPHATE 10 MG/ML IJ SOLN
INTRAMUSCULAR | Status: DC | PRN
  Administered 2024-02-29: 4 mg

## 2024-02-29 MED ORDER — ALBUTEROL SULFATE (2.5 MG/3ML) 0.083% IN NEBU: 2.5000 mg | INHALATION_SOLUTION | RESPIRATORY_TRACT | Status: DC | PRN

## 2024-02-29 MED ORDER — SUGAMMADEX SODIUM 200 MG/2ML IV SOLN
INTRAVENOUS | Status: DC | PRN
  Administered 2024-02-29: 50 mg via INTRAVENOUS
  Administered 2024-02-29: 200 mg via INTRAVENOUS

## 2024-02-29 MED ORDER — EPHEDRINE SULFATE (PRESSORS) 50 MG/ML IJ SOLN
INTRAMUSCULAR | Status: DC | PRN
  Administered 2024-02-29 (×5): 5 mg via INTRAVENOUS

## 2024-02-29 MED ORDER — IRBESARTAN 150 MG PO TABS
300.0000 mg | ORAL_TABLET | Freq: Every day | ORAL | Status: DC
  Administered 2024-03-01: 300 mg via ORAL
  Filled 2024-02-29: qty 2

## 2024-02-29 MED ORDER — MIDAZOLAM HCL 5 MG/5ML IJ SOLN
INTRAMUSCULAR | Status: DC | PRN
  Administered 2024-02-29: 2 mg via INTRAVENOUS

## 2024-02-29 MED ORDER — STERILE WATER FOR IRRIGATION IR SOLN
Status: DC | PRN
  Administered 2024-02-29: 1000 mL

## 2024-02-29 MED ORDER — FENTANYL CITRATE (PF) 250 MCG/5ML IJ SOLN
INTRAMUSCULAR | Status: AC
  Filled 2024-02-29: qty 5

## 2024-02-29 SURGICAL SUPPLY — 59 items
APPLICATOR COTTON TIP 6 STRL (MISCELLANEOUS) IMPLANT
APPLICATOR VISTASEAL 35 (MISCELLANEOUS) ×4 IMPLANT
BAG COUNTER SPONGE SURGICOUNT (BAG) IMPLANT
BLADE SURG SZ11 CARB STEEL (BLADE) ×2 IMPLANT
CABLE HIGH FREQUENCY MONO STRZ (ELECTRODE) IMPLANT
CHLORAPREP W/TINT 26 (MISCELLANEOUS) ×4 IMPLANT
CLIP APPLIE ROT 13.4 12 LRG (CLIP) IMPLANT
CLIP SUT LAPRA TY ABSORB (SUTURE) ×4 IMPLANT
COVER SURGICAL LIGHT HANDLE (MISCELLANEOUS) ×2 IMPLANT
DEVICE SUT QUICK LOAD TK 5 (SUTURE) IMPLANT
DEVICE SUT TI-KNOT TK 5X26 (SUTURE) IMPLANT
DEVICE SUTURE ENDOST 10MM (ENDOMECHANICALS) ×2 IMPLANT
DEVICE TROCAR PUNCTURE CLOSURE (ENDOMECHANICALS) IMPLANT
DRAIN PENROSE 0.25X18 (DRAIN) ×2 IMPLANT
DRSG TEGADERM 2-3/8X2-3/4 SM (GAUZE/BANDAGES/DRESSINGS) ×12 IMPLANT
ELECT PENCIL ROCKER SW 15FT (MISCELLANEOUS) IMPLANT
ELECT REM PT RETURN 15FT ADLT (MISCELLANEOUS) ×2 IMPLANT
GAUZE 4X4 16PLY ~~LOC~~+RFID DBL (SPONGE) ×2 IMPLANT
GAUZE SPONGE 2X2 8PLY STRL LF (GAUZE/BANDAGES/DRESSINGS) ×2 IMPLANT
GAUZE SPONGE 4X4 12PLY STRL (GAUZE/BANDAGES/DRESSINGS) IMPLANT
GLOVE BIO SURGEON STRL SZ7.5 (GLOVE) ×2 IMPLANT
GLOVE INDICATOR 8.0 STRL GRN (GLOVE) ×2 IMPLANT
GOWN STRL REUS W/ TWL XL LVL3 (GOWN DISPOSABLE) ×2 IMPLANT
GRASPER SUT TROCAR 14GX15 (MISCELLANEOUS) IMPLANT
IRRIGATION SUCT STRKRFLW 2 WTP (MISCELLANEOUS) ×2 IMPLANT
KIT BASIN OR (CUSTOM PROCEDURE TRAY) ×2 IMPLANT
KIT GASTRIC LAVAGE 34FR ADT (SET/KITS/TRAYS/PACK) ×2 IMPLANT
KIT TURNOVER KIT A (KITS) ×2 IMPLANT
MARKER SKIN DUAL TIP RULER LAB (MISCELLANEOUS) ×2 IMPLANT
MAT PREVALON FULL STRYKER (MISCELLANEOUS) ×2 IMPLANT
NDL SPNL 22GX3.5 QUINCKE BK (NEEDLE) ×2 IMPLANT
NEEDLE SPNL 22GX3.5 QUINCKE BK (NEEDLE) ×2 IMPLANT
PACK CARDIOVASCULAR III (CUSTOM PROCEDURE TRAY) ×2 IMPLANT
RELOAD STAPLE 60 2.6 WHT THN (STAPLE) ×4 IMPLANT
RELOAD STAPLE 60 3.6 BLU REG (STAPLE) ×4 IMPLANT
RELOAD STAPLE 60 3.8 GOLD REG (STAPLE) ×2 IMPLANT
RELOAD SUT SNGL STCH ABSRB 2-0 (ENDOMECHANICALS) ×10 IMPLANT
RELOAD SUT SNGL STCH BLK 2-0 (ENDOMECHANICALS) ×8 IMPLANT
SCISSORS LAP 5X45 EPIX DISP (ENDOMECHANICALS) ×2 IMPLANT
SET TUBE SMOKE EVAC HIGH FLOW (TUBING) ×2 IMPLANT
SHEARS HARMONIC 45 ACE (MISCELLANEOUS) ×2 IMPLANT
SLEEVE ADV FIXATION 12X100MM (TROCAR) ×4 IMPLANT
SLEEVE ADV FIXATION 5X100MM (TROCAR) IMPLANT
SOLUTION ANTFG W/FOAM PAD STRL (MISCELLANEOUS) ×2 IMPLANT
STAPLER ECHELON BIOABSB 60 FLE (MISCELLANEOUS) IMPLANT
STAPLER ECHELON LONG 60 440 (INSTRUMENTS) ×2 IMPLANT
STRIP CLOSURE SKIN 1/2X4 (GAUZE/BANDAGES/DRESSINGS) ×2 IMPLANT
SURGILUBE 2OZ TUBE FLIPTOP (MISCELLANEOUS) ×2 IMPLANT
SUT MNCRL AB 4-0 PS2 18 (SUTURE) ×2 IMPLANT
SUT NOVA 0 T19/GS 22DT (SUTURE) IMPLANT
SUT SURGIDAC NAB ES-9 0 48 120 (SUTURE) IMPLANT
SUT VIC AB 2-0 SH 27X BRD (SUTURE) ×2 IMPLANT
SYR 20ML LL LF (SYRINGE) ×2 IMPLANT
TOWEL OR 17X26 10 PK STRL BLUE (TOWEL DISPOSABLE) ×2 IMPLANT
TRAY FOLEY MTR SLVR 16FR STAT (SET/KITS/TRAYS/PACK) IMPLANT
TROCAR ADV FIXATION 12X100MM (TROCAR) ×2 IMPLANT
TROCAR ADV FIXATION 5X100MM (TROCAR) ×2 IMPLANT
TROCAR XCEL NON-BLD 5MMX100MML (ENDOMECHANICALS) ×2 IMPLANT
TUBING CONNECTING 10 (TUBING) ×4 IMPLANT

## 2024-02-29 NOTE — H&P (Signed)
 PROVIDER: Adriena Manfre DARALYN BLUSH, MD  MRN: [id number] DOB: 03/01/1971 DATE OF ENCOUNTER: 02/17/2024 Subjective  Chief Complaint: Pre-op Exam (Pre op sx date 02/29/24 [facility] upper endo)   History of Present Illness: Comorbidities include hypertension, GERD with esophagitis, constipation, remote history of anemia, asthma, obstructive sleep apnea-not on CPAP  History of Present Illness [name] is a 53 year old female who presents for a follow-up regarding her upcoming gastric bypass surgery.  She is preparing for gastric bypass surgery and has been following the preoperative pathway since January. This included a cardiology evaluation with a stress test indicating low risk and a sleep study revealing mild sleep apnea. She has been using CPAP therapy, which has resulted in a slight increase in her energy levels.  She has a history of adrenal nodules, evaluated by an endocrinologist at [facility]. Blood analysis confirmed stability of the adrenal glands. No new gastrointestinal issues have been reported since the last visit. She takes Linzess  daily for bowel management and finds it helpful, and uses Protonix  occasionally for reflux.  She has undergone two myomectomies in the past, which may have resulted in abdominal scar tissue. She is concerned about potential scar tissue complications during the upcoming surgery. A small incisional hernia at the belly button was identified on a CT scan.  She is currently taking a bariatric multivitamin and calcium  supplements in preparation for the surgery. She has a history of B12 deficiency, but recent levels are normal. She plans to discontinue hormone pills a month before surgery and naproxen  five to seven days prior. She is also taking potassium supplements and is considering switching to a liquid form post-surgery.  She is concerned about potential hair loss following rapid weight loss post-surgery. She plans to maintain adequate protein intake and  continue her multivitamin to mitigate this. She is also preparing for the postoperative period by planning her return to work and understanding the dietary and lifestyle adjustments required after surgery.  Review of Systems: A complete review of systems was obtained from the patient. I have reviewed this information and discussed as appropriate with the patient. See HPI as well for other ROS.  ROS  Medical History: Past Medical History: Diagnosis Date Anemia Anxiety Arthritis Asthma, unspecified asthma severity, unspecified whether complicated, unspecified whether persistent (HHS-HCC) Hypertension  Patient Active Problem List Diagnosis Adrenal nodule (CMS/HHS-HCC) B12 deficiency Diastolic dysfunction Gastroesophageal reflux disease Hepatic steatosis OSA (obstructive sleep apnea) Prediabetes Primary hypertension  Past Surgical History: Procedure Laterality Date MASTECTOMY REDUCTION MAMMAPLASTY   Allergies Allergen Reactions Amlodipine  Other (See Comments) Ankle edema  Current Outpatient Medications on File Prior to Visit Medication Sig Dispense Refill AIRSUPRA  90-80 mcg/actuation HFAA Inhale 2 inhalations into the lungs every 4 (four) hours as needed ALPRAZolam  (XANAX ) 1 MG tablet TAKE 1 TABLET (1 MG TOTAL) BY MOUTH 3 TIMES/DAY AS NEEDED-BETWEEN MEALS & BEDTIME FOR ANXIETY. carvediloL  (COREG ) 6.25 MG tablet Take 6.25 mg by mouth cyclobenzaprine  (FLEXERIL ) 10 MG tablet Take 10 mg by mouth at bedtime KLOR-CON  M20 20 mEq ER tablet Take 1 tablet by mouth 2 (two) times daily linaCLOtide  (LINZESS ) 290 mcg capsule Take 290 mcg by mouth once daily M-NATAL PLUS tablet Take 1 tablet by mouth once daily naproxen  (NAPROSYN ) 500 MG tablet TAKE 1 TABLET BY MOUTH 2 TIMES DAILY WITH A MEAL. olmesartan -amLODIPine -hydroCHLOROthiazide  (TRIBENZOR) 40-5-12.5 mg tablet Take by mouth daily pantoprazole  (PROTONIX ) 40 MG DR tablet Take 1 tablet by mouth once  daily dextroamphetamine -amphetamine  (ADDERALL) 5 mg tablet Take 5 mg by mouth (Patient  not taking: Reported on 02/17/2024) ergocalciferol , vitamin D2, 1,250 mcg (50,000 unit) capsule Take 50,000 Units by mouth every 7 (seven) days (Patient not taking: Reported on 02/17/2024) FYAVOLV 1-5 mg-mcg tablet Take 1 tablet by mouth once daily (Patient not taking: Reported on 02/17/2024) LINZESS  145 mcg capsule Take 145 mcg by mouth (Patient not taking: Reported on 02/17/2024)  No current facility-administered medications on file prior to visit.  Family History Problem Relation Age of Onset High blood pressure (Hypertension) Father   Social History  Tobacco Use Smoking Status Never Smokeless Tobacco Never   Social History  Socioeconomic History Marital status: Single Tobacco Use Smoking status: Never Smokeless tobacco: Never Substance and Sexual Activity Alcohol use: Never Drug use: Never  Social Drivers of Catering manager Strain: Low Risk (10/09/2021) Received from [facility] Overall Financial Resource Strain (CARDIA) Difficulty of Paying Living Expenses: Not hard at all Food Insecurity: No Food Insecurity (10/09/2021) Received from [facility] Hunger Vital Sign Within the past 12 months, you worried that your food would run out before you got the money to buy more.: Never true Within the past 12 months, the food you bought just didn't last and you didn't have money to get more.: Never true Transportation Needs: No Transportation Needs (10/09/2021) Received from [facility] - Transportation Lack of Transportation (Medical): No Lack of Transportation (Non-Medical): No Physical Activity: Unknown (10/09/2021) Received from [facility] Exercise Vital Sign On average, how many days per week do you engage in moderate to strenuous exercise (like a brisk walk)?: 0 days Stress: No Stress Concern Present (10/09/2021) Received from [facility] of  Occupational Health - Occupational Stress Questionnaire Feeling of Stress : Only a little Received from [facility] Social Network Housing Stability: Unknown (08/12/2023) Housing Stability Vital Sign Homeless in the Last Year: No  Objective:  Vitals: 02/17/24 0924 BP: 138/84 Pulse: 90 Resp: 18 Temp: 36.8 C (98.3 F) SpO2: 99% Weight: (!) 122.3 kg (269 lb 9.6 oz) Height: 160 cm (5' 3) PainSc: 0-No pain  Body mass index is 47.76 kg/m.  Gen: alert, NAD, non-toxic appearing; severe obesity Pupils: equal, no scleral icterus Pulm: Lungs clear to auscultation, symmetric chest rise CV: regular rate and rhythm Abd: soft, nontender, nondistended. No cellulitis. No obvious incisional hernia but exam limited by body habitus. Old midline incision extending above the umbilicus going down to the pubis Ext: no edema, Skin: no rash, no jaundice  Labs, Imaging and Diagnostic Testing: Labs May 13, 2023-hemoglobin A1c 6.2; lipid panel Labs from December 2024 showed normal metanephrine and normetanephrine levels; normal potassium level, 24-hour urine cortisol levels were normal  08/24/23 - cxr; ugi ok Cardiology clinic note October 01, 2023 11/04/23- stress PET low risk cardiac perfusion November 04, 2023-mild obstructive sleep apnea with AHI 8.8/h; no central sleep apnea; some nocturnal hypoxemia  CT abdomen/pelvis June 2023  Assessment and Plan: Diagnoses and all orders for this visit:  Severe obesity (CMS/HHS-HCC)  Hypertension, unspecified type  History of myomectomy  Diastolic dysfunction  Prediabetes  Adrenal nodule (CMS/HHS-HCC)  Gastroesophageal reflux disease with esophagitis without hemorrhage  Obstructive sleep apnea syndrome, mild  Incisional hernia, without obstruction or gangrene   Assessment & Plan Severe obesity She is scheduled for gastric bypass surgery. Preoperative evaluations include a cardiology stress test indicating low risk and a sleep study  showing mild sleep apnea. She is nervous about the surgery, particularly concerning potential scar tissue from previous myomectomies. The surgeon explained the potential challenges with scar tissue  and the need to address a small incisional hernia during the procedure. She is aware of the dietary changes post-surgery and the importance of taking a bariatric multivitamin to prevent deficiencies. The surgeon discussed the potential for scar tissue complications, including the risk of bowel perforation if small bowel is adhered to the abdominal wall. The incisional hernia will be repaired during the procedure to prevent postoperative bowel obstruction. Patient read over the surgical consent form. All of her questions were asked and answered. She has attended her preop education class. We discussed the typical hospitalization and the typical recovery. Had opportunity to have her questions asked and answered. Discussed the importance of the preoperative meal plan. - Proceed with gastric bypass surgery. - Address small incisional hernia during surgery. - Ensure she takes bariatric multivitamin and calcium  post-surgery. - Monitor vitamin levels post-surgery for deficiencies. - Advise her to stop naproxen  5-7 days before surgery and switch to Tylenol  for pain management. - Educate her on the importance of sipping liquids post-surgery and provide necessary documentation for work accommodations. - Advise her on potential hair loss post-surgery and the importance of protein intake and multivitamin use. - Discuss potential fatigue and constipation post-surgery and the need for Linzess  and possibly additional GI medications. - Instruct her on preoperative meal plan and activity level.  Small incisional hernia A small incisional hernia was identified at her belly button, with a small knuckle of bowel protruding. This will need to be addressed during the gastric bypass surgery to prevent postoperative complications. -  Repair small incisional hernia during gastric bypass surgery.  Mild sleep apnea She has been using CPAP therapy, which has resulted in a slight improvement in her energy levels. The sleep study indicated mild sleep apnea. - Continue CPAP therapy.  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 This note has been created using automated tools and reviewed for accuracy by Mattheu Brodersen MCADAMS [name].  This patient encounter took 39 minutes today to perform the following: take history, perform exam, review outside records, interpret imaging, counsel the patient on their diagnosis and document encounter, findings & plan in the EHR  No follow-ups on file.  [name]. Tanda MD FACS General, Minimally Invasive, & Bariatric Surgery Electronically signed by Tanda Vanessa Armour, MD at 02/17/2024

## 2024-02-29 NOTE — Telephone Encounter (Signed)
 Patient Product/process development scientist completed.    The patient is insured through [facility]. Patient has [employer], may use a copay card, and/or apply for patient assistance if available.    Ran test claim for enoxaparin  (Lovenox ) 40 mg/0.4 ml inj and the current 14 day co-pay is $26.95.   This test claim was processed through [facility]- copay amounts may vary at other pharmacies due to pharmacy/plan contracts, or as the patient moves through the different stages of their insurance plan.     Reyes Sharps, CPHT Pharmacy Technician III Certified Patient Advocate [facility] Patient Advocate Team Direct Number: [phone]  Fax: [phone]

## 2024-02-29 NOTE — Plan of Care (Signed)

## 2024-02-29 NOTE — Progress Notes (Signed)
   02/29/24 2008  BiPAP/CPAP/SIPAP  BiPAP/CPAP/SIPAP Pt Type Adult (Pt brought machine from home)  Patient Home Machine Yes  Safety Check Completed by RT for Home Unit Yes, no issues noted  Patient Home Mask Yes  Patient Home Tubing Yes  Device Plugged into RED Power Outlet Yes

## 2024-02-29 NOTE — Addendum Note (Signed)
 Addendum  created 02/29/24 1448 by Niels Marien CROME, MD   Delete clinical note, Intraprocedure Blocks edited, Order Canceled from Note

## 2024-02-29 NOTE — Brief Op Note (Signed)
 02/29/2024  11:14 AM  PATIENT:  [name]  53 y.o. female  PRE-OPERATIVE DIAGNOSIS:  MORBID OBESITY  POST-OPERATIVE DIAGNOSIS:  MORBID OBESITY  PROCEDURE:  Procedure(s): LAPAROSCOPIC ROUX-EN-Y GASTRIC (N/A) ENDOSCOPY, UPPER GI TRACT (N/A) LYSIS, ADHESIONS, LAPAROSCOPIC LAP ASSISTED INCISIONAL HERNIA REPAIR  SURGEON:  Surgeons and Role:    DEWAINE Tanda Locus, MD - Primary    * Signe Mitzie LABOR, MD - Assisting  PHYSICIAN ASSISTANT:   ASSISTANTS: Mitzie Signe MD   ANESTHESIA:   general  EBL:  40 mL   BLOOD ADMINISTERED:none  DRAINS: none   LOCAL MEDICATIONS USED:  BUPIVICAINE   SPECIMEN:  No Specimen  DISPOSITION OF SPECIMEN:  N/A  COUNTS:  YES  TOURNIQUET:  * No tourniquets in log *  DICTATION: .Dragon Dictation  PLAN OF CARE: Admit for overnight observation  PATIENT DISPOSITION:  PACU - hemodynamically stable.   Delay start of Pharmacological VTE agent (>24hrs) due to surgical blood loss or risk of bleeding: no

## 2024-02-29 NOTE — Anesthesia Postprocedure Evaluation (Signed)
 Anesthesia Post Note  Patient: Glendola Friedhoff  Procedure(s) Performed: LAPAROSCOPIC ROUX-EN-Y GASTRIC ENDOSCOPY, UPPER GI TRACT LYSIS, ADHESIONS, LAPAROSCOPIC REPAIR, HERNIA, HIATAL, LAPAROSCOPIC REPAIR, HERNIA, UMBILICAL, LAPAROSCOPIC     Patient location during evaluation: PACU Anesthesia Type: General Level of consciousness: awake and alert Pain management: pain level controlled Vital Signs Assessment: post-procedure vital signs reviewed and stable Respiratory status: spontaneous breathing, nonlabored ventilation, respiratory function stable and patient connected to nasal cannula oxygen Cardiovascular status: blood pressure returned to baseline and stable Postop Assessment: no apparent nausea or vomiting Anesthetic complications: no   No notable events documented.  Last Vitals:  Vitals:   02/29/24 1225 02/29/24 1234  BP: (!) 144/95 (!) 143/73  Pulse: 75 73  Resp: 15 20  Temp: (!) 36.4 C 36.9 C  SpO2: 100% 98%    Last Pain:  Vitals:   02/29/24 1248  TempSrc:   PainSc: 10-Worst pain ever                 Rishav Rockefeller L Leo Weyandt

## 2024-02-29 NOTE — Op Note (Signed)
 Preoperative diagnosis: Roux-en-Y gastric bypass  Postoperative diagnosis: Same   Procedure: Upper endoscopy   Surgeon: Mitzie DELENA Freund, M.D.  Anesthesia: Gen.   Description of procedure: The endoscope was placed in the mouth and oropharynx and under endoscopic vision it was advanced to the esophagogastric junction which was identified at 39cm from the teeth.  The pouch was tensely insufflated while the upper abdomen was flooded with irrigation to perform a leak test, which was negative. No bubbles were seen.  The staple line was hemostatic and the anastomosis is intact, visibly patent and hemostatic. The pouch measures 6cm in length while fully insufflated. The lumen was decompressed and the scope was withdrawn without difficulty.    Mitzie DELENA Freund, M.D. General, Bariatric, & Minimally Invasive Surgery [facility]

## 2024-02-29 NOTE — Progress Notes (Signed)
 PHARMACY CONSULT FOR:  Risk Assessment for Post-Discharge VTE Following Bariatric Surgery  Procedure* Roux-en-y gastric bypass  Sex F  Black race Yes  Age (years) 53  BMI (kg/m2) 46  Operation duration (minutes) 181  History of VTE requiring treatment* No  Hypercoagulable condition* No   Liver disorder* No  Pre-op venous stasis No  Pre-op functional health status Independent   Previous foregut or bariatric surg No  Post-op surgical site infection No  Transfusion intra- or post-op* No  Unplanned readmission No  Unplanned reoperation No  GI perforation/leak/obstruction* No  *specific risk factors for portomesenteric venous thrombosis   Predicted probability of 30-day post-discharge VTE:    0.53 % estimated using the [facility] Calculator   Recommendation for Discharge: Enoxaparin  40 mg [place] q12h x 2 weeks post-discharge   Shabnam Ladd is a 53 y.o. female who underwent Roux-en-Y gastric bypass on 02/29/24.   Case start: 0815 Case end: 1116   No Known Allergies  Patient Measurements: Height: 5' 3 (160 cm) Weight: (P) 118.4 kg (261 lb) IBW/kg (Calculated) : 52.4 Body mass index is 46.23 kg/m (pended).  No results for input(s): WBC, HGB, HCT, PLT, APTT, CREATININE, LABCREA, CREAT24HRUR, MG, PHOS, ALBUMIN, PROT, AST, ALT, ALKPHOS, BILITOT, BILIDIR, IBILI in the last 72 hours. Estimated Creatinine Clearance: 100.8 mL/min (by C-G formula based on SCr of 0.55 mg/dL).    Past Medical History:  Diagnosis Date   Allergy    rhinitis   Anemia, unspecified    Anxiety    Arthritis    Asthma    asthma   Back pain    Colitis    Dyspnea    Fibromyalgia    Hypertension    Joint pain    Lactose intolerance    Leiomyoma of uterus, unspecified    Meralgia paresthetica    Nausea alone    Obesity    Other B-complex deficiencies    Pre-diabetes    Stomach ulcer    Umbilical hernia    Unspecified vitamin  D deficiency      Medications Prior to Admission  Medication Sig Dispense Refill Last Dose/Taking   acetaminophen  (TYLENOL ) 500 MG tablet Take 1,000 mg by mouth every 8 (eight) hours as needed for moderate pain (pain score 4-6).   02/28/2024 Morning   Albuterol -Budesonide  (AIRSUPRA ) 90-80 MCG/ACT AERO Inhale 2 Inhalations into the lungs every 4 (four) hours as needed. 10 g 5 Unknown   ALPRAZolam  (XANAX ) 1 MG tablet TAKE 1 TABLET (1 MG TOTAL) BY MOUTH 3 TIMES/DAY AS NEEDED-BETWEEN MEALS & BEDTIME FOR ANXIETY (Patient taking differently: Take 1 mg by mouth at bedtime.) 90 tablet 1 02/28/2024 Bedtime   carvedilol  (COREG ) 6.25 MG tablet Take 1 tablet (6.25 mg total) by mouth 2 (two) times daily with a meal. 60 tablet 11 02/29/2024 Morning   cholecalciferol  (VITAMIN D3) 25 MCG (1000 UNIT) tablet Take 1,000 Units by mouth daily.   Past Week   cyclobenzaprine  (FLEXERIL ) 10 MG tablet TAKE 1 TABLET BY MOUTH EVERYDAY AT BEDTIME 90 tablet 3 Past Week   furosemide  (LASIX ) 40 MG tablet TAKE 1 TABLET BY MOUTH DAILY AS NEEDED 90 tablet 1 Past Week   hydrOXYzine  (VISTARIL ) 25 MG capsule TAKE 1-2 CAPSULES BY MOUTH AT BEDTIME AS NEEDED (Patient taking differently: Take 25 mg by mouth at bedtime.) 180 capsule 1 Taking Differently   linaclotide  (LINZESS ) 290 MCG CAPS capsule Take 1 capsule (290 mcg total) by mouth daily before breakfast. 30 capsule 3 02/29/2024  Morning   naproxen  (NAPROSYN ) 500 MG tablet TAKE 1 TABLET BY MOUTH 2 TIMES DAILY WITH A MEAL. (Patient taking differently: Take 500 mg by mouth 2 (two) times daily as needed for moderate pain (pain score 4-6).) 60 tablet 3 Unknown   norethindrone-ethinyl estradiol (FYAVOLV) 1-5 MG-MCG TABS tablet Take 1 tablet by mouth daily.   Past Week   Olmesartan -amLODIPine -HCTZ 40-5-12.5 MG TABS TAKE 1 TABLET BY MOUTH EVERY DAY 90 tablet 2 02/28/2024 Morning   pantoprazole  (PROTONIX ) 40 MG tablet Take 1 tablet (40 mg total) by mouth daily. 90 tablet 1 02/29/2024 Morning    potassium chloride  SA (KLOR-CON  M) 20 MEQ tablet TAKE 1 TABLET BY MOUTH TWICE A DAY (Patient taking differently: Take 20 mEq by mouth daily.) 180 tablet 1 02/28/2024 Morning   Prenatal Vit-Fe Fumarate-FA (M-NATAL PLUS) 27-1 MG TABS TAKE 1 TABLET BY MOUTH EVERY DAY 90 tablet 1 Past Week   rizatriptan  (MAXALT ) 10 MG tablet TAKE 1 TABLET BY MOUTH ONCE AS NEEDED FOR UP TO 1 DOSE FOR MIGRAINE. MAY REPEAT IN 2 HOURS 12 tablet 5 Unknown   thiamine (VITAMIN B-1) 100 MG tablet Take 100 mg by mouth daily.   More than a month       Osie Iantha SQUIBB 02/29/2024,1:02 PM

## 2024-02-29 NOTE — Interval H&P Note (Signed)
 History and Physical Interval Note:  02/29/2024 7:27 AM  [name]  has presented today for surgery, with the diagnosis of MORBID OBESITY.  The various methods of treatment have been discussed with the patient and family. After consideration of risks, benefits and other options for treatment, the patient has consented to  Procedure(s): LAPAROSCOPIC ROUX-EN-Y GASTRIC (N/A) ENDOSCOPY, UPPER GI TRACT (N/A) as a surgical intervention.  The patient's history has been reviewed, patient examined, no change in status, stable for surgery.  I have reviewed the patient's chart and labs.  Questions were answered to the patient's satisfaction.     Camellia Blush

## 2024-02-29 NOTE — Op Note (Signed)
 [name] [id number] 1970/12/27. 02/29/2024  Preoperative diagnosis:  Severe obesity (BMI 46) Hypertension, unspecified type History of myomectomy Diastolic dysfunction Prediabetes Adrenal nodule (CMS/HHS-HCC) Gastroesophageal reflux disease with esophagitis without hemorrhage Obstructive sleep apnea syndrome, mild Incisional hernia, without obstruction or gangrene 2 cm  Postoperative  diagnosis:  1. same  Surgical procedure: Laparoscopic Roux-en-Y gastric bypass (ante-colic, ante-gastric); upper endoscopy; laparoscopic lysis of adhesions x 1 hour, laparoscopic assisted primary repair of incisional hernia; laparoscopic sliding hiatal hernia repair  Surgeon: Camellia CHRISTELLA Blush, M.D. FACS  Asst.: Mitzie Freund MD FACS  Anesthesia: General plus bupivacaine   Complications: None   EBL: Minimal   Drains: None   Disposition: PACU in good condition   Indications for procedure: 53 y.o. yo female with morbid obesity who has been unsuccessful at sustained weight loss. The patient's comorbidities are listed above. We discussed the risk and benefits of surgery including but not limited to anesthesia risk, bleeding, infection, blood clot formation, anastomotic leak, anastomotic stricture, ulcer formation, death, respiratory complications, intestinal blockage, internal hernia, gallstone formation, vitamin and nutritional deficiencies, injury to surrounding structures, failure to lose weight and mood changes.  Patient had a prior midline incision starting around umbilicus going down to her pelvis for a gynecological procedure.  Preoperative imaging showed a small incisional hernia containing a loop of small bowel nonobstructed at the level of the umbilicus.  We discussed that with the gastric bypass we would generally have to take this down in order to perform the gastric bypass.  Description of procedure: Patient is brought to the operating room and general anesthesia induced. The patient had  received preoperative broad-spectrum IV antibiotics and subcutaneous heparin . The abdomen was widely sterilely prepped with Chloraprep and draped. Patient timeout was performed and correct patient and procedure confirmed. Access was obtained with a 5 mm Optiview trocar in the left upper quadrant and pneumoperitoneum established without difficulty.  Patient had a fair amount of small bowel adhered in the midline going all the way up to the falciform ligament.  The right upper quadrant was free of adhesions.  I ended up placing 2 additional left-sided 5 mm trocars in the left lateral abdominal wall under direct visualization.  Then using EndoShears without electrocautery I started taking down the small bowel adhesions from the midline.  We looked in the lower midline and started coming up from the lower midline where some adhesions were lighter or dense to the abdominal wall.  By going back and forth between the upper midline and the lower midline and working back-and-forth it made it easier to identify the edges of the small bowel and the peritoneum.  The small bowel was taken down from the incisional hernia at the level of the umbilicus.  It took about an hour to take down all the small bowel from the anterior abdominal wall.  There was probably still a few loops adhered in the lower pelvis but at this point we felt we had taken down enough small bowel for the gastric bypass portion of the procedure.  We inspected the small bowel but we had taken down from the abdominal wall and there is no evidence of serosal tear or enterotomy.  A 12 mm trocar was placed through the umbilicus which contained the fascial defect.  We then identified the ligament of Treitz and I counted out the small bowel for the entire length that we would need for the gastric bypass and there was enough length and no adhesions and so we felt we could proceed  with the gastric bypass.  Under direct vision 12 mm trocars were placed laterally in the  right upper quadrant, right upper quadrant midclavicular line.  Local consisting of bupivacaine  was infiltrated in bilateral lateral abdominal walls as a TAP block for postoperative pain relief.  The omentum was brought into the upper abdomen and the transverse mesocolon elevated and the ligament of Treitz clearly identified.  There was some small bowel that was adhesed to the transverse colon omentum this was taken down with EndoShears as well.  A 50 cm biliopancreatic limb was then carefully measured from the ligament of Treitz. The small intestine was divided at this point with a single firing of the white load linear stapler. A Penrose drain was sutured to the end of the Roux-en-Y limb for later identification. A 100 cm Roux-en-Y limb was then carefully measured. At this point a side-to-side anastomosis was created between the Roux limb and the end of the biliopancreatic limb. This was accomplished with a single firing of the 60 mm white load linear stapler. The common enterotomy was closed with a running 2-0 Vicryl begun at either end of the enterotomy and tied centrally. Vistaseal  tissue sealant was placed over the anastomosis. The mesenteric defect was then closed with running 2-0 silk.  Her mesentery was a little bit friable.  The omentum was then divided with the harmonic scalpel up towards the transverse colon to allow mobility of the Roux limb toward the gastric pouch. The patient was then placed in steep reversed Trendelenburg. Through a 5 mm subxiphoid site the [facility] retractor was placed and the left lobe of the liver elevated with excellent exposure of the upper stomach and hiatus.  There appeared to be a sliding hiatal hernia.  We incised the gastrohepatic ligament with harmonic scalpel.  We visualized the right crus of the diaphragm.  Using the harmonic scalpel I carefully incised the peritoneum over the right crus staying in the plane between the perigastric/periesophageal tissue and the right  crus of the diaphragm.  Then using the Prestige grasper they did some blunt dissection and identified the left crus at the confluence with the right crus.  There was a gap between the left and right crus.  The esophagus was identified along with the posterior vagus nerve.  Using some blunt dissection I continued to clear up the right side of the right crus along with aid of harmonic scalpel.  At this time we placed a single 0 Ethibond Endo Stitch reapproximating the left and right crus with a titanium tie knot.   The angle of Hiss was then mobilized with the harmonic scalpel. A 6 cm gastric pouch was then carefully measured along the lesser curve of the stomach. Dissection was carried along the lesser curve at this point with the Harmonic scalpel working carefully back toward the lesser sac at right angles to the lesser curve. The free lesser sac was then entered. After being sure all tubes were removed from the stomach an initial firing of the gold load 60 mm linear stapler was fired at right angles across the lesser curve for about 4 cm. The gastric pouch was further mobilized posteriorly and then the pouch was completed with 4 further firings of the 60 mm blue load linear stapler up through the previously dissected angle of His. It was ensured that the pouch was completely mobilized away from the gastric remnant. This created a nice tubular 5-6 cm gastric pouch. The Roux limb was then brought up in an antecolic  fashion with the candycane facing to the patient's left without undue tension. The gastrojejunostomy was created with an initial posterior row of 2-0 Vicryl between the Roux limb and the staple line of the gastric pouch. Enterotomies were then made in the gastric pouch and the Roux limb with the harmonic scalpel and at approximately 2-2-1/2 cm anastomosis was created with a single firing of the 60mm blue load linear stapler. The staple line was inspected and was intact without bleeding. The common  enterotomy was then closed with running 2-0 Vicryl begun at either end and tied centrally. The Ewall tube was then easily passed through the anastomosis and an outer anterior layer of running 2-0 Vicryl was placed. The Ewald tube was removed. With the outlet of the gastrojejunostomy clamped and under saline irrigation the assistant performed upper endoscopy and with the gastric pouch tensely distended with air-there was no evidence of leak on this test. The pouch was desufflated. The Andra defect was closed with running 2-0 silk. The abdomen was inspected for any evidence of bleeding or bowel injury and everything looked fine.   I then went about closing the incisional hernia fascial defect at the umbilicus.  The 12 mm trocar was removed.  I plugged the defect with my finger while I placed 3 interrupted 0 Novofil sutures using PMI suture passer with laparoscopic assistance.  The defect was closed.  There is nothing trapped within it.  Some local was infiltrated.  The Nathanson retractor was removed under direct vision after coating the anastomosis with Vistaseal  tissue sealant. All CO2 was evacuated and trochars removed. Skin incisions were closed with 4-0 monocryl in a subcuticular fashion followed by steri-strips and bandages. Sponge needle and instrument counts were correct. The patient was taken to the PACU in good condition.    Camellia HERO. Tanda, MD, FACS General, Bariatric, & Minimally Invasive Surgery [facility]  [facility]

## 2024-02-29 NOTE — Anesthesia Procedure Notes (Deleted)
 Epidural

## 2024-02-29 NOTE — Transfer of Care (Signed)
 Immediate Anesthesia Transfer of Care Note  Patient: [name]  Procedure(s) Performed: LAPAROSCOPIC ROUX-EN-Y GASTRIC ENDOSCOPY, UPPER GI TRACT LYSIS, ADHESIONS, LAPAROSCOPIC REPAIR, HERNIA, HIATAL, LAPAROSCOPIC REPAIR, HERNIA, UMBILICAL, LAPAROSCOPIC  Patient Location: PACU  Anesthesia Type:General  Level of Consciousness: oriented, drowsy, and patient cooperative  Airway & Oxygen Therapy: Patient Spontanous Breathing and Patient connected to face mask oxygen  Post-op Assessment: Report given to RN and Post -op Vital signs reviewed and stable  Post vital signs: Reviewed and stable  Last Vitals:  Vitals Value Taken Time  BP    Temp    Pulse 71 02/29/24 11:33  Resp 14 02/29/24 11:33  SpO2 100 % 02/29/24 11:33    Last Pain:  Vitals:   02/29/24 0607  TempSrc:   PainSc: 0-No pain         Complications: No notable events documented.

## 2024-02-29 NOTE — Anesthesia Procedure Notes (Signed)
 Procedure Name: Intubation Date/Time: 02/29/2024 7:44 AM  Performed by: Kathern Rollene LABOR, CRNAPre-anesthesia Checklist: Patient identified, Emergency Drugs available, Suction available and Patient being monitored Patient Re-evaluated:Patient Re-evaluated prior to induction Oxygen Delivery Method: Circle system utilized Preoxygenation: Pre-oxygenation with 100% oxygen Induction Type: IV induction Ventilation: Mask ventilation without difficulty Laryngoscope Size: Mac and 3 Grade View: Grade III Tube type: Oral Tube size: 7.0 mm Number of attempts: 1 Airway Equipment and Method: Stylet and Oral airway Placement Confirmation: ETT inserted through vocal cords under direct vision, positive ETCO2 and breath sounds checked- equal and bilateral Secured at: 21 cm Tube secured with: Tape Dental Injury: Teeth and Oropharynx as per pre-operative assessment

## 2024-03-01 ENCOUNTER — Ambulatory Visit: Admitting: Gastroenterology

## 2024-03-01 ENCOUNTER — Other Ambulatory Visit (HOSPITAL_BASED_OUTPATIENT_CLINIC_OR_DEPARTMENT_OTHER): Payer: Self-pay

## 2024-03-01 ENCOUNTER — Other Ambulatory Visit: Payer: Self-pay

## 2024-03-01 ENCOUNTER — Encounter (HOSPITAL_COMMUNITY): Payer: Self-pay | Admitting: General Surgery

## 2024-03-01 ENCOUNTER — Other Ambulatory Visit (HOSPITAL_COMMUNITY): Payer: Self-pay

## 2024-03-01 LAB — CBC WITH DIFFERENTIAL/PLATELET
Abs Immature Granulocytes: 0.03 K/uL (ref 0.00–0.07)
Basophils Absolute: 0 K/uL (ref 0.0–0.1)
Basophils Relative: 0 %
Eosinophils Absolute: 0 K/uL (ref 0.0–0.5)
Eosinophils Relative: 0 %
HCT: 40.2 % (ref 36.0–46.0)
Hemoglobin: 12.5 g/dL (ref 12.0–15.0)
Immature Granulocytes: 0 %
Lymphocytes Relative: 14 %
Lymphs Abs: 1.2 K/uL (ref 0.7–4.0)
MCH: 25.6 pg — ABNORMAL LOW (ref 26.0–34.0)
MCHC: 31.1 g/dL (ref 30.0–36.0)
MCV: 82.4 fL (ref 80.0–100.0)
Monocytes Absolute: 0.8 K/uL (ref 0.1–1.0)
Monocytes Relative: 9 %
Neutro Abs: 6.7 K/uL (ref 1.7–7.7)
Neutrophils Relative %: 77 %
Platelets: 140 K/uL — ABNORMAL LOW (ref 150–400)
RBC: 4.88 MIL/uL (ref 3.87–5.11)
RDW: 14.8 % (ref 11.5–15.5)
WBC: 8.7 K/uL (ref 4.0–10.5)
nRBC: 0 % (ref 0.0–0.2)

## 2024-03-01 LAB — COMPREHENSIVE METABOLIC PANEL WITH GFR
ALT: 74 U/L — ABNORMAL HIGH (ref 0–44)
AST: 68 U/L — ABNORMAL HIGH (ref 15–41)
Albumin: 3.3 g/dL — ABNORMAL LOW (ref 3.5–5.0)
Alkaline Phosphatase: 56 U/L (ref 38–126)
Anion gap: 9 (ref 5–15)
BUN: 6 mg/dL (ref 6–20)
CO2: 25 mmol/L (ref 22–32)
Calcium: 8.7 mg/dL — ABNORMAL LOW (ref 8.9–10.3)
Chloride: 106 mmol/L (ref 98–111)
Creatinine, Ser: 0.59 mg/dL (ref 0.44–1.00)
GFR, Estimated: >60 mL/min (ref >60)
Glucose, Bld: 139 mg/dL — ABNORMAL HIGH (ref 70–99)
Potassium: 3.9 mmol/L (ref 3.5–5.1)
Sodium: 140 mmol/L (ref 135–145)
Total Bilirubin: 0.4 mg/dL (ref 0.0–1.2)
Total Protein: 6.9 g/dL (ref 6.5–8.1)

## 2024-03-01 MED ORDER — ONDANSETRON 4 MG PO TBDP
4.0000 mg | ORAL_TABLET | Freq: Four times a day (QID) | ORAL | 0 refills | Status: AC | PRN
  Filled 2024-03-01: qty 20, 5d supply, fill #0

## 2024-03-01 MED ORDER — ENOXAPARIN SODIUM 40 MG/0.4ML IJ SOSY
40.0000 mg | PREFILLED_SYRINGE | Freq: Two times a day (BID) | INTRAMUSCULAR | 0 refills | Status: AC
  Filled 2024-03-01: qty 26, 32d supply, fill #0
  Filled 2024-03-01: qty 2, 3d supply, fill #0

## 2024-03-01 MED ORDER — GABAPENTIN 100 MG PO CAPS
100.0000 mg | ORAL_CAPSULE | Freq: Two times a day (BID) | ORAL | 0 refills | Status: DC
  Filled 2024-03-01: qty 10, 5d supply, fill #0

## 2024-03-01 MED ORDER — OXYCODONE HCL 5 MG PO TABS
5.0000 mg | ORAL_TABLET | Freq: Four times a day (QID) | ORAL | 0 refills | Status: AC | PRN
  Filled 2024-03-01: qty 5, 2d supply, fill #0

## 2024-03-01 MED ORDER — ACETAMINOPHEN 500 MG PO TABS: 1000.0000 mg | ORAL_TABLET | Freq: Three times a day (TID) | ORAL | Status: AC

## 2024-03-01 NOTE — Progress Notes (Signed)
 Patient alert and oriented, pain is controlled. Patient is tolerating fluids, advanced to protein shake today, patient is tolerating well. Reviewed Gastric sleeve/bypass discharge instructions with patient and patient is able to articulate understanding. Provided information on BELT program, Support Group, BSTOP-D, and [facility] outpatient pharmacy. Communicated general update of patient status to surgeon. All questions answered. 24hr fluid recall is 750 mL  per hydration protocol, bariatric nurse coordinator to make follow-up phone call within one week.   Thank you,  Roseann Medley, RN, MSN Bariatric Nurse Coordinator [phone] (office)

## 2024-03-01 NOTE — Progress Notes (Signed)
Pt was discharged home today. Instructions were reviewed with patient, and questions were answered. Pt was taken to main entrance via wheelchair by NT.  

## 2024-03-01 NOTE — Plan of Care (Signed)

## 2024-03-01 NOTE — Discharge Instructions (Signed)

## 2024-03-01 NOTE — Progress Notes (Signed)
   03/01/24 1108  TOC Brief Assessment  Insurance and Status Reviewed  Patient has primary care physician Yes  Home environment has been reviewed home alone  Prior level of function: independent  Prior/Current Home Services No current home services  Social Drivers of Health Review SDOH reviewed no interventions necessary  Readmission risk has been reviewed Yes  Transition of care needs no transition of care needs at this time

## 2024-03-01 NOTE — Plan of Care (Signed)
  Problem: Education: Goal: Knowledge of General Education information will improve Description: Including pain rating scale, medication(s)/side effects and non-pharmacologic comfort measures Outcome: Adequate for Discharge   Problem: Health Behavior/Discharge Planning: Goal: Ability to manage health-related needs will improve Outcome: Adequate for Discharge   Problem: Clinical Measurements: Goal: Ability to maintain clinical measurements within normal limits will improve Outcome: Adequate for Discharge Goal: Will remain free from infection Outcome: Adequate for Discharge Goal: Diagnostic test results will improve Outcome: Adequate for Discharge Goal: Respiratory complications will improve Outcome: Adequate for Discharge Goal: Cardiovascular complication will be avoided Outcome: Adequate for Discharge   Problem: Activity: Goal: Risk for activity intolerance will decrease Outcome: Adequate for Discharge   Problem: Nutrition: Goal: Adequate nutrition will be maintained Outcome: Adequate for Discharge   Problem: Coping: Goal: Level of anxiety will decrease Outcome: Adequate for Discharge   Problem: Elimination: Goal: Will not experience complications related to bowel motility Outcome: Adequate for Discharge Goal: Will not experience complications related to urinary retention Outcome: Adequate for Discharge   Problem: Pain Managment: Goal: General experience of comfort will improve and/or be controlled Outcome: Adequate for Discharge   Problem: Safety: Goal: Ability to remain free from injury will improve Outcome: Adequate for Discharge   Problem: Skin Integrity: Goal: Risk for impaired skin integrity will decrease Outcome: Adequate for Discharge   Problem: Education: Goal: Ability to state signs and symptoms to report to health care provider will improve Outcome: Adequate for Discharge Goal: Knowledge of the prescribed self-care regimen will improve Outcome:  Adequate for Discharge Goal: Knowledge of discharge needs will improve Outcome: Adequate for Discharge   Problem: Activity: Goal: Ability to tolerate increased activity will improve Outcome: Adequate for Discharge   Problem: Bowel/Gastric: Goal: Gastrointestinal status for postoperative course will improve Outcome: Adequate for Discharge Goal: Occurrences of nausea will decrease Outcome: Adequate for Discharge   Problem: Coping: Goal: Development of coping mechanisms to deal with changes in body function or appearance will improve Outcome: Adequate for Discharge   Problem: Fluid Volume: Goal: Maintenance of adequate hydration will improve Outcome: Adequate for Discharge   Problem: Nutritional: Goal: Nutritional status will improve Outcome: Adequate for Discharge   Problem: Clinical Measurements: Goal: Will show no signs or symptoms of venous thromboembolism Outcome: Adequate for Discharge Goal: Will remain free from infection Outcome: Adequate for Discharge Goal: Will show no signs of GI Leak Outcome: Adequate for Discharge   Problem: Respiratory: Goal: Will regain and/or maintain adequate ventilation Outcome: Adequate for Discharge   Problem: Pain Management: Goal: Pain level will decrease Outcome: Adequate for Discharge   Problem: Skin Integrity: Goal: Demonstration of wound healing without infection will improve Outcome: Adequate for Discharge

## 2024-03-02 ENCOUNTER — Other Ambulatory Visit: Payer: Self-pay

## 2024-03-03 ENCOUNTER — Other Ambulatory Visit (HOSPITAL_COMMUNITY): Payer: Self-pay

## 2024-03-04 ENCOUNTER — Encounter: Payer: Self-pay | Admitting: Gastroenterology

## 2024-03-04 ENCOUNTER — Encounter: Payer: Self-pay | Admitting: Internal Medicine

## 2024-03-08 ENCOUNTER — Telehealth (HOSPITAL_COMMUNITY): Payer: Self-pay | Admitting: *Deleted

## 2024-03-08 NOTE — Telephone Encounter (Signed)
 1. Tell me about your pain and pain management?     Pt did not any pain.   2. Let's talk about fluid intake. How much total fluid are you taking in?    Pt states that she is working to meet goal of 64 oz of fluid today. Pt has been able to consume approx. 60 oz of fluid per day since surgery. Pt plans to increase clear liquids to meet fluid goals.    3. How much protein have you taken in the last day?    Pt states she is meeting the goal of 60g of protein each day with the protein shakes.  4. Have you had nausea? Tell me about when you have experienced nausea and what you did to help?   Pt denies nausea.   5. Has the frequency or color changed with your urine?   Pt states that s/he is urinating fine with no changes in frequency or urgency.   6. Tell me what your incisions look like?   Incisions look fine. Pt denies a fever, chills. Pt states incisions are not swollen, open, or draining. Pt encouraged to call [facility] if incisions change.   7. Have you been passing gas? BM?   Pt states that they are having BMs.   Pt states that they have had a BM. Pt instructed to take either Miralax or MoM as instructed per Gastric Bypass/Sleeve Discharge Home Care Instructions. Pt to call surgeon's office if not able to have BM with medication.    8. If a problem or question were to arise who would you call? Do you know contact numbers for [facility], [facility], and [facility]?   Pt knows to call [facility] for surgical, [facility] for nutrition, and [facility] for non-urgent questions or concerns. Pt denies dehydration symptoms. Pt can describe s/sx of dehydration.   9. How has the walking going?   Pt states s/he is walking around and able to be active without difficulty.   10. How are your vitamins and calcium  going? How are you taking them?    Pt states that s/he is taking his/her supplements and vitamins without difficulty.   12. How has the anticoagulant Lovenox  been going?   LOVENOX : Pt states that s/he is taking  the Lovenox  injections without difficulty. Reinforced education about taking injections q12h and rotating injection sites. Pt also instructed to monitor for unusual bruising and/or signs of bleeding.

## 2024-03-14 ENCOUNTER — Encounter: Payer: Self-pay | Admitting: Internal Medicine

## 2024-03-14 ENCOUNTER — Ambulatory Visit: Admitting: Internal Medicine

## 2024-03-14 VITALS — BP 120/80 | HR 72 | Temp 98.5°F | Ht 63.0 in | Wt 255.0 lb

## 2024-03-14 DIAGNOSIS — F5101 Primary insomnia: Secondary | ICD-10-CM

## 2024-03-14 DIAGNOSIS — R600 Localized edema: Secondary | ICD-10-CM | POA: Diagnosis not present

## 2024-03-14 DIAGNOSIS — R140 Abdominal distension (gaseous): Secondary | ICD-10-CM | POA: Diagnosis not present

## 2024-03-14 LAB — CBC WITH DIFFERENTIAL/PLATELET
Basophils Absolute: 0 K/uL (ref 0.0–0.1)
Basophils Relative: 0.6 % (ref 0.0–3.0)
Eosinophils Absolute: 0.2 K/uL (ref 0.0–0.7)
Eosinophils Relative: 2.9 % (ref 0.0–5.0)
HCT: 37.3 % (ref 36.0–46.0)
Hemoglobin: 12 g/dL (ref 12.0–15.0)
Lymphocytes Relative: 26.7 % (ref 12.0–46.0)
Lymphs Abs: 1.5 K/uL (ref 0.7–4.0)
MCHC: 32.2 g/dL (ref 30.0–36.0)
MCV: 79.9 fl (ref 78.0–100.0)
Monocytes Absolute: 0.5 K/uL (ref 0.1–1.0)
Monocytes Relative: 9.8 % (ref 3.0–12.0)
Neutro Abs: 3.3 K/uL (ref 1.4–7.7)
Neutrophils Relative %: 60 % (ref 43.0–77.0)
Platelets: 145 K/uL — ABNORMAL LOW (ref 150.0–400.0)
RBC: 4.67 Mil/uL (ref 3.87–5.11)
RDW: 15.5 % (ref 11.5–15.5)
WBC: 5.5 K/uL (ref 4.0–10.5)

## 2024-03-14 LAB — COMPREHENSIVE METABOLIC PANEL WITH GFR
ALT: 41 U/L — ABNORMAL HIGH (ref 0–35)
AST: 26 U/L (ref 0–37)
Albumin: 3.9 g/dL (ref 3.5–5.2)
Alkaline Phosphatase: 60 U/L (ref 39–117)
BUN: 9 mg/dL (ref 6–23)
CO2: 29 meq/L (ref 19–32)
Calcium: 9.3 mg/dL (ref 8.4–10.5)
Chloride: 102 meq/L (ref 96–112)
Creatinine, Ser: 0.57 mg/dL (ref 0.40–1.20)
GFR: 103.26 mL/min (ref >60.00)
Glucose, Bld: 76 mg/dL (ref 70–99)
Potassium: 4 meq/L (ref 3.5–5.1)
Sodium: 141 meq/L (ref 135–145)
Total Bilirubin: 0.3 mg/dL (ref 0.2–1.2)
Total Protein: 6.8 g/dL (ref 6.0–8.3)

## 2024-03-14 NOTE — Assessment & Plan Note (Signed)
 Panel 1: LAPAROSCOPIC ROUX-EN-Y GASTRIC with Tanda Locus, MD  Panel 1: ENDOSCOPY, UPPER GI TRACT with Tanda Locus, MD  Panel 1: LYSIS, ADHESIONS, LAPAROSCOPIC with Tanda Locus, MD  Panel 1: REPAIR, HERNIA, HIATAL, LAPAROSCOPIC with Tanda Locus, MD  Panel 1: REPAIR, HERNIA, UMBILICAL, LAPAROSCOPIC with Tanda Locus, MD

## 2024-03-14 NOTE — Patient Instructions (Signed)
 Panel 1: LAPAROSCOPIC ROUX-EN-Y GASTRIC with Tanda Locus, MD  Panel 1: ENDOSCOPY, UPPER GI TRACT with Tanda Locus, MD  Panel 1: LYSIS, ADHESIONS, LAPAROSCOPIC with Tanda Locus, MD  Panel 1: REPAIR, HERNIA, HIATAL, LAPAROSCOPIC with Tanda Locus, MD  Panel 1: REPAIR, HERNIA, UMBILICAL, LAPAROSCOPIC with Tanda Locus, MD

## 2024-03-14 NOTE — Assessment & Plan Note (Addendum)
 Resolving post-op and w/wt loss  Panel 1: LAPAROSCOPIC ROUX-EN-Y GASTRIC with Tanda Locus, MD  Panel 1: ENDOSCOPY, UPPER GI TRACT with Tanda Locus, MD  Panel 1: LYSIS, ADHESIONS, LAPAROSCOPIC with Tanda Locus, MD  Panel 1: REPAIR, HERNIA, HIATAL, LAPAROSCOPIC with Tanda Locus, MD  Panel 1: REPAIR, HERNIA, UMBILICAL, LAPAROSCOPIC with Tanda Locus, MD

## 2024-03-14 NOTE — Discharge Summary (Signed)
 Physician Discharge Summary  [name] FMW:[id number] DOB: [birth year] DOA: 02/29/2024  PCP: Garald Karlynn GAILS, MD  Admit date: 02/29/2024 Discharge date: 03/01/2024  Recommendations for Outpatient Follow-up:     Follow-up Information     Tanda Locus, MD Follow up on 03/30/2024.   Specialty: General Surgery Why: Please arrive 15 minutes prior to your appointment at 9:30am Contact information: [address] [place] [address] [phone]         [name], [place] Follow up on 04/26/2024.   Specialty: General Surgery Why: Please arrive 15 minutes prior to your appointment at 2:15pm with P. Gosai on behalf of Dr. Tanda Pass information: [address] [facility] [place] [address] [phone]                Discharge Diagnoses:  Principal Problem:   Gastric bypass status for obesity Severe obesity (BMI 46) Hypertension, unspecified type History of myomectomy Diastolic dysfunction Prediabetes Adrenal nodule (CMS/HHS-HCC) Gastroesophageal reflux disease with esophagitis without hemorrhage Obstructive sleep apnea syndrome, mild Incisional hernia, without obstruction or gangrene 2 cm   Surgical Procedure: Laparoscopic Roux-en-Y gastric bypass (ante-colic, ante-gastric); upper endoscopy; laparoscopic lysis of adhesions x 1 hour, laparoscopic assisted primary repair of incisional hernia; laparoscopic sliding hiatal hernia repair   Discharge Condition: Good Disposition: Home  Diet recommendation: Postoperative gastric bypass diet  Filed Weights   02/29/24 0618  Weight: (P) 118.4 kg     Hospital Course:  The patient was admitted for a planned laparoscopic Roux-en-Y gastric bypass. Please see operative note. Preoperatively the patient was given 5000 units of subcutaneous heparin  for DVT prophylaxis. ERAS protocol was used. Postoperative prophylactic heparin  dosing was started on the evening of  postoperative day 0.  The patient was started on ice chips and water  on the evening of POD 0 which they tolerated. On postoperative day 1 The patient's diet was advanced to protein shakes which they also tolerated. On POD 1, The patient was ambulating without difficulty. Their vital signs are stable without fever or tachycardia. Their hemoglobin had remained stable. . The patient had received discharge instructions and counseling. They were deemed stable for discharge.  Predicted probability of 30-day post-discharge VTE:    0.53 % estimated using the [facility] Calculator     Recommendation for Discharge: Enoxaparin  40 mg [place] q12h x 2 weeks post-discharge  BP 138/76 (BP Location: Right Arm)   Pulse 72   Temp 97.7 F (36.5 C) (Oral)   Resp 16   Ht 5' 3 (1.6 m)   Wt (P) 118.4 kg   LMP  (LMP Unknown)   SpO2 99%   BMI (P) 46.23 kg/m   Gen: alert, NAD, non-toxic appearing Pupils: equal, no scleral icterus Pulm: Lungs clear to auscultation, symmetric chest rise CV: regular rate and rhythm Abd: soft, min tender, nondistended. No cellulitis. No incisional hernia Ext: no edema, no calf tenderness Skin: no rash, no jaundice  Discharge Instructions  Discharge Instructions     Ambulate hourly while awake   Complete by: As directed    Call MD for:  difficulty breathing, headache or visual disturbances   Complete by: As directed    Call MD for:  persistant dizziness or light-headedness   Complete by: As directed    Call MD for:  persistant nausea and vomiting   Complete by: As directed    Call MD for:  redness, tenderness, or signs of infection (pain, swelling,  redness, odor or green/yellow discharge around incision site)   Complete by: As directed    Call MD for:  severe uncontrolled pain   Complete by: As directed    Call MD for:  temperature >101 F   Complete by: As directed    Diet bariatric full liquid   Complete by: As directed    Discharge  instructions   Complete by: As directed    See bariatric discharge instructions   Incentive spirometry   Complete by: As directed    Perform hourly while awake      Allergies as of 03/01/2024   No Known Allergies      Medication List     PAUSE taking these medications    Fyavolv 1-5 MG-MCG Tabs tablet Wait to take this until: March 28, 2024 Generic drug: norethindrone-ethinyl estradiol Take 1 tablet by mouth daily.       STOP taking these medications    acetaminophen  500 MG tablet Commonly known as: TYLENOL    naproxen  500 MG tablet Commonly known as: NAPROSYN        TAKE these medications    Airsupra  90-80 MCG/ACT Aero Generic drug: Albuterol -Budesonide  Inhale 2 Inhalations into the lungs every 4 (four) hours as needed.   ALPRAZolam  1 MG tablet Commonly known as: XANAX  TAKE 1 TABLET (1 MG TOTAL) BY MOUTH 3 TIMES/DAY AS NEEDED-BETWEEN MEALS & BEDTIME FOR ANXIETY What changed: when to take this   carvedilol  6.25 MG tablet Commonly known as: COREG  Take 1 tablet (6.25 mg total) by mouth 2 (two) times daily with a meal. Notes to patient: Monitor Blood Pressure Daily and keep a log for primary care physician.  You may need to make changes to your medications with rapid weight loss.      cholecalciferol  25 MCG (1000 UNIT) tablet Commonly known as: VITAMIN D3 Take 1,000 Units by mouth daily.   cyclobenzaprine  10 MG tablet Commonly known as: FLEXERIL  TAKE 1 TABLET BY MOUTH EVERYDAY AT BEDTIME   enoxaparin  40 MG/0.4ML injection Commonly known as: LOVENOX  Inject 0.4 mLs (40 mg total) into the skin every 12 (twelve) hours.   furosemide  40 MG tablet Commonly known as: LASIX  TAKE 1 TABLET BY MOUTH DAILY AS NEEDED Notes to patient: Monitor Blood Pressure Daily and keep a log for primary care physician.  Monitor for symptoms of dehydration.  You may need to make changes to your medications with rapid weight loss.      gabapentin  100 MG capsule Commonly known as:  NEURONTIN  Take 1 capsule (100 mg total) by mouth every 12 (twelve) hours for 5 days.   hydrOXYzine  25 MG capsule Commonly known as: VISTARIL  TAKE 1-2 CAPSULES BY MOUTH AT BEDTIME AS NEEDED What changed:  how much to take how to take this when to take this additional instructions   linaclotide  290 MCG Caps capsule Commonly known as: Linzess  Take 1 capsule (290 mcg total) by mouth daily before breakfast.   M-Natal Plus 27-1 MG Tabs TAKE 1 TABLET BY MOUTH EVERY DAY   Olmesartan -amLODIPine -HCTZ 40-5-12.5 MG Tabs TAKE 1 TABLET BY MOUTH EVERY DAY Notes to patient: Monitor Blood Pressure Daily and keep a log for primary care physician.  You may need to make changes to your medications with rapid weight loss.      ondansetron  4 MG disintegrating tablet Commonly known as: ZOFRAN -ODT Dissolve 1 tablet (4 mg total) by mouth every 6 (six) hours as needed for nausea or vomiting.   oxyCODONE  5 MG immediate release tablet Commonly known  as: Oxy IR/ROXICODONE  Take 1 tablet (5 mg total) by mouth every 6 (six) hours as needed for breakthrough pain or severe pain (pain score 7-10).   pantoprazole  40 MG tablet Commonly known as: PROTONIX  Take 1 tablet (40 mg total) by mouth daily.   potassium chloride  SA 20 MEQ tablet Commonly known as: KLOR-CON  M TAKE 1 TABLET BY MOUTH TWICE A DAY What changed: when to take this   rizatriptan  10 MG tablet Commonly known as: MAXALT  TAKE 1 TABLET BY MOUTH ONCE AS NEEDED FOR UP TO 1 DOSE FOR MIGRAINE. MAY REPEAT IN 2 HOURS   thiamine 100 MG tablet Commonly known as: Vitamin B-1 Take 100 mg by mouth daily.       ASK your doctor about these medications    acetaminophen  500 MG tablet Commonly known as: TYLENOL  Take 2 tablets (1,000 mg total) by mouth every 8 (eight) hours for 5 days. Ask about: Should I take this medication?        Follow-up Information     Tanda Locus, MD Follow up on 03/30/2024.   Specialty: General Surgery Why: Please  arrive 15 minutes prior to your appointment at 9:30am Contact information: [address] [place] [address] [phone]         [name], [place] Follow up on 04/26/2024.   Specialty: General Surgery Why: Please arrive 15 minutes prior to your appointment at 2:15pm with MYRTIS Williams on behalf of Dr. Tanda Pass information: [address] [facility] [place] [address] [phone]                  The results of significant diagnostics from this hospitalization (including imaging, microbiology, ancillary and laboratory) are listed below for reference.    Significant Diagnostic Studies: No results found.  Labs:    Latest Ref Rng & Units 03/01/2024    4:14 AM 02/22/2024    8:38 AM 03/20/2023    9:13 AM  BMP  Glucose 70 - 99 mg/dL 860  888  898   BUN 6 - 20 mg/dL 6  12  11    Creatinine 0.44 - 1.00 mg/dL 9.40  9.44  9.40   Sodium 135 - 145 mmol/L 140  140  135   Potassium 3.5 - 5.1 mmol/L 3.9  3.7  3.6   Chloride 98 - 111 mmol/L 106  106  101   CO2 22 - 32 mmol/L 25  24  23    Calcium  8.9 - 10.3 mg/dL 8.7  9.6  9.5        Latest Ref Rng & Units 03/01/2024    4:14 AM 02/29/2024    3:07 PM 02/22/2024    8:38 AM  CBC  WBC 4.0 - 10.5 K/uL 8.7   3.9   Hemoglobin 12.0 - 15.0 g/dL 87.4  86.9  87.1   Hematocrit 36.0 - 46.0 % 40.2  41.7  41.8   Platelets 150 - 400 K/uL 140   157     Principal Problem:   Gastric bypass status for obesity   Time coordinating discharge: 15 min  Signed:  Locus CHRISTELLA Tanda, MD [facility] [phone] 03/14/2024, 4:37 PM

## 2024-03-14 NOTE — Progress Notes (Signed)
 Subjective:  Patient ID: [name], female    DOB: [birth year]  Age: 53 y.o. MRN: [id number]  CC: Post-op Follow-up (Pt is here to f/u with PCP )   HPI [name] presents for L lat thigh numbness - post-op: Panel 1: LAPAROSCOPIC ROUX-EN-Y GASTRIC with Tanda Locus, MD  Panel 1: ENDOSCOPY, UPPER GI TRACT with Tanda Locus, MD  Panel 1: LYSIS, ADHESIONS, LAPAROSCOPIC with Tanda Locus, MD  Panel 1: REPAIR, HERNIA, HIATAL, LAPAROSCOPIC with Tanda Locus, MD  Panel 1: REPAIR, HERNIA, UMBILICAL, LAPAROSCOPIC with Tanda Locus, MD   C/o some HA C/o numbness in the L lat thigh post-op   Outpatient Medications Prior to Visit  Medication Sig Dispense Refill   Albuterol -Budesonide  (AIRSUPRA ) 90-80 MCG/ACT AERO Inhale 2 Inhalations into the lungs every 4 (four) hours as needed. 10 g 5   ALPRAZolam  (XANAX ) 1 MG tablet TAKE 1 TABLET (1 MG TOTAL) BY MOUTH 3 TIMES/DAY AS NEEDED-BETWEEN MEALS & BEDTIME FOR ANXIETY (Patient taking differently: Take 1 mg by mouth at bedtime.) 90 tablet 1   carvedilol  (COREG ) 6.25 MG tablet Take 1 tablet (6.25 mg total) by mouth 2 (two) times daily with a meal. 60 tablet 11   cholecalciferol  (VITAMIN D3) 25 MCG (1000 UNIT) tablet Take 1,000 Units by mouth daily.     cyclobenzaprine  (FLEXERIL ) 10 MG tablet TAKE 1 TABLET BY MOUTH EVERYDAY AT BEDTIME 90 tablet 3   enoxaparin  (LOVENOX ) 40 MG/0.4ML injection Inject 0.4 mLs (40 mg total) into the skin every 12 (twelve) hours. 28 mL 0   furosemide  (LASIX ) 40 MG tablet TAKE 1 TABLET BY MOUTH DAILY AS NEEDED 90 tablet 1   gabapentin  (NEURONTIN ) 100 MG capsule Take 1 capsule (100 mg total) by mouth every 12 (twelve) hours for 5 days. 10 capsule 0   hydrOXYzine  (VISTARIL ) 25 MG capsule TAKE 1-2 CAPSULES BY MOUTH AT BEDTIME AS NEEDED (Patient taking differently: Take 25 mg by mouth at bedtime.) 180 capsule 1   linaclotide  (LINZESS ) 290 MCG CAPS capsule Take 1 capsule (290 mcg total) by mouth daily before breakfast. 30  capsule 3   norethindrone-ethinyl estradiol (FYAVOLV) 1-5 MG-MCG TABS tablet Take 1 tablet by mouth daily.     Olmesartan -amLODIPine -HCTZ 40-5-12.5 MG TABS TAKE 1 TABLET BY MOUTH EVERY DAY 90 tablet 2   ondansetron  (ZOFRAN -ODT) 4 MG disintegrating tablet Dissolve 1 tablet (4 mg total) by mouth every 6 (six) hours as needed for nausea or vomiting. 20 tablet 0   oxyCODONE  (OXY IR/ROXICODONE ) 5 MG immediate release tablet Take 1 tablet (5 mg total) by mouth every 6 (six) hours as needed for breakthrough pain or severe pain (pain score 7-10). 5 tablet 0   pantoprazole  (PROTONIX ) 40 MG tablet Take 1 tablet (40 mg total) by mouth daily. 90 tablet 1   potassium chloride  SA (KLOR-CON  M) 20 MEQ tablet TAKE 1 TABLET BY MOUTH TWICE A DAY (Patient taking differently: Take 20 mEq by mouth daily.) 180 tablet 1   Prenatal Vit-Fe Fumarate-FA (M-NATAL PLUS) 27-1 MG TABS TAKE 1 TABLET BY MOUTH EVERY DAY 90 tablet 1   rizatriptan  (MAXALT ) 10 MG tablet TAKE 1 TABLET BY MOUTH ONCE AS NEEDED FOR UP TO 1 DOSE FOR MIGRAINE. MAY REPEAT IN 2 HOURS 12 tablet 5   thiamine (VITAMIN B-1) 100 MG tablet Take 100 mg by mouth daily.     No facility-administered medications prior to visit.    ROS: Review of Systems  Constitutional:  Negative for activity change, appetite change, chills, fatigue and unexpected  weight change.  HENT:  Negative for congestion, mouth sores and sinus pressure.   Eyes:  Negative for visual disturbance.  Respiratory:  Negative for cough and chest tightness.   Gastrointestinal:  Negative for abdominal pain and nausea.  Genitourinary:  Negative for difficulty urinating, frequency and vaginal pain.  Musculoskeletal:  Negative for back pain and gait problem.  Skin:  Negative for pallor and rash.  Neurological:  Negative for dizziness, tremors, weakness, numbness and headaches.  Psychiatric/Behavioral:  Negative for confusion, decreased concentration and sleep disturbance.     Objective:  BP 120/80    Pulse 72   Temp 98.5 F (36.9 C) (Oral)   Ht 5' 3 (1.6 m)   Wt 255 lb (115.7 kg)   LMP  (LMP Unknown)   SpO2 99%   BMI 45.17 kg/m   BP Readings from Last 3 Encounters:  03/14/24 120/80  03/01/24 138/76  02/22/24 128/66    Wt Readings from Last 3 Encounters:  03/15/24 254 lb 4.8 oz (115.3 kg)  03/14/24 255 lb (115.7 kg)  02/29/24 (P) 261 lb (118.4 kg)    Physical Exam Constitutional:      General: She is not in acute distress.    Appearance: She is well-developed. She is obese.  HENT:     Head: Normocephalic.     Right Ear: External ear normal.     Left Ear: External ear normal.     Nose: Nose normal.  Eyes:     General:        Right eye: No discharge.        Left eye: No discharge.     Conjunctiva/sclera: Conjunctivae normal.     Pupils: Pupils are equal, round, and reactive to light.  Neck:     Thyroid : No thyromegaly.     Vascular: No JVD.     Trachea: No tracheal deviation.  Cardiovascular:     Rate and Rhythm: Normal rate and regular rhythm.     Heart sounds: Normal heart sounds.  Pulmonary:     Effort: No respiratory distress.     Breath sounds: No stridor. No wheezing.  Abdominal:     General: Bowel sounds are normal. There is no distension.     Palpations: Abdomen is soft. There is no mass.     Tenderness: There is no abdominal tenderness. There is no guarding or rebound.  Musculoskeletal:        General: No tenderness.     Cervical back: Normal range of motion and neck supple. No rigidity.  Lymphadenopathy:     Cervical: No cervical adenopathy.  Skin:    Findings: No erythema or rash.  Neurological:     Cranial Nerves: No cranial nerve deficit.     Motor: No abnormal muscle tone.     Coordination: Coordination normal.     Deep Tendon Reflexes: Reflexes normal.  Psychiatric:        Behavior: Behavior normal.        Thought Content: Thought content normal.        Judgment: Judgment normal.     Lab Results  Component Value Date   WBC 5.5  03/14/2024   HGB 12.0 03/14/2024   HCT 37.3 03/14/2024   PLT 145.0 (L) 03/14/2024   GLUCOSE 76 03/14/2024   CHOL 170 05/13/2023   TRIG 85.0 05/13/2023   HDL 53.80 05/13/2023   LDLCALC 99 05/13/2023   ALT 41 (H) 03/14/2024   AST 26 03/14/2024   NA 141 03/14/2024  K 4.0 03/14/2024   CL 102 03/14/2024   CREATININE 0.57 03/14/2024   BUN 9 03/14/2024   CO2 29 03/14/2024   TSH 0.63 03/20/2023   INR 0.9 RATIO 03/15/2007   HGBA1C 6.2 05/13/2023    No results found.  Assessment & Plan:   Problem List Items Addressed This Visit     Abdominal bloating   Resolving post-op and w/wt loss      Relevant Orders   Comprehensive metabolic panel with GFR (Completed)   CBC with Differential/Platelet (Completed)   Abdominal distention - Primary   Resolving post-op and w/wt loss  Panel 1: LAPAROSCOPIC ROUX-EN-Y GASTRIC with Tanda Locus, MD  Panel 1: ENDOSCOPY, UPPER GI TRACT with Tanda Locus, MD  Panel 1: LYSIS, ADHESIONS, LAPAROSCOPIC with Tanda Locus, MD  Panel 1: REPAIR, HERNIA, HIATAL, LAPAROSCOPIC with Tanda Locus, MD  Panel 1: REPAIR, HERNIA, UMBILICAL, LAPAROSCOPIC with Tanda Locus, MD        Bilateral leg edema   Resolving post-op and w/wt loss      Insomnia disorder   Resolving post-op and w/wt loss      OBESITY, MORBID WITH STARTING BMI 44   Panel 1: LAPAROSCOPIC ROUX-EN-Y GASTRIC with Tanda Locus, MD  Panel 1: ENDOSCOPY, UPPER GI TRACT with Tanda Locus, MD  Panel 1: LYSIS, ADHESIONS, LAPAROSCOPIC with Tanda Locus, MD  Panel 1: REPAIR, HERNIA, HIATAL, LAPAROSCOPIC with Tanda Locus, MD  Panel 1: REPAIR, HERNIA, UMBILICAL, LAPAROSCOPIC with Tanda Locus, MD           No orders of the defined types were placed in this encounter.     Follow-up: Return in about 3 months (around 06/14/2024) for a follow-up visit.  Marolyn Noel, MD

## 2024-03-15 ENCOUNTER — Encounter: Attending: General Surgery | Admitting: Dietician

## 2024-03-15 ENCOUNTER — Encounter: Payer: Self-pay | Admitting: Dietician

## 2024-03-15 ENCOUNTER — Ambulatory Visit: Payer: Self-pay | Admitting: Internal Medicine

## 2024-03-15 VITALS — Ht 63.0 in | Wt 254.3 lb

## 2024-03-15 DIAGNOSIS — E1165 Type 2 diabetes mellitus with hyperglycemia: Secondary | ICD-10-CM | POA: Diagnosis present

## 2024-03-15 DIAGNOSIS — E669 Obesity, unspecified: Secondary | ICD-10-CM | POA: Insufficient documentation

## 2024-03-15 NOTE — Progress Notes (Signed)
 2 Week Post-Operative Nutrition Class   Patient was seen on 03/15/2024 for Post-Operative Nutrition education at the [facility].    Surgery date: 02/29/2024 Surgery type: RYGB  Anthropometrics  Start weight at [facility]: 260.7 lbs (date: 08/17/2023)  Height: 63 in Weight today: 254.3 lb   Clinical   Medical hx: obesity, HTN Medications: potassium chloride , olmesartan  amlodipine  rizatriptan , alprazolam , hydroxyzine , carvedilol , linzess , pantoprazole , inhaler, prenatal vitamin Labs: see EMR Notable signs/symptoms: none noted Any previous deficiencies? No Bowel Habits: Every day to every other day no complaints   Body Composition Scale 03/15/2024  Current Body Weight 254.3  Total Body Fat % 47.1  Visceral Fat 19  Fat-Free Mass % 52.8   Total Body Water  % 40.9  Muscle-Mass lbs 30.0  BMI 44.7  Body Fat Displacement          Torso  lbs 74.4         Left Leg  lbs 14.8         Right Leg  lbs 14.8         Left Arm  lbs 7.4         Right Arm  lbs 7.4    The following the learning objectives were met by the patient during this course: Identifies Soft Prepped Plan Advancement Guide  Identifies Soft, High Proteins (Phase 1), beginning 2 weeks post-operatively to 3 weeks post-operatively Identifies Additional Soft High Proteins, soft non-starchy vegetables, fruits and starches (Phase 2), beginning 3 weeks post-operatively to 3 months post-operatively Identifies appropriate sources of fluids, proteins, vegetables, fruits and starches Identifies appropriate fat sources and healthy verses unhealthy fat types   States protein, vegetable, fruit and starch recommendations and appropriate sources post-operatively Identifies the need for appropriate texture modifications, mastication, and bite sizes when consuming solids Identifies appropriate fat consumption and sources Identifies appropriate multivitamin and calcium  sources post-operatively Describes the need for  physical activity post-operatively and will follow MD recommendations States when to call healthcare provider regarding medication questions or post-operative complications   Handouts given during class include: Soft Prepped Plan Advancement Guide   Follow-Up Plan: Patient will follow-up at [facility] in 10 weeks for 3 month post-op nutrition visit for diet advancement per MD.

## 2024-03-21 ENCOUNTER — Telehealth: Payer: Self-pay | Admitting: Dietician

## 2024-03-21 DIAGNOSIS — E669 Obesity, unspecified: Secondary | ICD-10-CM

## 2024-03-21 NOTE — Telephone Encounter (Signed)
 Pt called with questions about advancing to phase II of the soft food progression. Patient's questions were answered to patient's satisfaction and expressed understanding.

## 2024-03-28 ENCOUNTER — Encounter: Payer: Self-pay | Admitting: Internal Medicine

## 2024-03-28 ENCOUNTER — Ambulatory Visit: Payer: Self-pay

## 2024-03-28 ENCOUNTER — Ambulatory Visit: Admitting: Internal Medicine

## 2024-03-28 VITALS — BP 126/82 | HR 72 | Temp 98.3°F | Ht 63.0 in | Wt 241.0 lb

## 2024-03-28 DIAGNOSIS — M79662 Pain in left lower leg: Secondary | ICD-10-CM | POA: Diagnosis not present

## 2024-03-28 DIAGNOSIS — E559 Vitamin D deficiency, unspecified: Secondary | ICD-10-CM | POA: Diagnosis not present

## 2024-03-28 DIAGNOSIS — M7989 Other specified soft tissue disorders: Secondary | ICD-10-CM

## 2024-03-28 DIAGNOSIS — I10 Essential (primary) hypertension: Secondary | ICD-10-CM

## 2024-03-28 DIAGNOSIS — R7309 Other abnormal glucose: Secondary | ICD-10-CM | POA: Diagnosis not present

## 2024-03-28 NOTE — Assessment & Plan Note (Signed)
 Resolving post-op and w/wt loss

## 2024-03-28 NOTE — Telephone Encounter (Signed)
 FYI Only or Action Required?: FYI only for provider.  Patient was last seen in primary care on 03/14/2024 by Plotnikov, Karlynn GAILS, MD.  Called Nurse Triage reporting Joint Swelling.  Symptoms began left thigh numbness x 1 month, left knee and thigh swelling and pain x 2 days.  Interventions attempted: OTC medications: Tylenol , Ice/heat application, and Other: elevate extremity.  Symptoms are: left thigh numbness, left thigh and knee swelling with pain gradually worsening.  Triage Disposition: See HCP Within 4 Hours (Or PCP Triage)  Patient/caregiver understands and will follow disposition?: Yes            Copied from CRM #[id number]. Topic: Clinical - Red Word Triage >> Mar 28, 2024 10:19 AM Henretta I wrote: Red Word that prompted transfer to Nurse Triage: Swelling in thigh, that makes it hard to walk Reason for Disposition  [1] Thigh or calf swelling AND [2] only 1 side  Answer Assessment - Initial Assessment Questions 1. LOCATION: Where is the swelling located?  (e.g., left, right, both knees)     Left knee and up into her thigh.  2. ONSET: When did the swelling start? Does it come and go, or is it there all the time?     Saturday.  3. SWELLING: How bad is the swelling? Or, How large is it? (e.g., mild, moderate, severe; size of localized swelling)      She states it seems like the whole area is swollen but she feels a couple of knots (about the size of the tip of her pinky).  4. PAIN: Is there any pain? If Yes, ask: How bad is it? (Scale 0-10; or none, mild, moderate, severe)     Yes, pressure and pain worse when standing up. 5/10.  5. SETTING: Has there been any recent work, exercise or other activity that involved that part of the body?      She states she has been walking 30 minutes daily. She states she has been walking on the treadmill but on Saturday she walked outside.  6. AGGRAVATING FACTORS: What makes the knee swelling worse? (e.g., walking,  climbing stairs, running)     No.  7. ASSOCIATED SYMPTOMS: Is there any pain or redness?     Yes pain, no redness.  8. OTHER SYMPTOMS: Do you have any other symptoms? (e.g., calf pain, chest pain, difficulty breathing, fever)     Numbness (constant) in left thigh since having her surgery (she states she spoke with Dr Garald and has been taking the Tylenol , ice/heat, elevating the leg). Denies chest pain, SOB, fever.  9. PREGNANCY: Is there any chance you are pregnant? When was your last menstrual period?     N/A.  Protocols used: Knee Swelling-A-AH

## 2024-03-28 NOTE — Assessment & Plan Note (Signed)
 BP Readings from Last 3 Encounters:  03/28/24 126/82  03/14/24 120/80  03/01/24 138/76   Stable, pt to continue medical treatment coreg  6.25 bid, tribenzor 40 5 12.5 qd

## 2024-03-28 NOTE — Assessment & Plan Note (Signed)
Last vitamin D Lab Results  Component Value Date   VD25OH 57.0 09/22/2022   Stable, cont oral replacement

## 2024-03-28 NOTE — Patient Instructions (Signed)
 Ok to start the lovenox  40 mg twice per day since you have this at home  Please continue all other medications as before, and refills have been done if requested.  Please have the pharmacy call with any other refills you may need  Please keep your appointments with your specialists as you may have planned  You will be contacted regarding the referral for: Left leg venous doppler (asap)  We can hold on lab testing for now

## 2024-03-28 NOTE — Progress Notes (Signed)
 Patient ID: [name], female   DOB: Nov 12, 1970, 53 y.o.   MRN: [id number]        Chief Complaint: follow up left leg pain and swelling, recent gastric bypass surgury, htn, hyperglycemia, low vit d       HPI:  [name] is a 53 y.o. female here with hx of recent gastric bypass surgury July 28 with lovenox  40 mg bid x 2 wks after, now with 3 days onset left whole leg swelling, but pain and tightness mostly to the upper leg and posterior knee, worse to walk, better to sit.  No fever, trauma.  Still has 2 wks lovenox  at home.  Pt denies chest pain, increased sob or doe, wheezing, orthopnea, PND, increased LE swelling, palpitations, dizziness or syncope.   Pt denies polydipsia, polyuria, or new focal neuro s/s except also has known left anterior upper leg numbness since surgury not yet improved.        Wt Readings from Last 3 Encounters:  03/28/24 241 lb (109.3 kg)  03/15/24 254 lb 4.8 oz (115.3 kg)  03/14/24 255 lb (115.7 kg)   BP Readings from Last 3 Encounters:  03/28/24 126/82  03/14/24 120/80  03/01/24 138/76         Past Medical History:  Diagnosis Date   Allergy    rhinitis   Anemia, unspecified    Anxiety    Arthritis    Asthma    asthma   Back pain    Colitis    Dyspnea    Fibromyalgia    Hypertension    Joint pain    Lactose intolerance    Leiomyoma of uterus, unspecified    Meralgia paresthetica    Nausea alone    Obesity    Other B-complex deficiencies    Pre-diabetes    Stomach ulcer    Umbilical hernia    Unspecified vitamin D  deficiency    Past Surgical History:  Procedure Laterality Date   BREAST REDUCTION SURGERY     COLONOSCOPY     ESOPHAGOGASTRODUODENOSCOPY ENDOSCOPY     fibriodidectomy  2009   GASTRIC ROUX-EN-Y N/A 02/29/2024   Procedure: LAPAROSCOPIC ROUX-EN-Y GASTRIC;  Surgeon: Tanda Locus, MD;  Location: [facility];  Service: General;  Laterality: N/A;   HIATAL HERNIA REPAIR  02/29/2024   Procedure: REPAIR, HERNIA, HIATAL, LAPAROSCOPIC;   Surgeon: Tanda Locus, MD;  Location: [facility];  Service: General;;   INCISIONAL HERNIA REPAIR  02/29/2024   Procedure: REPAIR, HERNIA, INCISIONAL, LAPAROSCOPIC;  Surgeon: Tanda Locus, MD;  Location: [facility];  Service: General;;   LAPAROSCOPIC LYSIS OF ADHESIONS  02/29/2024   Procedure: LYSIS, ADHESIONS, LAPAROSCOPIC;  Surgeon: Tanda Locus, MD;  Location: [facility];  Service: General;;   MYOMECTOMY  2004   x 2   UPPER GI ENDOSCOPY N/A 02/29/2024   Procedure: ENDOSCOPY, UPPER GI TRACT;  Surgeon: Tanda Locus, MD;  Location: [facility];  Service: General;  Laterality: N/A;    reports that she has never smoked. She has never been exposed to tobacco smoke. She has never used smokeless tobacco. She reports current alcohol use. She reports that she does not use drugs. family history includes Alcoholism in her father; Diabetes in her paternal grandmother; High blood pressure in her father; Hypertension in her father and another family member. No Known Allergies Current Outpatient Medications on File Prior to Visit  Medication Sig Dispense Refill   Albuterol -Budesonide  (AIRSUPRA ) 90-80 MCG/ACT AERO Inhale 2 Inhalations into the lungs every 4 (four) hours as needed.  10 g 5   ALPRAZolam  (XANAX ) 1 MG tablet TAKE 1 TABLET (1 MG TOTAL) BY MOUTH 3 TIMES/DAY AS NEEDED-BETWEEN MEALS & BEDTIME FOR ANXIETY (Patient taking differently: Take 1 mg by mouth at bedtime.) 90 tablet 1   carvedilol  (COREG ) 6.25 MG tablet Take 1 tablet (6.25 mg total) by mouth 2 (two) times daily with a meal. 60 tablet 11   cholecalciferol  (VITAMIN D3) 25 MCG (1000 UNIT) tablet Take 1,000 Units by mouth daily.     cyclobenzaprine  (FLEXERIL ) 10 MG tablet TAKE 1 TABLET BY MOUTH EVERYDAY AT BEDTIME 90 tablet 3   enoxaparin  (LOVENOX ) 40 MG/0.4ML injection Inject 0.4 mLs (40 mg total) into the skin every 12 (twelve) hours. 28 mL 0   furosemide  (LASIX ) 40 MG tablet TAKE 1 TABLET BY MOUTH DAILY AS NEEDED 90 tablet 1   hydrOXYzine  (VISTARIL ) 25 MG capsule  TAKE 1-2 CAPSULES BY MOUTH AT BEDTIME AS NEEDED (Patient taking differently: Take 25 mg by mouth at bedtime.) 180 capsule 1   linaclotide  (LINZESS ) 290 MCG CAPS capsule Take 1 capsule (290 mcg total) by mouth daily before breakfast. 30 capsule 3   norethindrone-ethinyl estradiol (FYAVOLV) 1-5 MG-MCG TABS tablet Take 1 tablet by mouth daily.     Olmesartan -amLODIPine -HCTZ 40-5-12.5 MG TABS TAKE 1 TABLET BY MOUTH EVERY DAY 90 tablet 2   ondansetron  (ZOFRAN -ODT) 4 MG disintegrating tablet Dissolve 1 tablet (4 mg total) by mouth every 6 (six) hours as needed for nausea or vomiting. 20 tablet 0   oxyCODONE  (OXY IR/ROXICODONE ) 5 MG immediate release tablet Take 1 tablet (5 mg total) by mouth every 6 (six) hours as needed for breakthrough pain or severe pain (pain score 7-10). 5 tablet 0   pantoprazole  (PROTONIX ) 40 MG tablet Take 1 tablet (40 mg total) by mouth daily. 90 tablet 1   potassium chloride  SA (KLOR-CON  M) 20 MEQ tablet TAKE 1 TABLET BY MOUTH TWICE A DAY (Patient taking differently: Take 20 mEq by mouth daily.) 180 tablet 1   Prenatal Vit-Fe Fumarate-FA (M-NATAL PLUS) 27-1 MG TABS TAKE 1 TABLET BY MOUTH EVERY DAY 90 tablet 1   rizatriptan  (MAXALT ) 10 MG tablet TAKE 1 TABLET BY MOUTH ONCE AS NEEDED FOR UP TO 1 DOSE FOR MIGRAINE. MAY REPEAT IN 2 HOURS 12 tablet 5   thiamine (VITAMIN B-1) 100 MG tablet Take 100 mg by mouth daily.     gabapentin  (NEURONTIN ) 100 MG capsule Take 1 capsule (100 mg total) by mouth every 12 (twelve) hours for 5 days. (Patient not taking: Reported on 03/28/2024) 10 capsule 0   No current facility-administered medications on file prior to visit.        ROS:  All others reviewed and negative.  Objective        PE:  BP 126/82   Pulse 72   Temp 98.3 F (36.8 C)   Ht 5' 3 (1.6 m)   Wt 241 lb (109.3 kg)   SpO2 99%   BMI 42.69 kg/m                 Constitutional: Pt appears in NAD               HENT: Head: NCAT.                Right Ear: External ear normal.                  Left Ear: External ear normal.  Eyes: . Pupils are equal, round, and reactive to light. Conjunctivae and EOM are normal               Nose: without d/c or deformity               Neck: Neck supple. Gross normal ROM               Cardiovascular: Normal rate and regular rhythm.                 Pulmonary/Chest: Effort normal and breath sounds without rales or wheezing.                Abd:  Soft, NT, ND, + BS, no organomegaly               Neurological: Pt is alert. At baseline orientation, motor grossly intact               Skin: Skin is warm. No rashes, LE edema - whole left leg and foot swelling noted from groin to toes,. Also tender post right knee and tightness to diffuse whole upper leg               Psychiatric: Pt behavior is normal without agitation   Micro: none  Cardiac tracings I have personally interpreted today:  none  Pertinent Radiological findings (summarize): none   Lab Results  Component Value Date   WBC 5.5 03/14/2024   HGB 12.0 03/14/2024   HCT 37.3 03/14/2024   PLT 145.0 (L) 03/14/2024   GLUCOSE 76 03/14/2024   CHOL 170 05/13/2023   TRIG 85.0 05/13/2023   HDL 53.80 05/13/2023   LDLCALC 99 05/13/2023   ALT 41 (H) 03/14/2024   AST 26 03/14/2024   NA 141 03/14/2024   K 4.0 03/14/2024   CL 102 03/14/2024   CREATININE 0.57 03/14/2024   BUN 9 03/14/2024   CO2 29 03/14/2024   TSH 0.63 03/20/2023   INR 0.9 RATIO 03/15/2007   HGBA1C 6.2 05/13/2023   Assessment/Plan:  Aidah Forquer is a 53 y.o. Black or African American [2] female with  has a past medical history of Allergy, Anemia, unspecified, Anxiety, Arthritis, Asthma, Back pain, Colitis, Dyspnea, Fibromyalgia, Hypertension, Joint pain, Lactose intolerance, Leiomyoma of uterus, unspecified, Meralgia paresthetica, Nausea alone, Obesity, Other B-complex deficiencies, Pre-diabetes, Stomach ulcer, Umbilical hernia, and Unspecified vitamin D  deficiency.  Pain and swelling of left lower  leg With high suspicion for acute dvt - for urgent venous doppler, also if cannot be done today, she should restart the lovenox  40 mg bid pending results  HYPERGLYCEMIA Lab Results  Component Value Date   HGBA1C 6.2 05/13/2023   Stable, pt to continue current medical treatment  - diet,wt control   Essential hypertension BP Readings from Last 3 Encounters:  03/28/24 126/82  03/14/24 120/80  03/01/24 138/76   Stable, pt to continue medical treatment coreg  6.25 bid, tribenzor 40 5 12.5 qd   Vitamin D  deficiency Last vitamin D  Lab Results  Component Value Date   VD25OH 57.0 09/22/2022   Stable, cont oral replacement  Followup: Return if symptoms worsen or fail to improve.  Lynwood Rush, MD 03/28/2024 6:45 PM [facility]  Primary Care - [facility]

## 2024-03-28 NOTE — Assessment & Plan Note (Signed)
 Lab Results  Component Value Date   HGBA1C 6.2 05/13/2023   Stable, pt to continue current medical treatment  - diet,wt control

## 2024-03-28 NOTE — Assessment & Plan Note (Signed)
 With high suspicion for acute dvt - for urgent venous doppler, also if cannot be done today, she should restart the lovenox  40 mg bid pending results

## 2024-03-29 NOTE — Telephone Encounter (Signed)
 Pt was seen by Dr. Norleen and issue was addressed at this apptmnt.

## 2024-03-29 NOTE — Telephone Encounter (Signed)
 Copied from CRM [id number]. Topic: Clinical - Medical Advice >> Mar 29, 2024  8:15 AM Anairis L wrote: Reason for CRM: Patient is calling to info Dr. Garald that she is to return to work on 04/05/2024 but she still still having some swelling in her left thigh. Pt is requesting a call back from Dr nurse. Thank you

## 2024-03-30 ENCOUNTER — Ambulatory Visit (HOSPITAL_COMMUNITY)
Admission: RE | Admit: 2024-03-30 | Discharge: 2024-03-30 | Disposition: A | Discharge status: Home / Self Care | Source: Ambulatory Visit | Attending: Internal Medicine | Admitting: Internal Medicine

## 2024-03-30 ENCOUNTER — Ambulatory Visit: Payer: Self-pay | Admitting: Internal Medicine

## 2024-03-30 ENCOUNTER — Other Ambulatory Visit: Payer: Self-pay | Admitting: Internal Medicine

## 2024-03-30 ENCOUNTER — Telehealth: Payer: Self-pay | Admitting: *Deleted

## 2024-03-30 DIAGNOSIS — M7989 Other specified soft tissue disorders: Secondary | ICD-10-CM

## 2024-03-30 DIAGNOSIS — M79662 Pain in left lower leg: Secondary | ICD-10-CM | POA: Insufficient documentation

## 2024-03-30 NOTE — Telephone Encounter (Signed)
 Received on ( 03/25/2024) via of fax DME Standard Written Order from [facility].  Requesting signature and return.  Given to provider to sign.    DME Standard Written Order signed and faxed back to [facility].  Confirmation received and copy scanned into the chart.//AR/CMA

## 2024-03-31 ENCOUNTER — Ambulatory Visit: Admitting: Family Medicine

## 2024-03-31 ENCOUNTER — Ambulatory Visit (INDEPENDENT_AMBULATORY_CARE_PROVIDER_SITE_OTHER)

## 2024-03-31 VITALS — BP 134/86 | HR 79 | Ht 63.0 in | Wt 244.0 lb

## 2024-03-31 DIAGNOSIS — M25552 Pain in left hip: Secondary | ICD-10-CM

## 2024-03-31 DIAGNOSIS — G8929 Other chronic pain: Secondary | ICD-10-CM | POA: Diagnosis not present

## 2024-03-31 MED ORDER — GABAPENTIN 300 MG PO CAPS: 300.0000 mg | ORAL_CAPSULE | Freq: Three times a day (TID) | ORAL | 1 refills | Status: DC | PRN

## 2024-03-31 NOTE — Progress Notes (Unsigned)
 Vanessa Ileana Collet, PhD, LAT, ATC acting as a scribe for Artist Lloyd, MD.  [name] is a 53 y.o. female who presents to [facility] at [facility] today for L leg pain ongoing since Saturday. She been increasing her activity, walking. Pt locates pain to anterior-lateral aspect of her L thigh. She notes shooting pain going through her thigh.   Since her gastic bypass surgery, she continues to have numbness along the L lateral thigh.   Low back pain: no currently, yes, prior to july Radiating pain: no LE numbness/tingling: unable to determine LE weakness: no Aggravates: increased activity Treatments tried: massage, Voltaren gel, ice, heat, gabapentin   Dx testing: 03/30/24 L LE vasc US  05/13/20 L-spine MRI 03/05/20 L-spine XR  Pertinent review of systems: No fevers or chills  Relevant historical information: Had gastric bypass surgery about a month ago.   Exam:  BP 134/86   Pulse 79   Ht 5' 3 (1.6 m)   Wt 244 lb (110.7 kg)   LMP  (LMP Unknown)   SpO2 94%   BMI 43.22 kg/m  General: Well Developed, well nourished, and in no acute distress.   MSK: Left hip normal-appearing Normal motion. Mildly tender palpation anterior hip. Some pain is present with resisted hip flexion.  Strength is reduced rated 4/5. Abduction strength is intact. Sensation is reduced to light touch lateral thigh.    Lab and Radiology Results No results found for this or any previous visit (from the past 72 hours). VAS US  LOWER EXTREMITY VENOUS (DVT) Result Date: 03/30/2024  Lower Venous DVT Study Patient Name:  [name]  Date of Exam:   03/30/2024 Medical Rec #: [id number]         Accession #:    [id number] Date of Birth: May 17, 1971          Patient Gender: F Patient Age:   18 years Exam Location:  [address] Procedure:      VAS US  LOWER EXTREMITY VENOUS (DVT) Referring Phys: LYNWOOD RUSH --------------------------------------------------------------------------------   Indications: Swelling, and Pain.  Performing Technologist: Duwaine Hives RVS  Examination Guidelines: A complete evaluation includes B-mode imaging, spectral Doppler, color Doppler, and power Doppler as needed of all accessible portions of each vessel. Bilateral testing is considered an integral part of a complete examination. Limited examinations for reoccurring indications may be performed as noted. The reflux portion of the exam is performed with the patient in reverse Trendelenburg.  +-----+---------------+---------+-----------+----------+--------------+ RIGHTCompressibilityPhasicitySpontaneityPropertiesThrombus Aging +-----+---------------+---------+-----------+----------+--------------+ CFV  Full           Yes      Yes                                 +-----+---------------+---------+-----------+----------+--------------+   +---------+---------------+---------+-----------+----------+--------------+ LEFT     CompressibilityPhasicitySpontaneityPropertiesThrombus Aging +---------+---------------+---------+-----------+----------+--------------+ CFV      Full           Yes      Yes                                 +---------+---------------+---------+-----------+----------+--------------+ SFJ      Full                                                        +---------+---------------+---------+-----------+----------+--------------+  FV Prox  Full                                                        +---------+---------------+---------+-----------+----------+--------------+ FV Mid   Full                                                        +---------+---------------+---------+-----------+----------+--------------+ FV DistalFull                                                        +---------+---------------+---------+-----------+----------+--------------+ PFV      Full                                                         +---------+---------------+---------+-----------+----------+--------------+ POP      Full           Yes      Yes                                 +---------+---------------+---------+-----------+----------+--------------+ PTV      Full                                                        +---------+---------------+---------+-----------+----------+--------------+ PERO     Full                                                        +---------+---------------+---------+-----------+----------+--------------+     Summary: RIGHT: - No evidence of common femoral vein obstruction.   LEFT: - There is no evidence of deep vein thrombosis in the lower extremity.  - No cystic structure found in the popliteal fossa.  *See table(s) above for measurements and observations. Electronically signed by Penne Colorado MD on 03/30/2024 at 11:58:34 AM.    Final     X-ray images left hip obtained today personally and independently interpreted. No acute fractures.  Minimal degenerative changes left hip. Await formal radiology review   Assessment and Plan: 53 y.o. female with left anterior hip pain and lateral thigh with numbness occurring about a month after bariatric surgery.  Lateral thigh numbness due to meralgia paresthetica.  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  This should improve with time without any specific treatment.  I have prescribed gabapentin  to use primarily at bedtime as needed.  Additionally she does have some anterior hip pain and pain with resisted hip flexion.  The hip flexor tendons do cross in this area and if she had no pressure or on the anterior hip to irritate the lateral femoral cutaneous nerve it is quite possible that she had some irritation of the hip flexor tendons.  Plan to refer to physical therapy.   Recheck back in about 6 weeks.  PDMP not reviewed this encounter. Orders Placed This Encounter  Procedures   DG  HIP UNILAT W OR W/O PELVIS 2-3 VIEWS LEFT    Standing Status:   Future    Number of Occurrences:   1    Expiration Date:   05/01/2024    Reason for Exam (SYMPTOM  OR DIAGNOSIS REQUIRED):   left hip pain    Preferred imaging location?:   [facility]    Is patient pregnant?:   No   Ambulatory referral to Physical Therapy    Referral Priority:   Routine    Referral Type:   Physical Medicine    Referral Reason:   Specialty Services Required    Requested Specialty:   Physical Therapy    Number of Visits Requested:   1   Meds ordered this encounter  Medications   gabapentin  (NEURONTIN ) 300 MG capsule    Sig: Take 1 capsule (300 mg total) by mouth 3 (three) times daily as needed.    Dispense:  90 capsule    Refill:  1     Discussed warning signs or symptoms. Please see discharge instructions. Patient expresses understanding.   The above documentation has been reviewed and is accurate and complete Artist Lloyd, M.D.

## 2024-03-31 NOTE — Patient Instructions (Addendum)
 Thank you for coming in today.   I've referred you to Physical Therapy here, at this office.  You will hear from our office soon about scheduling, once we check with your insurance company.   Please get an Xray today before you leave   I've sent a prescription for Gabapentin  to your pharmacy.   Check back in 6 weeks

## 2024-04-06 ENCOUNTER — Ambulatory Visit: Payer: Self-pay | Admitting: Family Medicine

## 2024-04-06 NOTE — Progress Notes (Signed)
 Left hip x-ray looks okay.  No significant abnormalities visible on the left hip.

## 2024-04-07 ENCOUNTER — Telehealth: Payer: Self-pay | Admitting: Family Medicine

## 2024-04-07 NOTE — Telephone Encounter (Signed)
 Pt seen 8/28, still experiencing leg swelling.  Starts PT 9/10, returning to work 9/9  Using ice/heat but still swelling, should she have a brace or do we have any other recommendations?

## 2024-04-07 NOTE — Telephone Encounter (Signed)
 I am happy to do an injection in the thigh if you would like.  This would help treat that nerve.  Your symptoms bad enough that you need this?  If so schedule an appointment.

## 2024-04-07 NOTE — Telephone Encounter (Signed)
 Forwarding to Dr. Joane to review and advise.   Per visit note 03/31/24:  Assessment and Plan: 53 y.o. female with left anterior hip pain and lateral thigh with numbness occurring about a month after bariatric surgery.   Lateral thigh numbness due to meralgia paresthetica.  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  This should improve with time without any specific treatment.  I have prescribed gabapentin  to use primarily at bedtime as needed.   Additionally she does have some anterior hip pain and pain with resisted hip flexion.  The hip flexor tendons do cross in this area and if she had no pressure or on the anterior hip to irritate the lateral femoral cutaneous nerve it is quite possible that she had some irritation of the hip flexor tendons.  Plan to refer to physical therapy.     Recheck back in about 6 weeks.

## 2024-04-07 NOTE — Telephone Encounter (Signed)
 Patient called back, she was not interested in an injection, yes more concerned about the swelling. Patient was wondering if she should use compression or a wrap or something along those lines would like a call back today

## 2024-04-07 NOTE — Telephone Encounter (Signed)
 Compression would be reasonable.  Ace wrap is a good idea.

## 2024-04-07 NOTE — Telephone Encounter (Signed)
 I think the concern was more about the swelling.   Forwarding back to Dr. Joane.

## 2024-04-12 NOTE — Therapy (Signed)
 OUTPATIENT PHYSICAL THERAPY EVALUATION   Patient Name: [name] MRN: [id number] DOB:09/09/1970, 53 y.o., female Today's Date: 04/12/2024   END OF SESSION:   Past Medical History:  Diagnosis Date   Allergy    rhinitis   Anemia, unspecified    Anxiety    Arthritis    Asthma    asthma   Back pain    Colitis    Dyspnea    Fibromyalgia    Hypertension    Joint pain    Lactose intolerance    Leiomyoma of uterus, unspecified    Meralgia paresthetica    Nausea alone    Obesity    Other B-complex deficiencies    Pre-diabetes    Stomach ulcer    Umbilical hernia    Unspecified vitamin D  deficiency    Past Surgical History:  Procedure Laterality Date   BREAST REDUCTION SURGERY     COLONOSCOPY     ESOPHAGOGASTRODUODENOSCOPY ENDOSCOPY     fibriodidectomy  2009   GASTRIC ROUX-EN-Y N/A 02/29/2024   Procedure: LAPAROSCOPIC ROUX-EN-Y GASTRIC;  Surgeon: Tanda Locus, MD;  Location: [facility];  Service: General;  Laterality: N/A;   HIATAL HERNIA REPAIR  02/29/2024   Procedure: REPAIR, HERNIA, HIATAL, LAPAROSCOPIC;  Surgeon: Tanda Locus, MD;  Location: [facility];  Service: General;;   INCISIONAL HERNIA REPAIR  02/29/2024   Procedure: REPAIR, HERNIA, INCISIONAL, LAPAROSCOPIC;  Surgeon: Tanda Locus, MD;  Location: [facility];  Service: General;;   LAPAROSCOPIC LYSIS OF ADHESIONS  02/29/2024   Procedure: LYSIS, ADHESIONS, LAPAROSCOPIC;  Surgeon: Tanda Locus, MD;  Location: [facility];  Service: General;;   MYOMECTOMY  2004   x 2   UPPER GI ENDOSCOPY N/A 02/29/2024   Procedure: ENDOSCOPY, UPPER GI TRACT;  Surgeon: Tanda Locus, MD;  Location: [facility];  Service: General;  Laterality: N/A;   Patient Active Problem List   Diagnosis Date Noted   Pain and swelling of left lower leg 03/28/2024   Gastric bypass status for obesity 02/29/2024   Preop exam for internal medicine 02/18/2024   Diastolic dysfunction 06/23/2023   OSA (obstructive sleep apnea) 05/29/2023   Laryngopharyngeal reflux (LPR)  05/19/2023   Dyslipidemia, goal LDL below 100 05/13/2023   Bilateral leg edema 05/13/2023   Type 2 diabetes mellitus with hyperglycemia (HCC) 02/24/2023   Goiter, nodular 02/19/2023   Hyperglycemia 02/19/2023   Weight gain 02/19/2023   Acute dysfunction of left eustachian tube 02/09/2023   Stress at work 11/24/2022   Emotional stress 09/22/2022   Poor sleep 07/24/2022   Irregular periods 07/23/2022   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   Gastroesophageal reflux disease 06/04/2022   Breast wound 05/23/2022   Shoulder bursitis 04/23/2022   DOE (dyspnea on exertion) 04/01/2022   Gastritis 04/01/2022   Other constipation 04/01/2022   Abdominal bloating 04/01/2022   Anxiety disorder 01/16/2022   Aphthous ulcer of mouth 01/02/2022   Palpitations 12/20/2021   Dizziness 12/20/2021   Constipation 12/20/2021   Abdominal distention 12/13/2021   COVID-19 03/06/2021   Symptomatic mammary hypertrophy 02/26/2021   Achilles tendinitis 02/20/2021   Fibromyalgia 12/25/2020   Prediabetes 10/05/2020   Large breasts 04/25/2020   Lumbar radiculopathy 04/25/2020   Sciatica 03/05/2020   Boil 01/23/2020   Situational depression 02/21/2019   Conjunctivitis 01/03/2019   Carpal tunnel syndrome, bilateral 11/23/2018   Migraine headache without aura 06/03/2018   Shortness of breath on exertion 10/07/2017   Abnormal TSH 09/30/2017   Periscapular pain 09/04/2017   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   Hydradenitis  03/25/2017   Abdominal pain 10/06/2016   Need for tuberculosis vaccination 07/02/2016   Heavy periods 04/21/2016   Neck pain 01/02/2016   Acute upper respiratory infection 09/11/2015   Superficial phlebitis 08/11/2014   Reaction, adjustment, with depressed mood, brief 03/17/2014   Sinusitis, acute 08/24/2013   Need for prophylactic vaccination and inoculation against other combinations of diseases 12/16/2012   Boil, face 11/22/2012   RLQ abdominal pain 10/21/2012   Fever 10/21/2012    Hypokalemia 10/21/2012   Colitis, acute 10/21/2012   Right ovarian cyst 10/21/2012   Adrenal nodule (HCC) 10/21/2012   Noninfective gastroenteritis and colitis, unspecified 10/21/2012   Fatigue 07/08/2012   Well adult exam 03/31/2012   TACHYCARDIA 10/01/2010   HYPERGLYCEMIA 03/04/2010   Pain in joint 01/18/2010   Low back pain 01/18/2010   MERALGIA PARESTHETICA 12/18/2009   Alopecia 10/05/2009   CYSTITIS, ACUTE 06/15/2009   CONTACT DERMATITIS&OTH ECZEMA DUE OTH SPEC AGENT 04/06/2009   Genital herpes simplex 08/04/2008   OBESITY, MORBID WITH STARTING BMI 44 06/23/2008   Asthmatic bronchitis 06/05/2008   PYOGENIC GRANULOMA 04/17/2008   LEG PAIN 01/14/2008   PARESTHESIA 01/14/2008   Essential hypertension 12/17/2007   Allergic rhinitis 12/17/2007   Dyspnea 07/12/2007   Vitamin D  deficiency 06/30/2007   Nausea 06/30/2007   B12 deficiency 06/11/2007   Unspecified Iron  Deficiency Anemia 06/11/2007   FIBROIDS, UTERUS 03/13/2007   Anemia 03/13/2007    PCP: Plotnikov, Karlynn GAILS, MD  REFERRING PROVIDER: Joane Artist RAMAN, MD  REFERRING DIAG: Chronic hip pain, left  THERAPY DIAG:  No diagnosis found.  Rationale for Evaluation and Treatment: Rehabilitation  ONSET DATE: ***   SUBJECTIVE:  SUBJECTIVE STATEMENT: ***  PERTINENT HISTORY: See PMH above  PAIN:  Are you having pain? Yes:  NPRS scale: *** Pain location: *** Pain description: *** Aggravating factors: *** Relieving factors: ***  PRECAUTIONS: {Therapy precautions:24002}  RED FLAGS: None   WEIGHT BEARING RESTRICTIONS: No  FALLS:  Has patient fallen in last 6 months? {fallsyesno:27318}  LIVING ENVIRONMENT: Lives with: {[facility] lives with:25569::lives with their family} Lives in: {Lives in:25570} Stairs: {opstairs:27293} Has following equipment at home: {Assistive devices:23999}  OCCUPATION: ***  PLOF: Independent  PATIENT GOALS: ***   OBJECTIVE:  Note: Objective measures were completed at  Evaluation unless otherwise noted. PATIENT SURVEYS:  {rehab surveys:24030}  COGNITION: Overall cognitive status: Within functional limits for tasks assessed     SENSATION: {sensation:27233}  EDEMA:  {edema:24020}  MUSCLE LENGTH: ***  POSTURE:   ***  PALPATION: ***  LOWER EXTREMITY ROM:  {AROM/PROM:27142} ROM Right eval Left eval  Hip flexion    Hip extension    Hip abduction    Hip adduction    Hip internal rotation    Hip external rotation    Knee flexion    Knee extension    Ankle dorsiflexion    Ankle plantarflexion    Ankle inversion    Ankle eversion     (Blank rows = not tested)  LOWER EXTREMITY MMT:  MMT Right eval Left eval  Hip flexion    Hip extension    Hip abduction    Hip adduction    Hip internal rotation    Hip external rotation    Knee flexion    Knee extension    Ankle dorsiflexion    Ankle plantarflexion    Ankle inversion    Ankle eversion     (Blank rows = not tested)  FUNCTIONAL TESTS:  {Functional tests:24029}  GAIT: Assistive device utilized: {  Assistive devices:23999} Level of assistance: {Levels of assistance:24026} Comments: ***                                                                                                                               TREATMENT  [facility] Adult PT Treatment:                                                DATE: 04/13/2024 ***  PATIENT EDUCATION:  Education details: Exam findings, POC, HEP Person educated: Patient Education method: Explanation, Demonstration, Tactile cues, Verbal cues, and Handouts Education comprehension: verbalized understanding, returned demonstration, verbal cues required, tactile cues required, and needs further education  HOME EXERCISE PROGRAM: ***   ASSESSMENT: CLINICAL IMPRESSION: Patient is a 53 y.o. female who was seen today for physical therapy evaluation and treatment for ***.   OBJECTIVE IMPAIRMENTS: {opptimpairments:25111}.   ACTIVITY LIMITATIONS:  {activitylimitations:27494}  PARTICIPATION LIMITATIONS: {participationrestrictions:25113}  PERSONAL FACTORS: {Personal factors:25162} are also affecting patient's functional outcome.   REHAB POTENTIAL: Good  CLINICAL DECISION MAKING: Stable/uncomplicated  EVALUATION COMPLEXITY: Low   GOALS: Goals reviewed with patient? Yes  SHORT TERM GOALS: Target date: ***  Patient will be I with initial HEP in order to progress with therapy. Baseline: HEP provided at eval Goal status: INITIAL  2.  *** Baseline:  Goal status: INITIAL  3.  *** Baseline:  Goal status: INITIAL  LONG TERM GOALS: Target date: ***  Patient will be I with final HEP to maintain progress from PT. Baseline: HEP provided at eval Goal status: INITIAL  2.  *** Baseline:  Goal status: INITIAL  3.  *** Baseline:  Goal status: INITIAL  4.  *** Baseline:  Goal status: INITIAL   PLAN: PT FREQUENCY: 1x/week  PT DURATION: 8 weeks  PLANNED INTERVENTIONS: 97164- PT Re-evaluation, 97750- Physical Performance Testing, 97110-Therapeutic exercises, 97530- Therapeutic activity, 97112- Neuromuscular re-education, 97535- Self Care, 02859- Manual therapy, 97116- Gait training, [phone] (1-2 muscles), 20561 (3+ muscles)- Dry Needling, Patient/Family education, Balance training, Stair training, Taping, Joint mobilization, Joint manipulation, Spinal manipulation, Spinal mobilization, Cryotherapy, and Moist heat  PLAN FOR NEXT SESSION: Review HEP and progress PRN, ***   Elaine Daring, PT, DPT, LAT, ATC 04/12/24  10:43 AM Phone: [phone] Fax: [phone]

## 2024-04-13 ENCOUNTER — Other Ambulatory Visit: Payer: Self-pay

## 2024-04-13 ENCOUNTER — Encounter: Payer: Self-pay | Admitting: Physical Therapy

## 2024-04-13 ENCOUNTER — Ambulatory Visit: Admitting: Physical Therapy

## 2024-04-13 DIAGNOSIS — M6281 Muscle weakness (generalized): Secondary | ICD-10-CM

## 2024-04-13 DIAGNOSIS — M79652 Pain in left thigh: Secondary | ICD-10-CM | POA: Diagnosis not present

## 2024-04-13 NOTE — Patient Instructions (Signed)
 Access Code: [email or web address] URL: [email or web address] Date: 04/13/2024 Prepared by: Elaine Daring  Exercises - Active Straight Leg Raise with Quad Set  - 1 x daily - 2 sets - 10 reps - Sidelying Hip Abduction  - 1 x daily - 2 sets - 10 reps - Sit to Stand Without Arm Support  - 1 x daily - 2 sets - 10 reps - Seated Long Arc Quad  - 1 x daily - 2 sets - 10 reps

## 2024-04-19 ENCOUNTER — Telehealth: Payer: Self-pay | Admitting: Family Medicine

## 2024-04-19 ENCOUNTER — Encounter: Payer: Self-pay | Admitting: Internal Medicine

## 2024-04-19 NOTE — Telephone Encounter (Signed)
 Forwarding to Dr. Joane to review and advise.   Per visit note 03/31/24:  Assessment and Plan: 53 y.o. female with left anterior hip pain and lateral thigh with numbness occurring about a month after bariatric surgery.   Lateral thigh numbness due to meralgia paresthetica.  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  This should improve with time without any specific treatment.  I have prescribed gabapentin  to use primarily at bedtime as needed.   Additionally she does have some anterior hip pain and pain with resisted hip flexion.  The hip flexor tendons do cross in this area and if she had no pressure or on the anterior hip to irritate the lateral femoral cutaneous nerve it is quite possible that she had some irritation of the hip flexor tendons.  Plan to refer to physical therapy.     Recheck back in about 6 weeks.

## 2024-04-19 NOTE — Telephone Encounter (Signed)
 Pt had 1 session of PT and has been doing home exercises/taking Gabapentin .  Pt knee/thigh feels worse, thinks she might have fluid on her knee. Does not seem to want to continue PT>  Need appt or do we have advice to give?

## 2024-04-21 NOTE — Telephone Encounter (Signed)
 Can we reach out to pt to assist with schedule appointment with Dr. Joane for eval?  Thanks!

## 2024-04-21 NOTE — Telephone Encounter (Signed)
 Scheduled

## 2024-04-21 NOTE — Telephone Encounter (Signed)
 I think she should probably schedule an appointment.

## 2024-04-23 ENCOUNTER — Other Ambulatory Visit: Payer: Self-pay | Admitting: Internal Medicine

## 2024-04-26 ENCOUNTER — Encounter: Admitting: Physical Therapy

## 2024-04-27 ENCOUNTER — Ambulatory Visit (INDEPENDENT_AMBULATORY_CARE_PROVIDER_SITE_OTHER)

## 2024-04-27 ENCOUNTER — Other Ambulatory Visit: Payer: Self-pay

## 2024-04-27 ENCOUNTER — Ambulatory Visit: Admitting: Family Medicine

## 2024-04-27 VITALS — BP 122/84 | Ht 63.0 in | Wt 234.0 lb

## 2024-04-27 DIAGNOSIS — G8929 Other chronic pain: Secondary | ICD-10-CM

## 2024-04-27 DIAGNOSIS — M25562 Pain in left knee: Secondary | ICD-10-CM

## 2024-04-27 NOTE — Progress Notes (Signed)
   LILLETTE Ileana Collet, PhD, LAT, ATC acting as a scribe for Artist Lloyd, MD.  [name] is a 53 y.o. female who presents to [facility] today for cont'd L leg pain. Pt was last seen by Dr. Lloyd on 03/31/24 and was prescribed gabapentin  and referred to PT, completing 1 visit.  Today, pt reports the HEP are not helping. She notes cont'd numbness along the L anterior thigh and all over the L knee, esp posteriorly. +swelling. Pain is worse trying to go up stairs. She c/o a feeling of tightness around the knee joint.  Dx testing: 03/30/24 L LE vasc US  05/13/20 L-spine MRI 03/05/20 L-spine XR  Pertinent review of systems: No fevers or chills  Relevant historical information: History of bariatric surgery about 2 months ago.   Exam:  BP 122/84   Ht 5' 3 (1.6 m)   Wt 234 lb (106.1 kg)   LMP  (LMP Unknown)   BMI 41.45 kg/m  General: Well Developed, well nourished, and in no acute distress.   MSK: Left knee mild effusion normal-appearing otherwise normal motion.  Intact strength.    Lab and Radiology Results  Procedure: Real-time Ultrasound Guided Injection of left knee joint superior lateral patella space Device: Philips Affiniti 50G/GE Logiq Images permanently stored and available for review in PACS Verbal informed consent obtained.  Discussed risks and benefits of procedure. Warned about infection, bleeding, hyperglycemia damage to structures among others. Patient expresses understanding and agreement Time-out conducted.   Noted no overlying erythema, induration, or other signs of local infection.   Skin prepped in a sterile fashion.   Local anesthesia: Topical Ethyl chloride.   With sterile technique and under real time ultrasound guidance: 40 mg of Kenalog  and 2 mL of Marcaine  injected into knee joint. Fluid seen entering the joint capsule.   Completed without difficulty   Pain immediately resolved suggesting accurate placement of the  medication.   Advised to call if fevers/chills, erythema, induration, drainage, or persistent bleeding.   Images permanently stored and available for review in the ultrasound unit.  Impression: Technically successful ultrasound guided injection.   X-ray images left knee obtained today personally and independently interpreted. Mild DJD.  No acute fractures. Await formal radiology review   Assessment and Plan: 53 y.o. female with chronic left knee pain with recent exacerbation.  Pain thought to be due to exacerbation of DJD.  Plan for steroid injection today. Consider MRI if not improving.  Consider physical therapy as well.  PDMP not reviewed this encounter. Orders Placed This Encounter  Procedures   US  LIMITED JOINT SPACE STRUCTURES LOW LEFT(NO LINKED CHARGES)    Reason for Exam (SYMPTOM  OR DIAGNOSIS REQUIRED):   left knee pain    Preferred imaging location?:   [facility] Knee AP/LAT W/[place] Left    Standing Status:   Future    Number of Occurrences:   1    Expiration Date:   05/27/2024    Reason for Exam (SYMPTOM  OR DIAGNOSIS REQUIRED):   left knee pain    Preferred imaging location?:   [facility]    Is patient pregnant?:   No   No orders of the defined types were placed in this encounter.    Discussed warning signs or symptoms. Please see discharge instructions. Patient expresses understanding.   The above documentation has been reviewed and is accurate and complete Artist Lloyd, M.D.

## 2024-04-27 NOTE — Patient Instructions (Addendum)
 Thank you for coming in today.   You received an injection today. Seek immediate medical attention if the joint becomes red, extremely painful, or is oozing fluid.   Please get an Xray today before you leave   Check back as needed

## 2024-05-02 ENCOUNTER — Other Ambulatory Visit: Payer: Self-pay | Admitting: Internal Medicine

## 2024-05-03 ENCOUNTER — Ambulatory Visit: Payer: Self-pay | Admitting: Family Medicine

## 2024-05-03 ENCOUNTER — Encounter: Admitting: Physical Therapy

## 2024-05-03 NOTE — Progress Notes (Signed)
 Left knee x-ray shows some swelling otherwise it looks okay without any visible arthritis on x-ray.

## 2024-05-10 ENCOUNTER — Encounter: Admitting: Physical Therapy

## 2024-05-17 ENCOUNTER — Encounter: Admitting: Physical Therapy

## 2024-05-18 ENCOUNTER — Encounter: Payer: Self-pay | Admitting: Family Medicine

## 2024-05-24 ENCOUNTER — Other Ambulatory Visit: Payer: Self-pay | Admitting: Family Medicine

## 2024-05-24 DIAGNOSIS — G8929 Other chronic pain: Secondary | ICD-10-CM

## 2024-05-24 NOTE — Telephone Encounter (Signed)
 Rx refill request approved per Dr. Zollie Pee orders.

## 2024-05-31 ENCOUNTER — Encounter: Payer: Self-pay | Admitting: Dietician

## 2024-05-31 ENCOUNTER — Encounter: Attending: General Surgery | Admitting: Dietician

## 2024-05-31 ENCOUNTER — Ambulatory Visit: Payer: Self-pay | Admitting: Cardiology

## 2024-05-31 ENCOUNTER — Telehealth: Payer: Self-pay

## 2024-05-31 VITALS — Ht 63.0 in | Wt 222.0 lb

## 2024-05-31 DIAGNOSIS — E1165 Type 2 diabetes mellitus with hyperglycemia: Secondary | ICD-10-CM | POA: Insufficient documentation

## 2024-05-31 DIAGNOSIS — E669 Obesity, unspecified: Secondary | ICD-10-CM | POA: Insufficient documentation

## 2024-05-31 NOTE — Progress Notes (Signed)
 Bariatric Nutrition Follow-Up Visit Medical Nutrition Therapy  Appt Start Time: 1728   End Time: 1800  Surgery date: 02/29/2024 Surgery type: RYGB  NUTRITION ASSESSMENT  Anthropometrics  Start weight at [facility]: 260.7 lbs (date: 08/17/2023)  Height: 63 in Weight today: 222.0 lb   Clinical   Medical hx: obesity, HTN Medications: potassium chloride , olmesartan  amlodipine  rizatriptan , alprazolam , hydroxyzine , carvedilol , linzess , pantoprazole , inhaler, prenatal vitamin Labs: see EMR Notable signs/symptoms: none noted Any previous deficiencies? No Bowel Habits: Every day to every other day no complaints   Body Composition Scale 03/15/2024 05/31/2024  Current Body Weight 254.3 219.8  Total Body Fat % 47.1 43.7  Visceral Fat 19 15  Fat-Free Mass % 52.8 56.2   Total Body Water  % 40.9 42.6  Muscle-Mass lbs 30.0 29.7  BMI 44.7 38.5  Body Fat Displacement           Torso  lbs 74.4 59.4         Left Leg  lbs 14.8 11.8         Right Leg  lbs 14.8 11.8         Left Arm  lbs 7.4 5.9         Right Arm  lbs 7.4 5.9    Lifestyle & Dietary Hx Pt states she had some leg pain, stating she received a Cortizone shot and is slowly increasing physical activity.   Estimated daily fluid intake: 58 oz Estimated daily protein intake: 70 g Supplements: multivitamin and calcium  Current average weekly physical activity: walking 2 days per week   24-Hr Dietary Recall First Meal: boiled egg and turkey sausage or scrambled eggs with spinach and turkey sausage or smoothie with protein powder, water , greek yogurt, blueberries, spinach Snack: 2 slices of turkey and cheese stick  Second Meal: turkey burger patty or chicken, green beans, sweet potatoes (1 Tbsp) or pinto beans Snack: yogurt (greek)  Third Meal: tuna, melba toast, green beans or spinach Snack: sometimes a mozzarella stick Beverages: water , coffee  Post-Op Goals/ Signs/ Symptoms Using straws: no Drinking while eating:  no Chewing/swallowing difficulties: no Changes in vision: no Changes to mood/headaches: no Hair loss/changes to skin/nails: no Difficulty focusing/concentrating: no Sweating: no Limb weakness: no Dizziness/lightheadedness: no Palpitations: no  Carbonated/caffeinated beverages: no N/V/D/C/Gas: uses Miralax in smoothie or coffee; gas if eating too fast Abdominal pain: no Dumping syndrome: no   NUTRITION DIAGNOSIS  Overweight/obesity ([place]-3.3) related to past poor dietary habits and physical inactivity as evidenced by completed bariatric surgery and following dietary guidelines for continued weight loss and healthy nutrition status.   NUTRITION INTERVENTION Nutrition counseling (C-1) and education (E-2) to facilitate bariatric surgery goals, including: Diet advancement to the standard prep plan The importance of consuming adequate calories as well as certain nutrients daily due to the body's need for essential vitamins, minerals, and fats The importance of daily physical activity and to reach a goal of at least 150 minutes of moderate to vigorous physical activity weekly (or as directed by their physician) due to benefits such as increased musculature and improved lab values The importance of intuitive eating specifically learning hunger-satiety cues and understanding the importance of learning a new body: The importance of mindful eating to avoid grazing behaviors   Goals Increase physical activity; resistance exercises once a week, for 30 minutes; walking 30 minutes 3-4 days per week  Handouts Provided Include  Standard Prep Plan Advancement Guide  Learning Style & Readiness for Change Teaching method utilized: Visual & Auditory  Demonstrated degree  of understanding via: Teach Back  Readiness Level: ready Barriers to learning/adherence to lifestyle change: nothing identified  RD's Notes for Next Visit Assess adherence to pt chosen goals  MONITORING & EVALUATION Dietary intake,  weekly physical activity, body weight.  Next Steps Patient is to follow-up in 3 months for 6 month post-op follow-up.

## 2024-05-31 NOTE — Telephone Encounter (Unsigned)
 Copied from CRM #[id number]. Topic: General - Other >> May 31, 2024  9:45 AM Alfonso ORN wrote: Reason for CRM: would like to know if pcp can extend handicap sticker for another 6 months . Still having issues with walking post surgery.  Call: [phone]

## 2024-06-01 NOTE — Telephone Encounter (Signed)
 Ok, thx

## 2024-06-02 NOTE — Telephone Encounter (Signed)
 I was able to speak with the pt and inform her that her form is ready for pick up.

## 2024-06-07 ENCOUNTER — Telehealth: Payer: Self-pay | Admitting: Dietician

## 2024-06-07 NOTE — Telephone Encounter (Signed)
 Pt called with questions about sugar free candy/gummies, and questions about meats. Dietitian advised patient to be aware of sugar alcohols in sugar free sweets/foods, which may cause gas and bloating. Pt's questions were answered to patients satisfaction and patient expressed understanding. Pt aware to call if she has any other questions concerns.

## 2024-06-14 ENCOUNTER — Encounter: Payer: Self-pay | Admitting: Internal Medicine

## 2024-06-14 ENCOUNTER — Ambulatory Visit: Admitting: Internal Medicine

## 2024-06-14 VITALS — BP 114/82 | HR 65 | Temp 98.3°F | Ht 63.0 in | Wt 217.0 lb

## 2024-06-14 DIAGNOSIS — Z23 Encounter for immunization: Secondary | ICD-10-CM | POA: Diagnosis not present

## 2024-06-14 DIAGNOSIS — Z6838 Body mass index (BMI) 38.0-38.9, adult: Secondary | ICD-10-CM

## 2024-06-14 DIAGNOSIS — M25552 Pain in left hip: Secondary | ICD-10-CM

## 2024-06-14 DIAGNOSIS — I10 Essential (primary) hypertension: Secondary | ICD-10-CM | POA: Diagnosis not present

## 2024-06-14 DIAGNOSIS — E1165 Type 2 diabetes mellitus with hyperglycemia: Secondary | ICD-10-CM

## 2024-06-14 DIAGNOSIS — E538 Deficiency of other specified B group vitamins: Secondary | ICD-10-CM

## 2024-06-14 DIAGNOSIS — R739 Hyperglycemia, unspecified: Secondary | ICD-10-CM

## 2024-06-14 DIAGNOSIS — G8929 Other chronic pain: Secondary | ICD-10-CM | POA: Diagnosis not present

## 2024-06-14 LAB — CBC WITH DIFFERENTIAL/PLATELET
Basophils Absolute: 0 K/uL (ref 0.0–0.1)
Basophils Relative: 0.6 % (ref 0.0–3.0)
Eosinophils Absolute: 0.1 K/uL (ref 0.0–0.7)
Eosinophils Relative: 3 % (ref 0.0–5.0)
HCT: 42.4 % (ref 36.0–46.0)
Hemoglobin: 13.5 g/dL (ref 12.0–15.0)
Lymphocytes Relative: 31.9 % (ref 12.0–46.0)
Lymphs Abs: 1.6 K/uL (ref 0.7–4.0)
MCHC: 31.9 g/dL (ref 30.0–36.0)
MCV: 79.7 fl (ref 78.0–100.0)
Monocytes Absolute: 0.5 K/uL (ref 0.1–1.0)
Monocytes Relative: 9.7 % (ref 3.0–12.0)
Neutro Abs: 2.7 K/uL (ref 1.4–7.7)
Neutrophils Relative %: 54.8 % (ref 43.0–77.0)
Platelets: 117 K/uL — ABNORMAL LOW (ref 150.0–400.0)
RBC: 5.32 Mil/uL — ABNORMAL HIGH (ref 3.87–5.11)
RDW: 17.4 % — ABNORMAL HIGH (ref 11.5–15.5)
WBC: 5 K/uL (ref 4.0–10.5)

## 2024-06-14 LAB — COMPREHENSIVE METABOLIC PANEL WITH GFR
ALT: 27 U/L (ref 0–35)
AST: 27 U/L (ref 0–37)
Albumin: 4.3 g/dL (ref 3.5–5.2)
Alkaline Phosphatase: 69 U/L (ref 39–117)
BUN: 11 mg/dL (ref 6–23)
CO2: 31 meq/L (ref 19–32)
Calcium: 10.1 mg/dL (ref 8.4–10.5)
Chloride: 103 meq/L (ref 96–112)
Creatinine, Ser: 0.66 mg/dL (ref 0.40–1.20)
GFR: 99.5 mL/min (ref >60.00)
Glucose, Bld: 84 mg/dL (ref 70–99)
Potassium: 4.2 meq/L (ref 3.5–5.1)
Sodium: 143 meq/L (ref 135–145)
Total Bilirubin: 0.4 mg/dL (ref 0.2–1.2)
Total Protein: 7.7 g/dL (ref 6.0–8.3)

## 2024-06-14 LAB — HEMOGLOBIN A1C: Hgb A1c MFr Bld: 5.6 % (ref 4.6–6.5)

## 2024-06-14 LAB — VITAMIN B12: Vitamin B-12: 582 pg/mL (ref 211–911)

## 2024-06-14 LAB — VITAMIN D 25 HYDROXY (VIT D DEFICIENCY, FRACTURES): VITD: 39.24 ng/mL (ref 30.00–100.00)

## 2024-06-14 LAB — URIC ACID: Uric Acid, Serum: 4.6 mg/dL (ref 2.4–7.0)

## 2024-06-14 MED ORDER — ALPRAZOLAM 1 MG PO TABS: 1.0000 mg | ORAL_TABLET | Freq: Two times a day (BID) | ORAL | 1 refills | Status: AC | PRN

## 2024-06-14 MED ORDER — GABAPENTIN 300 MG PO CAPS: 300.0000 mg | ORAL_CAPSULE | Freq: Every day | ORAL | 1 refills | Status: AC

## 2024-06-14 NOTE — Patient Instructions (Signed)
 Blue-Emu cream -- use 2-3 times a day ? ?

## 2024-06-14 NOTE — Progress Notes (Signed)
 Subjective:  Patient ID: [name], female    DOB: 07-Oct-1970  Age: 53 y.o. MRN: [id number]  CC: Medical Management of Chronic Issues (2 Month follow up. Patient still having some swelling in the left knee. Currently treating with ice and elevation. Has not been able to follow up with physical therapy)   HPI [name] presents for anxiety, obesity - losing wt post-op C/o L knee pain - better after injection  Outpatient Medications Prior to Visit  Medication Sig Dispense Refill   Albuterol -Budesonide  (AIRSUPRA ) 90-80 MCG/ACT AERO Inhale 2 Inhalations into the lungs every 4 (four) hours as needed. 10 g 5   carvedilol  (COREG ) 6.25 MG tablet TAKE 1 TABLET BY MOUTH 2 TIMES DAILY WITH A MEAL. 180 tablet 3   cholecalciferol  (VITAMIN D3) 25 MCG (1000 UNIT) tablet Take 1,000 Units by mouth daily.     cyclobenzaprine  (FLEXERIL ) 10 MG tablet TAKE 1 TABLET BY MOUTH EVERYDAY AT BEDTIME 90 tablet 3   enoxaparin  (LOVENOX ) 40 MG/0.4ML injection Inject 0.4 mLs (40 mg total) into the skin every 12 (twelve) hours. 28 mL 0   furosemide  (LASIX ) 40 MG tablet TAKE 1 TABLET BY MOUTH DAILY AS NEEDED 90 tablet 1   hydrOXYzine  (VISTARIL ) 25 MG capsule TAKE 1-2 CAPSULES BY MOUTH AT BEDTIME AS NEEDED (Patient taking differently: Take 25 mg by mouth at bedtime.) 180 capsule 1   linaclotide  (LINZESS ) 290 MCG CAPS capsule Take 1 capsule (290 mcg total) by mouth daily before breakfast. 30 capsule 3   norethindrone-ethinyl estradiol (FYAVOLV) 1-5 MG-MCG TABS tablet Take 1 tablet by mouth daily.     Olmesartan -amLODIPine -HCTZ 40-5-12.5 MG TABS TAKE 1 TABLET BY MOUTH EVERY DAY 90 tablet 2   pantoprazole  (PROTONIX ) 40 MG tablet Take 1 tablet (40 mg total) by mouth daily. 90 tablet 1   potassium chloride  SA (KLOR-CON  M) 20 MEQ tablet TAKE 1 TABLET BY MOUTH TWICE A DAY (Patient taking differently: Take 20 mEq by mouth daily.) 180 tablet 1   Prenatal Vit-Fe Fumarate-FA (M-NATAL PLUS) 27-1 MG TABS TAKE 1 TABLET BY  MOUTH EVERY DAY 90 tablet 1   rizatriptan  (MAXALT ) 10 MG tablet TAKE 1 TABLET BY MOUTH ONCE AS NEEDED FOR UP TO 1 DOSE FOR MIGRAINE. MAY REPEAT IN 2 HOURS 12 tablet 5   thiamine (VITAMIN B-1) 100 MG tablet Take 100 mg by mouth daily.     ALPRAZolam  (XANAX ) 1 MG tablet TAKE 1 TABLET (1 MG TOTAL) BY MOUTH 3 TIMES/DAY AS NEEDED-BETWEEN MEALS & BEDTIME FOR ANXIETY (Patient taking differently: Take 1 mg by mouth at bedtime.) 90 tablet 1   gabapentin  (NEURONTIN ) 300 MG capsule TAKE 1 CAPSULE BY MOUTH 3 TIMES DAILY AS NEEDED. 90 capsule 1   ondansetron  (ZOFRAN -ODT) 4 MG disintegrating tablet Dissolve 1 tablet (4 mg total) by mouth every 6 (six) hours as needed for nausea or vomiting. (Patient not taking: Reported on 06/14/2024) 20 tablet 0   oxyCODONE  (OXY IR/ROXICODONE ) 5 MG immediate release tablet Take 1 tablet (5 mg total) by mouth every 6 (six) hours as needed for breakthrough pain or severe pain (pain score 7-10). (Patient not taking: Reported on 06/14/2024) 5 tablet 0   No facility-administered medications prior to visit.    ROS: Review of Systems  Constitutional:  Negative for activity change, appetite change, chills, fatigue and unexpected weight change.  HENT:  Negative for congestion, mouth sores and sinus pressure.   Eyes:  Negative for visual disturbance.  Respiratory:  Negative for cough and chest tightness.  Gastrointestinal:  Negative for abdominal pain and nausea.  Genitourinary:  Negative for difficulty urinating, frequency and vaginal pain.  Musculoskeletal:  Positive for arthralgias. Negative for back pain and gait problem.  Skin:  Negative for pallor and rash.  Neurological:  Negative for dizziness, tremors, weakness, numbness and headaches.  Psychiatric/Behavioral:  Negative for confusion and sleep disturbance.     Objective:  BP 114/82   Pulse 65   Temp 98.3 F (36.8 C)   Ht 5' 3 (1.6 m)   Wt 217 lb (98.4 kg)   LMP  (LMP Unknown)   SpO2 99%   BMI 38.44 kg/m   BP  Readings from Last 3 Encounters:  06/14/24 114/82  04/27/24 122/84  03/31/24 134/86    Wt Readings from Last 3 Encounters:  06/14/24 217 lb (98.4 kg)  05/31/24 222 lb (100.7 kg)  04/27/24 234 lb (106.1 kg)    Physical Exam Constitutional:      General: She is not in acute distress.    Appearance: She is well-developed. She is obese.  HENT:     Head: Normocephalic.     Right Ear: External ear normal.     Left Ear: External ear normal.     Nose: Nose normal.  Eyes:     General:        Right eye: No discharge.        Left eye: No discharge.     Conjunctiva/sclera: Conjunctivae normal.     Pupils: Pupils are equal, round, and reactive to light.  Neck:     Thyroid : No thyromegaly.     Vascular: No JVD.     Trachea: No tracheal deviation.  Cardiovascular:     Rate and Rhythm: Normal rate and regular rhythm.     Heart sounds: Normal heart sounds.  Pulmonary:     Effort: No respiratory distress.     Breath sounds: No stridor. No wheezing.  Abdominal:     General: Bowel sounds are normal. There is no distension.     Palpations: Abdomen is soft. There is no mass.     Tenderness: There is no abdominal tenderness. There is no guarding or rebound.  Musculoskeletal:        General: Tenderness present.     Cervical back: Normal range of motion and neck supple. No rigidity.     Right lower leg: No edema.     Left lower leg: No edema.  Lymphadenopathy:     Cervical: No cervical adenopathy.  Skin:    Findings: No erythema or rash.  Neurological:     Cranial Nerves: No cranial nerve deficit.     Motor: No abnormal muscle tone.     Coordination: Coordination normal.     Deep Tendon Reflexes: Reflexes normal.  Psychiatric:        Behavior: Behavior normal.        Thought Content: Thought content normal.        Judgment: Judgment normal.   L knee is sensitive  Lab Results  Component Value Date   WBC 5.5 03/14/2024   HGB 12.0 03/14/2024   HCT 37.3 03/14/2024   PLT 145.0  (L) 03/14/2024   GLUCOSE 76 03/14/2024   CHOL 170 05/13/2023   TRIG 85.0 05/13/2023   HDL 53.80 05/13/2023   LDLCALC 99 05/13/2023   ALT 41 (H) 03/14/2024   AST 26 03/14/2024   NA 141 03/14/2024   K 4.0 03/14/2024   CL 102 03/14/2024   CREATININE 0.57 03/14/2024  BUN 9 03/14/2024   CO2 29 03/14/2024   TSH 0.63 03/20/2023   INR 0.9 RATIO 03/15/2007   HGBA1C 6.2 05/13/2023    DG HIP UNILAT W OR W/O PELVIS 2-3 VIEWS LEFT Result Date: 04/05/2024 CLINICAL DATA:  LEFT hip pain and numbness since gastric bypass and hernia repair surgery on 02/29/2024 EXAM: DG HIP (WITH OR WITHOUT PELVIS) 2-3V LEFT COMPARISON:  CT abdomen pelvis 01/12/2022 FINDINGS: There is no evidence of hip fracture or dislocation. There is no evidence of arthropathy or other focal bone abnormality. RIGHT pelvic calcification consistent with calcified fibroid. IMPRESSION: No significant radiographic abnormality of the LEFT hip. Electronically Signed   By: Aliene Lloyd M.D.   On: 04/05/2024 10:06   VAS US  LOWER EXTREMITY VENOUS (DVT) Result Date: 03/30/2024  Lower Venous DVT Study Patient Name:  Lelania Bia  Date of Exam:   03/30/2024 Medical Rec #: [id number]         Accession #:    [id number] Date of Birth: 1970-09-14          Patient Gender: F Patient Age:   7 years Exam Location:  [address] Procedure:      VAS US  LOWER EXTREMITY VENOUS (DVT) Referring Phys: LYNWOOD RUSH --------------------------------------------------------------------------------  Indications: Swelling, and Pain.  Performing Technologist: Duwaine Hives RVS  Examination Guidelines: A complete evaluation includes B-mode imaging, spectral Doppler, color Doppler, and power Doppler as needed of all accessible portions of each vessel. Bilateral testing is considered an integral part of a complete examination. Limited examinations for reoccurring indications may be performed as noted. The reflux portion of the exam is performed with the patient in reverse  Trendelenburg.  +-----+---------------+---------+-----------+----------+--------------+ RIGHTCompressibilityPhasicitySpontaneityPropertiesThrombus Aging +-----+---------------+---------+-----------+----------+--------------+ CFV  Full           Yes      Yes                                 +-----+---------------+---------+-----------+----------+--------------+   +---------+---------------+---------+-----------+----------+--------------+ LEFT     CompressibilityPhasicitySpontaneityPropertiesThrombus Aging +---------+---------------+---------+-----------+----------+--------------+ CFV      Full           Yes      Yes                                 +---------+---------------+---------+-----------+----------+--------------+ SFJ      Full                                                        +---------+---------------+---------+-----------+----------+--------------+ FV Prox  Full                                                        +---------+---------------+---------+-----------+----------+--------------+ FV Mid   Full                                                        +---------+---------------+---------+-----------+----------+--------------+ FV DistalFull                                                        +---------+---------------+---------+-----------+----------+--------------+  PFV      Full                                                        +---------+---------------+---------+-----------+----------+--------------+ POP      Full           Yes      Yes                                 +---------+---------------+---------+-----------+----------+--------------+ PTV      Full                                                        +---------+---------------+---------+-----------+----------+--------------+ PERO     Full                                                         +---------+---------------+---------+-----------+----------+--------------+     Summary: RIGHT: - No evidence of common femoral vein obstruction.   LEFT: - There is no evidence of deep vein thrombosis in the lower extremity.  - No cystic structure found in the popliteal fossa.  *See table(s) above for measurements and observations. Electronically signed by Penne Colorado MD on 03/30/2024 at 11:58:34 AM.    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 - Dr Tanda  Doing well      Type 2 diabetes mellitus with hyperglycemia (HCC)   Relevant Orders   CBC with Differential/Platelet   Comprehensive metabolic panel with GFR   Uric acid   Vitamin B12   VITAMIN D  25 Hydroxy (Vit-D Deficiency, Fractures)   Hemoglobin A1c   Other Visit Diagnoses       Chronic hip pain, left       Relevant Medications   gabapentin  (NEURONTIN ) 300 MG capsule   Other Relevant Orders   VITAMIN D  25 Hydroxy (Vit-D Deficiency, Fractures)         Meds ordered this encounter  Medications   ALPRAZolam  (XANAX ) 1 MG tablet    Sig: Take 1 tablet (1 mg total) by mouth 3 times/day as needed-between meals & bedtime for anxiety.    Dispense:  90 tablet    Refill:  1   gabapentin  (NEURONTIN ) 300 MG capsule    Sig: Take 1 capsule (300 mg total) by mouth at bedtime.    Dispense:  90 capsule    Refill:  1      Follow-up: Return in about 3 months (around 09/14/2024) for a follow-up visit.  Marolyn Noel, MD

## 2024-06-14 NOTE — Assessment & Plan Note (Signed)
 S/p gastric bypass and hernia surgery 02/29/24 - Dr Tanda  Doing well

## 2024-06-14 NOTE — Addendum Note (Signed)
 Addended byBETHA LUCETTA CLEATRICE LELON on: 06/14/2024 04:29 PM   Modules accepted: Orders

## 2024-06-20 ENCOUNTER — Ambulatory Visit: Payer: Self-pay | Admitting: Internal Medicine

## 2024-07-04 ENCOUNTER — Telehealth: Payer: Self-pay | Admitting: Dietician

## 2024-07-04 NOTE — Telephone Encounter (Signed)
 Returned patient's call. Pt had questions about lab results from her doctor, stating her platelets are low. Pt states she is taking a bariatric multivitamin from recommended companies and following the bariatric food patterns. Pt states her doctor is tracking it stating her doctor said he would keep an eye on her platelets. Pt also had questions about gummies. Pt is scheduled for a visit in February. Patient's questions were answered to her satisfaction and patient expressed understanding. Patient is aware to call if she has any other questions or concerns.

## 2024-07-05 ENCOUNTER — Telehealth: Payer: Self-pay | Admitting: *Deleted

## 2024-07-05 NOTE — Telephone Encounter (Signed)
 Received on (07/01/24) via of fax Medical Record Request from [facility].  Requesting most recent medical documentation pertaining to the patient's breast surgery and diagnosis.    Faxed to [facility] patient's most recent office note regarding recent breast surgery.//AR/CMA

## 2024-07-08 ENCOUNTER — Telehealth: Payer: Self-pay | Admitting: *Deleted

## 2024-07-08 NOTE — Telephone Encounter (Signed)
 Scanned recent office notes to [facility].//AR/CMA

## 2024-07-08 NOTE — Telephone Encounter (Signed)
 Received on (07/01/2024) via of fax Medical Record Request (Breast Surgery) from [facility].  Requesting present medical records.  Medical records faxed to [facility].//AR/CMA

## 2024-07-23 ENCOUNTER — Other Ambulatory Visit: Payer: Self-pay | Admitting: Internal Medicine

## 2024-07-27 ENCOUNTER — Ambulatory Visit: Payer: Self-pay

## 2024-07-27 NOTE — Telephone Encounter (Signed)
 Pt requesting response via MyChart.   FYI Only or Action Required?: Action required by provider: clinical question for provider. Pt is out of town.  Patient was last seen in primary care on 06/14/2024 by Plotnikov, Karlynn GAILS, MD.  Called Nurse Triage reporting Fatigue.  Symptoms began several weeks ago.  Interventions attempted: Nothing.  Symptoms are: gradually worsening.  Triage Disposition: Discuss With PCP and Callback by Nurse Today (overriding See Physician Within 24 Hours)  Patient/caregiver understands and will follow disposition?:   Reason for Disposition  [1] MODERATE weakness (e.g., interferes with work, school, normal activities) AND [2] persists > 3 days  Answer Assessment - Initial Assessment Questions Pt is asking for recommendations on supplements. Pt discontinued Vitamin D , is asking if that can be ordered if she is supposed to continue it.   Or is asking if she should spread out her PM medications. Pt takes xanax , progesterone, gabapentin  and hydroxyzine  at night, but believed fatigue started after those. Denied hematochezia, melena or hematuria. Is also asking if she should continue B1.   1. DESCRIPTION: Describe how you are feeling.     Pt reports feeling tired despite getting sufficient sleep. States she has noticed some bruising since being told that her platelets were low. Has increased green, leafy vegetables. Worse last 3 weeks. Does work 12-13 hour days last 3 weeks.  2. SEVERITY: How bad is it?  Can you stand and walk?     Yes  3. ONSET: When did these symptoms begin? (e.g., hours, days, weeks, months)     3 weeks  4. CAUSE: What do you think is causing the weakness or fatigue? (e.g., not drinking enough fluids, medical problem, trouble sleeping)     Pt was asking if r/t platelets  5. NEW MEDICINES:  Have you started on any new medicines recently? (e.g., opioid pain medicines, benzodiazepines, muscle relaxants, antidepressants,  antihistamines, neuroleptics, beta blockers)     Denies  6. OTHER SYMPTOMS: Do you have any other symptoms? (e.g., chest pain, fever, cough, SOB, vomiting, diarrhea, bleeding, other areas of pain)     Denies  Protocols used: Weakness (Generalized) and Fatigue-A-AH   Message from Franky GRADE sent at 07/27/2024 10:13 AM EST  Reason for Triage: Patient received a call for lab results and was told her platelet were low. She has been experiencing fatigue/tiredness recently. She sleeps 7-8 hours through the night but is always constantly tired. No other symptoms.

## 2024-07-29 NOTE — Telephone Encounter (Signed)
 Called and spoke with patient. Provided her with response from covering provider. Patient expressed understanding and noted she would attempt the combination of xanax  and gabapentin  first and if that didn't work she would drop the xanax  in favor of the hydroxyzine . Patient will reach out with updates throughout the week.

## 2024-08-02 ENCOUNTER — Telehealth: Payer: Self-pay | Admitting: *Deleted

## 2024-08-02 NOTE — Telephone Encounter (Signed)
 Received DME Standard Written Order from [facility].  Requesting signature and return.  Given to provider to sign.  DME Standard Written Order signed and faxed back to [facility].  Confirmation received and copy scanned into the chart.//AR/CMA

## 2024-08-08 ENCOUNTER — Ambulatory Visit: Payer: Self-pay | Admitting: *Deleted

## 2024-08-08 NOTE — Telephone Encounter (Signed)
 FYI Only or Action Required?: FYI only for provider: appointment scheduled on 08/10/23.  Patient was last seen in primary care on 06/14/2024 by Plotnikov, Karlynn GAILS, MD.  Called Nurse Triage reporting Hypertension.  Symptoms began several days ago.  Interventions attempted: as needed 1/2 tab: Olmesartan -amLODIPine -HCTZ 40-5-12.5 MG TABS .  Symptoms are: gradually improving.  Triage Disposition: See Physician Within 24 Hours  Patient/caregiver understands and will follow disposition?: Yes    Copied from CRM #[id number]. Topic: Clinical - Red Word Triage >> Aug 08, 2024 10:03 AM Shanda MATSU wrote: Red Word that prompted transfer to Nurse Triage: Patient reporting a BP reading this morning of  156/118. >> Aug 08, 2024  1:10 PM Hadassah PARAS wrote: Pt disconnected call earlier due to meeting. Transferring pt to NT Reason for Disposition  Systolic BP >= 180 OR Diastolic >= 110  Answer Assessment - Initial Assessment Questions 1. BLOOD PRESSURE: What is your blood pressure? Did you take at least two measurements 5 minutes apart?     156/118-10:00, 178/110-1:30 2. ONSET: When did you take your blood pressure?     today 3. HOW: How did you take your blood pressure? (e.g., automatic home BP monitor, visiting nurse)     manually 4. HISTORY: Do you have a history of high blood pressure?     yes 5. MEDICINES: Are you taking any medicines for blood pressure? Have you missed any doses recently?     Carvedilol  , PRN: Olmesartan -amLODIPine -HCTZ 40-5-12.5 MG TABS- patient took half tab over weekend due to elevated BP 6. OTHER SYMPTOMS: Do you have any symptoms? (e.g., blurred vision, chest pain, difficulty breathing, headache, weakness)     Slight headache- has improved since this morning.  Patient is at work-she will take 1/2 tab of olmesartan  and monitor BP- if comes down- keep appointment tomorrow- if not UC will be next step.  Protocols used: Blood Pressure - High-A-AH .

## 2024-08-09 ENCOUNTER — Ambulatory Visit: Admitting: Internal Medicine

## 2024-08-09 ENCOUNTER — Other Ambulatory Visit: Payer: Self-pay | Admitting: Internal Medicine

## 2024-09-02 ENCOUNTER — Other Ambulatory Visit: Payer: Self-pay | Admitting: Internal Medicine

## 2024-09-13 ENCOUNTER — Encounter

## 2024-09-14 ENCOUNTER — Ambulatory Visit: Admitting: Internal Medicine
